# Patient Record
Sex: Female | Born: 1970 | Race: Black or African American | Hispanic: No | Marital: Married | Smoking: Never smoker
Health system: Southern US, Community
[De-identification: ages and names within clinical notes are randomized; demographics above are authoritative.]

## PROBLEM LIST (undated history)

## (undated) ENCOUNTER — Emergency Department (HOSPITAL_COMMUNITY): Payer: Managed Care, Other (non HMO)

## (undated) DIAGNOSIS — N921 Excessive and frequent menstruation with irregular cycle: Secondary | ICD-10-CM

## (undated) DIAGNOSIS — E119 Type 2 diabetes mellitus without complications: Secondary | ICD-10-CM

## (undated) DIAGNOSIS — I10 Essential (primary) hypertension: Secondary | ICD-10-CM

## (undated) DIAGNOSIS — I5189 Other ill-defined heart diseases: Secondary | ICD-10-CM

## (undated) DIAGNOSIS — I639 Cerebral infarction, unspecified: Secondary | ICD-10-CM

## (undated) DIAGNOSIS — D849 Immunodeficiency, unspecified: Secondary | ICD-10-CM

## (undated) HISTORY — DX: Excessive and frequent menstruation with irregular cycle: N92.1

## (undated) HISTORY — PX: NO PAST SURGERIES: SHX2092

---

## 2002-08-11 ENCOUNTER — Encounter: Admission: RE | Admit: 2002-08-11 | Discharge: 2002-11-09 | Payer: Self-pay | Admitting: Endocrinology

## 2002-11-13 ENCOUNTER — Emergency Department (HOSPITAL_COMMUNITY): Admission: EM | Admit: 2002-11-13 | Discharge: 2002-11-13 | Payer: Self-pay | Admitting: Emergency Medicine

## 2002-11-13 ENCOUNTER — Encounter: Payer: Self-pay | Admitting: Emergency Medicine

## 2004-08-06 ENCOUNTER — Encounter: Admission: RE | Admit: 2004-08-06 | Discharge: 2004-08-06 | Payer: Self-pay | Admitting: Internal Medicine

## 2004-08-31 ENCOUNTER — Other Ambulatory Visit: Admission: RE | Admit: 2004-08-31 | Discharge: 2004-08-31 | Payer: Self-pay | Admitting: Obstetrics and Gynecology

## 2007-04-21 ENCOUNTER — Ambulatory Visit (HOSPITAL_COMMUNITY): Admission: RE | Admit: 2007-04-21 | Discharge: 2007-04-21 | Payer: Self-pay | Admitting: Obstetrics and Gynecology

## 2008-08-08 ENCOUNTER — Ambulatory Visit (HOSPITAL_COMMUNITY): Admission: RE | Admit: 2008-08-08 | Discharge: 2008-08-08 | Payer: Self-pay | Admitting: Internal Medicine

## 2008-11-23 ENCOUNTER — Emergency Department (HOSPITAL_COMMUNITY): Admission: EM | Admit: 2008-11-23 | Discharge: 2008-11-23 | Payer: Self-pay | Admitting: Emergency Medicine

## 2011-12-12 ENCOUNTER — Emergency Department (HOSPITAL_BASED_OUTPATIENT_CLINIC_OR_DEPARTMENT_OTHER)
Admission: EM | Admit: 2011-12-12 | Discharge: 2011-12-12 | Disposition: A | Discharge status: Home / Self Care | Payer: Managed Care, Other (non HMO) | Attending: Emergency Medicine | Admitting: Emergency Medicine

## 2011-12-12 ENCOUNTER — Encounter (HOSPITAL_BASED_OUTPATIENT_CLINIC_OR_DEPARTMENT_OTHER): Payer: Self-pay | Admitting: *Deleted

## 2011-12-12 DIAGNOSIS — R739 Hyperglycemia, unspecified: Secondary | ICD-10-CM

## 2011-12-12 DIAGNOSIS — R7309 Other abnormal glucose: Secondary | ICD-10-CM | POA: Insufficient documentation

## 2011-12-12 DIAGNOSIS — E119 Type 2 diabetes mellitus without complications: Secondary | ICD-10-CM | POA: Insufficient documentation

## 2011-12-12 DIAGNOSIS — I10 Essential (primary) hypertension: Secondary | ICD-10-CM | POA: Insufficient documentation

## 2011-12-12 HISTORY — DX: Essential (primary) hypertension: I10

## 2011-12-12 HISTORY — DX: Type 2 diabetes mellitus without complications: E11.9

## 2011-12-12 LAB — URINALYSIS, ROUTINE W REFLEX MICROSCOPIC
Bilirubin Urine: NEGATIVE
Glucose, UA: >1000 mg/dL — AB
Hgb urine dipstick: NEGATIVE
Ketones, ur: NEGATIVE mg/dL
Nitrite: NEGATIVE
Protein, ur: NEGATIVE mg/dL
Specific Gravity, Urine: 1.015 (ref 1.005–1.030)
Urobilinogen, UA: 0.2 mg/dL (ref 0.0–1.0)
pH: 7 (ref 5.0–8.0)

## 2011-12-12 LAB — URINE MICROSCOPIC-ADD ON

## 2011-12-12 LAB — BASIC METABOLIC PANEL
BUN: 14 mg/dL (ref 6–23)
Chloride: 95 mEq/L — ABNORMAL LOW (ref 96–112)
Creatinine, Ser: 0.9 mg/dL (ref 0.50–1.10)
Glucose, Bld: 417 mg/dL — ABNORMAL HIGH (ref 70–99)
Potassium: 3.5 mEq/L (ref 3.5–5.1)

## 2011-12-12 MED ORDER — INSULIN ASPART PROT & ASPART (70-30 MIX) 100 UNIT/ML ~~LOC~~ SUSP
SUBCUTANEOUS | Status: AC
  Filled 2011-12-12: qty 3

## 2011-12-12 MED ORDER — METFORMIN HCL 1000 MG PO TABS: 1000.0000 mg | ORAL_TABLET | Freq: Two times a day (BID) | ORAL | Status: DC

## 2011-12-12 MED ORDER — INSULIN ASPART PROT & ASPART (70-30 MIX) 100 UNIT/ML ~~LOC~~ SUSP
7.0000 [IU] | Freq: Once | SUBCUTANEOUS | Status: AC
  Administered 2011-12-12: 7 [IU] via SUBCUTANEOUS

## 2011-12-12 MED ORDER — LISINOPRIL 20 MG PO TABS: 20.0000 mg | ORAL_TABLET | Freq: Every day | ORAL | Status: DC

## 2011-12-12 NOTE — ED Provider Notes (Signed)
History     CSN: [id number]  Arrival date & time 12/12/11  1024   First MD Initiated Contact with Patient 12/12/11 1124      Chief Complaint  Patient presents with  . Hypertension    (Consider location/radiation/quality/duration/timing/severity/associated sxs/prior treatment) Patient is a 41 y.o. female presenting with hypertension. The history is provided by the patient.  Hypertension  She has a history of hypertension and diabetes but stopped taking medications over a year ago. She has not been monitoring her blood pressure her blood sugar since then. She had her blood pressure taken at a routine screening today and it was 230/120. She denies headache or visual change or tinnitus.  Past Medical History  Diagnosis Date  . Diabetes mellitus without complication   . Hypertension     History reviewed. No pertinent past surgical history.  History reviewed. No pertinent family history.  History  Substance Use Topics  . Smoking status: Never Smoker   . Smokeless tobacco: Not on file  . Alcohol Use: No    OB History    Grav Para Term Preterm Abortions TAB SAB Ect Mult Living                  Review of Systems  All other systems reviewed and are negative.    Allergies  Review of patient's allergies indicates no known allergies.  Home Medications  No current outpatient prescriptions on file.  BP 200/138  Pulse 86  Temp 98.5 F (36.9 C) (Oral)  Resp 20  SpO2 100%  Physical Exam  Nursing note and vitals reviewed. 41 year old female, resting comfortably and in no acute distress. Vital signs are significant for severe hypertension with blood pressure 200/138. Oxygen saturation is 100%, which is normal. Head is normocephalic and atraumatic. PERRLA, EOMI. Oropharynx is clear. Fundi show no hemorrhage, exudate, or papilledema. Neck is nontender and supple without adenopathy or JVD. Back is nontender and there is no CVA tenderness. Lungs are clear without rales,  wheezes, or rhonchi. Chest is nontender. Heart has regular rate and rhythm. There is a 2/6 systolic ejection murmur present along the left sternal border. Abdomen is soft, flat, nontender without masses or hepatosplenomegaly and peristalsis is normoactive. Extremities have no cyanosis or edema, full range of motion is present. Skin is warm and dry without rash. Neurologic: Mental status is normal, cranial nerves are intact, there are no motor or sensory deficits.   ED Course  Procedures (including critical care time)  Results for orders placed during the hospital encounter of 12/12/11  URINALYSIS, ROUTINE W REFLEX MICROSCOPIC      Component Value Range   Color, Urine YELLOW  YELLOW   APPearance CLEAR  CLEAR   Specific Gravity, Urine 1.015  1.005 - 1.030   pH 7.0  5.0 - 8.0   Glucose, UA >1000 (*) NEGATIVE mg/dL   Hgb urine dipstick NEGATIVE  NEGATIVE   Bilirubin Urine NEGATIVE  NEGATIVE   Ketones, ur NEGATIVE  NEGATIVE mg/dL   Protein, ur NEGATIVE  NEGATIVE mg/dL   Urobilinogen, UA 0.2  0.0 - 1.0 mg/dL   Nitrite NEGATIVE  NEGATIVE   Leukocytes, UA SMALL (*) NEGATIVE  BASIC METABOLIC PANEL      Component Value Range   Sodium 132 (*) 135 - 145 mEq/L   Potassium 3.5  3.5 - 5.1 mEq/L   Chloride 95 (*) 96 - 112 mEq/L   CO2 23  19 - 32 mEq/L   Glucose, Bld 417 (*) 70 -  99 mg/dL   BUN 14  6 - 23 mg/dL   Creatinine, Ser 1.61  0.50 - 1.10 mg/dL   Calcium 9.4  8.4 - 09.6 mg/dL   GFR calc non Af Amer 77 (*) >90 mL/min   GFR calc Af Amer 89 (*) >90 mL/min  URINE MICROSCOPIC-ADD ON      Component Value Range   Squamous Epithelial / LPF RARE  RARE   WBC, UA 7-10  <3 WBC/hpf   RBC / HPF 3-6  <3 RBC/hpf   Bacteria, UA FEW (*) RARE   Urine-Other MUCOUS PRESENT      1. Hypertension   2. Hyperglycemia       MDM  Severe hypertension. Basic metabolic panel and urinalysis will be obtained to screen for signs of endorgan damage and I anticipate sending her home on lisinopril. If blood  sugar is significantly elevated, she'll also be started on oral hypoglycemics.  Blood sugars come back at over 400. She is given a dose of insulin and will be sent home with prescriptions for metformin and lisinopril.        Dione Booze, MD 12/12/11 1331

## 2011-12-12 NOTE — ED Notes (Signed)
Having health screening at work they sent her here after a blood pressure reading of 230/128 pt denies any pain or discomfort

## 2011-12-12 NOTE — ED Notes (Signed)
Pt states she has been on medication in the past for hypertension but decided she wanted " to try to something more natural" pt continues to deny headache blurred vision or any abnormal symptoms states she feels fine

## 2011-12-13 LAB — URINE CULTURE

## 2012-05-13 ENCOUNTER — Encounter: Payer: Self-pay | Admitting: Obstetrics and Gynecology

## 2012-05-13 DIAGNOSIS — I10 Essential (primary) hypertension: Secondary | ICD-10-CM | POA: Insufficient documentation

## 2012-05-13 DIAGNOSIS — E119 Type 2 diabetes mellitus without complications: Secondary | ICD-10-CM | POA: Insufficient documentation

## 2013-02-11 ENCOUNTER — Other Ambulatory Visit: Payer: Self-pay | Admitting: Obstetrics and Gynecology

## 2013-02-11 DIAGNOSIS — Z1231 Encounter for screening mammogram for malignant neoplasm of breast: Secondary | ICD-10-CM

## 2013-03-04 ENCOUNTER — Ambulatory Visit: Payer: Managed Care, Other (non HMO)

## 2014-06-22 ENCOUNTER — Inpatient Hospital Stay (HOSPITAL_COMMUNITY)
Admission: EM | Admit: 2014-06-22 | Discharge: 2014-06-22 | DRG: 066 | Disposition: A | Discharge status: Other Acute Inpatient Hospital | Payer: 59 | Attending: Neurology | Admitting: Neurology

## 2014-06-22 ENCOUNTER — Emergency Department (HOSPITAL_COMMUNITY): Admission: EM | Admit: 2014-06-22 | Payer: Managed Care, Other (non HMO) | Source: Home / Self Care

## 2014-06-22 ENCOUNTER — Emergency Department (HOSPITAL_COMMUNITY): Payer: 59

## 2014-06-22 ENCOUNTER — Encounter (HOSPITAL_COMMUNITY): Payer: Self-pay | Admitting: *Deleted

## 2014-06-22 ENCOUNTER — Encounter (HOSPITAL_COMMUNITY): Payer: Self-pay | Admitting: Neurology

## 2014-06-22 ENCOUNTER — Inpatient Hospital Stay (HOSPITAL_COMMUNITY)
Admission: EM | Admit: 2014-06-22 | Discharge: 2014-06-24 | Disposition: A | Discharge status: Home / Self Care | Payer: 59 | Source: Home / Self Care | Attending: Neurology | Admitting: Neurology

## 2014-06-22 DIAGNOSIS — I10 Essential (primary) hypertension: Secondary | ICD-10-CM | POA: Diagnosis present

## 2014-06-22 DIAGNOSIS — I629 Nontraumatic intracranial hemorrhage, unspecified: Secondary | ICD-10-CM | POA: Diagnosis not present

## 2014-06-22 DIAGNOSIS — E119 Type 2 diabetes mellitus without complications: Secondary | ICD-10-CM | POA: Diagnosis present

## 2014-06-22 DIAGNOSIS — R531 Weakness: Secondary | ICD-10-CM | POA: Diagnosis present

## 2014-06-22 DIAGNOSIS — I619 Nontraumatic intracerebral hemorrhage, unspecified: Secondary | ICD-10-CM | POA: Diagnosis not present

## 2014-06-22 DIAGNOSIS — D72829 Elevated white blood cell count, unspecified: Secondary | ICD-10-CM | POA: Diagnosis not present

## 2014-06-22 DIAGNOSIS — I610 Nontraumatic intracerebral hemorrhage in hemisphere, subcortical: Secondary | ICD-10-CM | POA: Diagnosis not present

## 2014-06-22 DIAGNOSIS — R29810 Facial weakness: Secondary | ICD-10-CM | POA: Diagnosis present

## 2014-06-22 DIAGNOSIS — E1159 Type 2 diabetes mellitus with other circulatory complications: Secondary | ICD-10-CM | POA: Diagnosis not present

## 2014-06-22 DIAGNOSIS — I6789 Other cerebrovascular disease: Secondary | ICD-10-CM | POA: Diagnosis not present

## 2014-06-22 HISTORY — DX: Type 2 diabetes mellitus without complications: E11.9

## 2014-06-22 HISTORY — DX: Immunodeficiency, unspecified: D84.9

## 2014-06-22 LAB — COMPREHENSIVE METABOLIC PANEL
ALBUMIN: 4.2 g/dL (ref 3.5–5.0)
ALK PHOS: 99 U/L (ref 38–126)
ALT: 23 U/L (ref 14–54)
AST: 32 U/L (ref 15–41)
Anion gap: 10 (ref 5–15)
BUN: 29 mg/dL — ABNORMAL HIGH (ref 6–20)
CO2: 23 mmol/L (ref 22–32)
Calcium: 9.2 mg/dL (ref 8.9–10.3)
Chloride: 104 mmol/L (ref 101–111)
Creatinine, Ser: 1.46 mg/dL — ABNORMAL HIGH (ref 0.44–1.00)
GFR calc Af Amer: 49 mL/min — ABNORMAL LOW (ref >60)
GFR calc non Af Amer: 42 mL/min — ABNORMAL LOW (ref >60)
Glucose, Bld: 178 mg/dL — ABNORMAL HIGH (ref 65–99)
Potassium: 3.9 mmol/L (ref 3.5–5.1)
Sodium: 137 mmol/L (ref 135–145)
TOTAL PROTEIN: 7.7 g/dL (ref 6.5–8.1)
Total Bilirubin: 0.7 mg/dL (ref 0.3–1.2)

## 2014-06-22 LAB — PROTIME-INR
INR: 1.13 (ref 0.00–1.49)
Prothrombin Time: 14.7 seconds (ref 11.6–15.2)

## 2014-06-22 LAB — I-STAT CHEM 8, ED
BUN: 36 mg/dL — AB (ref 6–20)
CALCIUM ION: 1.12 mmol/L (ref 1.12–1.23)
CHLORIDE: 104 mmol/L (ref 101–111)
Creatinine, Ser: 1.5 mg/dL — ABNORMAL HIGH (ref 0.44–1.00)
GLUCOSE: 183 mg/dL — AB (ref 65–99)
HEMATOCRIT: 40 % (ref 36.0–46.0)
HEMOGLOBIN: 13.6 g/dL (ref 12.0–15.0)
Potassium: 3.9 mmol/L (ref 3.5–5.1)
Sodium: 138 mmol/L (ref 135–145)
TCO2: 22 mmol/L (ref 0–100)

## 2014-06-22 LAB — DIFFERENTIAL
Basophils Absolute: 0 10*3/uL (ref 0.0–0.1)
Basophils Relative: 0 % (ref 0–1)
Eosinophils Absolute: 0.3 10*3/uL (ref 0.0–0.7)
Eosinophils Relative: 2 % (ref 0–5)
LYMPHS ABS: 1.7 10*3/uL (ref 0.7–4.0)
LYMPHS PCT: 15 % (ref 12–46)
Monocytes Absolute: 0.5 10*3/uL (ref 0.1–1.0)
Monocytes Relative: 5 % (ref 3–12)
NEUTROS PCT: 78 % — AB (ref 43–77)
Neutro Abs: 8.8 10*3/uL — ABNORMAL HIGH (ref 1.7–7.7)

## 2014-06-22 LAB — CBC
HEMATOCRIT: 36.8 % (ref 36.0–46.0)
Hemoglobin: 11.9 g/dL — ABNORMAL LOW (ref 12.0–15.0)
MCH: 26.7 pg (ref 26.0–34.0)
MCHC: 32.3 g/dL (ref 30.0–36.0)
MCV: 82.5 fL (ref 78.0–100.0)
Platelets: 183 10*3/uL (ref 150–400)
RBC: 4.46 MIL/uL (ref 3.87–5.11)
RDW: 14 % (ref 11.5–15.5)
WBC: 11.3 10*3/uL — ABNORMAL HIGH (ref 4.0–10.5)

## 2014-06-22 LAB — I-STAT TROPONIN, ED: Troponin i, poc: 0.01 ng/mL (ref 0.00–0.08)

## 2014-06-22 LAB — APTT: aPTT: 27 seconds (ref 24–37)

## 2014-06-22 LAB — CBG MONITORING, ED: Glucose-Capillary: 163 mg/dL — ABNORMAL HIGH (ref 65–99)

## 2014-06-22 MED ORDER — NICARDIPINE HCL IN NACL 20-0.86 MG/200ML-% IV SOLN
3.0000 mg/h | Freq: Once | INTRAVENOUS | Status: DC
  Filled 2014-06-22: qty 200

## 2014-06-22 MED ORDER — NICARDIPINE HCL IN NACL 20-0.86 MG/200ML-% IV SOLN: 5.0000 mg/h | Freq: Once | INTRAVENOUS | Status: DC

## 2014-06-22 NOTE — ED Provider Notes (Signed)
CSN: [id number]     Arrival date & time 06/22/14  2230 History  This chart was scribed for Stephanie CrumbleAdeleke Osborne Serio, MD by Annye AsaAnna Dorsett, ED Scribe. This patient was seen in room A01C/A01C and the patient's care was started at 11:01 PM.    No chief complaint on file.  The history is provided by the patient and the spouse. No language interpreter was used.     HPI Comments: Anthoney HaradaMarba Lloyd is a 44 y.o. female with past medical history of HTN, DM who presents to the Emergency Department complaining of generalized weakness beginning tonight around 20:30. Patient's husband reports patient became suddenly weak (L>R) with slurred speech and right-sided facial droop. Patient explains she became lightheaded with paresthesias in her left hand. She originally believed her symptoms might be due to her blood sugar, but patient was unable to swallow appropriately when they attempted to give food or drink to improve this issue. She denies any numbness.   Patient [facility] here tonight [facility]; she completed a CT scan there before being transferred to [facility] ED via [facility].   Past Medical History  Diagnosis Date  . Hypertension   . Immune deficiency disorder    No past surgical history on file. No family history on file. History  Substance Use Topics  . Smoking status: Never Smoker   . Smokeless tobacco: Not on file  . Alcohol Use: Yes     Comment: occ   OB History    No data available     Review of Systems  HENT: Positive for trouble swallowing.   Neurological: Positive for speech difficulty, weakness and light-headedness. Negative for numbness.  All other systems reviewed and are negative.  Allergies  Review of patient's allergies indicates no known allergies.  Home Medications   Prior to Admission medications   Medication Sig Start Date End Date Taking? Authorizing Provider  Pseudoephedrine-Ibuprofen 30-200 MG TABS Take 2 tablets by mouth every 4 (four) hours as needed (sinus).     Historical Provider, MD   There were no vitals taken for this visit. Physical Exam  Constitutional: She is oriented to person, place, and time. She appears well-developed and well-nourished. No distress.  HENT:  Head: Normocephalic and atraumatic.  Mouth/Throat: Oropharynx is clear and moist. No oropharyngeal exudate.  Moist mucous membranes  Eyes: EOM are normal. Pupils are equal, round, and reactive to light.  Neck: Normal range of motion. Neck supple. No JVD present.  Cardiovascular: Regular rhythm.  Tachycardia present.  Exam reveals no gallop and no friction rub.   Murmur heard.  Systolic murmur is present  Pulmonary/Chest: Effort normal and breath sounds normal. No respiratory distress. She has no wheezes. She has no rales.  Abdominal: Soft. Bowel sounds are normal. She exhibits no mass. There is no tenderness. There is no rebound and no guarding.  Musculoskeletal: Normal range of motion. She exhibits no edema.  Moves all extremities normally.   Lymphadenopathy:    She has no cervical adenopathy.  Neurological: She is alert and oriented to person, place, and time.  Right-sided facial droop; 3/5 strengh in LUE, 4/5 strength in LLE, 5/5 in both right-sided extremities  Skin: Skin is warm and dry. No rash noted.  Psychiatric: She has a normal mood and affect. Her behavior is normal.  Nursing note and vitals reviewed.   ED Course  Procedures   DIAGNOSTIC STUDIES: Oxygen Saturation is 100% on [place] 5L/min, normal by my interpretation.    COORDINATION OF CARE: 11:08  PM Discussed treatment plan with patient and spouse at bedside and both agreed to plan.  Labs Review Labs Reviewed - No data to display  Imaging Review Ct Head (brain) Wo Contrast  06/22/2014   CLINICAL DATA:  Left-sided facial droop. Extremity weakness. History of hypertension. Code stroke.  EXAM: CT HEAD WITHOUT CONTRAST  TECHNIQUE: Contiguous axial images were obtained from the base of the skull through the vertex  without intravenous contrast.  COMPARISON:  None.  FINDINGS: There is an approximately 1.2 x 1.5 cm hemorrhage located within the right thalamus (image 13, series 2). No definite intraventricular extension. No significant adjacent mass effect. No midline shift.  Age advanced periventricular and bilateral basal ganglial hypodensities compatible with microvascular ischemic disease. Age advanced atrophy with mild diffuse sulcal prominence. Normal size and configuration of the ventricles and basilar cisterns. Limited visualization the paranasal sinuses and mastoid air cells is normal. No air-fluid levels. Regional soft tissues appear normal. No radiopaque foreign body. A prominent dystrophic calcification is noted about the inner table of the right frontal calvarium. No displaced calvarial fracture.  IMPRESSION: 1. Acute approximately 1.5 cm right thalamic intraparenchymal hemorrhage without intraventricular extension or significant mass effect. While potentially hypertensive in etiology, given extensive age advanced microvascular ischemic disease, hemorrhagic conversion of a microvascular infarct could have a similar appearance. As such, further evaluation with brain MRI is recommended.  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 Critical Value/emergent results were called by telephone at the time of interpretation on 06/22/2014 at 10:12 pm to Dr. Fredderick Phenix, who verbally acknowledged these results.   Electronically Signed   By: Simonne Come M.D.   On: 06/22/2014 22:21     EKG Interpretation None      MDM   Final diagnoses:  None   Patient since emergency department via transfer from [facility]. She was diagnosed with hemorrhagic stroke there. There was miscommunication and the patient was supposed to have an ICU bed upon arrival. This bed was not available, patient was transferred back down to the emergency department. I evaluated the patient and she continues to have symptoms of CVA. Dr.  Thad Ranger is also at the bedside. Cardene drip is running at 2.5 with a goal systolic blood pressure of 160. She is currently requiring supportive oxygenation to keep her sats greater than 95%.  Dr. Thad Ranger is assumed care of the patient, she will be transferred up to the ICU.  I personally performed the services described in this documentation, which was scribed in my presence. The recorded information has been reviewed and is accurate.    Stephanie Crumble, MD 06/22/14 2352

## 2014-06-22 NOTE — ED Notes (Signed)
Pt states that at around 2030 she began to have left sided weakness and facial droop left arm weakness with drift

## 2014-06-22 NOTE — ED Notes (Signed)
Pt taken to CT scan, upon completion [facility] here to take pt.

## 2014-06-22 NOTE — ED Notes (Signed)
Patient placed on 4.5L patient o2 85% . Patient then placed on 5L patient o2 87% 2254 patient then placed. On  Non- reb patient 98%. Patient Cardene stopped at 2254 due 138/78 Verbal given by MD Oni Cardene 2.5 Patient Pressure 178/97. Goal 160. 2323 MD reynolds verbal order to leave patient's BP @ 180. Verbal order 2329 MD reynolds Cardene @ 5 BP 209/115 patient taken off Non- breather and placed on 4L. Verbal MD reynolds patient pressure okay at 150.

## 2014-06-22 NOTE — H&P (Signed)
Admission H&P    Chief Complaint: Left sided weakness  HPI: Stephanie Lloyd is an 44 y.o. female who reports that she was at work this evening and had the acute onset of left sided weakness.  Her husband noted slurred speech as well.  Initially it was felt that her symptoms were due to blood sugar but when they attempted to give her some food she had difficulty swallowing the food.  The patient was brought to [facility] at that time and a code stroke was called.  BP was elevated at 255/136.   Initial NIHSS of 4.    Date last known well: Date: 06/22/2014 Time last known well: Time: 20:30 tPA Given: No: ICH  ICH Score: 0  Past Medical History  Diagnosis Date  . Hypertension   . Immune deficiency disorder   . Diabetes     No past surgical history on file.  Family history: Father alive with diabetes and CAD.  Has had a MI and stroke in the past.  Mother and siblings alive and well.    Social History:  reports that she has never smoked. She does not have any smokeless tobacco history on file. She reports that she drinks alcohol. She reports that she does not use illicit drugs.  Allergies: No Known Allergies  Medications: None (patient non-compliant with medications for diabetes and hypertension-has not taken in years)  ROS: History obtained from the patient  General ROS: negative for - chills, fatigue, fever, night sweats, weight gain or weight loss Psychological ROS: negative for - behavioral disorder, hallucinations, memory difficulties, mood swings or suicidal ideation Ophthalmic ROS: decreased vision in the right eye for the past week ENT ROS: negative for - epistaxis, nasal discharge, oral lesions, sore throat, tinnitus or vertigo Allergy and Immunology ROS: negative for - hives or itchy/watery eyes Hematological and Lymphatic ROS: negative for - bleeding problems, bruising or swollen lymph nodes Endocrine ROS: negative for - galactorrhea, hair pattern changes, polydipsia/polyuria or  temperature intolerance Respiratory ROS: negative for - cough, hemoptysis, shortness of breath or wheezing Cardiovascular ROS: negative for - chest pain, dyspnea on exertion, edema or irregular heartbeat Gastrointestinal ROS: negative for - abdominal pain, diarrhea, hematemesis, nausea/vomiting or stool incontinence Genito-Urinary ROS: negative for - dysuria, hematuria, incontinence or urinary frequency/urgency Musculoskeletal ROS: negative for - joint swelling or muscular weakness Neurological ROS: as noted in HPI Dermatological ROS: negative for rash and skin lesion changes  Physical Examination: Blood pressure 158/87, pulse 115, resp. rate 22, SpO2 97 %.  General Examination: HEENT-  Normocephalic, no lesions, without obvious abnormality.  Normal external eye and conjunctiva.  Normal TM's bilaterally.  Normal auditory canals and external ears. Normal external nose, mucus membranes and septum.  Normal pharynx. Cardiovascular- S1, S2 normal, pulses palpable throughout   Lungs- chest clear, no wheezing, rales, normal symmetric air entry Abdomen- soft, non-tender; bowel sounds normal; no masses,  no organomegaly Extremities- no edema Lymph-no adenopathy palpable Musculoskeletal-no joint tenderness, deformity or swelling Skin-warm and dry, no hyperpigmentation, vitiligo, or suspicious lesions  Neurological Examination Mental Status: Alert, oriented, thought content appropriate.  Speech fluent without evidence of aphasia.  Speech slurred.  Able to follow 3 step commands without difficulty. Cranial Nerves: II: Discs flat bilaterally; Visual fields grossly normal, pupils equal, round, reactive to light and accommodation III,IV, VI: ptosis not present, extra-ocular motions intact bilaterally V,VII: left facial droop, facial light touch sensation normal bilaterally VIII: hearing normal bilaterally IX,X: gag reflex present XI: bilateral shoulder shrug XII: midline tongue  extension Motor: Right : Upper extremity   5/5    Left:     Upper extremity   4-/5  Lower extremity   5/5     Lower extremity   4+/5 Tone and bulk:normal tone throughout; no atrophy noted Sensory: Pinprick and light touch intact throughout, bilaterally Deep Tendon Reflexes: 1+ and symmetric throughout Plantars: Right: downgoing   Left: downgoing Cerebellar: normal finger-to-nose and normal heel-to-shin testing bilaterally Gait: not tested due to safety concerns   Laboratory Studies:   Basic Metabolic Panel:  Recent Labs Lab 06/22/14 2201 06/22/14 2206  NA 137 138  K 3.9 3.9  CL 104 104  CO2 23  --   GLUCOSE 178* 183*  BUN 29* 36*  CREATININE 1.46* 1.50*  CALCIUM 9.2  --     Liver Function Tests:  Recent Labs Lab 06/22/14 2201  AST 32  ALT 23  ALKPHOS 99  BILITOT 0.7  PROT 7.7  ALBUMIN 4.2   No results for input(s): LIPASE, AMYLASE in the last 168 hours. No results for input(s): AMMONIA in the last 168 hours.  CBC:  Recent Labs Lab 06/22/14 2201 06/22/14 2206  WBC 11.3*  --   NEUTROABS 8.8*  --   HGB 11.9* 13.6  HCT 36.8 40.0  MCV 82.5  --   PLT 183  --     Cardiac Enzymes: No results for input(s): CKTOTAL, CKMB, CKMBINDEX, TROPONINI in the last 168 hours.  BNP: Invalid input(s): POCBNP  CBG:  Recent Labs Lab 06/22/14 2155  GLUCAP 163*    Microbiology: No results found for this or any previous visit.  Coagulation Studies:  Recent Labs  06/22/14 2201  LABPROT 14.7  INR 1.13    Urinalysis: No results for input(s): COLORURINE, LABSPEC, PHURINE, GLUCOSEU, HGBUR, BILIRUBINUR, KETONESUR, PROTEINUR, UROBILINOGEN, NITRITE, LEUKOCYTESUR in the last 168 hours.  Invalid input(s): APPERANCEUR  Lipid Panel:  No results found for: CHOL, TRIG, HDL, CHOLHDL, VLDL, LDLCALC  HgbA1C: No results found for: HGBA1C  Urine Drug Screen:  No results found for: LABOPIA, COCAINSCRNUR, LABBENZ, AMPHETMU, THCU, LABBARB  Alcohol Level: No results for  input(s): ETH in the last 168 hours.  Other results: EKG: sinus rhythm at 89 bpm, biatrial enlargement.  Imaging: Ct Head (brain) Wo Contrast  06/22/2014   CLINICAL DATA:  Left-sided facial droop. Extremity weakness. History of hypertension. Code stroke.  EXAM: CT HEAD WITHOUT CONTRAST  TECHNIQUE: Contiguous axial images were obtained from the base of the skull through the vertex without intravenous contrast.  COMPARISON:  None.  FINDINGS: There is an approximately 1.2 x 1.5 cm hemorrhage located within the right thalamus (image 13, series 2). No definite intraventricular extension. No significant adjacent mass effect. No midline shift.  Age advanced periventricular and bilateral basal ganglial hypodensities compatible with microvascular ischemic disease. Age advanced atrophy with mild diffuse sulcal prominence. Normal size and configuration of the ventricles and basilar cisterns. Limited visualization the paranasal sinuses and mastoid air cells is normal. No air-fluid levels. Regional soft tissues appear normal. No radiopaque foreign body. A prominent dystrophic calcification is noted about the inner table of the right frontal calvarium. No displaced calvarial fracture.  IMPRESSION: 1. Acute approximately 1.5 cm right thalamic intraparenchymal hemorrhage without intraventricular extension or significant mass effect. While potentially hypertensive in etiology, given extensive age advanced microvascular ischemic disease, hemorrhagic conversion of a microvascular infarct could have a similar appearance. As such, further evaluation with brain MRI is recommended.  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 Critical Value/emergent  results were called by telephone at the time of interpretation on 06/22/2014 at 10:12 pm to Dr. Fredderick Phenix, who verbally acknowledged these results.   Electronically Signed   By: Simonne Come M.D.   On: 06/22/2014 22:21    Assessment: 44 y.o. female presenting with acute onset  left sided weakness and slurred speech.  BP elevated.  Patient with a history of diabetes and hypertension, non-compliant with medications.  Head CT personally reviewed and shows a left thalamic intracerebral hemorrhage, likely secondary to hypertension.  Further work up recommended.  Stroke Risk Factors - diabetes mellitus and hypertension  Plan: 1. HgbA1c, fasting lipid panel 2. MRI, MRA  of the brain without contrast 3. PT consult, OT consult, Speech consult 4. Echocardiogram 5. Carotid dopplers 6. Prophylactic therapy-None 7. NPO until RN stroke swallow screen 8. Telemetry monitoring 9. Frequent neuro checks 10. Cardene to be started for BP control 11. Sliding scale insulin 12. Compliance with medication stressed 12. Admit to NICU  This patient is critically ill and at significant risk of neurological worsening, death and care requires constant monitoring of vital signs, hemodynamics,respiratory and cardiac monitoring, neurological assessment, discussion with family, other specialists and medical decision making of high complexity. I spent 60 minutes of neurocritical care time  in the care of  this patient.  Thana Farr, MD [facility] [phone] 06/23/2014  12:09 AM

## 2014-06-23 ENCOUNTER — Inpatient Hospital Stay (HOSPITAL_COMMUNITY): Payer: 59

## 2014-06-23 ENCOUNTER — Encounter (HOSPITAL_COMMUNITY): Payer: Self-pay | Admitting: *Deleted

## 2014-06-23 DIAGNOSIS — I6789 Other cerebrovascular disease: Secondary | ICD-10-CM

## 2014-06-23 DIAGNOSIS — I610 Nontraumatic intracerebral hemorrhage in hemisphere, subcortical: Secondary | ICD-10-CM

## 2014-06-23 DIAGNOSIS — I10 Essential (primary) hypertension: Secondary | ICD-10-CM

## 2014-06-23 DIAGNOSIS — D72829 Elevated white blood cell count, unspecified: Secondary | ICD-10-CM

## 2014-06-23 DIAGNOSIS — E1159 Type 2 diabetes mellitus with other circulatory complications: Secondary | ICD-10-CM

## 2014-06-23 LAB — BASIC METABOLIC PANEL
Anion gap: 10 (ref 5–15)
BUN: 25 mg/dL — ABNORMAL HIGH (ref 6–20)
CALCIUM: 8.7 mg/dL — AB (ref 8.9–10.3)
CO2: 20 mmol/L — ABNORMAL LOW (ref 22–32)
Chloride: 105 mmol/L (ref 101–111)
Creatinine, Ser: 1.26 mg/dL — ABNORMAL HIGH (ref 0.44–1.00)
GFR calc Af Amer: 58 mL/min — ABNORMAL LOW (ref >60)
GFR, EST NON AFRICAN AMERICAN: 50 mL/min — AB (ref >60)
GLUCOSE: 320 mg/dL — AB (ref 65–99)
POTASSIUM: 3.3 mmol/L — AB (ref 3.5–5.1)
Sodium: 135 mmol/L (ref 135–145)

## 2014-06-23 LAB — CBC
HEMATOCRIT: 35.1 % — AB (ref 36.0–46.0)
Hemoglobin: 11.7 g/dL — ABNORMAL LOW (ref 12.0–15.0)
MCH: 26.7 pg (ref 26.0–34.0)
MCHC: 33.3 g/dL (ref 30.0–36.0)
MCV: 80 fL (ref 78.0–100.0)
Platelets: 211 10*3/uL (ref 150–400)
RBC: 4.39 MIL/uL (ref 3.87–5.11)
RDW: 13.9 % (ref 11.5–15.5)
WBC: 16.5 10*3/uL — ABNORMAL HIGH (ref 4.0–10.5)

## 2014-06-23 LAB — LIPID PANEL
CHOL/HDL RATIO: 3.1 ratio
CHOLESTEROL: 220 mg/dL — AB (ref 0–200)
HDL: 72 mg/dL (ref >40)
LDL Cholesterol: 140 mg/dL — ABNORMAL HIGH (ref 0–99)
Triglycerides: 41 mg/dL (ref <150)
VLDL: 8 mg/dL (ref 0–40)

## 2014-06-23 LAB — GLUCOSE, CAPILLARY
GLUCOSE-CAPILLARY: 263 mg/dL — AB (ref 65–99)
GLUCOSE-CAPILLARY: 196 mg/dL — AB (ref 65–99)
GLUCOSE-CAPILLARY: 239 mg/dL — AB (ref 65–99)
Glucose-Capillary: 291 mg/dL — ABNORMAL HIGH (ref 65–99)
Glucose-Capillary: 167 mg/dL — ABNORMAL HIGH (ref 65–99)
Glucose-Capillary: 95 mg/dL (ref 65–99)

## 2014-06-23 LAB — TSH: TSH: 0.548 u[IU]/mL (ref 0.350–4.500)

## 2014-06-23 LAB — MRSA PCR SCREENING: MRSA by PCR: NEGATIVE

## 2014-06-23 MED ORDER — INSULIN ASPART 100 UNIT/ML ~~LOC~~ SOLN: 0.0000 [IU] | Freq: Three times a day (TID) | SUBCUTANEOUS | Status: DC

## 2014-06-23 MED ORDER — METOPROLOL TARTRATE 25 MG PO TABS
25.0000 mg | ORAL_TABLET | Freq: Two times a day (BID) | ORAL | Status: DC
  Administered 2014-06-23 (×2): 25 mg via ORAL
  Filled 2014-06-23 (×3): qty 1

## 2014-06-23 MED ORDER — PANTOPRAZOLE SODIUM 40 MG PO TBEC
40.0000 mg | DELAYED_RELEASE_TABLET | Freq: Every day | ORAL | Status: DC
  Administered 2014-06-23 – 2014-06-24 (×2): 40 mg via ORAL
  Filled 2014-06-23 (×2): qty 1

## 2014-06-23 MED ORDER — NICARDIPINE HCL IN NACL 20-0.86 MG/200ML-% IV SOLN
3.0000 mg/h | INTRAVENOUS | Status: DC
  Administered 2014-06-23: 5 mg/h via INTRAVENOUS
  Administered 2014-06-23: 10 mg/h via INTRAVENOUS
  Administered 2014-06-23: 5 mg/h via INTRAVENOUS
  Filled 2014-06-23 (×2): qty 200

## 2014-06-23 MED ORDER — AMLODIPINE BESYLATE 10 MG PO TABS
10.0000 mg | ORAL_TABLET | Freq: Every day | ORAL | Status: DC
  Administered 2014-06-23 – 2014-06-24 (×2): 10 mg via ORAL
  Filled 2014-06-23 (×2): qty 1

## 2014-06-23 MED ORDER — LIDOCAINE HCL (PF) 1 % IJ SOLN
INTRAMUSCULAR | Status: AC
  Filled 2014-06-23: qty 30

## 2014-06-23 MED ORDER — INSULIN ASPART 100 UNIT/ML ~~LOC~~ SOLN
0.0000 [IU] | Freq: Three times a day (TID) | SUBCUTANEOUS | Status: DC
  Administered 2014-06-23: 3 [IU] via SUBCUTANEOUS
  Administered 2014-06-23: 8 [IU] via SUBCUTANEOUS
  Administered 2014-06-23: 3 [IU] via SUBCUTANEOUS

## 2014-06-23 MED ORDER — ACETAMINOPHEN 325 MG PO TABS: 650.0000 mg | ORAL_TABLET | ORAL | Status: DC | PRN

## 2014-06-23 MED ORDER — STROKE: EARLY STAGES OF RECOVERY BOOK
Freq: Once | Status: AC
  Administered 2014-06-23
  Filled 2014-06-23: qty 1

## 2014-06-23 MED ORDER — PANTOPRAZOLE SODIUM 40 MG IV SOLR
40.0000 mg | Freq: Every day | INTRAVENOUS | Status: DC
  Administered 2014-06-23: 40 mg via INTRAVENOUS
  Filled 2014-06-23 (×2): qty 40

## 2014-06-23 MED ORDER — HEPARIN SODIUM (PORCINE) 5000 UNIT/ML IJ SOLN
5000.0000 [IU] | Freq: Three times a day (TID) | INTRAMUSCULAR | Status: DC
  Administered 2014-06-23 (×2): 5000 [IU] via SUBCUTANEOUS
  Filled 2014-06-23 (×3): qty 1

## 2014-06-23 MED ORDER — ATORVASTATIN CALCIUM 40 MG PO TABS
40.0000 mg | ORAL_TABLET | Freq: Every day | ORAL | Status: DC
  Administered 2014-06-23: 40 mg via ORAL
  Filled 2014-06-23 (×2): qty 1

## 2014-06-23 MED ORDER — SENNOSIDES-DOCUSATE SODIUM 8.6-50 MG PO TABS
1.0000 | ORAL_TABLET | Freq: Two times a day (BID) | ORAL | Status: DC
  Administered 2014-06-23 – 2014-06-24 (×3): 1 via ORAL
  Filled 2014-06-23 (×4): qty 1

## 2014-06-23 MED ORDER — INSULIN ASPART 100 UNIT/ML ~~LOC~~ SOLN
0.0000 [IU] | Freq: Every day | SUBCUTANEOUS | Status: DC
  Administered 2014-06-23: 2 [IU] via SUBCUTANEOUS

## 2014-06-23 MED ORDER — ACETAMINOPHEN 650 MG RE SUPP: 650.0000 mg | RECTAL | Status: DC | PRN

## 2014-06-23 MED ORDER — INSULIN ASPART 100 UNIT/ML ~~LOC~~ SOLN
0.0000 [IU] | Freq: Three times a day (TID) | SUBCUTANEOUS | Status: DC
  Administered 2014-06-24 (×2): 3 [IU] via SUBCUTANEOUS

## 2014-06-23 NOTE — Progress Notes (Signed)
Inpatient Diabetes Program Recommendations  AACE/ADA: New Consensus Statement on Inpatient Glycemic Control (2013)  Target Ranges:  Prepandial:   less than 140 mg/dL      Peak postprandial:   less than 180 mg/dL (1-2 hours)      Critically ill patients:  140 - 180 mg/dL   Review of Glycemic Control:Results for Gypsy BalsamWARNSLEY, Darcy (MRN [id number]) as of 06/23/2014 11:46  Ref. Range 06/22/2014 21:55 06/23/2014 01:13 06/23/2014 08:11  Glucose-Capillary Latest Ref Range: 65-99 mg/dL 413163 (H) 244263 (H) 010291 (H)    Diabetes history: Diabetes listed however no diabetes medications listed.    A1C ordered and pending.  May consider adding basal insulin due to elevated blood sugars while in the hospital.  Consider adding Levemir 20 units daily to meet basal insulin needs.  Will follow-up on A1C results.  Thanks, Beryl MeagerJenny Evangaline Jou, RN, BC-ADM Inpatient Diabetes Coordinator Pager [phone] (8a-5p)

## 2014-06-23 NOTE — Progress Notes (Addendum)
[facility] Physician-Brief Progress Note Patient Name: Stephanie HaradaMarba Lloyd DOB: 03/19/1970 MRN: [id number]   Date of Service  06/23/2014  HPI/Events of Note  44 yo female with PMH DM and HTN. Admitted to ICU with L Thalamic ICH and HTN (BP = 255/136). ICH felt to be related to HTN. Currently BP controlled on a Nicardipine IV infusion. BP =  138/70 and HR = 119. Currently on [place] O2 with sat = 95% and RR = 26. Being managed by Neurology.   eICU Interventions  Continue current management.     Intervention Category Evaluation Type: New Patient Evaluation  Lenell AntuSommer,Steven Eugene 06/23/2014, 12:15 AM

## 2014-06-23 NOTE — ED Provider Notes (Signed)
CSN: [id number]     Arrival date & time 06/22/14  2143 History   First MD Initiated Contact with Patient 06/22/14 2210     Chief Complaint  Patient presents with  . Code Stroke     (Consider location/radiation/quality/duration/timing/severity/associated sxs/prior Treatment) HPI Comments: Patient presents with left-sided weakness and facial drooping. She states that at 8:30 this evening she noticed onset drooping of the left side of her face with weakness in her left arm and left leg. She attempted to walk out to the car with her husband and noticed that she was having trouble walking although she was able to ambulate. She knows that she's having some slurring of her speech. She denies any headache. She does have a history of hypertension and diabetes. She denies any past history of strokes. She denies any recent head trauma. Her symptoms of been constant and unchanged since they started.   Past Medical History  Diagnosis Date  . Hypertension   . Immune deficiency disorder   . Diabetes    History reviewed. No pertinent past surgical history. No family history on file. History  Substance Use Topics  . Smoking status: Never Smoker   . Smokeless tobacco: Not on file  . Alcohol Use: Yes     Comment: occ   OB History    No data available     Review of Systems  Constitutional: Negative for fever, chills, diaphoresis and fatigue.  HENT: Negative for congestion, rhinorrhea and sneezing.   Eyes: Negative.   Respiratory: Negative for cough, chest tightness and shortness of breath.   Cardiovascular: Negative for chest pain and leg swelling.  Gastrointestinal: Negative for nausea, vomiting, abdominal pain, diarrhea and blood in stool.  Genitourinary: Negative for frequency, hematuria, flank pain and difficulty urinating.  Musculoskeletal: Negative for back pain and arthralgias.  Skin: Negative for rash.  Neurological: Positive for speech difficulty, weakness and numbness. Negative for  dizziness and headaches.      Allergies  Review of patient's allergies indicates no known allergies.  Home Medications   Prior to Admission medications   Medication Sig Start Date End Date Taking? Authorizing Provider  Pseudoephedrine-Ibuprofen 30-200 MG TABS Take 2 tablets by mouth every 4 (four) hours as needed (sinus).   Yes Historical Provider, MD   BP 255/136 mmHg  Pulse 86  Resp 18  Ht  (1.753 m)  Wt 200 lb (90.719 kg)  BMI 29.52 kg/m2  SpO2 100% Physical Exam  Constitutional: She is oriented to person, place, and time. She appears well-developed and well-nourished.  HENT:  Head: Normocephalic and atraumatic.  Eyes: Pupils are equal, round, and reactive to light.  Neck: Normal range of motion. Neck supple.  Cardiovascular: Normal rate, regular rhythm and normal heart sounds.   Pulmonary/Chest: Effort normal and breath sounds normal. No respiratory distress. She has no wheezes. She has no rales. She exhibits no tenderness.  Abdominal: Soft. Bowel sounds are normal. There is no tenderness. There is no rebound and no guarding.  Musculoskeletal: Normal range of motion. She exhibits no edema.  Lymphadenopathy:    She has no cervical adenopathy.  Neurological: She is alert and oriented to person, place, and time.  Patient has a drooping of left-sided the face. She has numbness to light touch to the left side of face from the corner of the eye down as compared to the right. She has a positive left arm drift with 4 out of 5 strength in the left arm as compared to the  right arm which is 5 out of 5 strength. She has a slight weakness in the left leg as compared to the right leg but no drift. She has diminished sensation to light touch in the left arm but normal sensation to light touch in the left leg.  Skin: Skin is warm and dry. No rash noted.  Psychiatric: She has a normal mood and affect.    ED Course  Procedures (including critical care time) Labs Review Labs Reviewed   CBC - Abnormal; Notable for the following:    WBC 11.3 (*)    Hemoglobin 11.9 (*)    All other components within normal limits  DIFFERENTIAL - Abnormal; Notable for the following:    Neutrophils Relative % 78 (*)    Neutro Abs 8.8 (*)    All other components within normal limits  COMPREHENSIVE METABOLIC PANEL - Abnormal; Notable for the following:    Glucose, Bld 178 (*)    BUN 29 (*)    Creatinine, Ser 1.46 (*)    GFR calc non Af Amer 42 (*)    GFR calc Af Amer 49 (*)    All other components within normal limits  CBG MONITORING, ED - Abnormal; Notable for the following:    Glucose-Capillary 163 (*)    All other components within normal limits  I-STAT CHEM 8, ED - Abnormal; Notable for the following:    BUN 36 (*)    Creatinine, Ser 1.50 (*)    Glucose, Bld 183 (*)    All other components within normal limits  PROTIME-INR  APTT  I-STAT TROPOININ, ED  CBG MONITORING, ED    Imaging Review Ct Head (brain) Wo Contrast  06/22/2014   CLINICAL DATA:  Left-sided facial droop. Extremity weakness. History of hypertension. Code stroke.  EXAM: CT HEAD WITHOUT CONTRAST  TECHNIQUE: Contiguous axial images were obtained from the base of the skull through the vertex without intravenous contrast.  COMPARISON:  None.  FINDINGS: There is an approximately 1.2 x 1.5 cm hemorrhage located within the right thalamus (image 13, series 2). No definite intraventricular extension. No significant adjacent mass effect. No midline shift.  Age advanced periventricular and bilateral basal ganglial hypodensities compatible with microvascular ischemic disease. Age advanced atrophy with mild diffuse sulcal prominence. Normal size and configuration of the ventricles and basilar cisterns. Limited visualization the paranasal sinuses and mastoid air cells is normal. No air-fluid levels. Regional soft tissues appear normal. No radiopaque foreign body. A prominent dystrophic calcification is noted about the inner table of  the right frontal calvarium. No displaced calvarial fracture.  IMPRESSION: 1. Acute approximately 1.5 cm right thalamic intraparenchymal hemorrhage without intraventricular extension or significant mass effect. While potentially hypertensive in etiology, given extensive age advanced microvascular ischemic disease, hemorrhagic conversion of a microvascular infarct could have a similar appearance. As such, further evaluation with brain MRI is recommended.  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 Critical Value/emergent results were called by telephone at the time of interpretation on 06/22/2014 at 10:12 pm to Dr. Fredderick PhenixBelfi, who verbally acknowledged these results.   Electronically Signed   By: Simonne ComeJohn  Watts M.D.   On: 06/22/2014 22:21     EKG Interpretation   Date/Time:  Wednesday Jun 22 2014 21:51:57 EDT Ventricular Rate:  89 PR Interval:  153 QRS Duration: 76 QT Interval:  382 QTC Calculation: 465 R Axis:   11 Text Interpretation:  Sinus rhythm Biatrial enlargement LVH with secondary  repolarization abnormality No old tracing to compare Confirmed by  Emmily Pellegrin   MD, Adiyah Lame [phone]) on 06/23/2014 12:25:33 AM      MDM   Final diagnoses:  Hemorrhagic stroke    Patient has hemorrhagic focus on her head CT. She is awake and alert and oriented. A code stroke was initiated on patient arrival. I spoke with Dr. Thad Ranger 2 after review of the CT has accepted the patient for transfer to a neuro ICU bed at [facility]. Patient was started on a Cardene drip to lower her blood pressure emergently. She was transferred by [facility] to [facility] ICU.  CRITICAL CARE Performed by: Aniza Shor Total critical care time: 30 Critical care time was exclusive of separately billable procedures and treating other patients. Critical care was necessary to treat or prevent imminent or life-threatening deterioration. Critical care was time spent personally by me on the following activities: development of  treatment plan with patient and/or surrogate as well as nursing, discussions with consultants, evaluation of patient's response to treatment, examination of patient, obtaining history from patient or surrogate, ordering and performing treatments and interventions, ordering and review of laboratory studies, ordering and review of radiographic studies, pulse oximetry and re-evaluation of patient's condition.     Rolan Bucco, MD 06/23/14 [phone]

## 2014-06-23 NOTE — Progress Notes (Signed)
STROKE TEAM PROGRESS NOTE   SUBJECTIVE (INTERVAL HISTORY) No family is at the bedside.  Overall she feels her condition is rapidly improving. CT showed hematoma stable. BP under control with po meds. Off cardene.   OBJECTIVE Temp:  [97.5 F (36.4 C)-98.5 F (36.9 C)] 98.5 F (36.9 C) (05/26 1652) Pulse Rate:  [74-119] 75 (05/26 1652) Cardiac Rhythm:  [-]  Resp:  [15-26] 16 (05/26 1652) BP: (126-255)/(63-136) 148/90 mmHg (05/26 1652) SpO2:  [94 %-100 %] 98 % (05/26 1652) Weight:  [190 lb 7.6 oz (86.4 kg)-200 lb (90.719 kg)] 190 lb 7.6 oz (86.4 kg) (05/26 0002)   Recent Labs Lab 06/22/14 2155 06/23/14 0113 06/23/14 0811 06/23/14 1212 06/23/14 1600  GLUCAP 163* 263* 291* 196* 167*    Recent Labs Lab 06/22/14 2201 06/22/14 2206 06/23/14 0749  NA 137 138 135  K 3.9 3.9 3.3*  CL 104 104 105  CO2 23  --  20*  GLUCOSE 178* 183* 320*  BUN 29* 36* 25*  CREATININE 1.46* 1.50* 1.26*  CALCIUM 9.2  --  8.7*    Recent Labs Lab 06/22/14 2201  AST 32  ALT 23  ALKPHOS 99  BILITOT 0.7  PROT 7.7  ALBUMIN 4.2    Recent Labs Lab 06/22/14 2201 06/22/14 2206 06/23/14 0749  WBC 11.3*  --  16.5*  NEUTROABS 8.8*  --   --   HGB 11.9* 13.6 11.7*  HCT 36.8 40.0 35.1*  MCV 82.5  --  80.0  PLT 183  --  211   No results for input(s): CKTOTAL, CKMB, CKMBINDEX, TROPONINI in the last 168 hours.  Recent Labs  06/22/14 2201  LABPROT 14.7  INR 1.13   No results for input(s): COLORURINE, LABSPEC, PHURINE, GLUCOSEU, HGBUR, BILIRUBINUR, KETONESUR, PROTEINUR, UROBILINOGEN, NITRITE, LEUKOCYTESUR in the last 72 hours.  Invalid input(s): APPERANCEUR     Component Value Date/Time   CHOL 220* 06/23/2014 0749   TRIG 41 06/23/2014 0749   HDL 72 06/23/2014 0749   CHOLHDL 3.1 06/23/2014 0749   VLDL 8 06/23/2014 0749   LDLCALC 140* 06/23/2014 0749   No results found for: HGBA1C No results found for: LABOPIA, COCAINSCRNUR, LABBENZ, AMPHETMU, THCU, LABBARB  No results for input(s):  ETH in the last 168 hours.  I have personally reviewed the radiological images below and agree with the radiology interpretations.  Ct Head Wo Contrast  06/23/2014     IMPRESSION: 13 mm hemorrhage right thalamus with mild local mass-effect. No ventricular extension  Atrophy and chronic microvascular ischemia.     06/22/2014   IMPRESSION: 1. Acute approximately 1.5 cm right thalamic intraparenchymal hemorrhage without intraventricular extension or significant mass effect. While potentially hypertensive in etiology, given extensive age advanced microvascular ischemic disease, hemorrhagic conversion of a microvascular infarct could have a similar appearance. As such, further evaluation with brain MRI is recommended.  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   Carotid Doppler  pending  2D Echocardiogram  - Left ventricle: The cavity size was normal. Wall thickness was increased in a pattern of mild LVH. Systolic function was normal. The estimated ejection fraction was in the range of 60% to 65%. Wall motion was normal; there were no regional wall motion abnormalities. Features are consistent with a pseudonormal left ventricular filling pattern, with concomitant abnormal relaxation and increased filling pressure (grade 2 diastolic dysfunction). Doppler parameters are consistent with high ventricular filling pressure. - Atrial septum: No defect or patent foramen ovale was identified. Impressions: - No cardiac source of emboli  was indentified.  PHYSICAL EXAM  Temp:  [97.5 F (36.4 C)-98.5 F (36.9 C)] 98.5 F (36.9 C) (05/26 1652) Pulse Rate:  [74-119] 75 (05/26 1652) Resp:  [15-26] 16 (05/26 1652) BP: (126-255)/(63-136) 148/90 mmHg (05/26 1652) SpO2:  [94 %-100 %] 98 % (05/26 1652) Weight:  [190 lb 7.6 oz (86.4 kg)-200 lb (90.719 kg)] 190 lb 7.6 oz (86.4 kg) (05/26 0002)  General - Well nourished, well developed, in no apparent distress.  Ophthalmologic  - Sharp disc margins OU.  Cardiovascular - Regular rate and rhythm with no murmur.  Neck - supple, no carotid bruits  Mental Status -  Level of arousal and orientation to time, place, and person were intact. Language including expression, naming, repetition, comprehension was assessed and found intact. Fund of Knowledge was assessed and was intact.  Cranial Nerves II - XII - II - Visual field intact OU. III, IV, VI - Extraocular movements intact. V - Facial sensation intact bilaterally. VII - mild left nasolabial fold flattening. VIII - Hearing & vestibular intact bilaterally. X - Palate elevates symmetrically. XI - Chin turning & shoulder shrug intact bilaterally. XII - Tongue protrusion intact.  Motor Strength - The patient's strength was normal in all extremities but pronator drift was present on the left.  Bulk was normal and fasciculations were absent.   Motor Tone - Muscle tone was assessed at the neck and appendages and was normal.  Reflexes - The patient's reflexes were symmetrical in all extremities and she had no pathological reflexes.  Sensory - Light touch, temperature/pinprick were assessed and were symmetrical.    Coordination - The patient had normal movements in the hands and feet with no ataxia or dysmetria.  Tremor was absent.  Gait and Station - deferred due to safety concerns.   ASSESSMENT/PLAN [name] is a 44 y.o. female with history of hypertension, diabetes admitted for right thalamic ICH. Symptoms rapidly improving.    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  CT repeat showed stable small hematoma  Carotid Doppler  pending  2D Echo  unremarkable  LDL 140  HgbA1c pending  Subcutaneous heparin for VTE prophylaxis  Diet heart healthy/carb modified Room service appropriate?: Yes; Fluid consistency:: Thin   no antithrombotic prior to admission, now on no antithrombotic  Ongoing aggressive stroke risk factor  management  Therapy recommendations:  Pending  Disposition:  Pending  Diabetes  HgbA1c pending goal < 7.0  Uncontrolled  CBG monitoring  SSI  DM education  Hypertension  Home meds:   None BP goal < 160 Currently on amlodipine and metoprolol Off Cardene drip  Stable  Patient counseled to be compliant with her blood pressure medications  Hyperlipidemia  Home meds:  None   LDL 140, goal < 70  Add Lipitor 40  Continue statin at discharge  Other Stroke Risk Factors    Other Active Problems  Elevated creatinine  Hypokalemia  Leukocytosis  Other Pertinent History  Medication noncompliance  PCP appointment next week  Hospital day # 1  This patient is critically ill due to right thalamic ICH, and control blood pressure and at significant risk of neurological worsening, death form hematoma expansion, cerebral edema, brain herniation. This patient's care requires constant monitoring of vital signs, hemodynamics, respiratory and cardiac monitoring, review of multiple databases, neurological assessment, discussion with family, other specialists and medical decision making of high complexity. I spent 35 minutes of neurocritical care time in the care of this patient.   Marvel Plan, MD PhD  Stroke Neurology 06/23/2014 5:25 PM    To contact Stroke Continuity provider, please refer to [email or web address]. After hours, contact General Neurology

## 2014-06-23 NOTE — ED Notes (Signed)
2245 BP 155/85  2340 BP 158/87 2335 BP 179/97 2330 BP 180/101 2325 BP 209137

## 2014-06-23 NOTE — Care Management Note (Signed)
Case Management Note  Patient Details  Name: Stephanie Lloyd MRN: [id number] Date of Birth: 05/29/1970  Subjective/Objective:    Pt admitted on 06/22/14 with ICH.  PTA, pt resided at home with spouse and was independent.                  Action/Plan: Will follow for discharge planning as pt progresses.    Expected Discharge Date:                  Expected Discharge Plan:  Home/Self Care  In-House Referral:     Discharge planning Services  CM Consult  Post Acute Care Choice:    Choice offered to:     DME Arranged:    DME Agency:     HH Arranged:    HH Agency:     Status of Service:  In process, will continue to follow  Medicare Important Message Given:    Date Medicare IM Given:    Medicare IM give by:    Date Additional Medicare IM Given:    Additional Medicare Important Message give by:     If discussed at Long Length of Stay Meetings, dates discussed:    Additional Comments:  Quintella BatonJulie W. Rawad Bochicchio, RN, BSN  Trauma/Neuro ICU Case Manager [phone]

## 2014-06-23 NOTE — Progress Notes (Signed)
Echocardiogram 2D Echocardiogram has been performed.  Stephanie Lloyd, Stephanie Lloyd M 06/23/2014, 10:16 AM

## 2014-06-23 NOTE — ED Notes (Signed)
2340 Report given to Charge nurse.

## 2014-06-24 ENCOUNTER — Inpatient Hospital Stay (HOSPITAL_COMMUNITY): Payer: 59

## 2014-06-24 DIAGNOSIS — I619 Nontraumatic intracerebral hemorrhage, unspecified: Secondary | ICD-10-CM

## 2014-06-24 LAB — CBC
HCT: 36.6 % (ref 36.0–46.0)
HEMOGLOBIN: 11.9 g/dL — AB (ref 12.0–15.0)
MCH: 26.4 pg (ref 26.0–34.0)
MCHC: 32.5 g/dL (ref 30.0–36.0)
MCV: 81.3 fL (ref 78.0–100.0)
Platelets: 199 10*3/uL (ref 150–400)
RBC: 4.5 MIL/uL (ref 3.87–5.11)
RDW: 14.4 % (ref 11.5–15.5)
WBC: 10.7 10*3/uL — AB (ref 4.0–10.5)

## 2014-06-24 LAB — BASIC METABOLIC PANEL
ANION GAP: 6 (ref 5–15)
BUN: 22 mg/dL — ABNORMAL HIGH (ref 6–20)
CALCIUM: 8.9 mg/dL (ref 8.9–10.3)
CHLORIDE: 105 mmol/L (ref 101–111)
CO2: 23 mmol/L (ref 22–32)
Creatinine, Ser: 1.18 mg/dL — ABNORMAL HIGH (ref 0.44–1.00)
GFR calc Af Amer: >60 mL/min (ref >60)
GFR calc non Af Amer: 54 mL/min — ABNORMAL LOW (ref >60)
Glucose, Bld: 197 mg/dL — ABNORMAL HIGH (ref 65–99)
Potassium: 2.9 mmol/L — ABNORMAL LOW (ref 3.5–5.1)
Sodium: 134 mmol/L — ABNORMAL LOW (ref 135–145)

## 2014-06-24 LAB — GLUCOSE, CAPILLARY
GLUCOSE-CAPILLARY: 172 mg/dL — AB (ref 65–99)
GLUCOSE-CAPILLARY: 234 mg/dL — AB (ref 65–99)
Glucose-Capillary: 179 mg/dL — ABNORMAL HIGH (ref 65–99)

## 2014-06-24 LAB — HEMOGLOBIN A1C
Hgb A1c MFr Bld: 9 % — ABNORMAL HIGH (ref 4.8–5.6)
Mean Plasma Glucose: 212 mg/dL

## 2014-06-24 MED ORDER — METOPROLOL TARTRATE 25 MG PO TABS
37.5000 mg | ORAL_TABLET | Freq: Two times a day (BID) | ORAL | Status: DC
Start: 2014-06-24 — End: 2014-06-24
  Administered 2014-06-24: 37.5 mg via ORAL
  Filled 2014-06-24 (×2): qty 1

## 2014-06-24 MED ORDER — LISINOPRIL 20 MG PO TABS
20.0000 mg | ORAL_TABLET | Freq: Every day | ORAL | Status: DC
  Administered 2014-06-24: 20 mg via ORAL
  Filled 2014-06-24: qty 1

## 2014-06-24 MED ORDER — AMLODIPINE BESYLATE 10 MG PO TABS: 10.0000 mg | ORAL_TABLET | Freq: Every day | ORAL | Status: DC

## 2014-06-24 MED ORDER — METOPROLOL TARTRATE 50 MG PO TABS: 50.0000 mg | ORAL_TABLET | Freq: Two times a day (BID) | ORAL | Status: DC

## 2014-06-24 MED ORDER — ATORVASTATIN CALCIUM 40 MG PO TABS: 40.0000 mg | ORAL_TABLET | Freq: Every day | ORAL | Status: DC

## 2014-06-24 MED ORDER — POTASSIUM CHLORIDE CRYS ER 20 MEQ PO TBCR: 20.0000 meq | EXTENDED_RELEASE_TABLET | Freq: Two times a day (BID) | ORAL | Status: DC

## 2014-06-24 MED ORDER — LISINOPRIL 20 MG PO TABS: 20.0000 mg | ORAL_TABLET | Freq: Every day | ORAL | Status: DC

## 2014-06-24 MED ORDER — POTASSIUM CHLORIDE CRYS ER 20 MEQ PO TBCR
40.0000 meq | EXTENDED_RELEASE_TABLET | ORAL | Status: AC
  Administered 2014-06-24 (×3): 40 meq via ORAL
  Filled 2014-06-24 (×3): qty 2

## 2014-06-24 MED ORDER — METOPROLOL TARTRATE 37.5 MG PO TABS: 37.5000 mg | ORAL_TABLET | Freq: Two times a day (BID) | ORAL | Status: DC

## 2014-06-24 NOTE — Evaluation (Signed)
Occupational Therapy Evaluation and Discharge Patient Details Name: Stephanie Lloyd MRN: [id number] DOB: [birth year] Today's Date: 06/24/2014    History of Present Illness Pt admitted with left sided weakness and slurred speech. Found to have 13 mm hemorrhage right thalamus with mild local mass-effect.    Clinical Impression   This 44 yo female admitted with above presents to acute OT with all education completed, we will D/C from acute OT.    Follow Up Recommendations  No OT follow up    Equipment Recommendations  None recommended by OT       Precautions / Restrictions Precautions Precautions: Fall Restrictions Weight Bearing Restrictions: No      Mobility Bed Mobility               General bed mobility comments: Pt up in recliner upon arrival  Transfers Overall transfer level: Needs assistance   Transfers: Sit to/from Stand Sit to Stand: Min guard                   ADL Overall ADL's : Needs assistance/impaired Eating/Feeding: Independent;Sitting   Grooming: Min guard;Standing   Upper Body Bathing: Sitting;Set up   Lower Body Bathing: Min guard;Sit to/from stand   Upper Body Dressing : Set up;Sitting   Lower Body Dressing: Min guard;Sit to/from stand   Toilet Transfer: Min guard;Ambulation;Comfort height toilet   Toileting- Clothing Manipulation and Hygiene: Min guard;Sit to/from stand     Tub/Shower Transfer Details (indicate cue type and reason): Advised pt and husband that pt either needs to sit on a seat to shower or take a bath         Vision Vision Assessment?: Yes Eye Alignment: Within Functional Limits Ocular Range of Motion: Within Functional Limits Alignment/Gaze Preference: Within Defined Limits Tracking/Visual Pursuits: Able to track stimulus in all quads without difficulty Saccades: Within functional limits Convergence: Within functional limits Visual Fields: No apparent deficits          Pertinent Vitals/Pain Pain  Assessment: No/denies pain     Hand Dominance Right   Extremity/Trunk Assessment Upper Extremity Assessment Upper Extremity Assessment: LUE deficits/detail LUE Sensation: decreased proprioception (for FM--gave pt handout for LUE and Bil UE activities)           Communication Communication Communication: No difficulties   Cognition Arousal/Alertness: Awake/alert Behavior During Therapy: Flat affect Overall Cognitive Status: Impaired/Different from baseline Area of Impairment: Following commands       Following Commands: Follows one step commands with increased time                      Home Living Family/patient expects to be discharged to:: Private residence Living Arrangements: Spouse/significant other Available Help at Discharge: Family;Available 24 hours/day Type of Home: Apartment Home Access: Level entry     Home Layout: Two level;Bed/bath upstairs Alternate Level Stairs-Number of Steps: 12 Alternate Level Stairs-Rails: Left Bathroom Shower/Tub: Tub/shower unit;Curtain Shower/tub characteristics: Engineer, building services: Standard     Home Equipment: None      Lives With: Spouse    Prior Functioning/Environment Level of Independence: Independent             OT Diagnosis: Generalized weakness;Hemiplegia non-dominant side                          End of Session    Activity Tolerance: Patient tolerated treatment well Patient left: in chair;with call bell/phone within reach;with family/visitor present   Time: 4098-1191  OT Time Calculation (min): 23 min Charges:  OT General Charges $OT Visit: 1 Procedure OT Evaluation $Initial OT Evaluation Tier I: 1 Procedure OT Treatments $Self Care/Home Management : 8-22 mins  Evette GeorgesLeonard, Bertis Hustead Eva [phone] 06/24/2014, 3:19 PM

## 2014-06-24 NOTE — Progress Notes (Signed)
Physical Therapy Evaluation and Discharge Patient Details Name: Stephanie BalsamMarva Lloyd MRN: [id number] DOB: 06/01/1970 Today's Date: 06/24/2014   History of Present Illness  Pt admitted with left sided weakness and slurred speech. Found to have 13 mm hemorrhage right thalamus with mild local mass-effect.   Clinical Impression  Patient evaluated by Physical Therapy with no further acute PT needs identified. All education has been completed and the patient has no further questions. Tolerated gait training and safely completed stair training. Mild ataxia requiring rolling walker for safe mobility. Husband able to provide 24 hour supervision as needed. Pt would greatly benefit from follow up PT with outpatient neuro rehab to continue progressing her safety with mobility and functional independence. See below for any follow-up Physial Therapy or equipment needs. PT is signing off. Thank you for this referral.     Follow Up Recommendations Other (comment) (Outpatient Neuro rehabilitation)    Equipment Recommendations  Rolling walker with 5" wheels    Recommendations for Other Services       Precautions / Restrictions Precautions Precautions: Fall Restrictions Weight Bearing Restrictions: No      Mobility  Bed Mobility Overal bed mobility: Modified Independent             General bed mobility comments: Pt up in recliner upon arrival  Transfers Overall transfer level: Needs assistance Equipment used: None;1 person hand held assist Transfers: Sit to/from Stand Sit to Stand: Min guard         General transfer comment: Mild sway upon standing without assistance. Provided hand held assist during second trail of standing and pt more stable.  Ambulation/Gait Ambulation/Gait assistance: Min guard Ambulation Distance (Feet): 225 Feet Assistive device: Rolling walker (2 wheeled);1 person hand held assist;None;Straight cane Gait Pattern/deviations: Step-through pattern;Decreased stride  length;Ataxic;Drifts right/left;Scissoring Gait velocity: decreased Gait velocity interpretation: Below normal speed for age/gender General Gait Details: Attempted ambulating with SPC, RW, hand held assist, and no device. Pt with mildly ataxic scissoring of gait when ambulating with no device or hand held assist. Improves slight but still very guarded and slow with use of cane. Gait speed best and most stable balance while using RW. Educated on use of each device and pt agrees she is safest with RW use. No over loss of balance however coordination impacting normal stability.  Stairs Stairs: Yes Stairs assistance: Supervision Stair Management: One rail Right;Alternating pattern;Step to pattern;Forwards Number of Stairs: 12 General stair comments: practiced step-to and alternating step patterns. Patient performs both safely with supervision for safety. Quite slow with descent although good foot placement and no loss of balance.  Wheelchair Mobility    Modified Rankin (Stroke Patients Only) Modified Rankin (Stroke Patients Only) Pre-Morbid Rankin Score: No symptoms Modified Rankin: Moderately severe disability     Balance Overall balance assessment: Needs assistance Sitting-balance support: No upper extremity supported;Feet supported Sitting balance-Leahy Scale: Good     Standing balance support: No upper extremity supported Standing balance-Leahy Scale: Fair                               Pertinent Vitals/Pain Pain Assessment: No/denies pain    Home Living Family/patient expects to be discharged to:: Private residence Living Arrangements: Spouse/significant other Available Help at Discharge: Family;Available 24 hours/day Type of Home: Apartment Home Access: Level entry     Home Layout: Two level;Bed/bath upstairs Home Equipment: None      Prior Function Level of Independence: Independent  Hand Dominance   Dominant Hand: Right     Extremity/Trunk Assessment   Upper Extremity Assessment: LUE deficits/detail           Lower Extremity Assessment: Overall WFL for tasks assessed         Communication   Communication: No difficulties  Cognition Arousal/Alertness: Awake/alert Behavior During Therapy: Flat affect Overall Cognitive Status: Impaired/Different from baseline Area of Impairment: Following commands       Following Commands: Follows one step commands with increased time     Problem Solving: Slow processing General Comments: A&O x4 although delayed responses at times.    General Comments General comments (skin integrity, edema, etc.): Husband present during evaluation. States he can provide 24 hour supervision at home and take patient to outpatient follow-up visits. Encouraged husband to provide supervision with mobility until outpatient follow up visit. Pt to use RW at d/c for stability. Reviewed FAST acronym for signs/symptoms of extension of ICH.    Exercises        Assessment/Plan    PT Assessment Patent does not need any further PT services  PT Diagnosis Abnormality of gait   PT Problem List    PT Treatment Interventions     PT Goals (Current goals can be found in the Care Plan section) Acute Rehab PT Goals Patient Stated Goal: Go home PT Goal Formulation: All assessment and education complete, DC therapy    Frequency     Barriers to discharge        Co-evaluation               End of Session Equipment Utilized During Treatment: Gait belt Activity Tolerance: Patient tolerated treatment well Patient left: in chair;with call bell/phone within reach;with family/visitor present Nurse Communication: Mobility status         Time: 8119-1478 PT Time Calculation (min) (ACUTE ONLY): 23 min   Charges:   PT Evaluation $Initial PT Evaluation Tier I: 1 Procedure PT Treatments $Gait Training: 8-22 mins   PT G CodesBerton Mount 06/24/2014, 3:37 PM [name], [place] [phone]

## 2014-06-24 NOTE — Evaluation (Signed)
Speech Language Pathology Evaluation Patient Details Name: Stephanie Lloyd [place] MRN: [id number] DOB: 03/30/1970 Today's Date: 06/24/2014 Time: [id number] SLP Time Calculation (min) (ACUTE ONLY): 26 min  Problem List:  Patient Active Problem List   Diagnosis Date Noted  . Hemorrhagic stroke 06/22/2014  . ICH (intracerebral hemorrhage) 06/22/2014   Past Medical History:  Past Medical History  Diagnosis Date  . Immune deficiency disorder   . Diabetes   . Hypertension    Past Surgical History: No past surgical history on file. HPI:  [name] is a 44 y.o. female with history of hypertension, diabetes admitted for right thalamic ICH.    Assessment / Plan / Recommendation Clinical Impression  Pt scored 14/30 on the MoCA, indicative of cognitive impairment (less than 26 is abnormal). She has difficulty completing mild to moderately complex tasks due to impairments with selective attention, working memory, and complex problem solving. SLP provided Min cues for delayed recall with 5 word trial, although functionally she is able to recall daily events and providers' recommendations independently. She has mild left facial droop which does not impact the intelligibility of her speech. Her pitch is monotonous, although she reports that this is baseline. Pt would benefit from OP SLP f/u targeting higher level cognitive skills and executive functions.    SLP Assessment  Patient needs continued Speech Lanaguage Pathology Services    Follow Up Recommendations  Outpatient SLP;24 hour supervision/assistance (upon initial d/c - will likely be able to do intermittent)    Frequency and Duration min 2x/week  1 week   Pertinent Vitals/Pain Pain Assessment: No/denies pain   SLP Goals  Patient/Family Stated Goal: none stated Potential to Achieve Goals (ACUTE ONLY): Good  SLP Evaluation Prior Functioning  Cognitive/Linguistic Baseline: Within functional limits Type of Home: Apartment  Lives With:  Spouse Available Help at Discharge: Family;Available 24 hours/day Vocation: Part time employment   Cognition  Overall Cognitive Status: Impaired/Different from baseline Arousal/Alertness: Awake/alert Orientation Level: Oriented X4 Attention: Sustained;Selective Sustained Attention: Appears intact Selective Attention: Impaired Selective Attention Impairment: Verbal complex Memory: Impaired Memory Impairment: Retrieval deficit Awareness: Appears intact Problem Solving: Impaired Problem Solving Impairment: Verbal complex Executive Function: Reasoning Reasoning: Appears intact Safety/Judgment: Appears intact    Comprehension  Auditory Comprehension Overall Auditory Comprehension: Appears within functional limits for tasks assessed    Expression Expression Primary Mode of Expression: Verbal Verbal Expression Overall Verbal Expression: Appears within functional limits for tasks assessed Written Expression Dominant Hand: Right   Oral / Motor Oral Motor/Sensory Function Overall Oral Motor/Sensory Function: Impaired Facial ROM: Reduced left Facial Symmetry: Left droop Facial Strength: Reduced Motor Speech Overall Motor Speech: Appears within functional limits for tasks assessed (monotone, but pt reports this is baseline)     Maxcine HamLaura Paiewonsky, M.A. CCC-SLP [phone]  Maxcine Hamaiewonsky, Lus Kriegel 06/24/2014, 2:45 PM

## 2014-06-24 NOTE — Discharge Summary (Signed)
Stroke Discharge Summary  Patient ID: Stephanie Lloyd   MRN: [id number]      DOB: 05-13-1970  Date of Admission: 06/22/2014 Date of Discharge: 06/24/2014  Attending Physician:  Marvel Plan, MD, Stroke MD  Consulting Physician(s):     None  Patient's PCP:  No primary care provider on file.  DISCHARGE DIAGNOSIS: Intracerebral hemorrhage Active Problems:   ICH (intracerebral hemorrhage)  BMI: Body mass index is 28.12 kg/(m^2).  Past Medical History  Diagnosis Date  . Immune deficiency disorder   . Diabetes   . Hypertension    No past surgical history on file.    Medication List    STOP taking these medications        Pseudoephedrine-Ibuprofen 30-200 MG Tabs      TAKE these medications        amLODipine 10 MG tablet  Commonly known as:  NORVASC  Take 1 tablet (10 mg total) by mouth daily.     atorvastatin 40 MG tablet  Commonly known as:  LIPITOR  Take 1 tablet (40 mg total) by mouth daily at 6 PM.     lisinopril 20 MG tablet  Commonly known as:  PRINIVIL,ZESTRIL  Take 1 tablet (20 mg total) by mouth daily.     Metoprolol Tartrate 37.5 MG Tabs  Take 37.5 mg by mouth 2 (two) times daily.     potassium chloride SA 20 MEQ tablet  Commonly known as:  K-DUR,KLOR-CON  Take 1 tablet (20 mEq total) by mouth 2 (two) times daily.        LABORATORY STUDIES CBC    Component Value Date/Time   WBC 10.7* 06/24/2014 0407   RBC 4.50 06/24/2014 0407   HGB 11.9* 06/24/2014 0407   HCT 36.6 06/24/2014 0407   PLT 199 06/24/2014 0407   MCV 81.3 06/24/2014 0407   MCH 26.4 06/24/2014 0407   MCHC 32.5 06/24/2014 0407   RDW 14.4 06/24/2014 0407   LYMPHSABS 1.7 06/22/2014 2201   MONOABS 0.5 06/22/2014 2201   EOSABS 0.3 06/22/2014 2201   BASOSABS 0.0 06/22/2014 2201   CMP    Component Value Date/Time   NA 134* 06/24/2014 0407   K 2.9* 06/24/2014 0407   CL 105 06/24/2014 0407   CO2 23 06/24/2014 0407   GLUCOSE 197* 06/24/2014 0407   BUN 22* 06/24/2014 0407    CREATININE 1.18* 06/24/2014 0407   CALCIUM 8.9 06/24/2014 0407   PROT 7.7 06/22/2014 2201   ALBUMIN 4.2 06/22/2014 2201   AST 32 06/22/2014 2201   ALT 23 06/22/2014 2201   ALKPHOS 99 06/22/2014 2201   BILITOT 0.7 06/22/2014 2201   GFRNONAA 54* 06/24/2014 0407   GFRAA >60 06/24/2014 0407   COAGS Lab Results  Component Value Date   INR 1.13 06/22/2014   Lipid Panel    Component Value Date/Time   CHOL 220* 06/23/2014 0749   TRIG 41 06/23/2014 0749   HDL 72 06/23/2014 0749   CHOLHDL 3.1 06/23/2014 0749   VLDL 8 06/23/2014 0749   LDLCALC 140* 06/23/2014 0749   HgbA1C  Lab Results  Component Value Date   HGBA1C 9.0* 06/23/2014   Cardiac Panel (last 3 results) No results for input(s): CKTOTAL, CKMB, TROPONINI, RELINDX in the last 72 hours. Urinalysis No results found for: COLORURINE, APPEARANCEUR, LABSPEC, PHURINE, GLUCOSEU, HGBUR, BILIRUBINUR, KETONESUR, PROTEINUR, UROBILINOGEN, NITRITE, LEUKOCYTESUR Urine Drug Screen No results found for: LABOPIA, COCAINSCRNUR, LABBENZ, AMPHETMU, THCU, LABBARB  Alcohol Level No results found for: [facility]  SIGNIFICANT DIAGNOSTIC STUDIES   Ct Head Wo Contrast  06/23/2014 IMPRESSION: 13 mm hemorrhage right thalamus with mild local mass-effect. No ventricular extension Atrophy and chronic microvascular ischemia.   06/22/2014 IMPRESSION: 1. Acute approximately 1.5 cm right thalamic intraparenchymal hemorrhage without intraventricular extension or significant mass effect. While potentially hypertensive in etiology, given extensive age advanced microvascular ischemic disease, hemorrhagic conversion of a microvascular infarct could have a similar appearance. As such, further evaluation with brain MRI is recommended. 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   Carotid Dopplerfinal report pending  2D Echocardiogram - Left ventricle: The cavity size was normal. Wall thickness was increased in a pattern of mild LVH. Systolic  function was normal. The estimated ejection fraction was in the range of 60% to 65%. Wall motion was normal; there were no regional wall motion abnormalities. Features are consistent with a pseudonormal left ventricular filling pattern, with concomitant abnormal relaxation and increased filling pressure (grade 2 diastolic dysfunction). Doppler parameters are consistent with high ventricular filling pressure. - Atrial septum: No defect or patent foramen ovale was identified. Impressions: - No cardiac source of emboli was indentified.    HISTORY OF PRESENT ILLNESS  Stephanie Lloyd is a 44 y.o. female who reports that she was at work this evening and had the acute onset of left sided weakness. Her husband noted slurred speech as well. Initially it was felt that her symptoms were due to blood sugar but when they attempted to give her some food she had difficulty swallowing the food. The patient was brought to [facility] at that time and a code stroke was called. BP was elevated at 255/136. Initial NIHSS of 4.   Date last known well: Date: 06/22/2014 Time last known well: Time: 20:30 tPA Given: No: ICH  HOSPITAL COURSE  [name] is a 44 y.o. female with history of hypertension, diabetes admitted for right thalamic ICH. Symptoms rapidly improved.  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  CT repeat showed stable small hematoma  Carotid Doppler pending  2D Echo unremarkable  LDL 140  HgbA1c 9.0  Subcutaneous heparin for VTE prophylaxis  Diet heart healthy/carb modified Room service appropriate?: Yes; Fluid consistency:: Thin   no antithrombotic prior to admission, now on no antithrombotic  Ongoing aggressive stroke risk factor management  Therapy recommendations:Outpatient neuro rehabilitation and speech therapy. 24 hour per day supervision and assistance initially.  Disposition: Discharged to home.  Diabetes  HgbA1c 9.0 goal <  7.0  Uncontrolled  CBG monitoring  DM education  Follow up with PCP next week  Hypertension  Home meds: None  BP goal < 160  Off Cardene drip  Stable  Patient counseled to be compliant with her blood pressure medications  Hyperlipidemia  Home meds: None   LDL 140, goal < 70  Add Lipitor 40  Continue statin at discharge   Other Active Problems  Elevated creatinine - encourage fluids. Recheck within 5 days.  Hypokalemia - add supplementation. Recheck within 5 days.  Leukocytosis - recheck within 5 days. Afebrile.  Other Pertinent History  Medication noncompliance  PCP appointment next week  DISCHARGE EXAM Blood pressure 122/70, pulse 59, temperature 98 F (36.7 C), temperature source Oral, resp. rate 20, height 5\' 9"  (1.753 m), weight 86.4 kg (190 lb 7.6 oz), SpO2 100 %.   General - Well nourished, well developed, in no apparent distress.  Ophthalmologic - Sharp disc margins OU.  Cardiovascular - Regular rate and rhythm with no murmur.  Neck - supple, no  carotid bruits  Mental Status -  Level of arousal and orientation to time, place, and person were intact. Language including expression, naming, repetition, comprehension was assessed and found intact. Fund of Knowledge was assessed and was intact.  Cranial Nerves II - XII - II - Visual field intact OU. III, IV, VI - Extraocular movements intact. V - Facial sensation intact bilaterally. VII - mild left nasolabial fold flattening. VIII - Hearing & vestibular intact bilaterally. X - Palate elevates symmetrically. XI - Chin turning & shoulder shrug intact bilaterally. XII - Tongue protrusion intact.  Motor Strength - The patient's strength was normal in all extremities but pronator drift was present on the left. Bulk was normal and fasciculations were absent.  Motor Tone - Muscle tone was assessed at the neck and appendages and was normal.  Reflexes - The patient's reflexes were  symmetrical in all extremities and she had no pathological reflexes.  Sensory - Light touch, temperature/pinprick were assessed and were symmetrical.   Coordination - The patient had normal movements in the hands and feet with no ataxia or dysmetria. Tremor was absent.  Gait and Station - deferred due to safety concerns.  Discharge Diet   Diet heart healthy/carb modified Room service appropriate?: Yes; Fluid consistency:: Thin liquids  DISCHARGE PLAN  Disposition:  Discharged to home. 24-hour per day supervision and assistance recommended initially.  no antithrombotic for secondary stroke prevention secondary to hemorrhagic stroke.  Follow-up with a primary care physician within 1-2 weeks.  Have bmet and CBC within 5 days.  Follow-up with Dr. Marvel Plan in the Stroke Clinic in 2 months.  35 minutes were spent preparing discharge.  Delton See PA-C [facility] Pager [phone] 06/24/2014, 4:58 PM  I, the attending vascular neurologist, have personally obtained a history, examined the patient, evaluated laboratory data, individually viewed imaging studies and agree with radiology interpretations. I also discussed with husband at bedside about her care plan. Together with the NP/PA, we formulated the assessment and plan of care which reflects our mutual decision.  I have made any additions or clarifications directly to the above note and agree with the findings and plan as currently documented.    Marvel Plan, MD PhD Stroke Neurology 06/25/2014 12:03 AM

## 2014-06-24 NOTE — Discharge Instructions (Signed)
1) 24 hour per day supervision and assistance recommended. 2) Find a primary care physician within 1-2 weeks for further treatment of hypertension, low potassium, diabetes, and renal insufficiency. 3) Have a potassium level checked within 5 days. 4) Have a BUN and creatinine level checked within 5 days. 5) Have glucose level checked within 5 days 6) Have CBC blood test within 5 days. 7) Outpatient therapies will be scheduled. 8) Drink plenty of fluids. 9) Avoid sugar and all sweets. Hemorrhagic Stroke A hemorrhagic stroke occurs when a blood vessel in the brain leaks or bursts. Areas of the brain that should receive blood, oxygen, and nutrients from the damaged blood vessel are deprived of blood flow. This causes areas of the brain to become damaged. Damage also occurs to areas of the brain where the leaked blood accumulates and presses on normal tissue. This is a medical emergency. This can cause permanent damage and loss of brain function. CAUSES  A hemorrhagic stroke is caused by a decrease of oxygen supply to an area of your brain. It is the result of a blood vessel that leaks or ruptures. The leaking or rupturing blood vessel occurs due to one of the following conditions:  A ballooning of a weak section in a blood vessel (aneurysm).  Hardened, thin blood vessels. Blood vessel walls lined with plaque becoming thin and hardened. These hardened, thin artery walls can crack open and allow blood to flow out of the blood vessel.  An abnormal formation of a blood vessel (arteriovenous malformation). This condition results in an abnormal tangle of thin-walled blood vessels. Once the blood vessel ruptures, bleeding occurs. The blood from the ruptured blood vessel accumulates and compresses the surrounding brain tissue. Hemorrhagic strokes are classified as to the location of the bleed. If the bleeding occurs within the brain tissue, the condition is called an intracerebral hemorrhage. If the bleeding  occurs in the area between the brain and the thin tissues that cover the brain, the condition is called a subarachnoid hemorrhage.  RISK FACTORS  High blood pressure (hypertension).  Abnormal blood vessels present since birth.  Bleeding disorders, such as hemophilia, sickle cell disease, or liver disease.  The blood becoming too thin while taking blood thinners (anticoagulants).  Smoking.  Excessive alcohol use.  Use of illicit drugs (especially cocaine or methamphetamine). SYMPTOMS   Sudden, severe headache with no known cause. The headache is often described as the worst headache ever experienced.  Nausea or vomiting, especially when combined with other symptoms such as a headache.  Sudden weakness or numbness of the face, arm, or leg, especially on one side of the body.  Sudden trouble walking or difficulty moving the legs.  Sudden confusion.  Sudden personality changes.  Trouble speaking (aphasia) or understanding.  Difficulty swallowing.  Sudden trouble seeing in one or both eyes.  Double vision.  Dizziness.  Loss of balance or coordination.  Intolerance to light.  Stiff neck. DIAGNOSIS  Your health care provider will often suspect a hemorrhagic stroke based on your symptoms, history, and exam. A CT scan of the brain is usually performed. This is done to confirm the presence of bleeding in the brain, to look for causes, and to determine severity. Other tests may be done, including:  An MRI.  Angiography.  Blood tests. TREATMENT  The goals of treatment are to try to stop the bleeding, control pressure in the brain, and relieve symptoms.  Medicines may be given to:  Lower blood pressure (antihypertensives).  Relieve pain (  analgesics).  Relieve nausea or vomiting.  Stop or prevent seizures.  Prevent the blood vessels in the brain from going into spasm in response to the presence of bleeding.  Other medicines, blood products, or vitamin K may also  be given to control the bleeding.  If there is a collection of blood putting pressure on your brain, or if the blood vessel continues to bleed, surgery may be required.  Surgery may also be needed if tests reveal that there are other problems within the blood vessels of the brain that put you at an elevated risk for another bleeding event in the future. Further treatment depends on the duration, severity, and cause of your symptoms. Physical, speech, and occupational therapists will assess you and work to improve any functions impaired by the stroke. Measures will be taken to prevent short-term and long-term complications, including infection from breathing foreign material into the lungs (aspiration pneumonia), blood clots in the legs, bedsores, and falls. HOME CARE INSTRUCTIONS   Take medicines only as instructed by your health care provider.  If swallow studies have determined that your swallowing reflex is present, you should eat healthy foods. Including 5 or more servings of fruits and vegetables a day may reduce the risk of stroke. Foods may need to be a certain consistency (soft or pureed), or small bites may need to be taken in order to avoid aspirating or choking. Certain dietary changes may be advised to address high blood pressure, high cholesterol, diabetes, or obesity.  Food choices that are low in sodium, saturated fat, trans fat, and cholesterol are recommended to manage high blood pressure.  Food choies that are high in fiber, and low in saturated fat, trans fat, and cholesterol may control cholesterol levels.  Controlling carbohydrates and sugar intake is recommended to manage diabetes.  Reducing calorie intake and making food choices that are low in sodium, saturated fat, trans fat, and cholesterol are recommended to manage obesity.  Maintain a healthy weight.  Stay physically active. It is recommended that you get at least 30 minutes of activity on most or all days.  Limit  alcohol use. Moderate alcohol use is considered to be:  No more than 2 drinks each day for men.  No more than 1 drink each day for nonpregnant women.  Stop drug abuse.  Manage any other health care conditions you may have, if applicable.  A safe home environment is important to reduce the risk of falls. Your health care provider may arrange for specialists to evaluate your home. Having grab bars in the bedroom and bathroom is often important. Your health care provider may arrange for special equipment to be used at home, such as raised toilets and a seat for the shower.  Physical, occupational, and speech therapy. Ongoing therapy may be needed to maximize your recovery after a stroke. If you have been advised to use a walker or a cane, use it at all times. Be sure to keep your therapy appointments.  Follow all instructions for follow-up with your health care provider. This is very important. This includes any referrals, physical therapy, rehabilitation, and laboratory tests. Proper follow-up can prevent another stroke from occurring. SEEK MEDICAL CARE IF:  You have personality changes.  You have difficulty swallowing.  You are seeing double.  You have dizziness.  You have a fever.  You have skin breakdown. SEEK IMMEDIATE MEDICAL CARE IF:   You have a sudden, severe headache with no known cause.  You have sudden nausea or  vomiting with a severe headache.  You have sudden weakness or numbness of the face, arm, or leg, especially on one side of the body.  You have sudden trouble walking or difficulty moving arms or legs.  You have sudden confusion.  You have trouble speaking (aphasia) or understanding.  You have sudden trouble seeing in one or both eyes.  You have a sudden loss of balance or coordination.  You have a stiff neck.  You have difficulty breathing.  You have a partial or total loss of consciousness. Any of these symptoms may represent a serious problem that  is an emergency. Do not wait to see if the symptoms will go away. Get medical help at once. Call your local emergency services (911 in [place] Do not drive yourself to the hospital. Document Released: 07/04/2009 Document Revised: 05/31/2013 Document Reviewed: 08/25/2011 [facility] Patient Information 2015 [place], [place]. This information is not intended to replace advice given to you by your health care provider. Make sure you discuss any questions you have with your health care provider.

## 2014-06-24 NOTE — Progress Notes (Signed)
*  PRELIMINARY RESULTS* Vascular Ultrasound Carotid Duplex (Doppler) has been completed.  Preliminary findings: Bilateral:  1-39% ICA stenosis.  Vertebral artery flow is antegrade.      Farrel DemarkJill Eunice, RDMS, RVT  06/24/2014, 12:00 PM

## 2014-06-24 NOTE — Progress Notes (Signed)
Pt for discharge home today. Discharge orders received. IVs and telemetry dcd with dressing clean dry and intact. Discharge instructions given and patient advised to pick up 5 rxs. at [facility]. Verbalized understanding. Husband at bedside to assist with discharge. Advised that [facility] would send rolling walker and Care management has made referral to Outpatient rehab for PT. Nurse tech escorted patient to lobby via wheelchair at 1740. Transported to home by spouse.

## 2014-06-28 ENCOUNTER — Telehealth: Payer: Self-pay | Admitting: Neurology

## 2014-06-28 DIAGNOSIS — I619 Nontraumatic intracerebral hemorrhage, unspecified: Secondary | ICD-10-CM

## 2014-06-28 NOTE — Telephone Encounter (Signed)
Patients husband called and stated that Dr. Roda ShuttersXu saw the patient in the hospital and was supposed to place an order for a walker. They have not heard from anyone regarding the walker and would like to follow up on this request along with the request to receive physical therapy. Please call and advise.

## 2014-06-29 NOTE — Telephone Encounter (Signed)
Hi, Mary, I have put in these two orders. Could you please give a check to see if they are correct? Thanks.  Marvel PlanJindong Tyjon Bowen, MD PhD Stroke Neurology 06/29/2014 6:44 PM

## 2014-06-30 ENCOUNTER — Encounter: Payer: Self-pay | Admitting: Family Medicine

## 2014-06-30 ENCOUNTER — Telehealth: Payer: Self-pay | Admitting: *Deleted

## 2014-06-30 ENCOUNTER — Ambulatory Visit (INDEPENDENT_AMBULATORY_CARE_PROVIDER_SITE_OTHER): Payer: 59 | Admitting: Family Medicine

## 2014-06-30 ENCOUNTER — Telehealth: Payer: Self-pay | Admitting: Neurology

## 2014-06-30 VITALS — BP 142/78 | HR 70 | Temp 98.3°F | Ht 70.25 in | Wt 192.6 lb

## 2014-06-30 DIAGNOSIS — I10 Essential (primary) hypertension: Secondary | ICD-10-CM

## 2014-06-30 DIAGNOSIS — E785 Hyperlipidemia, unspecified: Secondary | ICD-10-CM

## 2014-06-30 DIAGNOSIS — I619 Nontraumatic intracerebral hemorrhage, unspecified: Secondary | ICD-10-CM | POA: Diagnosis not present

## 2014-06-30 DIAGNOSIS — E1165 Type 2 diabetes mellitus with hyperglycemia: Secondary | ICD-10-CM | POA: Diagnosis not present

## 2014-06-30 DIAGNOSIS — IMO0002 Reserved for concepts with insufficient information to code with codable children: Secondary | ICD-10-CM

## 2014-06-30 MED ORDER — SITAGLIPTIN PHOSPHATE 100 MG PO TABS: 100.0000 mg | ORAL_TABLET | Freq: Every day | ORAL | Status: DC

## 2014-06-30 MED ORDER — GLUCOSE BLOOD VI STRP: ORAL_STRIP | Status: DC

## 2014-06-30 MED ORDER — ONETOUCH DELICA LANCETS FINE MISC: Status: DC

## 2014-06-30 NOTE — Patient Instructions (Signed)

## 2014-06-30 NOTE — Telephone Encounter (Signed)
Spoke to the patient and spouse. Advised we have the order for a rolling walker for the patient and wanted to know if they [facility] or if they would like the order sent by mail/ or picked up to take to medical supply store. Patient and spouse [facility]

## 2014-06-30 NOTE — Progress Notes (Addendum)
Patient ID: Stephanie Lloyd, female    DOB: Sep 18, 1970  Age: 44 y.o. MRN: [id number]    Subjective:  Subjective HPI KAYIA BILLINGER presents to establish and hospital f/u. Pt d/c last week.  Pt had a hemorrhagic stroke. She stopped taking her bp med a year ago.  Denied ever having cp, headache, dizziness or nausea and vomiting.   She still has weakness but no new symptoms.    Review of Systems  Constitutional: Negative for diaphoresis, activity change, appetite change, fatigue and unexpected weight change.  HENT: Negative for congestion, ear pain, facial swelling, hearing loss, mouth sores, nosebleeds, rhinorrhea, sinus pressure and trouble swallowing.   Eyes: Negative for pain, redness and visual disturbance.  Respiratory: Negative for cough, chest tightness, shortness of breath and wheezing.   Cardiovascular: Negative for chest pain, palpitations and leg swelling.  Endocrine: Negative for cold intolerance, heat intolerance, polydipsia, polyphagia and polyuria.  Genitourinary: Negative for dysuria, frequency and difficulty urinating.  Musculoskeletal: Negative for myalgias, back pain, joint swelling, arthralgias, gait problem, neck pain and neck stiffness.  Skin: Negative for color change, pallor, rash and wound.  Allergic/Immunologic: Negative for environmental allergies and food allergies.  Neurological: Positive for weakness. Negative for dizziness, tremors, syncope, speech difficulty, light-headedness, numbness and headaches.  Hematological: Negative for adenopathy. Does not bruise/bleed easily.  Psychiatric/Behavioral: Negative for behavioral problems and dysphoric mood. The patient is not nervous/anxious.     History Past Medical History  Diagnosis Date  . Diabetes mellitus without complication   . Hypertension     She has no past surgical history on file.   Her family history is not on file.She reports that she has never smoked. She has never used smokeless tobacco. She  reports that she does not drink alcohol or use illicit drugs.  No current outpatient prescriptions on file prior to visit.   No current facility-administered medications on file prior to visit.     Objective:  Objective Physical Exam  Constitutional: She is oriented to person, place, and time. She appears well-developed and well-nourished. No distress.  HENT:  Head: Normocephalic and atraumatic.  Right Ear: External ear normal.  Left Ear: External ear normal.  Nose: Nose normal.  Mouth/Throat: Oropharynx is clear and moist.  Eyes: Conjunctivae and EOM are normal. Pupils are equal, round, and reactive to light.  Neck: Normal range of motion. Neck supple. No JVD present. Carotid bruit is not present. No thyromegaly present.  Cardiovascular: Normal rate and regular rhythm.   Murmur heard. Pulmonary/Chest: Effort normal and breath sounds normal. No respiratory distress. She has no wheezes. She has no rales. She exhibits no tenderness.  Musculoskeletal: She exhibits no edema.  Neurological: She is alert and oriented to person, place, and time. She has normal reflexes.  + left sided hemiparesis  Skin: Skin is warm and dry. No rash noted. No erythema. No pallor.  Psychiatric: She has a normal mood and affect. Her behavior is normal. Judgment and thought content normal.   BP 142/78 mmHg  Pulse 70  Temp(Src) 98.3 F (36.8 C) (Oral)  Ht 5' 10.25" (1.784 m)  Wt 192 lb 9.6 oz (87.363 kg)  BMI 27.45 kg/m2  SpO2 98% Wt Readings from Last 3 Encounters:  06/30/14 192 lb 9.6 oz (87.363 kg)     Lab Results  Component Value Date   GLUCOSE 417* 12/12/2011   NA 132* 12/12/2011   K 3.5 12/12/2011   CL 95* 12/12/2011   CREATININE 0.90 12/12/2011   BUN  14 12/12/2011   CO2 23 12/12/2011    No results found.   Assessment & Plan:  Plan I have discontinued Ms. Hack's metFORMIN. I am also having her start on [facility] L[facility] FINE and glucose blood.  Meds ordered this encounter   Medications  . DISCONTD: atorvastatin (LIPITOR) 40 MG tablet    Sig: Take 40 mg by mouth daily.  Marland Kitchen. DISCONTD: amLODipine (NORVASC) 10 MG tablet    Sig: Take 10 mg by mouth daily.  Marland Kitchen. DISCONTD: potassium chloride SA (K-DUR,KLOR-CON) 20 MEQ tablet    Sig: Take 20 mEq by mouth 2 (two) times daily.  Marland Kitchen. DISCONTD: sitaGLIPtin (JANUVIA) 100 MG tablet    Sig: Take 1 tablet (100 mg total) by mouth daily.    Dispense:  30 tablet    Refill:  3  . ONETOUCH DELICA LANCETS FINE MISC    Sig: Check blood sugar twice a day. Dx:E11.9    Dispense:  100 each    Refill:  prn  . glucose blood (ONETOUCH VERIO) test strip    Sig: Check blood sugar twice daily. Dx: E11.9    Dispense:  100 each    Refill:  prn    Onetouch Verio test strips    Problem List Items Addressed This Visit    Thalamic hemorrhage with stroke   Relevant Orders   Ambulatory referral to Home Health   Ambulatory referral to Neurology   Hypertension   Hyperlipidemia LDL goal <70    Other Visit Diagnoses    Diabetes mellitus type II, uncontrolled    -  Primary    Relevant Orders    Ambulatory referral to diabetic education       Follow-up: Return in about 3 months (around 09/30/2014) for lipid, hep, bmp, tsh, cbcd , ua, fasting, htn, hyperlipidemia, DM.  Loreen FreudYvonne Lowne, DO

## 2014-06-30 NOTE — Progress Notes (Signed)
Pre visit review using our clinic review tool, if applicable. No additional management support is needed unless otherwise documented below in the visit note. 

## 2014-06-30 NOTE — Telephone Encounter (Signed)
Stephanie Lloyd called and requested to speak to the nurse regarding some questions she has about a referral for a rolling walker for the patient. Please call and advise.

## 2014-07-01 ENCOUNTER — Telehealth: Payer: Self-pay | Admitting: Family Medicine

## 2014-07-01 NOTE — Telephone Encounter (Signed)
Spoke with patient and husband and made them aware that I can send the walker to another Medical supplier. He stated the walker was approved with the insurance company but the are sending by [employer] and she will not have it until next wee.I [facility] and [facility] pick it up from the office. He verbalized understanding and has agreed. I gave him the number.     KP

## 2014-07-01 NOTE — Telephone Encounter (Signed)
Caller name: Asencion Islammarva Relation to pt: Call back number: [phone] Pharmacy:  Reason for call:   Patient requesting Physical Therapy

## 2014-07-01 NOTE — Telephone Encounter (Signed)
Please advise      KP 

## 2014-07-01 NOTE — Telephone Encounter (Signed)
Caller name:Magaret Relationship to patient:self Can be reached:[phone] Pharmacy:  Reason for call:Request to speak to nurse about walker, states it will be next week before they can get it

## 2014-07-01 NOTE — Telephone Encounter (Signed)
Spoke with Stephanie Lloyd who states she now has all the information she needs.

## 2014-07-01 NOTE — Telephone Encounter (Signed)
I requested PT with home health --see order

## 2014-07-04 ENCOUNTER — Telehealth: Payer: Self-pay | Admitting: *Deleted

## 2014-07-04 MED ORDER — SAXAGLIPTIN HCL 5 MG PO TABS: 5.0000 mg | ORAL_TABLET | Freq: Every day | ORAL | Status: DC

## 2014-07-04 NOTE — Telephone Encounter (Signed)
Escribed

## 2014-07-04 NOTE — Telephone Encounter (Signed)
onglyza 5 mg #30  1 po qd 2 refills

## 2014-07-04 NOTE — Telephone Encounter (Signed)
Spoke with Tiffany and [facility] has not received the referral information. She will pick up the referral from the office.      KP

## 2014-07-04 NOTE — Telephone Encounter (Signed)
PA for Januvia denied. Alternatives include Nesina, Tradjenta and [name]. Please advise. JG//CMA

## 2014-07-04 NOTE — Telephone Encounter (Signed)
Prior authorization for Januvia initiated. Awaiting determination. JG//CMA  

## 2014-07-04 NOTE — Addendum Note (Signed)
Addended by: Amado CoeGLOVER, Mckoy Bhakta A on: 07/04/2014 12:09 PM   Modules accepted: Orders, Medications

## 2014-07-05 ENCOUNTER — Telehealth: Payer: Self-pay | Admitting: Family Medicine

## 2014-07-05 NOTE — Telephone Encounter (Signed)
Stephanie Lloyd advised the patient will not be able to receive their services but [name] will accept her insurance but will not be able to see her until Friday. The patient verbalized understanding and agreed to the services from [facility].     KP

## 2014-07-05 NOTE — Telephone Encounter (Signed)
Caller name: Tiffany from [facility] Relation to pt: Call back number: [phone] Pharmacy:  Reason for call:   Would like to talk to kim regarding referral for this patient.

## 2014-07-08 ENCOUNTER — Ambulatory Visit: Payer: Managed Care, Other (non HMO) | Admitting: Family Medicine

## 2014-07-12 ENCOUNTER — Telehealth: Payer: Self-pay | Admitting: Family Medicine

## 2014-07-12 DIAGNOSIS — I619 Nontraumatic intracerebral hemorrhage, unspecified: Secondary | ICD-10-CM | POA: Diagnosis not present

## 2014-07-12 DIAGNOSIS — E785 Hyperlipidemia, unspecified: Secondary | ICD-10-CM | POA: Diagnosis not present

## 2014-07-12 DIAGNOSIS — R278 Other lack of coordination: Secondary | ICD-10-CM | POA: Diagnosis not present

## 2014-07-12 DIAGNOSIS — E1165 Type 2 diabetes mellitus with hyperglycemia: Secondary | ICD-10-CM | POA: Diagnosis not present

## 2014-07-12 NOTE — Telephone Encounter (Signed)
Caller name: Rodena Goldmann, PT with [facility] Can be reached: [phone]  Reason for call: Needs order for PT, 1x for week 1, 2x for 3 weeks, beginning 07/08/14. Needs call for verbal order.

## 2014-07-12 NOTE — Telephone Encounter (Signed)
Caller name: Rhunette Croft with [facility] Can be reached: [phone]   Reason for call: [facility] Plan of Care OT eval completed 07/08/14 Frequency 1 x week for 1 week, 2 x week for 2 weeks, 1 x week for 1 week, 0 x week for 1 week, 1 x week for 1 week For transfers, assisted device training, ADL, IADL, fine motor, balance, and home exercise program No need to return call unless disagreement with plan of care.

## 2014-07-12 NOTE — Telephone Encounter (Signed)
Ok with plan

## 2014-07-12 NOTE — Telephone Encounter (Signed)
To MD to review.     KP 

## 2014-07-12 NOTE — Telephone Encounter (Signed)
Verbal given.     KP 

## 2014-07-26 ENCOUNTER — Telehealth: Payer: Self-pay | Admitting: Family Medicine

## 2014-07-26 MED ORDER — POTASSIUM CHLORIDE CRYS ER 20 MEQ PO TBCR: 20.0000 meq | EXTENDED_RELEASE_TABLET | Freq: Two times a day (BID) | ORAL | Status: DC

## 2014-07-26 MED ORDER — LISINOPRIL 20 MG PO TABS: 20.0000 mg | ORAL_TABLET | Freq: Every day | ORAL | Status: DC

## 2014-07-26 MED ORDER — AMLODIPINE BESYLATE 10 MG PO TABS: 10.0000 mg | ORAL_TABLET | Freq: Every day | ORAL | Status: DC

## 2014-07-26 MED ORDER — ATORVASTATIN CALCIUM 40 MG PO TABS: 40.0000 mg | ORAL_TABLET | Freq: Every day | ORAL | Status: DC

## 2014-07-26 NOTE — Telephone Encounter (Signed)
Verbal order given.    KP

## 2014-07-26 NOTE — Telephone Encounter (Signed)
Patient is out of med's as well. Med's sent.     KP

## 2014-07-26 NOTE — Telephone Encounter (Signed)
Caller name: Stephanie Lloyd with [facility] Can be reached: [phone]   Reason for call: Stephanie Lloyd would like a call back to obtain orders for a medical social worker to come to pts home to provide information on community resources. He states the pt is having difficulty getting medications regularly. The patient has been out of Lisinopril and Amlodipine since Sunday he believes. She states that she has to wait until the end of the week for the husband to get his check before she can go to the pharmacy for medications. He also asked if we have access to samples to provide the patient for the week so she does not go without. He states that pt previously had a stroke and he is greatly concerned that she is out of her medications.

## 2014-07-28 ENCOUNTER — Telehealth: Payer: Self-pay | Admitting: Family Medicine

## 2014-07-28 NOTE — Telephone Encounter (Signed)
Relation to pt: self Call back number: [phone] [id number] [place], [place] - [address] [phone] (Phone) [phone] (Fax)         Reason for call:  Pt requesting to change sitaGLIPtin (JANUVIA) to tradjenta or onglyza.please advise

## 2014-07-28 NOTE — Telephone Encounter (Signed)
error 

## 2014-07-28 NOTE — Telephone Encounter (Signed)
onglyza 5 mg 1 po qd #30  5 refills D/c januvia

## 2014-07-28 NOTE — Telephone Encounter (Signed)
[name] was faxed on 07/04/14, but the patient was not notified. I made her aware to call the pharmacy to pick up script.Stephanie Lloyd.   KP

## 2014-07-28 NOTE — Telephone Encounter (Signed)
Please advise      KP 

## 2014-07-29 ENCOUNTER — Encounter: Payer: Self-pay | Admitting: Family Medicine

## 2014-07-29 DIAGNOSIS — E785 Hyperlipidemia, unspecified: Secondary | ICD-10-CM | POA: Insufficient documentation

## 2014-07-29 DIAGNOSIS — I619 Nontraumatic intracerebral hemorrhage, unspecified: Secondary | ICD-10-CM | POA: Insufficient documentation

## 2014-07-29 NOTE — Addendum Note (Signed)
Addended by: Lelon PerlaLOWNE, Kamaree Wheatley R on: 07/29/2014 01:25 PM   Modules accepted: Level of Service

## 2014-08-04 ENCOUNTER — Telehealth: Payer: Self-pay | Admitting: *Deleted

## 2014-08-04 NOTE — Telephone Encounter (Signed)
Pt dropped off paperwork (Physician's Statement for disability claim). Filled out as much as possible and forwarded to Dr. Laury AxonLowne. JG//CMA

## 2014-08-08 ENCOUNTER — Telehealth: Payer: Self-pay | Admitting: Family Medicine

## 2014-08-08 DIAGNOSIS — I619 Nontraumatic intracerebral hemorrhage, unspecified: Secondary | ICD-10-CM

## 2014-08-08 NOTE — Telephone Encounter (Signed)
Faxed successfully. Sent for scanning. JG//CMA 

## 2014-08-08 NOTE — Telephone Encounter (Signed)
Spoke with patient and she is still having weakness on the left side due to her stroke. She got a call from Rehab but she was getting home PT/OT and wants to continue rehab due to increased falls and left sided weakness. Ok for Neuro rehab per D.Lowne. Order in.       [place]

## 2014-08-08 NOTE — Telephone Encounter (Signed)
Caller name: Asencion Islammarva Relation to pt: Call back number: [phone] Pharmacy:  Reason for call:   Patient is questioning if she still needs to go to outpatient rehab?

## 2014-08-09 ENCOUNTER — Encounter: Payer: Self-pay | Admitting: Family Medicine

## 2014-08-11 ENCOUNTER — Telehealth: Payer: Self-pay | Admitting: Family Medicine

## 2014-08-11 ENCOUNTER — Ambulatory Visit: Payer: 59 | Admitting: Dietician

## 2014-08-11 NOTE — Telephone Encounter (Signed)
[facility]

## 2014-08-11 NOTE — Telephone Encounter (Signed)
Caller name: wanda from [facility] Relation to pt: Call back number: [phone] Pharmacy:  Reason for call:   Calling in regarding an outstanding order from 6/10. Needs to be faxed over to her. Fax: [phone]

## 2014-08-16 ENCOUNTER — Telehealth: Payer: Self-pay | Admitting: *Deleted

## 2014-08-16 NOTE — Telephone Encounter (Signed)
Completed/signed disability Physician's Statement mailed to [employer], [address] [place], [place] [id number] (per pt request). Copy sent for scanning. JG//CMA

## 2014-08-19 ENCOUNTER — Encounter: Payer: Self-pay | Admitting: Dietician

## 2014-08-19 ENCOUNTER — Encounter: Payer: 59 | Attending: Family Medicine | Admitting: Dietician

## 2014-08-19 VITALS — Ht 70.0 in | Wt 190.0 lb

## 2014-08-19 DIAGNOSIS — Z713 Dietary counseling and surveillance: Secondary | ICD-10-CM | POA: Diagnosis not present

## 2014-08-19 DIAGNOSIS — E118 Type 2 diabetes mellitus with unspecified complications: Secondary | ICD-10-CM | POA: Diagnosis present

## 2014-08-19 NOTE — Patient Instructions (Signed)
Plan:  Aim for 3-4 Carb Choices per meal (45-60 grams) +/- 1 either way  Aim for 0-2 Carbs per snack if hungry  Include protein in moderation with your meals and snacks Consider reading food labels for Total Carbohydrate and Fat Grams of foods Consider  increasing your activity level by walking for 15 minutes daily as tolerated and increase to 30 minutes most days Consider checking BG at alternate times per day as directed by MD  Consider taking medication as directed by MD

## 2014-08-19 NOTE — Progress Notes (Signed)
Diabetes Self-Management Education  Visit Type: First/Initial  Appt. Start Time: 1300 Appt. End Time: 1430  08/19/2014  [name], identified by name and date of birth, is a 44 y.o. female with a diagnosis of Diabetes: Type 2 (since pregnancy with son 20 years ago).  Other people present during visit:  Patient   Patient is here alone.  She had a thalamic hemorrhage 2 months ago.  Hx includes HTN, type 2 diabetes for 20 years since pregnancy with son, and hyperlipidemia.  She did not tolerate Metformin and would like to learn how to improve her blood sugar control and lose weight.  She lives with her husband.  He does the shopping and cooking at this time.  She is currently unemployed since the stroke and used to work in housekeeping.  Wt 20 years ago of 130 lbs.  We discussed a healthy weight for height of 160 lbs. Cholesterol 220, Triglyceride 41, HDL 72, LDL 140.   They struggle to have enough money for food at times.  ASSESSMENT  Height 5\' 10"  (1.778 m), weight 190 lb (86.183 kg). Body mass index is 27.26 kg/(m^2).  Initial Visit Information:  Are you currently following a meal plan?: No (trying to avoid fried and high fat foods)   Are you taking your medications as prescribed?: Yes Are you checking your feet?: Yes How many days per week are you checking your feet?: 2 How often do you need to have someone help you when you read instructions, pamphlets, or other written materials from your doctor or pharmacy?: 3 - Sometimes (due to vision) What is the last grade level you completed in school?: 12 grade HS  Psychosocial:   Patient Belief/Attitude about Diabetes: Motivated to manage diabetes Self-care barriers: Other (comment) (Thalamic hemorrhage secondary to HTN 2 months ago) Self-management support: Doctor's office, Friends Other persons present: Patient Patient Concerns: Nutrition/Meal planning, Healthy Lifestyle, Glycemic Control, Weight Control Special Needs: Large  print Preferred Learning Style: No preference indicated Learning Readiness: Ready  Complications:   Last HgB A1C per patient/outside source: 9 mg/dL (05/07/79) How often do you check your blood sugar?: 1-2 times/day Fasting Blood glucose range (mg/dL): >191 Postprandial Blood glucose range (mg/dL): 478-295 Number of hypoglycemic episodes per month: 0 Number of hyperglycemic episodes per week: 10 Can you tell when your blood sugar is high?: No Have you had a dilated eye exam in the past 12 months?: No Have you had a dental exam in the past 12 months?: No  Diet Intake:  Breakfast: scrambled eggs, with bacon OR oatmeal with butter, 1 1/2 tablespoons sugar and milk and occasional fruit (12pm-1pm) Snack (morning): none Lunch: occasional sandwich:  cheese and mayo on [employer] bread (1pm) Snack (afternoon): rare Dinner: baked or grilled chicken, vegetable, rice or potato, fruit and occasional sugar free ice cream (6-7) Snack (evening): occasional peanut butter sandwich Beverage(s): water, diet gingerale, swet tea occasionally, coffee with 1 Tablespoons sugar and creamer  Exercise:  Exercise: ADL's (does exercies provided by PT)  Individualized Plan for Diabetes Self-Management Training:   Learning Objective:  Patient will have a greater understanding of diabetes self-management.  Patient education plan per assessed needs and concerns is to attend individual sessions for     Education Topics Reviewed with Patient Today:  Definition of diabetes, type 1 and 2, and the diagnosis of diabetes Role of diet in the treatment of diabetes and the relationship between the three main macronutrients and blood glucose level, Food label reading, portion sizes and  measuring food., Carbohydrate counting Role of exercise on diabetes management, blood pressure control and cardiac health., Helped patient identify appropriate exercises in relation to his/her diabetes, diabetes complications and other health  issue.       Relationship between chronic complications and blood glucose control, Dental care, Retinopathy and reason for yearly dilated eye exams Worked with patient to identify barriers to care and solutions, Role of stress on diabetes, Identified and addressed patients feelings and concerns about diabetes   Lifestyle issues that need to be addressed for better diabetes care  PATIENTS GOALS/Plan (Developed by the patient):  Nutrition: General guidelines for healthy choices and portions discussed, Follow meal plan discussed Physical Activity: Exercise 5-7 days per week, 30 minutes per day Medications: take my medication as prescribed Monitoring : test my blood glucose as discussed (note x per day with comment) (twice a day)  Plan:   Patient Instructions  Plan:  Aim for 3-4 Carb Choices per meal (45-60 grams) +/- 1 either way  Aim for 0-2 Carbs per snack if hungry  Include protein in moderation with your meals and snacks Consider reading food labels for Total Carbohydrate and Fat Grams of foods Consider  increasing your activity level by walking for 15 minutes daily as tolerated and increase to 30 minutes most days Consider checking BG at alternate times per day as directed by MD  Consider taking medication as directed by MD    Expected Outcomes:  Demonstrated interest in learning. Expect positive outcomes  Education material provided: Living Well with Diabetes, Food label handouts, A1C conversion sheet, Meal plan card, My Plate, Snack sheet and Support group flyer, Food pantry and free meal sites.  If problems or questions, patient to contact team via:  Phone  Future DSME appointment: PRN

## 2014-08-23 ENCOUNTER — Ambulatory Visit: Payer: 59 | Admitting: Neurology

## 2014-08-24 ENCOUNTER — Ambulatory Visit: Payer: Self-pay | Admitting: Neurology

## 2014-09-01 ENCOUNTER — Telehealth: Payer: Self-pay | Admitting: *Deleted

## 2014-09-01 NOTE — Telephone Encounter (Signed)
Pt dropped off claim form for continuing disability benefits. Form filled out as much as possible and forwarded to Dr. Laury Axon. JG//CMA

## 2014-09-06 NOTE — Telephone Encounter (Signed)
Completed form mailed. Copy sent for scanning. JG//CMA

## 2014-09-21 ENCOUNTER — Ambulatory Visit (INDEPENDENT_AMBULATORY_CARE_PROVIDER_SITE_OTHER): Payer: 59 | Admitting: Neurology

## 2014-09-21 ENCOUNTER — Encounter: Payer: Self-pay | Admitting: Neurology

## 2014-09-21 VITALS — BP 116/73 | HR 75 | Ht 70.0 in | Wt 194.2 lb

## 2014-09-21 DIAGNOSIS — E785 Hyperlipidemia, unspecified: Secondary | ICD-10-CM | POA: Diagnosis not present

## 2014-09-21 DIAGNOSIS — I610 Nontraumatic intracerebral hemorrhage in hemisphere, subcortical: Secondary | ICD-10-CM

## 2014-09-21 DIAGNOSIS — E1159 Type 2 diabetes mellitus with other circulatory complications: Secondary | ICD-10-CM | POA: Diagnosis not present

## 2014-09-21 DIAGNOSIS — I10 Essential (primary) hypertension: Secondary | ICD-10-CM | POA: Insufficient documentation

## 2014-09-21 NOTE — Progress Notes (Signed)
STROKE NEUROLOGY FOLLOW UP NOTE  NAME: Stephanie Lloyd DOB: [birth year]  REASON FOR VISIT: stroke follow up HISTORY FROM: pt and chart  Today we had the pleasure of seeing Stephanie Lloyd in follow-up at our [facility]rology [facility]. Pt was accompanied by husband.   History Summary Stephanie Lloyd is a 44 y.o. female with PMH of HTN, DM was admitted on 06/22/14 for acute onset of left sided weakness, slurry speech and dysphagia. 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 She was admitted to neuro ICU with Cardene drip and repeat CT head secondary showed stable hematoma. Other stroke workup showed negative 2-D echo and carotid Doppler, however LDL 140 and A1c 9.0. She was started on Lipitor. She also had elevated creatinine. Her BP was further controlled by po meds. After stabilization, patient was discharged home with PCP follow-up in one week.   Interval History During the interval time, the patient has been doing well. Initially she needs walker for walking, but right now she is able to walk without any device. Left-sided weakness much resolved with only mild left hand dexterity difficulties. Her blood pressure today 116/73, and she reported compliant with medication. She has his pediatrics follow-up next months. She admitted snoring at night, however denies apnea. She declined a sleep study.  REVIEW OF SYSTEMS: Full 14 system review of systems performed and notable only for those listed below and in HPI above, all others are negative:  Constitutional:  Fatigue Cardiovascular: Murmur Ear/Nose/Throat:   Skin:  Eyes:  Blurry vision Respiratory:  SOB Gastroitestinal:   Genitourinary:  Hematology/Lymphatic:   Endocrine:  Musculoskeletal:   Allergy/Immunology:  Allergies Neurological:  Confusion, numbness, weakness, slurry speech Psychiatric:  Sleep: Insomnia, sleepiness, snoring  The following represents the patient's updated allergies and side effects  list: Allergies  Allergen Reactions  . Metformin And Related Diarrhea    The neurologically relevant items on the patient's problem list were reviewed on today's visit.  Neurologic Examination  A problem focused neurological exam (12 or more points of the single system neurologic examination, vital signs counts as 1 point, cranial nerves count for 8 points) was performed.  Blood pressure 116/73, pulse 75, height 5\' 10"  (1.778 m), weight 194 lb 3.2 oz (88.089 kg).  General - Well nourished, well developed, in no apparent distress.  Ophthalmologic - Sharp disc margins OU.  Cardiovascular - Regular rate and rhythm with no murmur.  Mental Status -  Level of arousal and orientation to time, place, and person were intact. Language including expression, naming, repetition, comprehension was assessed and found intact. Fund of Knowledge was assessed and was intact.  Cranial Nerves II - XII - II - Visual field intact OU. III, IV, VI - Extraocular movements intact. V - Facial sensation intact bilaterally. VII - Facial movement intact bilaterally. VIII - Hearing & vestibular intact bilaterally. X - Palate elevates symmetrically. XI - Chin turning & shoulder shrug intact bilaterally. XII - Tongue protrusion intact.  Motor Strength - The patient's strength was normal in all extremities except very mild left hand dexterity difficulty and pronator drift was absent.  Bulk was normal and fasciculations were absent.   Motor Tone - Muscle tone was assessed at the neck and appendages and was normal.  Reflexes - The patient's reflexes were 1+ in all extremities and she had no pathological reflexes.  Sensory - Light touch, temperature/pinprick were assessed and were normal.    Coordination - The patient had normal movements  in the hands and feet with no ataxia or dysmetria.  Tremor was absent.  Gait and Station - The patient's transfers, posture, gait, station, and turns were observed as  normal.  Data reviewed: I personally reviewed the images and agree with the radiology interpretations.  Ct Head Wo Contrast  06/23/2014 IMPRESSION: 13 mm hemorrhage right thalamus with mild local mass-effect. No ventricular extension Atrophy and chronic microvascular ischemia.   06/22/2014 IMPRESSION: 1. Acute approximately 1.5 cm right thalamic intraparenchymal hemorrhage without intraventricular extension or significant mass effect. While potentially hypertensive in etiology, given extensive age advanced microvascular ischemic disease, hemorrhagic conversion of a microvascular infarct could have a similar appearance. As such, further evaluation with brain MRI is recommended. 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   Carotid Doppler - Bilateral: 1-39% ICA stenosis. Vertebral artery flow is antegrade.  2D Echocardiogram - Left ventricle: The cavity size was normal. Wall thickness was increased in a pattern of mild LVH. Systolic function was normal. The estimated ejection fraction was in the range of 60% to 65%. Wall motion was normal; there were no regional wall motion abnormalities. Features are consistent with a pseudonormal left ventricular filling pattern, with concomitant abnormal relaxation and increased filling pressure (grade 2 diastolic dysfunction). Doppler parameters are consistent with high ventricular filling pressure. - Atrial septum: No defect or patent foramen ovale was identified. Impressions: - No cardiac source of emboli was indentified.  Component     Latest Ref Rng 06/23/2014  Cholesterol     0 - 200 mg/dL 960 (H)  Triglycerides     <150 mg/dL 41  HDL Cholesterol     >40 mg/dL 72  Total CHOL/HDL Ratio      3.1  VLDL     0 - 40 mg/dL 8  LDL (calc)     0 - 99 mg/dL 454 (H)  Hemoglobin U9W     4.8 - 5.6 % 9.0 (H)  Mean Plasma Glucose      212  TSH     0.350 - 4.500 uIU/mL 0.548    Assessment: As you may recall,  she is a 44 y.o. African American female with PMH of HTN and DM was admitted on 06/22/14 for right thalamic small ICH. She was admitted to neuro ICU with Cardene drip and repeat CT head secondary showed stable hematoma. LDL 140 and A1c 9.0. She was started on Lipitor. She also had elevated creatinine. Her BP was further controlled by po meds. Since discharge, she was doing well, left-sided weakness much improved. Will repeat imaging study and consider aspirin if blood all absorbed.  Plan:  - MRI and MRA to evaluate hemorrhage resolution - will consider baby ASA if hemorrhage resolves - continue lipitor for stroke prevention - check BP and glucose at home - Follow up with your primary care physician for stroke risk factor modification. Recommend maintain blood pressure goal <130/80, diabetes with hemoglobin A1c goal below 6.5% and lipids with LDL cholesterol goal below 70 mg/dL.  - RTC in 3 months   Orders Placed This Encounter  Procedures  . MR Brain Wo Contrast    Standing Status: Future     Number of Occurrences:      Standing Expiration Date: 11/23/2015    Order Specific Question:  Reason for Exam (SYMPTOM  OR DIAGNOSIS REQUIRED)    Answer:  ICH follow up    Order Specific Question:  Preferred imaging location?    Answer:  Internal    Order Specific Question:  Does the patient have a pacemaker or implanted devices?    Answer:  No    Order Specific Question:  What is the patient's sedation requirement?    Answer:  No Sedation  . MR MRA HEAD WO CONTRAST    Standing Status: Future     Number of Occurrences:      Standing Expiration Date: 11/23/2015    Order Specific Question:  Reason for Exam (SYMPTOM  OR DIAGNOSIS REQUIRED)    Answer:  ICH follow up    Order Specific Question:  Preferred imaging location?    Answer:  Internal    Order Specific Question:  Does the patient have a pacemaker or implanted devices?    Answer:  No    Order Specific Question:  What is the patient's sedation  requirement?    Answer:  No Sedation    No orders of the defined types were placed in this encounter.    Patient Instructions  - will do MRI to evaluate blood absorption - will consider baby ASA if blood all absorbed - continue lipitor for stroke prevention - check BP and glucose at home - Follow up with your primary care physician for stroke risk factor modification. Recommend maintain blood pressure goal <130/80, diabetes with hemoglobin A1c goal below 6.5% and lipids with LDL cholesterol goal below 70 mg/dL.  - recommend to check A1C, lipid panel, CBC, BMP next visit to PCP. - self exercise of left hand - follow up in 3 months.    Marvel Plan, MD PhD [facility] [address], [address] [place], [place] [address] [phone]

## 2014-09-21 NOTE — Patient Instructions (Signed)
-   will do MRI to evaluate blood absorption - will consider baby ASA if blood all absorbed - continue lipitor for stroke prevention - check BP and glucose at home - Follow up with your primary care physician for stroke risk factor modification. Recommend maintain blood pressure goal <130/80, diabetes with hemoglobin A1c goal below 6.5% and lipids with LDL cholesterol goal below 70 mg/dL.  - recommend to check A1C, lipid panel, CBC, BMP next visit to PCP. - self exercise of left hand - follow up in 3 months.

## 2014-09-27 ENCOUNTER — Telehealth: Payer: Self-pay

## 2014-09-27 ENCOUNTER — Other Ambulatory Visit: Payer: Self-pay

## 2014-09-27 ENCOUNTER — Emergency Department (HOSPITAL_BASED_OUTPATIENT_CLINIC_OR_DEPARTMENT_OTHER)
Admission: EM | Admit: 2014-09-27 | Discharge: 2014-09-27 | Disposition: A | Discharge status: Home / Self Care | Payer: 59 | Attending: Emergency Medicine | Admitting: Emergency Medicine

## 2014-09-27 ENCOUNTER — Encounter (HOSPITAL_BASED_OUTPATIENT_CLINIC_OR_DEPARTMENT_OTHER): Payer: Self-pay | Admitting: *Deleted

## 2014-09-27 ENCOUNTER — Encounter: Payer: Self-pay | Admitting: Family Medicine

## 2014-09-27 ENCOUNTER — Ambulatory Visit (INDEPENDENT_AMBULATORY_CARE_PROVIDER_SITE_OTHER): Payer: 59 | Admitting: Family Medicine

## 2014-09-27 ENCOUNTER — Emergency Department (HOSPITAL_BASED_OUTPATIENT_CLINIC_OR_DEPARTMENT_OTHER): Payer: 59

## 2014-09-27 ENCOUNTER — Telehealth: Payer: Self-pay | Admitting: Family Medicine

## 2014-09-27 VITALS — BP 118/80 | HR 64 | Temp 97.7°F | Wt 190.0 lb

## 2014-09-27 DIAGNOSIS — Z862 Personal history of diseases of the blood and blood-forming organs and certain disorders involving the immune mechanism: Secondary | ICD-10-CM | POA: Insufficient documentation

## 2014-09-27 DIAGNOSIS — E119 Type 2 diabetes mellitus without complications: Secondary | ICD-10-CM | POA: Diagnosis not present

## 2014-09-27 DIAGNOSIS — I10 Essential (primary) hypertension: Secondary | ICD-10-CM | POA: Insufficient documentation

## 2014-09-27 DIAGNOSIS — Z79899 Other long term (current) drug therapy: Secondary | ICD-10-CM | POA: Insufficient documentation

## 2014-09-27 DIAGNOSIS — I619 Nontraumatic intracerebral hemorrhage, unspecified: Secondary | ICD-10-CM

## 2014-09-27 DIAGNOSIS — R4781 Slurred speech: Secondary | ICD-10-CM | POA: Insufficient documentation

## 2014-09-27 DIAGNOSIS — E1159 Type 2 diabetes mellitus with other circulatory complications: Secondary | ICD-10-CM | POA: Diagnosis not present

## 2014-09-27 DIAGNOSIS — E1151 Type 2 diabetes mellitus with diabetic peripheral angiopathy without gangrene: Secondary | ICD-10-CM

## 2014-09-27 DIAGNOSIS — E785 Hyperlipidemia, unspecified: Secondary | ICD-10-CM | POA: Diagnosis not present

## 2014-09-27 DIAGNOSIS — IMO0002 Reserved for concepts with insufficient information to code with codable children: Secondary | ICD-10-CM

## 2014-09-27 DIAGNOSIS — E1165 Type 2 diabetes mellitus with hyperglycemia: Principal | ICD-10-CM

## 2014-09-27 DIAGNOSIS — R4701 Aphasia: Secondary | ICD-10-CM | POA: Diagnosis present

## 2014-09-27 LAB — CBC
HEMATOCRIT: 38.5 % (ref 36.0–46.0)
Hemoglobin: 12.6 g/dL (ref 12.0–15.0)
MCH: 26.3 pg (ref 26.0–34.0)
MCHC: 32.7 g/dL (ref 30.0–36.0)
MCV: 80.2 fL (ref 78.0–100.0)
Platelets: 235 10*3/uL (ref 150–400)
RBC: 4.8 MIL/uL (ref 3.87–5.11)
RDW: 13.4 % (ref 11.5–15.5)
WBC: 7.7 10*3/uL (ref 4.0–10.5)

## 2014-09-27 LAB — DIFFERENTIAL
BASOS PCT: 0 % (ref 0–1)
Basophils Absolute: 0 10*3/uL (ref 0.0–0.1)
EOS ABS: 0.3 10*3/uL (ref 0.0–0.7)
Eosinophils Relative: 3 % (ref 0–5)
LYMPHS ABS: 1.6 10*3/uL (ref 0.7–4.0)
Lymphocytes Relative: 20 % (ref 12–46)
MONO ABS: 0.5 10*3/uL (ref 0.1–1.0)
MONOS PCT: 7 % (ref 3–12)
Neutro Abs: 5.3 10*3/uL (ref 1.7–7.7)
Neutrophils Relative %: 70 % (ref 43–77)

## 2014-09-27 LAB — TROPONIN I

## 2014-09-27 LAB — PROTIME-INR
INR: 1.12 (ref 0.00–1.49)
Prothrombin Time: 14.6 seconds (ref 11.6–15.2)

## 2014-09-27 LAB — COMPREHENSIVE METABOLIC PANEL
ALT: 19 U/L (ref 14–54)
AST: 19 U/L (ref 15–41)
Albumin: 4.2 g/dL (ref 3.5–5.0)
Alkaline Phosphatase: 116 U/L (ref 38–126)
Anion gap: 9 (ref 5–15)
BILIRUBIN TOTAL: 0.6 mg/dL (ref 0.3–1.2)
BUN: 23 mg/dL — ABNORMAL HIGH (ref 6–20)
CHLORIDE: 104 mmol/L (ref 101–111)
CO2: 26 mmol/L (ref 22–32)
CREATININE: 1.43 mg/dL — AB (ref 0.44–1.00)
Calcium: 9.6 mg/dL (ref 8.9–10.3)
GFR, EST AFRICAN AMERICAN: 50 mL/min — AB (ref >60)
GFR, EST NON AFRICAN AMERICAN: 43 mL/min — AB (ref >60)
Glucose, Bld: 175 mg/dL — ABNORMAL HIGH (ref 65–99)
POTASSIUM: 4.2 mmol/L (ref 3.5–5.1)
Sodium: 139 mmol/L (ref 135–145)
TOTAL PROTEIN: 7.9 g/dL (ref 6.5–8.1)

## 2014-09-27 LAB — APTT: aPTT: 29 seconds (ref 24–37)

## 2014-09-27 MED ORDER — LISINOPRIL 20 MG PO TABS: 20.0000 mg | ORAL_TABLET | Freq: Every day | ORAL | Status: DC

## 2014-09-27 MED ORDER — SAXAGLIPTIN HCL 5 MG PO TABS: 5.0000 mg | ORAL_TABLET | Freq: Every day | ORAL | Status: DC

## 2014-09-27 MED ORDER — METOPROLOL TARTRATE 37.5 MG PO TABS: 37.5000 mg | ORAL_TABLET | Freq: Two times a day (BID) | ORAL | Status: DC

## 2014-09-27 NOTE — Telephone Encounter (Signed)
I tried to call back but Katrina was seeing patient's at this time.     KP

## 2014-09-27 NOTE — ED Notes (Signed)
Was seen by her MD upstairs this am for slurred speech for the past 5 days. She was brought here for further evaluation to r/o new CVA.

## 2014-09-27 NOTE — Telephone Encounter (Signed)
Nurse receive message from phone staff, pt was at her primary exhibiting slurred speech and weakness. Nurse call patients husband and he stated pt was now in the ED at [place] getting work up for stroke. Rn told pts husband to call back for a follow up appt with Dr.Xu of patients progress.

## 2014-09-27 NOTE — Telephone Encounter (Signed)
Caller name: Katrina  Relation to pt: Nurse from Dr. Roda Shutters   Call back number: [phone]  :  Reason for call:  Returning your call regarding patient office visit today. Nurse will be in office until 6:30pm. Nurse stated will try to call you back

## 2014-09-27 NOTE — Telephone Encounter (Signed)
Rn called [facility] in [facility] at [phone] to discuss pt who was seen by her primary doctor.Kim(CMA) who saw pt was not available. Rn left contact number for KIm to call back to discuss patient.

## 2014-09-27 NOTE — Progress Notes (Signed)
Pre visit review using our clinic review tool, if applicable. No additional management support is needed unless otherwise documented below in the visit note. 

## 2014-09-27 NOTE — ED Notes (Signed)
Pt. Walks well around nurses station with no difficulty.

## 2014-09-27 NOTE — Progress Notes (Signed)
Patient ID: Stephanie Lloyd, female    DOB: [birth year]  Age: 44 y.o. MRN: [id number]    Subjective:  Subjective HPI Stephanie Lloyd presents for f/u cva, dm, cholesterol and htn.  She saw neuro last wed and Thursday she noticed her speech was slurred again.  Her husband is also present and feels she has had another stroke.  No worsening of weakness on left.  She still occasionally drops things but that had improved up until Thursday.    Review of Systems  Constitutional: Negative for diaphoresis, appetite change, fatigue and unexpected weight change.  Eyes: Negative for pain, redness and visual disturbance.  Respiratory: Negative for cough, chest tightness, shortness of breath and wheezing.   Cardiovascular: Negative for chest pain, palpitations and leg swelling.  Endocrine: Negative for cold intolerance, heat intolerance, polydipsia, polyphagia and polyuria.  Genitourinary: Negative for dysuria, frequency and difficulty urinating.  Neurological: Positive for speech difficulty and weakness. Negative for dizziness, light-headedness, numbness and headaches.    History Past Medical History  Diagnosis Date  . Immune deficiency disorder   . Diabetes   . Diabetes mellitus without complication   . Hypertension     She has no past surgical history on file.   Her family history is not on file.She reports that she has never smoked. She has never used smokeless tobacco. She reports that she does not drink alcohol or use illicit drugs.  Current Outpatient Prescriptions on File Prior to Visit  Medication Sig Dispense Refill  . amLODipine (NORVASC) 10 MG tablet Take 1 tablet (10 mg total) by mouth daily. 90 tablet 1  . atorvastatin (LIPITOR) 40 MG tablet Take 1 tablet (40 mg total) by mouth daily. 90 tablet 1  . glucose blood (ONETOUCH VERIO) test strip Check blood sugar twice daily. Dx: E11.9 100 each prn  . ONETOUCH DELICA LANCETS FINE MISC Check blood sugar twice a day. Dx:E11.9 100 each prn   . potassium chloride SA (K-DUR,KLOR-CON) 20 MEQ tablet Take 1 tablet (20 mEq total) by mouth 2 (two) times daily. 60 tablet 5   No current facility-administered medications on file prior to visit.     Objective:  Objective Physical Exam  Constitutional: She is oriented to person, place, and time. She appears well-developed and well-nourished.  HENT:  Head: Normocephalic and atraumatic.  Eyes: Conjunctivae and EOM are normal.  Neck: Normal range of motion. Neck supple. No JVD present. Carotid bruit is not present. No thyromegaly present.  Cardiovascular: Normal rate, regular rhythm and normal heart sounds.   No murmur heard. Pulmonary/Chest: Effort normal and breath sounds normal. No respiratory distress. She has no wheezes. She has no rales. She exhibits no tenderness.  Musculoskeletal: She exhibits no edema.  Neurological: She is alert and oriented to person, place, and time.  Psychiatric: She has a normal mood and affect. Her behavior is normal. Judgment and thought content normal.   BP 118/80 mmHg  Pulse 64  Temp(Src) 97.7 F (36.5 C) (Oral)  Wt 190 lb (86.183 kg)  SpO2 98% Wt Readings from Last 3 Encounters:  09/27/14 190 lb (86.183 kg)  09/27/14 190 lb (86.183 kg)  09/21/14 194 lb 3.2 oz (88.089 kg)     Lab Results  Component Value Date   WBC 7.7 09/27/2014   HGB 12.6 09/27/2014   HCT 38.5 09/27/2014   PLT 235 09/27/2014   GLUCOSE 175* 09/27/2014   CHOL 220* 06/23/2014   TRIG 41 06/23/2014   HDL 72 06/23/2014   LDLCALC  140* 06/23/2014   ALT 19 09/27/2014   AST 19 09/27/2014   NA 139 09/27/2014   K 4.2 09/27/2014   CL 104 09/27/2014   CREATININE 1.43* 09/27/2014   BUN 23* 09/27/2014   CO2 26 09/27/2014   TSH 0.548 06/23/2014   INR 1.12 09/27/2014   HGBA1C 9.0* 06/23/2014    Ct Head Wo Contrast  06/23/2014   CLINICAL DATA:  Intracranial hemorrhage.  Hypertension  EXAM: CT HEAD WITHOUT CONTRAST  TECHNIQUE: Contiguous axial images were obtained from the  base of the skull through the vertex without intravenous contrast.  COMPARISON:  CT head [facility] 06/22/2014  FINDINGS: Right thalamic hemorrhage measures 13 mm with surrounding edema and mild local mass-effect. No intraventricular or subarachnoid hemorrhage.  Ventricle size is normal.  No shift of the midline structures.  Hypodensity throughout the cerebral white matter bilaterally. This pattern is most typical of chronic microvascular ischemia.  Mild atrophy for age.  Negative for mass lesion.  Negative calvarium  IMPRESSION: 13 mm hemorrhage right thalamus with mild local mass-effect. No ventricular extension  Atrophy and chronic microvascular ischemia.   Electronically Signed   By: Marlan Palau M.D.   On: 06/23/2014 11:29   Ct Head (brain) Wo Contrast  06/22/2014   CLINICAL DATA:  Left-sided facial droop. Extremity weakness. History of hypertension. Code stroke.  EXAM: CT HEAD WITHOUT CONTRAST  TECHNIQUE: Contiguous axial images were obtained from the base of the skull through the vertex without intravenous contrast.  COMPARISON:  None.  FINDINGS: There is an approximately 1.2 x 1.5 cm hemorrhage located within the right thalamus (image 13, series 2). No definite intraventricular extension. No significant adjacent mass effect. No midline shift.  Age advanced periventricular and bilateral basal ganglial hypodensities compatible with microvascular ischemic disease. Age advanced atrophy with mild diffuse sulcal prominence. Normal size and configuration of the ventricles and basilar cisterns. Limited visualization the paranasal sinuses and mastoid air cells is normal. No air-fluid levels. Regional soft tissues appear normal. No radiopaque foreign body. A prominent dystrophic calcification is noted about the inner table of the right frontal calvarium. No displaced calvarial fracture.  IMPRESSION: 1. Acute approximately 1.5 cm right thalamic intraparenchymal hemorrhage without intraventricular  extension or significant mass effect. While potentially hypertensive in etiology, given extensive age advanced microvascular ischemic disease, hemorrhagic conversion of a microvascular infarct could have a similar appearance. As such, further evaluation with brain MRI is recommended.  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 Critical Value/emergent results were called by telephone at the time of interpretation on 06/22/2014 at 10:12 pm to Dr. Fredderick Phenix, who verbally acknowledged these results.   Electronically Signed   By: Simonne Come M.D.   On: 06/22/2014 22:21     Assessment & Plan:  Plan I am having Ms. Agosto maintain her ONETOUCH DELICA LANCETS FINE, glucose blood, potassium chloride SA, atorvastatin, amLODipine, saxagliptin HCl, Metoprolol Tartrate, and lisinopril.  Meds ordered this encounter  Medications  . saxagliptin HCl ([name]) 5 MG TABS tablet    Sig: Take 1 tablet (5 mg total) by mouth daily.    Dispense:  30 tablet    Refill:  2  . Metoprolol Tartrate 37.5 MG TABS    Sig: Take 37.5 mg by mouth 2 (two) times daily.    Dispense:  60 tablet    Refill:  2  . lisinopril (PRINIVIL,ZESTRIL) 20 MG tablet    Sig: Take 1 tablet (20 mg total) by mouth daily.    Dispense:  30 tablet    Refill:  2    Problem List Items Addressed This Visit    Thalamic hemorrhage with stroke    With worsening of slurred speech pt was sent down to ER for evaluation D/w ER physician      Hypertension   Relevant Medications   Metoprolol Tartrate 37.5 MG TABS   lisinopril (PRINIVIL,ZESTRIL) 20 MG tablet   Hyperlipidemia LDL goal <70   Relevant Medications   Metoprolol Tartrate 37.5 MG TABS   lisinopril (PRINIVIL,ZESTRIL) 20 MG tablet    Other Visit Diagnoses    DM (diabetes mellitus) type II uncontrolled, periph vascular disorder    -  Primary    Relevant Medications    saxagliptin HCl ([name]) 5 MG TABS tablet    Metoprolol Tartrate 37.5 MG TABS    lisinopril  (PRINIVIL,ZESTRIL) 20 MG tablet       Follow-up: Return in about 6 months (around 03/28/2015), or if symptoms worsen or fail to improve, for hypertension, hyperlipidemia, diabetes II.  Loreen Freud, DO

## 2014-09-27 NOTE — Patient Instructions (Signed)

## 2014-09-27 NOTE — Discharge Instructions (Signed)
Please read and follow all provided instructions.  Your diagnoses today include:  1. Slurred speech     Tests performed today include:  CT of your head - normal today  Blood counts and electrolytes  Vital signs. See below for your results today.   Medications:   None  Take any prescribed medications only as directed.  Additional information:  Follow any educational materials contained in this packet.  Follow-up instructions: Please follow-up with your primary care provider and neurologist in the next 7 days for further evaluation of your symptoms.   You will be contacted regarding having an outpatient MRI in the next several days.   Return instructions:   Please return to the Emergency Department if you experience worsening symptoms.  RETURN IMMEDIATELY IF you:  Develop a sudden, severe headache  Develop confusion or become poorly responsive or faint  Develop a fever above 100.40F or problem breathing  Have a new change in speech, vision, swallowing, or understanding  Develop new weakness, numbness, tingling, incoordination in your arms or legs  Have a seizure  Please return if you have any other emergent concerns.  Additional Information:  Your vital signs today were: BP 160/89 mmHg   Pulse 57   Temp(Src) 98 F (36.7 C) (Oral)   Resp 15   Ht  (1.778 m)   Wt 190 lb (86.183 kg)   BMI 27.26 kg/m2   SpO2 99% If your blood pressure (BP) was elevated above 135/85 this visit, please have this repeated by your doctor within one month. --------------

## 2014-09-27 NOTE — Telephone Encounter (Signed)
Katrina called again, ph# [phone], please call before 5pm.

## 2014-09-27 NOTE — Telephone Encounter (Signed)
Patient was seen in the ED.     KP

## 2014-09-27 NOTE — Telephone Encounter (Signed)
Stephanie Lloyd/Dr Lowne [phone] called back, would like to speak with you when you are available. She will be in the office until 7pm.

## 2014-09-27 NOTE — ED Provider Notes (Signed)
CSN: [id number]     Arrival date & time 09/27/14  1125 History   First MD Initiated Contact with Patient 09/27/14 1204     Chief Complaint  Patient presents with  . Aphasia     (Consider location/radiation/quality/duration/timing/severity/associated sxs/prior Treatment) HPI Comments: Patient with hemorrhagic stroke in 05/2014, history of diabetes and hypertension -- presents with complaint of 5 days of worsening slurred speech as well as increased stumbling. Patient states that symptoms started after her neurologist appointment last week. She was seen by her PCP today who sent her to the emergency department to rule out acute stroke. Patient otherwise denies any acute complaints. She denies having any weakness in her arms or legs. She denies having any word finding difficulties. No headache. She was using a walker to ambulate immediately after the stroke but has since not needed a walker. No headache, nausea or vomiting. She states that her vision is blurry but this is unchanged. No treatments prior to arrival. States that her blood pressures have been better controlled since she is now on medication for this.  The history is provided by the patient and medical records.    Past Medical History  Diagnosis Date  . Immune deficiency disorder   . Diabetes   . Diabetes mellitus without complication   . Hypertension    History reviewed. No pertinent past surgical history. No family history on file. Social History  Substance Use Topics  . Smoking status: Never Smoker   . Smokeless tobacco: Never Used  . Alcohol Use: No     Comment: occ   OB History    Gravida Para Term Preterm AB TAB SAB Ectopic Multiple Living   0 0 0 0 0 0 0 0       Review of Systems  Constitutional: Negative for fever.  HENT: Negative for congestion, dental problem, rhinorrhea and sinus pressure.   Eyes: Negative for photophobia, discharge, redness and visual disturbance.  Respiratory: Negative for shortness of  breath.   Cardiovascular: Negative for chest pain.  Gastrointestinal: Negative for nausea and vomiting.  Musculoskeletal: Positive for gait problem (stumbing but has not fallen). Negative for neck pain and neck stiffness.  Skin: Negative for rash.  Neurological: Positive for speech difficulty. Negative for syncope, facial asymmetry, weakness, light-headedness, numbness and headaches.  Psychiatric/Behavioral: Negative for confusion.      Allergies  Metformin and related  Home Medications   Prior to Admission medications   Medication Sig Start Date End Date Taking? Authorizing Provider  amLODipine (NORVASC) 10 MG tablet Take 1 tablet (10 mg total) by mouth daily. 07/26/14   Lelon Perla, DO  atorvastatin (LIPITOR) 40 MG tablet Take 1 tablet (40 mg total) by mouth daily. 07/26/14   Lelon Perla, DO  glucose blood (ONETOUCH VERIO) test strip Check blood sugar twice daily. Dx: E11.9 06/30/14   Lelon Perla, DO  lisinopril (PRINIVIL,ZESTRIL) 20 MG tablet Take 1 tablet (20 mg total) by mouth daily. 09/27/14   Lelon Perla, DO  Metoprolol Tartrate 37.5 MG TABS Take 37.5 mg by mouth 2 (two) times daily. 09/27/14   Lelon Perla, DO  ONETOUCH DELICA LANCETS FINE MISC Check blood sugar twice a day. Dx:E11.9 06/30/14   Lelon Perla, DO  potassium chloride SA (K-DUR,KLOR-CON) 20 MEQ tablet Take 1 tablet (20 mEq total) by mouth 2 (two) times daily. 07/26/14   Lelon Perla, DO  saxagliptin HCl ([name]) 5 MG TABS tablet Take 1 tablet (5 mg total) by  mouth daily. 09/27/14   [name] Lowne, DO   BP 146/92 mmHg  Pulse 58  Temp(Src) 97.8 F (36.6 C) (Oral)  Resp 18  Ht 5\' 10"  (1.778 m)  Wt 190 lb (86.183 kg)  BMI 27.26 kg/m2  SpO2 97%   Physical Exam  Constitutional: She is oriented to person, place, and time. She appears well-developed and well-nourished.  HENT:  Head: Normocephalic and atraumatic.  Right Ear: Tympanic membrane, external ear and ear canal normal.  Left Ear: Tympanic  membrane, external ear and ear canal normal.  Nose: Nose normal.  Mouth/Throat: Uvula is midline, oropharynx is clear and moist and mucous membranes are normal.  Eyes: Conjunctivae, EOM and lids are normal. Pupils are equal, round, and reactive to light. Right eye exhibits no nystagmus. Left eye exhibits no nystagmus.  Neck: Normal range of motion. Neck supple.  Cardiovascular: Normal rate and regular rhythm.   Pulmonary/Chest: Effort normal and breath sounds normal.  Abdominal: Soft. There is no tenderness.  Musculoskeletal:       Cervical back: She exhibits normal range of motion, no tenderness and no bony tenderness.  Neurological: She is alert and oriented to person, place, and time. She has normal strength and normal reflexes. A cranial nerve deficit is present. No sensory deficit. She displays a negative Romberg sign. Coordination normal. GCS eye subscore is 4. GCS verbal subscore is 5. GCS motor subscore is 6.  Speech is slurred and hesitant at times. No facial droop.   Skin: Skin is warm and dry.  Psychiatric: She has a normal mood and affect.  Nursing note and vitals reviewed.   ED Course  Procedures (including critical care time) Labs Review Labs Reviewed  COMPREHENSIVE METABOLIC PANEL - Abnormal; Notable for the following:    Glucose, Bld 175 (*)    BUN 23 (*)    Creatinine, Ser 1.43 (*)    GFR calc non Af Amer 43 (*)    GFR calc Af Amer 50 (*)    All other components within normal limits  PROTIME-INR  APTT  CBC  DIFFERENTIAL  TROPONIN I  URINALYSIS, ROUTINE W REFLEX MICROSCOPIC (NOT AT [facility])    Imaging Review Ct Head Wo Contrast  09/27/2014   CLINICAL DATA:  Slurred speech for 5 days. History of hemorrhagic CVA. Initial encounter.  EXAM: CT HEAD WITHOUT CONTRAST  TECHNIQUE: Contiguous axial images were obtained from the base of the skull through the vertex without intravenous contrast.  COMPARISON:  06/23/2014 head CT.  FINDINGS: The right thalamic hematoma has  resolved without significant sequela. There are chronic small vessel ischemic changes within the periventricular white matter and basal ganglia, asymmetric to the right. There is no evidence of acute intracranial hemorrhage, mass lesion, brain edema or extra-axial fluid collection. The ventricles and subarachnoid spaces are prominent but stable. There is no CT evidence of acute cortical infarction.  The visualized paranasal sinuses, mastoid air cells and middle ears are clear. The calvarium is intact.  IMPRESSION: No acute findings or explanation for the patient's symptoms. Interval resolution of previously demonstrated right thalamic hemorrhage.   Electronically Signed   By: Carey Bullocks M.D.   On: 09/27/2014 12:30   I have personally reviewed and evaluated these images and lab results as part of my medical decision-making.   EKG Interpretation None       12:16 PM Patient seen and examined. Work-up initiated. Medications ordered.   Vital signs reviewed and are as follows: BP 146/92 mmHg  Pulse 58  Temp(Src) 97.8 F (36.6 C) (Oral)  Resp 18  Ht 5\' 10"  (1.778 m)  Wt 190 lb (86.183 kg)  BMI 27.26 kg/m2  SpO2 97%  1:22 PM Patient discussed with Dr. Judd Lien who will see.   Spoke with Dr. Leroy Kennedy who does not feel patient needs transfer for MRI at this time, given that we would expect to see some findings on CT now 5 days after symptoms worsened.   1:36 PM Patient has ambulated well. Had some trouble with swallow screen but patient states that her swallowing difficulties are not new.   2:40 PM Patient seen by Dr. Judd Lien. Will discharge to home. I have placed an order for an outpatient MRI. Patient has an outpatient MRI scheduled for 3 weeks, hopefully she can get this done sooner.  Patient counseled to return if they have new weakness in their arms or legs, slurred speech, trouble walking or talking, confusion, trouble with their balance, or if they have any other concerns. Patient verbalizes  understanding and agrees with plan.  Exam stable while in ED.   MDM   Final diagnoses:  Slurred speech   Patient with previous stroke with questionable worsening of her previous symptoms. CT head is normal. Labs otherwise reassuring. Treatment plan as above. Discussed with neurology at [facility]. Do not feel that admission is indicated at this time without positive findings on CT and with her mild symptoms. Outpatient MRI arranged. Patient has good PCP follow-up. Discussed return precautions as above and patient agrees to return immediately with abrupt worsening of symptoms.   Renne Crigler, PA-C 09/27/14 1442  Geoffery Lyons, MD 09/27/14 [phone]

## 2014-09-28 ENCOUNTER — Telehealth: Payer: Self-pay | Admitting: *Deleted

## 2014-09-28 NOTE — Telephone Encounter (Signed)
PA for metoprolol initiated. Awaiting determination. JG//CMA

## 2014-09-29 NOTE — Telephone Encounter (Signed)
Talk to  Stephanie Lloyd/Dr Lowne [phone], at pts PCP office. Selena Batten is the CMA that saw pt on 09-27-14. Selena Batten stated pt was there for a follow up for DM, and HTH. Selena Batten stated husband stated she was getting weaker, and having slurred speech. They stated the emergency room is in the same building where they work. Pt was work up for stroke and everything was fine.

## 2014-09-30 ENCOUNTER — Ambulatory Visit: Payer: 59 | Admitting: Family Medicine

## 2014-10-02 NOTE — Assessment & Plan Note (Signed)
With worsening of slurred speech pt was sent down to ER for evaluation D/w ER physician

## 2014-10-07 ENCOUNTER — Telehealth: Payer: Self-pay | Admitting: Physical Therapy

## 2014-10-07 ENCOUNTER — Encounter: Payer: Self-pay | Admitting: Physical Therapy

## 2014-10-07 ENCOUNTER — Ambulatory Visit: Payer: 59 | Attending: Neurology | Admitting: Physical Therapy

## 2014-10-07 DIAGNOSIS — R42 Dizziness and giddiness: Secondary | ICD-10-CM | POA: Insufficient documentation

## 2014-10-07 DIAGNOSIS — R2681 Unsteadiness on feet: Secondary | ICD-10-CM | POA: Diagnosis present

## 2014-10-07 DIAGNOSIS — I610 Nontraumatic intracerebral hemorrhage in hemisphere, subcortical: Secondary | ICD-10-CM

## 2014-10-07 DIAGNOSIS — R531 Weakness: Secondary | ICD-10-CM | POA: Diagnosis present

## 2014-10-07 DIAGNOSIS — R269 Unspecified abnormalities of gait and mobility: Secondary | ICD-10-CM | POA: Diagnosis not present

## 2014-10-07 NOTE — Telephone Encounter (Signed)
Dr. Roda Shutters,  PT evaluation was completed today. Due to cognitive impairments, word-finding difficulties, swallowing difficulties (per report of pt/husband), and trouble with handwriting, pt may benefit from outpatient SLP and OT evaluations.  If you agree, please submit an order for outpatient SLP and OT.  Thanks so much,  Jorje Guild, PT, DPT [facility] [address] [place], [place], [address] Phone: [phone]   Fax:  [phone] 10/07/2014, 12:43 PM

## 2014-10-08 ENCOUNTER — Encounter: Payer: Self-pay | Admitting: Physical Therapy

## 2014-10-08 NOTE — Therapy (Signed)
[facility] [address] [place], [place], [address] Phone: [phone]   Fax:  [phone]  Physical Therapy Evaluation  Patient Details  Name: Stephanie Lloyd MRN: [id number] Date of Birth: October 24, 1970 Referring Provider:  Marvel Plan, MD  Encounter Date: 10/07/2014      PT End of Session - 10/07/14 1245    Visit Number 1   Number of Visits 17  eval + 16 visits   Date for PT Re-Evaluation 12/06/14   Authorization Type UHC; $20 co-pay and 23 visit limit for PT   PT Start Time 1149   PT Stop Time 1233   PT Time Calculation (min) 44 min   Activity Tolerance Patient tolerated treatment well   Behavior During Therapy [facility] for tasks assessed/performed      Past Medical History  Diagnosis Date  . Immune deficiency disorder   . Diabetes   . Diabetes mellitus without complication   . Hypertension     History reviewed. No pertinent past surgical history.  There were no vitals filed for this visit.  Visit Diagnosis:  Abnormality of gait - Plan: PT plan of care cert/re-cert  Unsteadiness - Plan: PT plan of care cert/re-cert  Dizziness and giddiness - Plan: PT plan of care cert/re-cert  Weakness generalized - Plan: PT plan of care cert/re-cert      Subjective Assessment - 10/07/14 1152    Subjective "I need help with my speech, my hand writing, and walking. I cough and get choked a lot." Pt initially hospitalized on 06/22/14 with L-sided weakness, slurred speech, increased word-finding difficulties. Discharged home 2 days later, had seen Dr. Roda Shutters for neurology follow up in August, and had worsening of symptoms the following day. Seen in [facility] ED; had CT scan; not admitted due to no significant findings on CT scan. Pt reports difficulty walking up and down 13 stairs to get into home. Pt states, "I tip over a lot" and "need to grab onto things a lot."  Pt also states she "gets winded easily".   Patient is accompained  by: Family member  husband, Harvie Heck   Pertinent History Pt goes by [employer]". PMH: R thalamic hemorrhage (06/22/14) mild local mass-effect; HTN, DM, HLD   Patient Stated Goals "Want to be able to communicate, swallow better, hand writing better, better balance."   Currently in Pain? No/denies                               PT Education - 10/08/14 1348    Education provided Yes   Education Details Pt eval findings, goals, and POC. Recommendation for OT and SLP evaluations.   Person(s) Educated Patient;Spouse   Methods Explanation   Comprehension Verbalized understanding          PT Short Term Goals - 10/07/14 1305    PT SHORT TERM GOAL #1   Title Pt will perform initial HEP with mod I using paper handout to indicate safe compliance, maximize functional gains. Target date: 11/04/14.   PT SHORT TERM GOAL #2   Title Pt will increase FGA score from 16/30 to 19/30 to indicte improved dynamic gait stability. Target date: 11/04/14.   PT SHORT TERM GOAL #3   Title Pt will independently ambulate >500' over level, indoor surfaces with effective obstacle negotiation to indicate increased safety with household ambulation. Target date: 11/04/14.   PT SHORT TERM GOAL #4   Title Pt will independently negotiate  13 stairs with single L rail to indicate pt ability to safely access second floor of home. Target date: 11/04/14.   PT SHORT TERM GOAL #5   Title Perform 6 Minute Walk Test to assess functional endurance, address pt concern with decline in activity tolerance. Target date: 11/04/14.   Additional Short Term Goals   Additional Short Term Goals Yes   PT SHORT TERM GOAL #6   Title Pt will increase self-selected gait speed from 2.78 ft/sec to > 3.0 ft/sec to indicate imcreased efficiency of ambulation. Target date: 11/04/14.           PT Long Term Goals - 10/07/14 1308    PT LONG TERM GOAL #1   Title Pt will improve FGA score from 16/30 to 23/30 to indicate decreased fall  risk. Target date: 12/02/14   PT LONG TERM GOAL #2   Title Pt will ambulate 500' over level and unlevel, paved surfaces with mod I using LRAD to indicate increased safety with community mobility. Target date: 12/02/14   PT LONG TERM GOAL #3   Title Pt will negotiate standard ramp and curb step with mod I using LRAD to indicate increased safety traversing community obstacles. Target date: 12/02/14.   PT LONG TERM GOAL #4   Title Pt will increase self-selected gait speed from 2.78 ft/sec to 3.24 ft/sec to indicate substantial change in efficiency of ambulation. Target date: 12/02/14   PT LONG TERM GOAL #5   Title Pt will improve 6 Minute Walk distance by 120' from baseline to indicate improved functional endurance. Target date: 12/02/14   Additional Long Term Goals   Additional Long Term Goals Yes   PT LONG TERM GOAL #6   Title Pt will verbalize understanding of CVA warning signs, pertinent risk factors to decrease risk of CVA. Target date: 12/02/14   PT LONG TERM GOAL #7   Title Pt will verbalize understanding of fall prevention strategies to decrease risk of falling in home environment. Target date: 12/02/14.   PT LONG TERM GOAL #8   Title Pt will improve Neuro QoL LE score from 44.4% to > 54.4% to indicate significant improvement in quality of life. Target date: 12/02/14.               Plan - 10/07/14 1246    Clinical Impression Statement Pt is a 44 y/o F referred to outpatient PT to address functional impairments associated with nontraumatic subcortical cerebral hemorrhage on 06/22/14. Pt was hospitalized for this from 5/25 - 06/24/14 before being discharged home. Pt [facility] on [facility]/30 due to worsening slurred speech, increased gait impairments; however, pt not admitted secondary to negative CT scan. PT evaluation reveals the following impairments: FGA score indicative of fall risk and decreased gait stability in the presence of external demands; gait impairments; proximal weakness;  visual impairments/disequilibrium with head movements. Pt will benefit from skilled outpatient PT 2x/week for up to 8 weeks to address said impairments.   Pt will benefit from skilled therapeutic intervention in order to improve on the following deficits Abnormal gait;Decreased activity tolerance;Decreased balance;Decreased cognition;Decreased endurance;Decreased coordination;Decreased strength;Decreased safety awareness;Dizziness;Decreased mobility   Rehab Potential Good   Clinical Impairments Affecting Rehab Potential cognitive impairments, financial limitations (high insurance co-pay)   PT Frequency 2x / week  Husband requesting to schedule 1x/week initially due to financial limitations   PT Duration 8 weeks   PT Treatment/Interventions ADLs/Self Care Home Management;Functional mobility training;Stair training;Patient/family education;DME Instruction;Therapeutic activities;Therapeutic exercise;Balance training;Neuromuscular re-education;Manual techniques;Vestibular;Gait training  PT Next Visit Plan Perform 6 Minute Walk Test. Initiate HEP. Look more closely at vision/vestibular.   Recommended Other Services Sent telephone encounter to Dr. Roda Shutters requesting OT, SLP orders.   Consulted and Agree with Plan of Care Patient;Family member/caregiver   Family Member Consulted husband, Harvie Heck         Problem List Patient Active Problem List   Diagnosis Date Noted  . Accelerated hypertension 09/21/2014  . Type 2 diabetes mellitus with other circulatory complications 09/21/2014  . Hyperlipidemia 09/21/2014  . Thalamic hemorrhage with stroke 07/29/2014  . Hyperlipidemia LDL goal <70 07/29/2014  . Hemorrhagic stroke 06/22/2014  . ICH (intracerebral hemorrhage) 06/22/2014  . Hypertension 05/13/2012  . Diabetes mellitus 05/13/2012    Jorje Guild, PT, DPT [facility] [address] [place], [place], [address] Phone: [phone]   Fax:   [phone] 10/08/2014, 2:19 PM

## 2014-10-08 NOTE — Addendum Note (Signed)
Addended by: Marvel Plan on: 10/08/2014 08:52 AM   Modules accepted: Orders

## 2014-10-08 NOTE — Telephone Encounter (Signed)
Done. See below. Thanks for your help.  Marvel Plan, MD PhD Stroke Neurology 10/08/2014 8:51 AM  Orders Placed This Encounter  Procedures  . Ambulatory referral to Occupational Therapy    Referral Priority:  Routine    Referral Type:  Occupational Therapy    Referral Reason:  Specialty Services Required    Requested Specialty:  Occupational Therapy    Number of Visits Requested:  1  . Ambulatory referral to Speech Therapy    Referral Priority:  Routine    Referral Type:  Speech Therapy    Referral Reason:  Specialty Services Required    Requested Specialty:  Speech Pathology    Number of Visits Requested:  1

## 2014-10-12 ENCOUNTER — Ambulatory Visit: Payer: 59 | Admitting: Physical Therapy

## 2014-10-12 DIAGNOSIS — R269 Unspecified abnormalities of gait and mobility: Secondary | ICD-10-CM

## 2014-10-12 DIAGNOSIS — R2681 Unsteadiness on feet: Secondary | ICD-10-CM

## 2014-10-12 DIAGNOSIS — R531 Weakness: Secondary | ICD-10-CM

## 2014-10-12 DIAGNOSIS — R42 Dizziness and giddiness: Secondary | ICD-10-CM

## 2014-10-12 NOTE — Therapy (Signed)
[facility] [address] [place], [place], [address] Phone: [phone]   Fax:  [phone]  Physical Therapy Treatment  Patient Details  Name: Stephanie Lloyd MRN: [id number] Date of Birth: 1970-09-12 Referring Provider:  Lelon Perla, DO  Encounter Date: 10/12/2014      PT End of Session - 10/12/14 1955    Visit Number 2   Number of Visits 17   Date for PT Re-Evaluation 12/06/14   Authorization Type UHC   PT Start Time 1018   PT Stop Time 1102   PT Time Calculation (min) 44 min   Activity Tolerance Patient tolerated treatment well   Behavior During Therapy [facility] for tasks assessed/performed      Past Medical History  Diagnosis Date  . Immune deficiency disorder   . Diabetes   . Diabetes mellitus without complication   . Hypertension     No past surgical history on file.  There were no vitals filed for this visit.  Visit Diagnosis:  Abnormality of gait  Unsteadiness  Dizziness and giddiness  Weakness generalized      Subjective Assessment - 10/12/14 1023    Subjective "My husband says we're going to have to get a new place that doesn't have any stairs because I have such a hard time with them in our condo." Pt denies falls; reports no pain.   Pertinent History Pt goes by "[name]". PMH: R thalamic hemorrhage (06/22/14) mild local mass-effect; HTN, DM, HLD   Patient Stated Goals "Want to be able to communicate, swallow better, hand writing better, better balance."   Currently in Pain? No/denies            [facility] PT Assessment - 10/12/14 0001    Ambulation/Gait   Ambulation Distance (Feet) 1063 Feet    6 minute walk test results    Aerobic Endurance Distance Walked 1063   Endurance additional comments no AD; increasingly more L lateral trunk lean with invcreased fatigue/distance ambulated            Vestibular Assessment - 10/12/14 0001    Vestibular Assessment   General  Observation decreased gait stability with head turns on DGI   Symptom Behavior   Type of Dizziness Blurred vision  Pt with much dificulty explaining symptoms   Duration of Dizziness "depends from day to day"   Occulomotor Exam   Occulomotor Alignment Normal   Gaze-induced Left beating nystagmus with L gaze   Smooth Pursuits Saccades  with horizontal tracking to R, superior/inferior tracking   Saccades Poor trajectory  undershooting to R, upward   Vestibulo-Occular Reflex   VOR 1 Head Only (x 1 viewing) Horizontal: pt unable due to pt expressing "it's just hard to do both at the same time." Vertical: pt consistently closes eyes or loses target when moving head downward    VOR Cancellation Corrective saccades                 [facility] Adult PT Treatment/Exercise - 10/12/14 0001    Transfers   Transfers Sit to Stand;Stand to Sit   Sit to Stand 6: Modified independent (Device/Increase time)   Stand to Sit 6: Modified independent (Device/Increase time)   Ambulation/Gait   Ambulation/Gait Yes   Ambulation/Gait Assistance 5: Supervision   Assistive device None   Gait Pattern Step-through pattern;Decreased stride length;Decreased dorsiflexion - left;Decreased hip/knee flexion - right;Decreased hip/knee flexion - left;Decreased trunk rotation  posterior trunk lean throughout gait cycle   Ambulation Surface  Level;Indoor   Stairs Yes   Stairs Assistance 5: Supervision   Stairs Assistance Details (indicate cue type and reason) Provided (S)/cueing for technique for lateral negotiation of stairs with BUE suport at L rail. Upon returning to stairs later in session, pt required cueing to recall technique. Therefore, provided paper handout to promote carryover. See Pt Instructions.   Stair Management Technique One rail Left;Step to pattern;Sideways   Number of Stairs 16  4 stairs x3 trials then x1 trial later in session   Height of Stairs 6         Vestibular Treatment/Exercise - 10/12/14  0001    Vestibular Treatment/Exercise   Vestibular Treatment Provided Gaze   Gaze Exercises X1 Viewing Horizontal;X1 Viewing Vertical   X1 Viewing Horizontal   Foot Position Seated   Comments 30 seconds x2 trials; 60 seconds x1 trial   X1 Viewing Vertical   Foot Position Seated   Comments 2 x 30-second trials with cueing to slow head movement to maintain eyes on target                 PT Short Term Goals - 10/12/14 1036    PT SHORT TERM GOAL #1   Title Pt will perform initial HEP with mod I using paper handout to indicate safe compliance, maximize functional gains. Target date: 11/04/14.   PT SHORT TERM GOAL #2   Title Pt will increase FGA score from 16/30 to 19/30 to indicte improved dynamic gait stability. Target date: 11/04/14.   PT SHORT TERM GOAL #3   Title Pt will independently ambulate >500' over level, indoor surfaces with effective obstacle negotiation to indicate increased safety with household ambulation. Target date: 11/04/14.   PT SHORT TERM GOAL #4   Title Pt will independently negotiate 13 stairs with single L rail to indicate pt ability to safely access second floor of home. Target date: 11/04/14.   PT SHORT TERM GOAL #5   Title Perform 6 Minute Walk Test to assess functional endurance, address pt concern with decline in activity tolerance. Target date: 11/04/14.   Baseline 9/14: Baseline distance 1,063'   Status Achieved   PT SHORT TERM GOAL #6   Title Pt will increase self-selected gait speed from 2.78 ft/sec to > 3.0 ft/sec to indicate imcreased efficiency of ambulation. Target date: 11/04/14.           PT Long Term Goals - 10/07/14 1308    PT LONG TERM GOAL #1   Title Pt will improve FGA score from 16/30 to 23/30 to indicate decreased fall risk. Target date: 12/02/14   PT LONG TERM GOAL #2   Title Pt will ambulate 500' over level and unlevel, paved surfaces with mod I using LRAD to indicate increased safety with community mobility. Target date: 12/02/14    PT LONG TERM GOAL #3   Title Pt will negotiate standard ramp and curb step with mod I using LRAD to indicate increased safety traversing community obstacles. Target date: 12/02/14.   PT LONG TERM GOAL #4   Title Pt will increase self-selected gait speed from 2.78 ft/sec to 3.24 ft/sec to indicate substantial change in efficiency of ambulation. Target date: 12/02/14   PT LONG TERM GOAL #5   Title Pt will improve 6 Minute Walk distance by 120' from baseline to indicate improved functional endurance. Target date: 12/02/14   Additional Long Term Goals   Additional Long Term Goals Yes   PT LONG TERM GOAL #6   Title Pt will  verbalize understanding of CVA warning signs, pertinent risk factors to decrease risk of CVA. Target date: 12/02/14   PT LONG TERM GOAL #7   Title Pt will verbalize understanding of fall prevention strategies to decrease risk of falling in home environment. Target date: 12/02/14.   PT LONG TERM GOAL #8   Title Pt will improve Neuro QoL LE score from 44.4% to > 54.4% to indicate significant improvement in quality of life. Target date: 12/02/14.               Plan - 10/12/14 2006    Clinical Impression Statement Skilled session focused on assessing/addressing the following: functional endurance (via ), vestibular impairments and effect on functional mobility; and safety with stair negotiation. Brief vestibular assessment revealed L gaze-evoked nystagmus, impaired occulomotor coordination, impaired VOR cancellation, and impaired slow VOR. Initiated HEP with gaze stabilization and directions for safe stair negotiation, as pt was unable to recall  technique taught within this session. Continue per POC.   Pt will benefit from skilled therapeutic intervention in order to improve on the following deficits Abnormal gait;Decreased activity tolerance;Decreased balance;Decreased cognition;Decreased endurance;Decreased coordination;Decreased strength;Decreased safety  awareness;Dizziness;Decreased mobility   Rehab Potential Good   Clinical Impairments Affecting Rehab Potential cognitive impairments, financial limitations (high insurance co-pay)   PT Frequency 2x / week  husband requesting to schedule 1x/week due to financial limitations   PT Duration 8 weeks   PT Treatment/Interventions ADLs/Self Care Home Management;Functional mobility training;Stair training;Patient/family education;DME Instruction;Therapeutic activities;Therapeutic exercise;Balance training;Neuromuscular re-education;Manual techniques;Vestibular;Gait training   PT Next Visit Plan Perform 6 Minute Walk Test. Initiate HEP. Look more closely at vision/vestibular.   Consulted and Agree with Plan of Care Patient        Problem List Patient Active Problem List   Diagnosis Date Noted  . Accelerated hypertension 09/21/2014  . Type 2 diabetes mellitus with other circulatory complications 09/21/2014  . Hyperlipidemia 09/21/2014  . Thalamic hemorrhage with stroke 07/29/2014  . Hyperlipidemia LDL goal <70 07/29/2014  . Hemorrhagic stroke 06/22/2014  . ICH (intracerebral hemorrhage) 06/22/2014  . Hypertension 05/13/2012  . Diabetes mellitus 05/13/2012    Jorje Guild, PT, DPT [facility] [address] [place], [place], [address] Phone: [phone]   Fax:  [phone] 10/12/2014, 8:13 PM

## 2014-10-12 NOTE — Patient Instructions (Addendum)
Gaze Stabilization: Sitting   Remove your glasses for this exercise only.  While seated, hold "A" arm's length away (or tape the "A" on the wall about 3 feet in front of you). Make sure the "A" is slightly below eye-level.  Turn your head from side to side (as though you're shaking your head "no") while keeping the "A" in focus. If you lose focus or the "A" becomes blurry, slow your head movement down until it's back in focus. Continue for 60 seconds. Repeat twice per day.  Then perform the same exercise, except look at the "A" while moving head up and down (as though you're nodding your head "yes") Do this for 30 seconds, twice per day.      When going up the stairs, place both hands on the left rail and face the rail (you're body will be positioned sideways). Step one foot up at a time, leading with you're right foot first.  When doing down the stairs, position your hands/body the same way as going up and step down one foot at a time, leading with your left first.

## 2014-10-13 ENCOUNTER — Encounter: Payer: Self-pay | Admitting: Occupational Therapy

## 2014-10-13 ENCOUNTER — Ambulatory Visit: Payer: 59

## 2014-10-14 ENCOUNTER — Ambulatory Visit: Payer: 59 | Admitting: Neurology

## 2014-10-18 NOTE — Telephone Encounter (Signed)
Metoprolol is a plan exclusion for for this member, so there is no coverage criteria to review and apply. No alternatives available since this is a plan exclusion. PA and appeal both denied. Please advise. JG//CMA

## 2014-10-18 NOTE — Telephone Encounter (Signed)
I'll need a copy of her formulary

## 2014-10-19 ENCOUNTER — Ambulatory Visit (INDEPENDENT_AMBULATORY_CARE_PROVIDER_SITE_OTHER): Payer: 59

## 2014-10-19 ENCOUNTER — Ambulatory Visit: Payer: 59 | Admitting: Physical Therapy

## 2014-10-19 DIAGNOSIS — R2681 Unsteadiness on feet: Secondary | ICD-10-CM

## 2014-10-19 DIAGNOSIS — R269 Unspecified abnormalities of gait and mobility: Secondary | ICD-10-CM | POA: Diagnosis not present

## 2014-10-19 DIAGNOSIS — I610 Nontraumatic intracerebral hemorrhage in hemisphere, subcortical: Secondary | ICD-10-CM

## 2014-10-19 DIAGNOSIS — R42 Dizziness and giddiness: Secondary | ICD-10-CM

## 2014-10-19 NOTE — Therapy (Signed)
[facility] [address] [place], [place], [address] Phone: [phone]   Fax:  [phone]  Physical Therapy Treatment  Patient Details  Name: Stephanie Lloyd MRN: [id number] Date of Birth: [birth year] Referring Provider:  Lelon Perla, DO  Encounter Date: 10/19/2014      PT End of Session - 10/19/14 1127    Visit Number 3   Number of Visits 17   Date for PT Re-Evaluation 12/06/14   Authorization Type [employer]   PT Start Time 1017   PT Stop Time 1102   PT Time Calculation (min) 45 min   Activity Tolerance Patient tolerated treatment well   Behavior During Therapy [facility] for tasks assessed/performed      Past Medical History  Diagnosis Date  . Immune deficiency disorder   . Diabetes   . Diabetes mellitus without complication   . Hypertension     No past surgical history on file.  There were no vitals filed for this visit.  Visit Diagnosis:  Abnormality of gait  Unsteadiness  Dizziness and giddiness                       [facility] Adult PT Treatment/Exercise - 10/19/14 0001    Transfers   Transfers Sit to Stand;Stand to Sit   Sit to Stand 7: Independent   Stand to Sit 7: Independent   Ambulation/Gait   Ambulation/Gait Yes   Ambulation/Gait Assistance 6: Modified independent (Device/Increase time);5: Supervision   Ambulation/Gait Assistance Details Gait x500' over unlevel, outdoor surfaces with mod I for paved surfaces, (S) for grassy surfaces. Performed horizontal, vertical head turns on grassy surfaces with decreased gait stability. Remainder of gait trial with mod I.   Ambulation Distance (Feet) 1200 Feet    Assistive device None   Gait Pattern Step-through pattern;Decreased stride length;Decreased dorsiflexion - left;Decreased hip/knee flexion - right;Decreased hip/knee flexion - left;Decreased trunk rotation  posterior trunk lean throughout gait cycle   Ambulation Surface  Level;Unlevel;Indoor;Outdoor;Paved;Grass   Stairs Yes   Stairs Assistance 6: Modified independent (Device/Increase time)   Stairs Assistance Details (indicate cue type and reason) Pt with effective between-session carryover of stair negotiation technique.   Stair Management Technique One rail Left;Step to pattern;Sideways   Number of Stairs 8   Height of Stairs 6   Gait Comments During turning, provided cueing for use of compensatory strategies for impaired VOR (turning eyes, then head, then body; and gaze fixation). Pt required intermittent verbal reinforcement for within-session carryover of technique during gait.   Neuro Re-ed    Neuro Re-ed Details  Due to improvement with gaze stabilization exercises in seated, progressed to standing; paper handout provided to promote pt carryover. See Vestibular treatment and Pt Instructions for details.   Exercises   Exercises Other Exercises   Other Exercises  Explained, demonsrated home exercises for strengthing of L hip extension/ABD. Pt required verbal/tactile cueing for return demonstration. See Pt Instructions for details on exercises, sets, reps performed.         Vestibular Treatment/Exercise - 10/19/14 0001    Vestibular Treatment/Exercise   Vestibular Treatment Provided Gaze   Gaze Exercises X1 Viewing Horizontal;X1 Viewing Vertical   X1 Viewing Horizontal   Foot Position Seated then standing with feet shoulder-width, BUE support on chair   Comments 60 seconds seated; 30 seconds standing   X1 Viewing Vertical   Foot Position Seated then standing (see above)   Comments 60 seconds seated, 30 seconds standing  slow speed of head movement               PT Education - 10/19/14 1120    Education provided Yes   Education Details Progressed HEP. Educated pt on compensatory strategies for impaired VOR. See Pt Instructions.   Person(s) Educated Patient   Methods Explanation;Demonstration;Verbal cues;Handout   Comprehension Verbalized  understanding;Returned demonstration          PT Short Term Goals - 10/19/14 1127    PT SHORT TERM GOAL #1   Title Pt will perform initial HEP with mod I using paper handout to indicate safe compliance, maximize functional gains. Target date: 11/04/14.   PT SHORT TERM GOAL #2   Title Pt will increase FGA score from 16/30 to 19/30 to indicte improved dynamic gait stability. Target date: 11/04/14.   PT SHORT TERM GOAL #3   Title Pt will independently ambulate >500' over level, indoor surfaces with effective obstacle negotiation to indicate increased safety with household ambulation. Target date: 11/04/14.   PT SHORT TERM GOAL #4   Title Pt will independently negotiate 13 stairs with single L rail to indicate pt ability to safely access second floor of home. Target date: 11/04/14.   PT SHORT TERM GOAL #5   Title Perform 6 Minute Walk Test to assess functional endurance, address pt concern with decline in activity tolerance. Target date: 11/04/14.   Baseline 9/14: Baseline distance 1,063'   Status Achieved   PT SHORT TERM GOAL #6   Title Pt will increase self-selected gait speed from 2.78 ft/sec to > 3.0 ft/sec to indicate imcreased efficiency of ambulation. Target date: 11/04/14.           PT Long Term Goals - 10/07/14 1308    PT LONG TERM GOAL #1   Title Pt will improve FGA score from 16/30 to 23/30 to indicate decreased fall risk. Target date: 12/02/14   PT LONG TERM GOAL #2   Title Pt will ambulate 500' over level and unlevel, paved surfaces with mod I using LRAD to indicate increased safety with community mobility. Target date: 12/02/14   PT LONG TERM GOAL #3   Title Pt will negotiate standard ramp and curb step with mod I using LRAD to indicate increased safety traversing community obstacles. Target date: 12/02/14.   PT LONG TERM GOAL #4   Title Pt will increase self-selected gait speed from 2.78 ft/sec to 3.24 ft/sec to indicate substantial change in efficiency of ambulation. Target  date: 12/02/14   PT LONG TERM GOAL #5   Title Pt will improve 6 Minute Walk distance by 120' from baseline to indicate improved functional endurance. Target date: 12/02/14   Additional Long Term Goals   Additional Long Term Goals Yes   PT LONG TERM GOAL #6   Title Pt will verbalize understanding of CVA warning signs, pertinent risk factors to decrease risk of CVA. Target date: 12/02/14   PT LONG TERM GOAL #7   Title Pt will verbalize understanding of fall prevention strategies to decrease risk of falling in home environment. Target date: 12/02/14.   PT LONG TERM GOAL #8   Title Pt will improve Neuro QoL LE score from 44.4% to > 54.4% to indicate significant improvement in quality of life. Target date: 12/02/14.               Plan - 10/19/14 1128    Clinical Impression Statement Skilled session focused on increasing dynamic gait stabillity. Pt educated on compensatory strategies for central  vestibular impairments. Paper handout provided to promote carryover but may need to review.  Noted within-session improvement in slow VOR in seated after pt practiced at home. Pt also demonstrated between=-session carryover of stair negotiation technique without cueing today. Continue per POC.   Pt will benefit from skilled therapeutic intervention in order to improve on the following deficits Abnormal gait;Decreased activity tolerance;Decreased balance;Decreased cognition;Decreased endurance;Decreased coordination;Decreased strength;Decreased safety awareness;Dizziness;Decreased mobility   Rehab Potential Good   Clinical Impairments Affecting Rehab Potential cognitive impairments, financial limitations (high insurance co-pay)   PT Frequency 2x / week  Husband requesting to schedule 1x/week due to high co-pay   PT Duration 8 weeks   PT Treatment/Interventions ADLs/Self Care Home Management;Functional mobility training;Stair training;Patient/family education;DME Instruction;Therapeutic activities;Therapeutic  exercise;Balance training;Neuromuscular re-education;Manual techniques;Vestibular;Gait training   PT Next Visit Plan Read Dr. Warren Danes note from 9/21. Check to see if pt using compensatory strategies for impaired VOR. Consider adding dynamic gait to HEP/   Consulted and Agree with Plan of Care Patient        Problem List Patient Active Problem List   Diagnosis Date Noted  . Accelerated hypertension 09/21/2014  . Type 2 diabetes mellitus with other circulatory complications 09/21/2014  . Hyperlipidemia 09/21/2014  . Thalamic hemorrhage with stroke 07/29/2014  . Hyperlipidemia LDL goal <70 07/29/2014  . Hemorrhagic stroke 06/22/2014  . ICH (intracerebral hemorrhage) 06/22/2014  . Hypertension 05/13/2012  . Diabetes mellitus 05/13/2012    Jorje Guild, PT, DPT [facility] [address] [place], [place], [address] Phone: [phone]   Fax:  [phone] 10/19/2014, 11:33 AM

## 2014-10-19 NOTE — Patient Instructions (Addendum)
Gaze Stabilization: Standing Feet Apart     Keep your glasses on but be sure they're all the way up on your nose for this exercise.  Stand about 5-6 feet away from a wall. Place your visual target ("A") on the wall in front of you. Make sure the "A" is slightly below eye-level.  Turn your head from side to side (as though you're shaking your head "no") while keeping the "A" in focus. If you lose focus or the "A" becomes blurry, slow your head movement down until it's back in focus. Continue for 60 seconds. Repeat twice per day.  Then perform the same exercise, except look at the "A" while moving head up and down (as though you're nodding your head "yes") Do this for 30 seconds, twice per day.   Bracing With Single Leg Bridging (Hook-Lying)   Lie on your back with your LEFT knee bent and right knee straight (as pictured). Push through LEFT foot to lift up hips from bed.  Perform 8 reps, 3 times per day.      Balance, Proprioception: Hip Abduction With Tubing   Sit down to tie RED theraband around LEFT foot/ankle; anchor the other side of the band to stable surface. Stand with left hand on stable surface. In standing, raise LEFT leg out to the side. (You should feel your "back pocket" muscle working.) Do 20 reps, 3 times per day. As your balance improves, progress from holding with one hand to holding with 3 fingers on stable surface.   Gaze Fixation - Compensatory Strategy 2   1. When turning while walking, first turn your eyes in the direction you're turning and find a target to focus on. Then turn your head, then your body.    2. When walking, be sure your heel is the first part of your foot to hit the ground when stepping.   3. When going up the stairs, place both hands on the left rail and face the rail (you're body will be positioned sideways). Step one foot up at a time, leading with you're right foot first.  When doing down the stairs, position your hands/body the  same way as going up and step down one foot at a time, leading with your left first.

## 2014-10-24 MED ORDER — CARVEDILOL 6.25 MG PO TABS: 6.2500 mg | ORAL_TABLET | Freq: Two times a day (BID) | ORAL | Status: DC

## 2014-10-24 NOTE — Telephone Encounter (Signed)
Called and informed pt of medication change, she verbalized understanding. Coreg e-scribed to [facility] in [place]. Pt stated she could come in on Nov 1st for BP check. Appt was made with Dr. Laury Axon. No further questions/concerns at this time. JG//CMA

## 2014-10-24 NOTE — Addendum Note (Signed)
Addended by: Amado Coe A on: 10/24/2014 01:01 PM   Modules accepted: Orders

## 2014-10-24 NOTE — Telephone Encounter (Signed)
I called pt's insurance [facility] [employer]) and they stated that Atenelol, tenormin, carvedilol, simvastatin, lisinopril are listed on pt's formulary, but still may require a PA. Please advise. JG//CMA

## 2014-10-24 NOTE — Telephone Encounter (Signed)
Ok to d/c metoprolol  Start coreg 6.12 bid #60 2 refills-- ov 2-3 weeks for bp check to make sure it is working

## 2014-10-26 ENCOUNTER — Ambulatory Visit: Payer: Self-pay | Admitting: Physical Therapy

## 2014-10-27 ENCOUNTER — Telehealth: Payer: Self-pay | Admitting: Neurology

## 2014-10-27 NOTE — Telephone Encounter (Signed)
Hi, Katrina:         I tried to call pt and deliver the MRI result to her. Called her homephone and mobile phone listed in file but the voice message on these two phones were not from her. Did not leave message for HIPPA concerns. Please call pt and ask her best number and time to call and I will call her. Thanks.        Marvel Plan, MD PhD    Stroke Neurology    10/22/2014    1:49 PM         Lft vnm from patient about her MRI results. Pt has different numbers in the system. Pt gave number or [phone] for call back of results. Rn left vm stating Dr Roda Shutters tried to call her on [phone] to give results but she had different names on voice mail. Rn stated on vm that Dr Roda Shutters will call on the number provided for MRi results.

## 2014-10-27 NOTE — Telephone Encounter (Signed)
Pt calling requesting MRI results. Please call and advise at [phone].

## 2014-10-28 NOTE — Telephone Encounter (Signed)
Called back on the number but go to voice mail directly. Did not leave message. Pt has appointment with me on 12/27/14. Will relay the result then as the result will not alter any management.  Marvel Plan, MD PhD Stroke Neurology 10/28/2014 5:16 PM

## 2014-10-31 ENCOUNTER — Telehealth: Payer: Self-pay

## 2014-10-31 NOTE — Telephone Encounter (Signed)
-----   Message from Marvel Plan, MD sent at 10/22/2014  1:49 PM EDT ----- Hi, Katrina:  I tried to call pt and deliver the MRI result to her. Called her homephone and mobile phone listed in file but the voice message on these two phones were not from her. Did not leave message for HIPPA concerns. Please call pt and ask her best number and time to call and I will call her. Thanks.  Marvel Plan, MD PhD Stroke Neurology 10/22/2014 1:49 PM

## 2014-10-31 NOTE — Telephone Encounter (Signed)
Tried calling patient. Unable to reach. Will send letter.

## 2014-11-02 ENCOUNTER — Ambulatory Visit: Payer: Self-pay | Admitting: Physical Therapy

## 2014-11-02 NOTE — Telephone Encounter (Signed)
This encounter was created in error - please disregard.

## 2014-11-02 NOTE — Telephone Encounter (Signed)
Discussed with patient over the phone, related MRI information to her. 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 All consistent with chronic and controlled hypertension.  Patient stated that she has no blood pressure machine at home and she is not monitoring her blood pressure at home yet. She says she is working on that to get up low-pressure device. She has appointment with PCP on 11/29/2014. She also has appointment with me in 12/27/2014.  I told her that her hypertension need further workup to rule out secondary hypertension. We may have to do renal artery ultrasound and blood tests to rule out secondary malignant hypertension. She expressed understanding and appreciation.  Marvel Plan, MD PhD Stroke Neurology 11/02/2014 6:46 PM

## 2014-11-07 ENCOUNTER — Telehealth: Payer: Self-pay | Admitting: Family Medicine

## 2014-11-07 NOTE — Telephone Encounter (Signed)
Please advise      KP 

## 2014-11-07 NOTE — Telephone Encounter (Signed)
Relation to NW:GNFA Call back number: [phone]    Reason for call:  Patient would like PCP to write a  letter for insurance indicating patient is under Dr, Laury Axon care and she is currently receiving disability and physical therapy. Patient states spouse will pick up letter.

## 2014-11-08 ENCOUNTER — Encounter: Payer: Self-pay | Admitting: Family Medicine

## 2014-11-08 ENCOUNTER — Telehealth: Payer: Self-pay | Admitting: Neurology

## 2014-11-08 NOTE — Telephone Encounter (Signed)
Please fax [phone] [employer] [place]

## 2014-11-08 NOTE — Telephone Encounter (Signed)
Msg left advising the letter is ready for pick up.     KP

## 2014-11-08 NOTE — Telephone Encounter (Signed)
Letter done

## 2014-11-08 NOTE — Telephone Encounter (Signed)
Pt called and would like to know if she can have a letter stating that she is under Dr. Warren Danes care, this is pertaining to disability. 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 Please call and advise [phone]

## 2014-11-09 ENCOUNTER — Ambulatory Visit: Payer: Self-pay | Admitting: Physical Therapy

## 2014-11-09 NOTE — Telephone Encounter (Signed)
Lft message for patient to call back about clarification for the letter for disability.

## 2014-11-10 NOTE — Telephone Encounter (Signed)
Rn call patient about a letter for her disability. Pt stated its to get her car back. Rn ask patient if they could send a form to [employer]. Patient stated they just need a letter stating her medical condition. Rn stated that Dr. Roda ShuttersXu will not be in the office until Monday. Pt stated she understood that it will be fax on Monday. Fax number for company is  [phone]. The letter needs to state her medical history and her limitations.

## 2014-11-10 NOTE — Telephone Encounter (Signed)
Patient is returning a call. °

## 2014-11-10 NOTE — Telephone Encounter (Signed)
Letter faxed.      KP 

## 2014-11-16 ENCOUNTER — Ambulatory Visit: Payer: Self-pay | Admitting: Physical Therapy

## 2014-11-16 NOTE — Telephone Encounter (Signed)
Letter was done for patient for her vehicle insurance. The letter is not a disability letter, it only states her medical condition she had since May 2016. The letter states her last office visit and her current medical aliment at last office visit. Letter send to patients home address.

## 2014-11-23 ENCOUNTER — Ambulatory Visit: Payer: Self-pay | Admitting: Physical Therapy

## 2014-11-24 ENCOUNTER — Telehealth: Payer: Self-pay | Admitting: Family Medicine

## 2014-11-24 NOTE — Telephone Encounter (Signed)
Pt's husband dropped off claim form for continuing disability insurance benefits, pt would like the paperwork faxed directly to the company. Paperwork was placed in your tray at the front office

## 2014-11-28 DIAGNOSIS — Z7689 Persons encountering health services in other specified circumstances: Secondary | ICD-10-CM

## 2014-11-28 NOTE — Telephone Encounter (Signed)
Forms filled out as much as possible and forwarded to Dr. Lowne. JG//CMA  

## 2014-11-29 ENCOUNTER — Ambulatory Visit: Payer: 59 | Admitting: Family Medicine

## 2014-11-30 ENCOUNTER — Ambulatory Visit: Payer: Self-pay | Admitting: Physical Therapy

## 2014-11-30 NOTE — Telephone Encounter (Signed)
Completed forms faxed to [phone]. Sent for scanning, pt did not request copy. JG//CMA

## 2014-12-27 ENCOUNTER — Ambulatory Visit: Payer: 59 | Admitting: Neurology

## 2015-02-10 ENCOUNTER — Encounter: Payer: Self-pay | Admitting: Family Medicine

## 2015-02-16 ENCOUNTER — Ambulatory Visit (INDEPENDENT_AMBULATORY_CARE_PROVIDER_SITE_OTHER): Payer: BLUE CROSS/BLUE SHIELD | Admitting: Family Medicine

## 2015-02-16 ENCOUNTER — Encounter: Payer: Self-pay | Admitting: Family Medicine

## 2015-02-16 ENCOUNTER — Telehealth: Payer: Self-pay | Admitting: Family Medicine

## 2015-02-16 ENCOUNTER — Other Ambulatory Visit: Payer: Self-pay

## 2015-02-16 VITALS — BP 184/108 | HR 72 | Temp 98.0°F | Ht 70.0 in | Wt 190.0 lb

## 2015-02-16 DIAGNOSIS — E785 Hyperlipidemia, unspecified: Secondary | ICD-10-CM

## 2015-02-16 DIAGNOSIS — I10 Essential (primary) hypertension: Secondary | ICD-10-CM

## 2015-02-16 DIAGNOSIS — E11319 Type 2 diabetes mellitus with unspecified diabetic retinopathy without macular edema: Secondary | ICD-10-CM | POA: Diagnosis not present

## 2015-02-16 DIAGNOSIS — E1165 Type 2 diabetes mellitus with hyperglycemia: Secondary | ICD-10-CM

## 2015-02-16 DIAGNOSIS — R8290 Unspecified abnormal findings in urine: Secondary | ICD-10-CM | POA: Diagnosis not present

## 2015-02-16 LAB — POCT URINALYSIS DIPSTICK
Bilirubin, UA: NEGATIVE
Ketones, UA: NEGATIVE
NITRITE UA: NEGATIVE
PH UA: 6
SPEC GRAV UA: 1.02
UROBILINOGEN UA: 0.2

## 2015-02-16 MED ORDER — SAXAGLIPTIN HCL 5 MG PO TABS: 5.0000 mg | ORAL_TABLET | Freq: Every day | ORAL | Status: DC

## 2015-02-16 MED ORDER — POTASSIUM CHLORIDE CRYS ER 20 MEQ PO TBCR: 20.0000 meq | EXTENDED_RELEASE_TABLET | Freq: Two times a day (BID) | ORAL | Status: DC

## 2015-02-16 MED ORDER — LOVASTATIN 20 MG PO TABS: 40.0000 mg | ORAL_TABLET | Freq: Every day | ORAL | Status: DC

## 2015-02-16 MED ORDER — BENAZEPRIL HCL 20 MG PO TABS: 20.0000 mg | ORAL_TABLET | Freq: Every day | ORAL | Status: DC

## 2015-02-16 MED ORDER — CARVEDILOL 6.25 MG PO TABS: 6.2500 mg | ORAL_TABLET | Freq: Two times a day (BID) | ORAL | Status: DC

## 2015-02-16 MED ORDER — METOPROLOL TARTRATE 50 MG PO TABS: 37.5000 mg | ORAL_TABLET | Freq: Two times a day (BID) | ORAL | Status: DC

## 2015-02-16 MED ORDER — AMLODIPINE BESYLATE 10 MG PO TABS: 10.0000 mg | ORAL_TABLET | Freq: Every day | ORAL | Status: DC

## 2015-02-16 MED ORDER — LOVASTATIN 40 MG PO TABS: 40.0000 mg | ORAL_TABLET | Freq: Every day | ORAL | Status: DC

## 2015-02-16 NOTE — Assessment & Plan Note (Signed)
Uncontrolled --- restart norvasc, coreg, lisinopril rto 2-3 weeks

## 2015-02-16 NOTE — Assessment & Plan Note (Signed)
  Restart lovastatin Recheck labs 3 months

## 2015-02-16 NOTE — Progress Notes (Signed)
Patient ID: Stephanie Lloyd, female    DOB: 1970-12-23  Age: 45 y.o. MRN: [id number]    Subjective:  Subjective HPI Stephanie Lloyd presents for f/u bp , cholesterol and dm.  Pt stopped all meds secondary to cost.  HPI HYPERTENSION  Blood pressure range-not checking  Chest pain- no      Dyspnea- no Lightheadedness- no   Edema- no Other side effects - no   Medication compliance: poor Low salt diet- no  DIABETES  Blood Sugar ranges-not checking   Polyuria- no New Visual problems- no Hypoglycemic symptoms- no Other side effects-no Medication compliance - poor Last eye exam- due Foot exam- today  HYPERLIPIDEMIA  Medication compliance- no RUQ pain- no  Muscle aches- no Other side effects-no  Review of Systems  Constitutional: Negative for diaphoresis, appetite change, fatigue and unexpected weight change.  Eyes: Negative for pain, redness and visual disturbance.  Respiratory: Negative for cough, chest tightness, shortness of breath and wheezing.   Cardiovascular: Negative for chest pain, palpitations and leg swelling.  Endocrine: Negative for cold intolerance, heat intolerance, polydipsia, polyphagia and polyuria.  Genitourinary: Negative for dysuria, frequency and difficulty urinating.  Neurological: Negative for dizziness, light-headedness, numbness and headaches.    History Past Medical History  Diagnosis Date  . Immune deficiency disorder ([place])   . Diabetes ([place])   . Diabetes mellitus without complication ([place])   . Hypertension     She has no past surgical history on file.   Her family history is not on file.She reports that she has never smoked. She has never used smokeless tobacco. She reports that she does not drink alcohol or use illicit drugs.  Current Outpatient Prescriptions on File Prior to Visit  Medication Sig Dispense Refill  . glucose blood (ONETOUCH VERIO) test strip Check blood sugar twice daily. Dx: E11.9 (Patient not taking: Reported on  02/16/2015) 100 each prn  . ONETOUCH DELICA LANCETS FINE MISC Check blood sugar twice a day. Dx:E11.9 (Patient not taking: Reported on 02/16/2015) 100 each prn   No current facility-administered medications on file prior to visit.     Objective:  Objective Physical Exam  Constitutional: She is oriented to person, place, and time. She appears well-developed and well-nourished.  HENT:  Head: Normocephalic and atraumatic.  Eyes: Conjunctivae and EOM are normal.  Neck: Normal range of motion. Neck supple. No JVD present. Carotid bruit is not present. No thyromegaly present.  Cardiovascular: Normal rate, regular rhythm and normal heart sounds.   No murmur heard. Pulmonary/Chest: Effort normal and breath sounds normal. No respiratory distress. She has no wheezes. She has no rales. She exhibits no tenderness.  Musculoskeletal: She exhibits no edema.  Neurological: She is alert and oriented to person, place, and time.  Psychiatric: She has a normal mood and affect.  Nursing note and vitals reviewed.  BP 184/108 mmHg  Pulse 72  Temp(Src) 98 F (36.7 C) (Oral)  Ht '5\' 10"'  (1.778 m)  Wt 190 lb (86.183 kg)  BMI 27.26 kg/m2  SpO2 98% Wt Readings from Last 3 Encounters:  02/16/15 190 lb (86.183 kg)  10/17/14 194 lb (87.998 kg)  10/17/14 194 lb (87.998 kg)     Lab Results  Component Value Date   WBC 7.7 09/27/2014   HGB 12.6 09/27/2014   HCT 38.5 09/27/2014   PLT 235 09/27/2014   GLUCOSE 175* 09/27/2014   CHOL 220* 06/23/2014   TRIG 41 06/23/2014   HDL 72 06/23/2014   LDLCALC 140* 06/23/2014  ALT 19 09/27/2014   AST 19 09/27/2014   NA 139 09/27/2014   K 4.2 09/27/2014   CL 104 09/27/2014   CREATININE 1.43* 09/27/2014   BUN 23* 09/27/2014   CO2 26 09/27/2014   TSH 0.548 06/23/2014   INR 1.12 09/27/2014   HGBA1C 9.0* 06/23/2014    Ct Head Wo Contrast  09/27/2014  CLINICAL DATA:  Slurred speech for 5 days. History of hemorrhagic CVA. Initial encounter. EXAM: CT HEAD WITHOUT  CONTRAST TECHNIQUE: Contiguous axial images were obtained from the base of the skull through the vertex without intravenous contrast. COMPARISON:  06/23/2014 head CT. FINDINGS: The right thalamic hematoma has resolved without significant sequela. There are chronic small vessel ischemic changes within the periventricular white matter and basal ganglia, asymmetric to the right. There is no evidence of acute intracranial hemorrhage, mass lesion, brain edema or extra-axial fluid collection. The ventricles and subarachnoid spaces are prominent but stable. There is no CT evidence of acute cortical infarction. The visualized paranasal sinuses, mastoid air cells and middle ears are clear. The calvarium is intact. IMPRESSION: No acute findings or explanation for the patient's symptoms. Interval resolution of previously demonstrated right thalamic hemorrhage. Electronically Signed   By: Richardean Sale M.D.   On: 09/27/2014 12:30     Assessment & Plan:  Plan I have discontinued Ms. Degenhart's atorvastatin, saxagliptin HCl, and lisinopril. I have also changed her Metoprolol Tartrate to metoprolol. Additionally, I am having her start on benazepril and saxagliptin HCl. Lastly, I am having her maintain her [facility] DELICA LANCETS FINE, glucose blood, amLODipine, potassium chloride SA, and carvedilol.  Meds ordered this encounter  Medications  . amLODipine (NORVASC) 10 MG tablet    Sig: Take 1 tablet (10 mg total) by mouth daily.    Dispense:  90 tablet    Refill:  1  . benazepril (LOTENSIN) 20 MG tablet    Sig: Take 1 tablet (20 mg total) by mouth daily.    Dispense:  90 tablet    Refill:  3  . DISCONTD: lovastatin (MEVACOR) 40 MG tablet    Sig: Take 1 tablet (40 mg total) by mouth at bedtime.    Dispense:  30 tablet    Refill:  2  . potassium chloride SA (K-DUR,KLOR-CON) 20 MEQ tablet    Sig: Take 1 tablet (20 mEq total) by mouth 2 (two) times daily.    Dispense:  60 tablet    Refill:  5  . metoprolol  (LOPRESSOR) 50 MG tablet    Sig: Take 1 tablet (50 mg total) by mouth 2 (two) times daily.    Dispense:  60 tablet    Refill:  2  . carvedilol (COREG) 6.25 MG tablet    Sig: Take 1 tablet (6.25 mg total) by mouth 2 (two) times daily with a meal.    Dispense:  60 tablet    Refill:  2  . saxagliptin HCl ([name]) 5 MG TABS tablet    Sig: Take 1 tablet (5 mg total) by mouth daily.    Dispense:  30 tablet    Refill:  2    Problem List Items Addressed This Visit      Unprioritized   Hypertension - Primary    Uncontrolled --- restart norvasc, coreg, lisinopril rto 2-3 weeks      Relevant Medications   amLODipine (NORVASC) 10 MG tablet   benazepril (LOTENSIN) 20 MG tablet   potassium chloride SA (K-DUR,KLOR-CON) 20 MEQ tablet   metoprolol (LOPRESSOR) 50 MG  tablet   carvedilol (COREG) 6.25 MG tablet   Other Relevant Orders   Lipid panel   POCT urinalysis dipstick (Completed)   Comp Met (CMET)   Hyperlipidemia     Restart lovastatin Recheck labs 3 months      Relevant Medications   amLODipine (NORVASC) 10 MG tablet   benazepril (LOTENSIN) 20 MG tablet   metoprolol (LOPRESSOR) 50 MG tablet   carvedilol (COREG) 6.25 MG tablet   Other Relevant Orders   Lipid panel   POCT urinalysis dipstick (Completed)   Comp Met (CMET)   Diabetes mellitus (HCC)    Restart onglyza 5 mg Check glucose levels bid  Recheck labs 3 months      Relevant Medications   benazepril (LOTENSIN) 20 MG tablet   saxagliptin HCl ([name]) 5 MG TABS tablet    Other Visit Diagnoses    Uncontrolled type 2 diabetes mellitus with retinopathy, without long-term current use of insulin, macular edema presence unspecified, unspecified retinopathy severity (HCC)        Relevant Medications    benazepril (LOTENSIN) 20 MG tablet    saxagliptin HCl ([name]) 5 MG TABS tablet    Other Relevant Orders    Lipid panel    POCT urinalysis dipstick (Completed)    Comp Met (CMET)    Abnormal urine findings         Relevant Orders    Urine culture       Follow-up: Return in about 2 weeks (around 03/02/2015), or if symptoms worsen or fail to improve, for hypertension, diabetes II, hyperlipidemia.  Garnet Koyanagi, DO

## 2015-02-16 NOTE — Assessment & Plan Note (Signed)
Restart onglyza 5 mg Check glucose levels bid  Recheck labs 3 months

## 2015-02-16 NOTE — Progress Notes (Signed)
Pre visit review using our clinic review tool, if applicable. No additional management support is needed unless otherwise documented below in the visit note. 

## 2015-02-16 NOTE — Telephone Encounter (Signed)
Pt's son dropped off Disability Insurance Benefits Form at front desk.

## 2015-02-16 NOTE — Patient Instructions (Signed)

## 2015-02-17 LAB — COMPREHENSIVE METABOLIC PANEL
ALBUMIN: 4.1 g/dL (ref 3.5–5.2)
ALK PHOS: 105 U/L (ref 39–117)
ALT: 13 U/L (ref 0–35)
AST: 14 U/L (ref 0–37)
BILIRUBIN TOTAL: 0.3 mg/dL (ref 0.2–1.2)
BUN: 18 mg/dL (ref 6–23)
CALCIUM: 9.4 mg/dL (ref 8.4–10.5)
CO2: 25 mEq/L (ref 19–32)
Chloride: 100 mEq/L (ref 96–112)
Creatinine, Ser: 1.22 mg/dL — ABNORMAL HIGH (ref 0.40–1.20)
GFR: 60.81 mL/min (ref >60.00)
Glucose, Bld: 356 mg/dL — ABNORMAL HIGH (ref 70–99)
POTASSIUM: 3.6 meq/L (ref 3.5–5.1)
Sodium: 135 mEq/L (ref 135–145)
TOTAL PROTEIN: 7.7 g/dL (ref 6.0–8.3)

## 2015-02-17 LAB — LIPID PANEL
CHOLESTEROL: 235 mg/dL — AB (ref 0–200)
HDL: 58 mg/dL (ref >39.00)
LDL Cholesterol: 147 mg/dL — ABNORMAL HIGH (ref 0–99)
NONHDL: 177.47
TRIGLYCERIDES: 150 mg/dL — AB (ref 0.0–149.0)
Total CHOL/HDL Ratio: 4
VLDL: 30 mg/dL (ref 0.0–40.0)

## 2015-02-17 NOTE — Telephone Encounter (Signed)
Form filled out as much as possible and forwarded to Dr. Laury Axon. JG//CMA

## 2015-02-18 LAB — URINE CULTURE: Colony Count: 75000

## 2015-02-21 ENCOUNTER — Telehealth: Payer: Self-pay | Admitting: *Deleted

## 2015-02-21 ENCOUNTER — Encounter: Payer: Self-pay | Admitting: Physical Therapy

## 2015-02-21 NOTE — Therapy (Signed)
[facility] [address] [place], [place], [address] Phone: [phone]   Fax:  [phone]  Patient Details  Name: Stephanie Lloyd MRN: [id number] Date of Birth: August 30, 1970 Referring Provider:  No ref. provider found  Encounter Date: 02/21/2015 PHYSICAL THERAPY DISCHARGE SUMMARY  Visits from Start of Care: 3  Current functional level related to goals / functional outcomes: Unknown, as patient did not return to PT after third session. See goals below.  .     PT Long Term Goals - 10/07/14 1308    PT LONG TERM GOAL #1   Title Pt will improve FGA score from 16/30 to 23/30 to indicate decreased fall risk. Target date: 12/02/14   PT LONG TERM GOAL #2   Title Pt will ambulate 500' over level and unlevel, paved surfaces with mod I using LRAD to indicate increased safety with community mobility. Target date: 12/02/14   PT LONG TERM GOAL #3   Title Pt will negotiate standard ramp and curb step with mod I using LRAD to indicate increased safety traversing community obstacles. Target date: 12/02/14.   PT LONG TERM GOAL #4   Title Pt will increase self-selected gait speed from 2.78 ft/sec to 3.24 ft/sec to indicate substantial change in efficiency of ambulation. Target date: 12/02/14   PT LONG TERM GOAL #5   Title Pt will improve 6 Minute Walk distance by 120' from baseline to indicate improved functional endurance. Target date: 12/02/14   Additional Long Term Goals   Additional Long Term Goals Yes   PT LONG TERM GOAL #6   Title Pt will verbalize understanding of CVA warning signs, pertinent risk factors to decrease risk of CVA. Target date: 12/02/14   PT LONG TERM GOAL #7   Title Pt will verbalize understanding of fall prevention strategies to decrease risk of falling in home environment. Target date: 12/02/14.   PT LONG TERM GOAL #8   Title Pt will improve Neuro QoL LE score from 44.4% to > 54.4% to indicate significant improvement in  quality of life. Target date: 12/02/14.      Remaining deficits: Unknown, as pt did not return to PT after 3rd session. Patient elected to discontinue PT due to high insurance co-pay.   Education / Equipment: HEP; compensatory strategies for dizziness with mobility.  Plan: Patient agrees to discharge.  Patient goals were not met. Patient is being discharged due to not returning since the last visit.  ?????         Billie Ruddy, PT, DPT [facility] [address] [place], [place], [address] Phone: [phone]   Fax:  [phone] 02/21/2015, 9:48 AM

## 2015-02-21 NOTE — Telephone Encounter (Signed)
Received fax paperwork from [employer] [place] for Medical Records to determine Disability Services; forwarded to jordan for scan/Email/SLS 01/24  

## 2015-02-22 NOTE — Addendum Note (Signed)
Addended by: Erin Hearing on: 02/22/2015 01:43 PM   Modules accepted: Orders

## 2015-03-02 ENCOUNTER — Ambulatory Visit: Payer: Self-pay | Admitting: Family Medicine

## 2015-03-09 ENCOUNTER — Encounter: Payer: Self-pay | Admitting: Family Medicine

## 2015-03-09 NOTE — Telephone Encounter (Signed)
Which insulin is on [facility] list

## 2015-03-10 MED ORDER — PEN NEEDLES 31G X 5 MM MISC: 1.0000 | Status: DC

## 2015-03-10 MED ORDER — INSULIN NPH ISOPHANE & REGULAR (70-30) 100 UNIT/ML ~~LOC~~ SUSP
SUBCUTANEOUS | Status: DC
Start: 2015-03-10 — End: 2015-07-29

## 2015-03-10 NOTE — Telephone Encounter (Signed)
novolin 70/30  20 u bid 30-45 min before meals And endo referral

## 2015-03-10 NOTE — Telephone Encounter (Signed)
Novolin N, R and 70/30 for 25 dollars. Please advise    KP

## 2015-03-30 ENCOUNTER — Other Ambulatory Visit: Payer: Self-pay

## 2015-03-30 MED ORDER — "INSULIN SYRINGE-NEEDLE U-100 25G X 5/8"" 1 ML MISC": Status: DC

## 2015-04-12 ENCOUNTER — Telehealth: Payer: Self-pay | Admitting: *Deleted

## 2015-04-12 NOTE — Telephone Encounter (Signed)
Received Temporary Total Disability Deferment  Request form; forwarded to provider/SLS

## 2015-04-13 ENCOUNTER — Telehealth: Payer: Self-pay | Admitting: Family Medicine

## 2015-04-13 NOTE — Telephone Encounter (Signed)
Caller name:Self  Can be [phone]   Reason for call: Patient wants to know if you have any lancets that you can give her

## 2015-04-14 MED ORDER — ONETOUCH DELICA LANCETS FINE MISC: Status: DC

## 2015-04-14 NOTE — Telephone Encounter (Signed)
Rx faxed.    KP 

## 2015-04-18 LAB — HM DIABETES EYE EXAM

## 2015-04-26 ENCOUNTER — Telehealth: Payer: Self-pay | Admitting: Family Medicine

## 2015-04-26 NOTE — Telephone Encounter (Signed)
Pt called stating that she wants to send her husband to pick up one touch verio test strips here in our office. She said she knows we have some so they can get them from us.   Pt phone # [phone] Please call so her husband knows when to pick up.

## 2015-04-27 NOTE — Telephone Encounter (Signed)
Patient has been made aware the test strips are ready for pick up.   KP

## 2015-05-23 ENCOUNTER — Telehealth: Payer: Self-pay | Admitting: Family Medicine

## 2015-05-23 ENCOUNTER — Other Ambulatory Visit (INDEPENDENT_AMBULATORY_CARE_PROVIDER_SITE_OTHER): Payer: BLUE CROSS/BLUE SHIELD

## 2015-05-23 ENCOUNTER — Telehealth: Payer: Self-pay | Admitting: *Deleted

## 2015-05-23 DIAGNOSIS — E1165 Type 2 diabetes mellitus with hyperglycemia: Secondary | ICD-10-CM

## 2015-05-23 DIAGNOSIS — E11319 Type 2 diabetes mellitus with unspecified diabetic retinopathy without macular edema: Secondary | ICD-10-CM

## 2015-05-23 DIAGNOSIS — I10 Essential (primary) hypertension: Secondary | ICD-10-CM | POA: Diagnosis not present

## 2015-05-23 DIAGNOSIS — E785 Hyperlipidemia, unspecified: Secondary | ICD-10-CM

## 2015-05-23 LAB — LIPID PANEL
CHOL/HDL RATIO: 4
Cholesterol: 255 mg/dL — ABNORMAL HIGH (ref 0–200)
HDL: 68.8 mg/dL (ref >39.00)
LDL Cholesterol: 164 mg/dL — ABNORMAL HIGH (ref 0–99)
NONHDL: 185.75
TRIGLYCERIDES: 110 mg/dL (ref 0.0–149.0)
VLDL: 22 mg/dL (ref 0.0–40.0)

## 2015-05-23 LAB — COMPREHENSIVE METABOLIC PANEL
ALT: 15 U/L (ref 0–35)
AST: 13 U/L (ref 0–37)
Albumin: 4.5 g/dL (ref 3.5–5.2)
Alkaline Phosphatase: 102 U/L (ref 39–117)
BILIRUBIN TOTAL: 0.5 mg/dL (ref 0.2–1.2)
BUN: 16 mg/dL (ref 6–23)
CALCIUM: 9.6 mg/dL (ref 8.4–10.5)
CO2: 28 meq/L (ref 19–32)
CREATININE: 1.13 mg/dL (ref 0.40–1.20)
Chloride: 100 mEq/L (ref 96–112)
GFR: 66.36 mL/min (ref >60.00)
Glucose, Bld: 189 mg/dL — ABNORMAL HIGH (ref 70–99)
Potassium: 3.4 mEq/L — ABNORMAL LOW (ref 3.5–5.1)
SODIUM: 136 meq/L (ref 135–145)
Total Protein: 8.4 g/dL — ABNORMAL HIGH (ref 6.0–8.3)

## 2015-05-23 LAB — HEMOGLOBIN A1C: Hgb A1c MFr Bld: 8.4 % — ABNORMAL HIGH (ref 4.6–6.5)

## 2015-05-23 NOTE — Telephone Encounter (Signed)
Pt dropped off Disability paperwork to be filled out, put in Jessica's tray at front desk

## 2015-05-23 NOTE — Telephone Encounter (Signed)
Filled out as much as possible and forwarded to Dr. Lowne-Chase. JG//CMA 

## 2015-05-23 NOTE — Telephone Encounter (Signed)
See other phone note from earlier today. JG//CMA

## 2015-05-28 ENCOUNTER — Encounter: Payer: Self-pay | Admitting: Family Medicine

## 2015-05-29 NOTE — Telephone Encounter (Signed)
Completed form faxed successfully to [phone]. Pt aware via MyChart. JG//CMA

## 2015-05-31 ENCOUNTER — Other Ambulatory Visit: Payer: Self-pay

## 2015-05-31 MED ORDER — GLIMEPIRIDE 2 MG PO TABS: 2.0000 mg | ORAL_TABLET | Freq: Every day | ORAL | Status: DC

## 2015-06-02 ENCOUNTER — Telehealth: Payer: Self-pay | Admitting: Family Medicine

## 2015-06-02 NOTE — Telephone Encounter (Signed)
Pt called in to request to speak with CMA directly. She says that she would like to know the new medications to take. She says that CMA informed her spouse but he doesn't quite remember .    Please advise further.   CB: [phone]

## 2015-06-02 NOTE — Telephone Encounter (Signed)
Detailed message left advising Amaryl sent to the pharmacy and to take the rest of the medications ar directed, I advised to call back with any questions or concerns.    KP

## 2015-06-23 ENCOUNTER — Telehealth: Payer: Self-pay | Admitting: Family Medicine

## 2015-06-23 NOTE — Telephone Encounter (Signed)
Pt's Husband dropped off paperwork to be filled out. (Permanent Disabiltiy Paperwork), pt wants it to be faxed.

## 2015-06-27 NOTE — Telephone Encounter (Signed)
Filled out as much as possible and forwarded to Dr. Lowne-Chase. JG//CMA 

## 2015-06-30 ENCOUNTER — Ambulatory Visit: Payer: BLUE CROSS/BLUE SHIELD | Admitting: Family Medicine

## 2015-07-19 ENCOUNTER — Telehealth: Payer: Self-pay

## 2015-07-19 NOTE — Telephone Encounter (Signed)
Received Permanent Disability Documents to be completed.  Per provider, pt needs an office visit.  Called and left a message on home for call back.

## 2015-07-21 ENCOUNTER — Encounter: Payer: Self-pay | Admitting: *Deleted

## 2015-07-21 NOTE — Telephone Encounter (Signed)
Called pt and left message for call back. Also sent pt a message on MyChart. JG//CMA

## 2015-07-26 ENCOUNTER — Encounter: Payer: Self-pay | Admitting: Family Medicine

## 2015-07-28 ENCOUNTER — Encounter (HOSPITAL_BASED_OUTPATIENT_CLINIC_OR_DEPARTMENT_OTHER): Payer: Self-pay | Admitting: *Deleted

## 2015-07-28 ENCOUNTER — Encounter: Payer: Self-pay | Admitting: Family Medicine

## 2015-07-28 ENCOUNTER — Emergency Department (HOSPITAL_BASED_OUTPATIENT_CLINIC_OR_DEPARTMENT_OTHER): Payer: BLUE CROSS/BLUE SHIELD

## 2015-07-28 ENCOUNTER — Ambulatory Visit (INDEPENDENT_AMBULATORY_CARE_PROVIDER_SITE_OTHER): Payer: BLUE CROSS/BLUE SHIELD | Admitting: Family Medicine

## 2015-07-28 ENCOUNTER — Inpatient Hospital Stay (HOSPITAL_BASED_OUTPATIENT_CLINIC_OR_DEPARTMENT_OTHER)
Admission: EM | Admit: 2015-07-28 | Discharge: 2015-08-01 | DRG: 305 | Disposition: A | Discharge status: Home / Self Care | Payer: BLUE CROSS/BLUE SHIELD | Attending: Internal Medicine | Admitting: Internal Medicine

## 2015-07-28 VITALS — BP 218/120 | HR 76 | Temp 97.8°F | Wt 193.6 lb

## 2015-07-28 DIAGNOSIS — I10 Essential (primary) hypertension: Secondary | ICD-10-CM

## 2015-07-28 DIAGNOSIS — I161 Hypertensive emergency: Principal | ICD-10-CM | POA: Diagnosis present

## 2015-07-28 DIAGNOSIS — E1122 Type 2 diabetes mellitus with diabetic chronic kidney disease: Secondary | ICD-10-CM | POA: Diagnosis present

## 2015-07-28 DIAGNOSIS — Z6827 Body mass index (BMI) 27.0-27.9, adult: Secondary | ICD-10-CM

## 2015-07-28 DIAGNOSIS — I619 Nontraumatic intracerebral hemorrhage, unspecified: Secondary | ICD-10-CM | POA: Diagnosis present

## 2015-07-28 DIAGNOSIS — E1159 Type 2 diabetes mellitus with other circulatory complications: Secondary | ICD-10-CM | POA: Diagnosis present

## 2015-07-28 DIAGNOSIS — M79602 Pain in left arm: Secondary | ICD-10-CM | POA: Diagnosis present

## 2015-07-28 DIAGNOSIS — Z888 Allergy status to other drugs, medicaments and biological substances status: Secondary | ICD-10-CM

## 2015-07-28 DIAGNOSIS — I159 Secondary hypertension, unspecified: Secondary | ICD-10-CM

## 2015-07-28 DIAGNOSIS — Z794 Long term (current) use of insulin: Secondary | ICD-10-CM

## 2015-07-28 DIAGNOSIS — Z9114 Patient's other noncompliance with medication regimen: Secondary | ICD-10-CM

## 2015-07-28 DIAGNOSIS — N183 Chronic kidney disease, stage 3 (moderate): Secondary | ICD-10-CM | POA: Diagnosis present

## 2015-07-28 DIAGNOSIS — Z91148 Patient's other noncompliance with medication regimen for other reason: Secondary | ICD-10-CM

## 2015-07-28 DIAGNOSIS — Z8673 Personal history of transient ischemic attack (TIA), and cerebral infarction without residual deficits: Secondary | ICD-10-CM

## 2015-07-28 DIAGNOSIS — E1165 Type 2 diabetes mellitus with hyperglycemia: Secondary | ICD-10-CM | POA: Diagnosis present

## 2015-07-28 DIAGNOSIS — E785 Hyperlipidemia, unspecified: Secondary | ICD-10-CM | POA: Diagnosis present

## 2015-07-28 DIAGNOSIS — E669 Obesity, unspecified: Secondary | ICD-10-CM | POA: Diagnosis present

## 2015-07-28 DIAGNOSIS — I160 Hypertensive urgency: Secondary | ICD-10-CM

## 2015-07-28 DIAGNOSIS — Z79899 Other long term (current) drug therapy: Secondary | ICD-10-CM

## 2015-07-28 DIAGNOSIS — Z7984 Long term (current) use of oral hypoglycemic drugs: Secondary | ICD-10-CM

## 2015-07-28 DIAGNOSIS — I129 Hypertensive chronic kidney disease with stage 1 through stage 4 chronic kidney disease, or unspecified chronic kidney disease: Secondary | ICD-10-CM | POA: Diagnosis present

## 2015-07-28 DIAGNOSIS — D849 Immunodeficiency, unspecified: Secondary | ICD-10-CM | POA: Diagnosis present

## 2015-07-28 DIAGNOSIS — E876 Hypokalemia: Secondary | ICD-10-CM | POA: Diagnosis present

## 2015-07-28 HISTORY — DX: Cerebral infarction, unspecified: I63.9

## 2015-07-28 LAB — CBC
HEMATOCRIT: 40 % (ref 36.0–46.0)
Hemoglobin: 13.3 g/dL (ref 12.0–15.0)
MCH: 26.7 pg (ref 26.0–34.0)
MCHC: 33.3 g/dL (ref 30.0–36.0)
MCV: 80.2 fL (ref 78.0–100.0)
Platelets: 173 10*3/uL (ref 150–400)
RBC: 4.99 MIL/uL (ref 3.87–5.11)
RDW: 13.5 % (ref 11.5–15.5)
WBC: 8.7 10*3/uL (ref 4.0–10.5)

## 2015-07-28 LAB — URINE MICROSCOPIC-ADD ON

## 2015-07-28 LAB — COMPREHENSIVE METABOLIC PANEL
ALK PHOS: 89 U/L (ref 38–126)
ALT: 15 U/L (ref 14–54)
AST: 20 U/L (ref 15–41)
Albumin: 3.6 g/dL (ref 3.5–5.0)
Anion gap: 7 (ref 5–15)
BUN: 17 mg/dL (ref 6–20)
CALCIUM: 8.1 mg/dL — AB (ref 8.9–10.3)
CO2: 22 mmol/L (ref 22–32)
CREATININE: 1.21 mg/dL — AB (ref 0.44–1.00)
Chloride: 107 mmol/L (ref 101–111)
GFR, EST NON AFRICAN AMERICAN: 52 mL/min — AB (ref >60)
Glucose, Bld: 213 mg/dL — ABNORMAL HIGH (ref 65–99)
Potassium: 3.4 mmol/L — ABNORMAL LOW (ref 3.5–5.1)
Sodium: 136 mmol/L (ref 135–145)
Total Bilirubin: 0.4 mg/dL (ref 0.3–1.2)
Total Protein: 6.4 g/dL — ABNORMAL LOW (ref 6.5–8.1)

## 2015-07-28 LAB — URINALYSIS, ROUTINE W REFLEX MICROSCOPIC
BILIRUBIN URINE: NEGATIVE
Glucose, UA: 250 mg/dL — AB
HGB URINE DIPSTICK: NEGATIVE
Ketones, ur: NEGATIVE mg/dL
Nitrite: NEGATIVE
PH: 5.5 (ref 5.0–8.0)
Protein, ur: NEGATIVE mg/dL
SPECIFIC GRAVITY, URINE: 1.01 (ref 1.005–1.030)

## 2015-07-28 LAB — TROPONIN I: Troponin I: <0.03 ng/mL (ref <0.03)

## 2015-07-28 LAB — CBG MONITORING, ED: Glucose-Capillary: 302 mg/dL — ABNORMAL HIGH (ref 65–99)

## 2015-07-28 LAB — MAGNESIUM: MAGNESIUM: 1.8 mg/dL (ref 1.7–2.4)

## 2015-07-28 MED ORDER — METOPROLOL TARTRATE 25 MG PO TABS: 37.5000 mg | ORAL_TABLET | Freq: Two times a day (BID) | ORAL | Status: DC

## 2015-07-28 MED ORDER — SODIUM CHLORIDE 0.9% FLUSH
3.0000 mL | Freq: Two times a day (BID) | INTRAVENOUS | Status: DC
  Administered 2015-07-28 – 2015-07-31 (×5): 3 mL via INTRAVENOUS

## 2015-07-28 MED ORDER — INSULIN ASPART PROT & ASPART (70-30 MIX) 100 UNIT/ML ~~LOC~~ SUSP
20.0000 [IU] | Freq: Two times a day (BID) | SUBCUTANEOUS | Status: DC
  Administered 2015-07-29 – 2015-08-01 (×6): 20 [IU] via SUBCUTANEOUS
  Filled 2015-07-28: qty 10

## 2015-07-28 MED ORDER — ACETAMINOPHEN 325 MG PO TABS: 650.0000 mg | ORAL_TABLET | Freq: Four times a day (QID) | ORAL | Status: DC | PRN

## 2015-07-28 MED ORDER — CLONIDINE HCL 0.1 MG PO TABS
0.1000 mg | ORAL_TABLET | Freq: Once | ORAL | Status: AC
  Administered 2015-07-28: 0.1 mg via ORAL
  Filled 2015-07-28: qty 1

## 2015-07-28 MED ORDER — BENAZEPRIL HCL 20 MG PO TABS: 20.0000 mg | ORAL_TABLET | Freq: Every day | ORAL | Status: DC

## 2015-07-28 MED ORDER — POTASSIUM CHLORIDE CRYS ER 20 MEQ PO TBCR
20.0000 meq | EXTENDED_RELEASE_TABLET | Freq: Two times a day (BID) | ORAL | Status: DC
  Administered 2015-07-29 – 2015-08-01 (×7): 20 meq via ORAL
  Filled 2015-07-28 (×9): qty 1

## 2015-07-28 MED ORDER — PRAVASTATIN SODIUM 40 MG PO TABS
40.0000 mg | ORAL_TABLET | Freq: Every day | ORAL | Status: DC
  Administered 2015-07-29 – 2015-07-31 (×3): 40 mg via ORAL
  Filled 2015-07-28 (×3): qty 1

## 2015-07-28 MED ORDER — AMLODIPINE BESYLATE 10 MG PO TABS: 10.0000 mg | ORAL_TABLET | Freq: Every day | ORAL | Status: DC

## 2015-07-28 MED ORDER — CARVEDILOL 6.25 MG PO TABS
6.2500 mg | ORAL_TABLET | Freq: Two times a day (BID) | ORAL | Status: DC
  Filled 2015-07-28: qty 1

## 2015-07-28 MED ORDER — MORPHINE SULFATE (PF) 2 MG/ML IV SOLN
2.0000 mg | Freq: Once | INTRAVENOUS | Status: AC
  Administered 2015-07-28: 2 mg via INTRAVENOUS
  Filled 2015-07-28: qty 1

## 2015-07-28 MED ORDER — LORAZEPAM 2 MG/ML IJ SOLN: 2.0000 mg | Freq: Once | INTRAMUSCULAR | Status: DC

## 2015-07-28 MED ORDER — NICARDIPINE HCL IN NACL 20-0.86 MG/200ML-% IV SOLN
3.0000 mg/h | Freq: Once | INTRAVENOUS | Status: AC
  Administered 2015-07-28: 5 mg/h via INTRAVENOUS
  Filled 2015-07-28: qty 200

## 2015-07-28 MED ORDER — GLIMEPIRIDE 2 MG PO TABS
2.0000 mg | ORAL_TABLET | Freq: Every day | ORAL | Status: DC
  Administered 2015-07-29: 2 mg via ORAL
  Filled 2015-07-28 (×2): qty 1

## 2015-07-28 MED ORDER — LABETALOL HCL 5 MG/ML IV SOLN
20.0000 mg | Freq: Once | INTRAVENOUS | Status: AC
  Administered 2015-07-28 – 2015-07-29 (×2): 20 mg via INTRAVENOUS
  Filled 2015-07-28: qty 4

## 2015-07-28 MED ORDER — MAGNESIUM SULFATE 2 GM/50ML IV SOLN
2.0000 g | Freq: Once | INTRAVENOUS | Status: AC
  Administered 2015-07-29: 2 g via INTRAVENOUS
  Filled 2015-07-28: qty 50

## 2015-07-28 MED ORDER — METOPROLOL TARTRATE 5 MG/5ML IV SOLN
5.0000 mg | Freq: Once | INTRAVENOUS | Status: AC
  Administered 2015-07-28: 5 mg via INTRAVENOUS
  Filled 2015-07-28: qty 5

## 2015-07-28 MED ORDER — HYDRALAZINE HCL 20 MG/ML IJ SOLN
20.0000 mg | Freq: Once | INTRAMUSCULAR | Status: AC
  Administered 2015-07-28: 20 mg via INTRAVENOUS
  Filled 2015-07-28: qty 1

## 2015-07-28 MED ORDER — ENOXAPARIN SODIUM 40 MG/0.4ML ~~LOC~~ SOLN
40.0000 mg | Freq: Every day | SUBCUTANEOUS | Status: DC
  Administered 2015-07-29 – 2015-07-31 (×3): 40 mg via SUBCUTANEOUS
  Filled 2015-07-28 (×5): qty 0.4

## 2015-07-28 MED ORDER — ONDANSETRON HCL 4 MG/2ML IJ SOLN
4.0000 mg | Freq: Four times a day (QID) | INTRAMUSCULAR | Status: DC | PRN
  Administered 2015-07-28: 4 mg via INTRAVENOUS
  Filled 2015-07-28: qty 2

## 2015-07-28 MED ORDER — LINAGLIPTIN 5 MG PO TABS
5.0000 mg | ORAL_TABLET | Freq: Every day | ORAL | Status: DC
  Administered 2015-07-29: 5 mg via ORAL
  Filled 2015-07-28: qty 1

## 2015-07-28 NOTE — Progress Notes (Signed)
Patient ID: Stephanie Lloyd, female    DOB: [birth year]  Age: 45 y.o. MRN: [id number]    Subjective:  Subjective HPI Stephanie Lloyd presents with c/o L arm pain yesterday that lasted all day.  She had been cleaning house.  She admits to stopping her meds several months ago.  We had chosen meds on the $4 list at [facility] but she never came back for f/u and said she still can not afford it.  Will try to get [facility] involved to help get her meds etc.  Pt denies headache, chest pain , palpitations today.  No NV.   She has been taking her insulin but has not checked her blood sugars.   Review of Systems  Constitutional: Negative for diaphoresis, appetite change, fatigue and unexpected weight change.  Eyes: Negative for pain, redness and visual disturbance.  Respiratory: Negative for cough, chest tightness, shortness of breath and wheezing.   Cardiovascular: Negative for chest pain, palpitations and leg swelling.  Gastrointestinal: Negative for nausea and vomiting.  Endocrine: Negative for cold intolerance, heat intolerance, polydipsia, polyphagia and polyuria.  Genitourinary: Negative for dysuria, frequency and difficulty urinating.  Neurological: Negative for dizziness, speech difficulty, weakness, light-headedness, numbness and headaches.    History Past Medical History  Diagnosis Date  . Immune deficiency disorder (HCC)   . Diabetes ([facility])   . Diabetes mellitus without complication ([facility])   . Hypertension   . Stroke [facility])     She has no past surgical history on file.   Her family history is not on file.She reports that she has never smoked. She has never used smokeless tobacco. She reports that she does not drink alcohol or use illicit drugs.  No current facility-administered medications on file prior to visit.   Current Outpatient Prescriptions on File Prior to Visit  Medication Sig Dispense Refill  . carvedilol (COREG) 6.25 MG tablet Take 1 tablet (6.25 mg total) by mouth 2 (two) times  daily with a meal. 60 tablet 2  . glimepiride (AMARYL) 2 MG tablet Take 1 tablet (2 mg total) by mouth daily before breakfast. 30 tablet 3  . insulin NPH-regular Human (NOVOLIN 70/30 RELION) (70-30) 100 UNIT/ML injection 20 units SubQ bid 30-45 min before meals 10 mL 11  . Insulin Pen Needle (PEN NEEDLES) 31G X 5 MM MISC Inject 1 Device as directed as directed. Inject 20 units SubQ twice daily 100 each 11  . Insulin Syringe-Needle U-100 (B-D INSULIN SYRINGE 1CC/25G) 25G X 5/8" 1 ML MISC 20 units SubQ bid 30-45 min before meals. Dx E11.9 100 each 11  . lovastatin (MEVACOR) 20 MG tablet Take 2 tablets (40 mg total) by mouth at bedtime. 60 tablet 11  . ONETOUCH DELICA LANCETS FINE MISC Check blood sugar twice a day. Dx:E11.9 100 each prn  . potassium chloride SA (K-DUR,KLOR-CON) 20 MEQ tablet Take 1 tablet (20 mEq total) by mouth 2 (two) times daily. 60 tablet 5  . saxagliptin HCl ([name]) 5 MG TABS tablet Take 1 tablet (5 mg total) by mouth daily. 30 tablet 2  . amLODipine (NORVASC) 10 MG tablet Take 1 tablet (10 mg total) by mouth daily. (Patient not taking: Reported on 07/28/2015) 90 tablet 1  . benazepril (LOTENSIN) 20 MG tablet Take 1 tablet (20 mg total) by mouth daily. (Patient not taking: Reported on 07/28/2015) 90 tablet 3  . glucose blood (ONETOUCH VERIO) test strip Check blood sugar twice daily. Dx: E11.9 (Patient not taking: Reported on 02/16/2015) 100 each prn  .  metoprolol (LOPRESSOR) 50 MG tablet Take 1 tablet (50 mg total) by mouth 2 (two) times daily. (Patient not taking: Reported on 07/28/2015) 60 tablet 2     Objective:  Objective Physical Exam  Constitutional: She is oriented to person, place, and time. She appears well-developed and well-nourished.  HENT:  Head: Normocephalic and atraumatic.  Eyes: Conjunctivae and EOM are normal.  Neck: Normal range of motion. Neck supple. No JVD present. Carotid bruit is not present. No thyromegaly present.  Cardiovascular: Normal rate,  regular rhythm and normal heart sounds.   No murmur heard. Pulmonary/Chest: Effort normal and breath sounds normal. No respiratory distress. She has no wheezes. She has no rales. She exhibits no tenderness.  Musculoskeletal: She exhibits no edema.  Neurological: She is alert and oriented to person, place, and time. She has normal strength.  Psychiatric: She has a normal mood and affect.   BP 218/120 mmHg  Pulse 76  Temp(Src) 97.8 F (36.6 C) (Oral)  Wt 193 lb 9.6 oz (87.816 kg)  SpO2 98% Wt Readings from Last 3 Encounters:  07/29/15 188 lb 15 oz (85.7 kg)  07/28/15 193 lb 9.6 oz (87.816 kg)  02/16/15 190 lb (86.183 kg)     Lab Results  Component Value Date   WBC 9.7 07/28/2015   HGB 14.5 07/28/2015   HCT 43.7 07/28/2015   PLT 169 07/28/2015   GLUCOSE 293* 07/29/2015   CHOL 255* 05/23/2015   TRIG 110.0 05/23/2015   HDL 68.80 05/23/2015   LDLCALC 164* 05/23/2015   ALT 15 07/28/2015   AST 20 07/28/2015   NA 138 07/29/2015   K 3.4* 07/29/2015   CL 104 07/29/2015   CREATININE 1.06* 07/29/2015   BUN 12 07/29/2015   CO2 22 07/29/2015   TSH 0.548 06/23/2014   INR 1.12 09/27/2014   HGBA1C 8.4* 05/23/2015    Ct Head Wo Contrast  09/27/2014  CLINICAL DATA:  Slurred speech for 5 days. History of hemorrhagic CVA. Initial encounter. EXAM: CT HEAD WITHOUT CONTRAST TECHNIQUE: Contiguous axial images were obtained from the base of the skull through the vertex without intravenous contrast. COMPARISON:  06/23/2014 head CT. FINDINGS: The right thalamic hematoma has resolved without significant sequela. There are chronic small vessel ischemic changes within the periventricular white matter and basal ganglia, asymmetric to the right. There is no evidence of acute intracranial hemorrhage, mass lesion, brain edema or extra-axial fluid collection. The ventricles and subarachnoid spaces are prominent but stable. There is no CT evidence of acute cortical infarction. The visualized paranasal  sinuses, mastoid air cells and middle ears are clear. The calvarium is intact. IMPRESSION: No acute findings or explanation for the patient's symptoms. Interval resolution of previously demonstrated right thalamic hemorrhage. Electronically Signed   By: Carey BullocksWilliam  Veazey M.D.   On: 09/27/2014 12:30     Assessment & Plan:  Plan I am having Ms. Ponce maintain her glucose blood, amLODipine, benazepril, potassium chloride SA, metoprolol, carvedilol, saxagliptin HCl, lovastatin, insulin NPH-regular Human, Pen Needles, Insulin Syringe-Needle U-100, ONETOUCH DELICA LANCETS FINE, and glimepiride.  No orders of the defined types were placed in this encounter.   ekg--  Inverted t waves laterally-- LVH  Problem List Items Addressed This Visit      Unprioritized   Hypertension - Primary   Relevant Orders   EKG 12-Lead (Completed)   Hypertensive urgency    Pt transferred to ER after d/w ER dr for further evaluation meds to be restarted  [facility]  [facility] with [facility] etc        Type 2 diabetes mellitus with other circulatory complications (HCC)    Pt states she is taking her insulin but not checking her blood glucose         Follow-up: No Follow-up on file.  Donato SchultzYvonne R [name], DO

## 2015-07-28 NOTE — ED Notes (Signed)
MD at bedside. 

## 2015-07-28 NOTE — ED Provider Notes (Signed)
CSN: [id number]     Arrival date & time 07/28/15  1715 History   First MD Initiated Contact with Patient 07/28/15 1725     Chief Complaint  Patient presents with  . Arm Pain  PT IS A 45 YO BF WITH A HX OF AN ICH LAST YEAR DUE TO UNCONTROLLED HTN.  THE PT RAN OUT OF HER MEDS SEVERAL MONTHS AGO AND HAS NOT GOTTEN A NEW RX.  SHE CALLED HER DR TODAY B/C SHE WAS HAVING LEFT ARM.   THEY SAW PT TODAY, AND DUE TO HER SX AND BP, THEY SENT HER HERE.  PT DENIES ANY ARM OR CP TODAY.  PT DENIES ANY NEW NEUROLOGIC SX.  PT HAS NO IDEA WHAT HER BS HAS BEEN RUNNING.   (Consider location/radiation/quality/duration/timing/severity/associated sxs/prior Treatment) Patient is a 45 y.o. female presenting with arm pain. The history is provided by the patient.  Arm Pain This is a new problem.    Past Medical History  Diagnosis Date  . Immune deficiency disorder (HCC)   . Diabetes (HCC)   . Diabetes mellitus without complication (HCC)   . Hypertension   . Stroke [facility](HCC)    History reviewed. No pertinent past surgical history. No family history on file. Social History  Substance Use Topics  . Smoking status: Never Smoker   . Smokeless tobacco: Never Used  . Alcohol Use: No     Comment: occ   OB History    Gravida Para Term Preterm AB TAB SAB Ectopic Multiple Living   0 0 0 0 0 0 0 0       Review of Systems  Musculoskeletal:       LEFT ARM PAIN  All other systems reviewed and are negative.     Allergies  Metformin and related  Home Medications   Prior to Admission medications   Medication Sig Start Date End Date Taking? Authorizing Provider  amLODipine (NORVASC) 10 MG tablet Take 1 tablet (10 mg total) by mouth daily. Patient not taking: Reported on 07/28/2015 02/16/15   Grayling CongressYvonne R [name], DO  benazepril (LOTENSIN) 20 MG tablet Take 1 tablet (20 mg total) by mouth daily. Patient not taking: Reported on 07/28/2015 02/16/15   Lelon PerlaYvonne R [name], DO  carvedilol (COREG) 6.25 MG tablet Take 1  tablet (6.25 mg total) by mouth 2 (two) times daily with a meal. 02/16/15   Lelon PerlaYvonne R [name], DO  glimepiride (AMARYL) 2 MG tablet Take 1 tablet (2 mg total) by mouth daily before breakfast. 05/31/15   Lelon PerlaYvonne R [name], DO  glucose blood (ONETOUCH VERIO) test strip Check blood sugar twice daily. Dx: E11.9 Patient not taking: Reported on 02/16/2015 06/30/14   Lelon PerlaYvonne R [name], DO  insulin NPH-regular Human (NOVOLIN 70/30 RELION) (70-30) 100 UNIT/ML injection 20 units SubQ bid 30-45 min before meals 03/10/15   [name], DO  Insulin Pen Needle (PEN NEEDLES) 31G X 5 MM MISC Inject 1 Device as directed as directed. Inject 20 units SubQ twice daily 03/10/15   Lelon PerlaYvonne R [name], DO  Insulin Syringe-Needle U-100 (B-D INSULIN SYRINGE 1CC/25G) 25G X 5/8" 1 ML MISC 20 units SubQ bid 30-45 min before meals. Dx E11.9 03/30/15   Lelon PerlaYvonne R [name], DO  lovastatin (MEVACOR) 20 MG tablet Take 2 tablets (40 mg total) by mouth at bedtime. 02/16/15   Lelon PerlaYvonne R [name], DO  metoprolol (LOPRESSOR) 50 MG tablet Take 1 tablet (50 mg total) by mouth 2 (two) times  daily. Patient not taking: Reported on 07/28/2015 02/16/15   Grayling Congress [name], DO  ONETOUCH DELICA LANCETS FINE MISC Check blood sugar twice a day. Dx:E11.9 04/14/15   Lelon Perla Chase, DO  potassium chloride SA (K-DUR,KLOR-CON) 20 MEQ tablet Take 1 tablet (20 mEq total) by mouth 2 (two) times daily. 02/16/15   Lelon Perla Chase, DO  saxagliptin HCl ([name]) 5 MG TABS tablet Take 1 tablet (5 mg total) by mouth daily. 02/16/15   [name], DO   BP 226/105 mmHg  Pulse 89  Temp(Src) 97.8 F (36.6 C) (Oral)  Resp 15  Ht  (1.778 m)  Wt 193 lb (87.544 kg)  BMI 27.69 kg/m2  SpO2 100% Physical Exam  Constitutional: She is oriented to person, place, and time. She appears well-developed and well-nourished.  HENT:  Head: Normocephalic and atraumatic.  Right Ear: External ear normal.  Left Ear: External ear normal.   Nose: Nose normal.  Mouth/Throat: Oropharynx is clear and moist.  Eyes: Conjunctivae and EOM are normal. Pupils are equal, round, and reactive to light.  Neck: Normal range of motion. Neck supple.  Cardiovascular: Normal rate, regular rhythm, normal heart sounds and intact distal pulses.   Pulmonary/Chest: Effort normal and breath sounds normal.  Abdominal: Soft. Bowel sounds are normal.  Musculoskeletal: Normal range of motion.  Neurological: She is alert and oriented to person, place, and time.  Skin: Skin is warm and dry.  Psychiatric: She has a normal mood and affect. Her behavior is normal. Judgment and thought content normal.  Nursing note and vitals reviewed.   ED Course  Procedures (including critical care time) Labs Review Labs Reviewed  COMPREHENSIVE METABOLIC PANEL - Abnormal; Notable for the following:    Potassium 3.4 (*)    Glucose, Bld 213 (*)    Creatinine, Ser 1.21 (*)    Calcium 8.1 (*)    Total Protein 6.4 (*)    GFR calc non Af Amer 52 (*)    All other components within normal limits  CBG MONITORING, ED - Abnormal; Notable for the following:    Glucose-Capillary 302 (*)    All other components within normal limits  CBC  TROPONIN I  MAGNESIUM  URINALYSIS, ROUTINE W REFLEX MICROSCOPIC (NOT AT [facility])    Imaging Review Dg Chest 2 View  07/28/2015  CLINICAL DATA:  Left arm pain. EXAM: CHEST  2 VIEW COMPARISON:  None. FINDINGS: Blunting of the left costophrenic angle on the lateral view suggests small effusion. No other abnormalities. IMPRESSION: Suggested tiny left pleural effusion on the lateral view. No other abnormalities. Electronically Signed   By: Gerome Sam III M.D   On: 07/28/2015 18:22   Ct Head Wo Contrast  07/28/2015  CLINICAL DATA:  Slurred speech.  Diabetes and hypertension EXAM: CT HEAD WITHOUT CONTRAST TECHNIQUE: Contiguous axial images were obtained from the base of the skull through the vertex without intravenous contrast. COMPARISON:  CT  head 09/27/2014.  MRI 10/19/2014 FINDINGS: Mild atrophy for age. Negative for hydrocephalus. Low density throughout the cerebral white matter bilaterally unchanged and consistent with chronic ischemia. Small chronic lacunar infarct in the left caudate. Chronic infarct right thalamus. No acute infarct 5 mm density right posterior frontal cortex most likely an area of chronic hemorrhage. Multiple areas of chronic hemorrhage in the brain based on MRI. Negative for acute hemorrhage.  Negative for acute infarct or mass. Negative calvarium. IMPRESSION: Atrophy and chronic microvascular ischemia. Multiple areas of chronic hemorrhage in  the brain based on prior MRI. No acute abnormality. Electronically Signed   By: Marlan Palauharles  Clark M.D.   On: 07/28/2015 18:21   I have personally reviewed and evaluated these images and lab results as part of my medical decision-making.   EKG Interpretation   Date/Time:  Friday July 28 2015 17:33:53 EDT Ventricular Rate:  71 PR Interval:    QRS Duration: 83 QT Interval:  395 QTC Calculation: 430 R Axis:   0 Text Interpretation:  Sinus rhythm Left atrial enlargement LVH with  secondary repolarization abnormality unchanged from prior Confirmed by  [facility] MD, Nhat Hearne ([id number]) on 07/28/2015 5:39:15 PM      MDM  Pt's BP is not budging with 20 mg labetalol, 5 mg lopressor, 20 mg hydralazine.  Because pt had an ICH with severely elevated bp last year, I will speak to the hospitalist for admission.  Pt d/w Dr. Robb Matarrtiz ([facility]) who will admit pt. Final diagnoses:  Malignant hypertension  Noncompliance with medication regimen  Poorly controlled type 2 diabetes mellitus (HCC)       Jacalyn LefevreJulie Jakiera Ehler, MD 07/28/15 2021

## 2015-07-28 NOTE — ED Notes (Signed)
IV Cardene stopped per verbal order of EDP. The patient is aware of admission

## 2015-07-28 NOTE — ED Notes (Addendum)
States she was having pain in her left arm yesterday. She went to see her MD today but was not having arm pain today. She was sent from Dr Ernst SpellLowne's office due to HTN. Pt has slurred speech but states it is unchanged from her stroke a year ago. She stopped taking all her medications months ago.

## 2015-07-28 NOTE — ED Notes (Signed)
Pt reports she ran out of her bp meds several months ago and has been unable to afford them. Pt denies chest pain, SOB.

## 2015-07-28 NOTE — Progress Notes (Deleted)
Patient ID: Stephanie ShirtsMarva N Early, female   DOB: 06/16/1970, 45 y.o.   MRN: [id number] Accepted to telemetry bed for hypertensive urgency management.  Please call the floor manager at extension [phone], once the patient arrives to the unit, for admitting physician assignment.   45 year old with a past medical history hypertension, obesity type 2 diabetes, CVA who is currently being [facility] due to left arm pain yesterday and on uncontrolled hypertension today.    Component Value Units    Urine microscopic-add on [[id number]][[id number]] (Abnormal) Collected: 07/28/15 1905   Updated: 07/28/15 1954     Squamous Epithelial / LPF 6-30 (A)    WBC, UA 0-5 WBC/hpf    RBC / HPF 0-5 RBC/hpf    Bacteria, UA RARE (A)    Urine-Other MUCOUS PRESENT   Urinalysis, Routine w reflex microscopic (not at [facility]) [[id number]][[id number]] (Abnormal) Collected: 07/28/15 1905   Updated: 07/28/15 1954    Specimen Type: Urine    Specimen Source: Urine, Clean Catch     Color, Urine YELLOW    APPearance CLOUDY (A)    Specific Gravity, Urine 1.010    pH 5.5    Glucose, UA 250 (A) mg/dL    Hgb urine dipstick NEGATIVE    Bilirubin Urine NEGATIVE    Ketones, ur NEGATIVE mg/dL    Protein, ur NEGATIVE mg/dL    Nitrite NEGATIVE    Leukocytes, UA TRACE (A)   Comprehensive metabolic panel [[id number]][[id number]] (Abnormal) Collected: 07/28/15 1850   Updated: 07/28/15 1921    Specimen Type: Blood    Specimen Source: Vein     Sodium 136 mmol/L    Potassium 3.4 (L) mmol/L    Chloride 107 mmol/L    CO2 22 mmol/L    Glucose, Bld 213 (H) mg/dL    BUN 17 mg/dL    Creatinine, Ser 8.411.21 (H) mg/dL    Calcium 8.1 (L) mg/dL    Total Protein 6.4 (L) g/dL    Albumin 3.6 g/dL    AST 20 U/L    ALT 15 U/L    Alkaline Phosphatase 89 U/L    Total Bilirubin 0.4 mg/dL    GFR calc non Af Amer 52 (L) mL/min    GFR calc Af Amer >60 mL/min    Anion gap 7   Magnesium [[id number]][[id number]] Collected: 07/28/15 1850    Updated: 07/28/15 1921    Specimen Type: Blood    Specimen Source: Vein     Magnesium 1.8 mg/dL   Troponin I [[id number]][[id number]] Collected: 07/28/15 1744   Updated: 07/28/15 1830    Specimen Type: Blood    Specimen Source: Vein     Troponin I <0.03 ng/mL   CBC [[id number]][[id number]] Collected: 07/28/15 1744   Updated: 07/28/15 1807    Specimen Type: Blood    Specimen Source: Vein     WBC 8.7 K/uL    RBC 4.99 MIL/uL    Hemoglobin 13.3 g/dL    HCT 87.540.0 %    MCV 64.380.2 fL    MCH 26.7 pg    MCHC 33.3 g/dL    RDW 32.913.5 %    Platelets 173 K/uL   CBG monitoring, ED [[id number]][[id number]] (Abnormal) Collected: 07/28/15 1738   Updated: 07/28/15 1748     Glucose-Capillary 302 (H) mg/dL     Vent. rate 71 BPM PR interval * ms QRS duration 83 ms QT/QTc 395/430 ms P-R-T axes 50 0 127 Sinus rhythm Left atrial enlargement LVH with secondary repolarization abnormality   CHEST 2 VIEW  COMPARISON: None.  FINDINGS: Blunting of the left costophrenic angle on the lateral view suggests small effusion. No other abnormalities.  IMPRESSION: Suggested tiny left pleural effusion on the lateral view. No other abnormalities.   Electronically Signed  By: Gerome Samavid Williams III M.D  On: 07/28/2015 18:22  CT head without contrast  IMPRESSION: Atrophy and chronic microvascular ischemia. Multiple areas of chronic hemorrhage in the brain based on prior MRI.  No acute abnormality.   Electronically Signed  By: Marlan Palauharles Clark M.D.  On: 07/28/2015 18:21  Sanda Kleinavid Quin Mcpherson, M.D.

## 2015-07-28 NOTE — Progress Notes (Signed)
Pre visit review using our clinic review tool, if applicable. No additional management support is needed unless otherwise documented below in the visit note. 

## 2015-07-29 DIAGNOSIS — E785 Hyperlipidemia, unspecified: Secondary | ICD-10-CM | POA: Diagnosis present

## 2015-07-29 DIAGNOSIS — N182 Chronic kidney disease, stage 2 (mild): Secondary | ICD-10-CM | POA: Diagnosis not present

## 2015-07-29 DIAGNOSIS — Z8673 Personal history of transient ischemic attack (TIA), and cerebral infarction without residual deficits: Secondary | ICD-10-CM | POA: Diagnosis not present

## 2015-07-29 DIAGNOSIS — I619 Nontraumatic intracerebral hemorrhage, unspecified: Secondary | ICD-10-CM | POA: Diagnosis not present

## 2015-07-29 DIAGNOSIS — Z6827 Body mass index (BMI) 27.0-27.9, adult: Secondary | ICD-10-CM | POA: Diagnosis not present

## 2015-07-29 DIAGNOSIS — Z7984 Long term (current) use of oral hypoglycemic drugs: Secondary | ICD-10-CM | POA: Diagnosis not present

## 2015-07-29 DIAGNOSIS — Z794 Long term (current) use of insulin: Secondary | ICD-10-CM | POA: Diagnosis not present

## 2015-07-29 DIAGNOSIS — E1159 Type 2 diabetes mellitus with other circulatory complications: Secondary | ICD-10-CM | POA: Diagnosis not present

## 2015-07-29 DIAGNOSIS — I161 Hypertensive emergency: Secondary | ICD-10-CM | POA: Diagnosis present

## 2015-07-29 DIAGNOSIS — N183 Chronic kidney disease, stage 3 (moderate): Secondary | ICD-10-CM | POA: Diagnosis present

## 2015-07-29 DIAGNOSIS — E1165 Type 2 diabetes mellitus with hyperglycemia: Secondary | ICD-10-CM | POA: Diagnosis present

## 2015-07-29 DIAGNOSIS — I10 Essential (primary) hypertension: Secondary | ICD-10-CM | POA: Diagnosis not present

## 2015-07-29 DIAGNOSIS — I129 Hypertensive chronic kidney disease with stage 1 through stage 4 chronic kidney disease, or unspecified chronic kidney disease: Secondary | ICD-10-CM | POA: Diagnosis present

## 2015-07-29 DIAGNOSIS — Z79899 Other long term (current) drug therapy: Secondary | ICD-10-CM | POA: Diagnosis not present

## 2015-07-29 DIAGNOSIS — Z9114 Patient's other noncompliance with medication regimen: Secondary | ICD-10-CM | POA: Diagnosis not present

## 2015-07-29 DIAGNOSIS — E876 Hypokalemia: Secondary | ICD-10-CM | POA: Diagnosis present

## 2015-07-29 DIAGNOSIS — E1122 Type 2 diabetes mellitus with diabetic chronic kidney disease: Secondary | ICD-10-CM | POA: Diagnosis present

## 2015-07-29 DIAGNOSIS — E669 Obesity, unspecified: Secondary | ICD-10-CM | POA: Diagnosis present

## 2015-07-29 DIAGNOSIS — Z888 Allergy status to other drugs, medicaments and biological substances status: Secondary | ICD-10-CM | POA: Diagnosis not present

## 2015-07-29 DIAGNOSIS — D849 Immunodeficiency, unspecified: Secondary | ICD-10-CM | POA: Diagnosis present

## 2015-07-29 DIAGNOSIS — M79602 Pain in left arm: Secondary | ICD-10-CM | POA: Diagnosis present

## 2015-07-29 LAB — BASIC METABOLIC PANEL
Anion gap: 12 (ref 5–15)
BUN: 12 mg/dL (ref 6–20)
CHLORIDE: 104 mmol/L (ref 101–111)
CO2: 22 mmol/L (ref 22–32)
Calcium: 9.2 mg/dL (ref 8.9–10.3)
Creatinine, Ser: 1.06 mg/dL — ABNORMAL HIGH (ref 0.44–1.00)
GFR calc Af Amer: >60 mL/min (ref >60)
GFR calc non Af Amer: >60 mL/min (ref >60)
GLUCOSE: 293 mg/dL — AB (ref 65–99)
POTASSIUM: 3.4 mmol/L — AB (ref 3.5–5.1)
Sodium: 138 mmol/L (ref 135–145)

## 2015-07-29 LAB — GLUCOSE, CAPILLARY
GLUCOSE-CAPILLARY: 108 mg/dL — AB (ref 65–99)
GLUCOSE-CAPILLARY: 44 mg/dL — AB (ref 65–99)
GLUCOSE-CAPILLARY: 53 mg/dL — AB (ref 65–99)
Glucose-Capillary: 288 mg/dL — ABNORMAL HIGH (ref 65–99)
Glucose-Capillary: 149 mg/dL — ABNORMAL HIGH (ref 65–99)
Glucose-Capillary: 45 mg/dL — ABNORMAL LOW (ref 65–99)
Glucose-Capillary: 45 mg/dL — ABNORMAL LOW (ref 65–99)

## 2015-07-29 LAB — CREATININE, SERUM
CREATININE: 0.97 mg/dL (ref 0.44–1.00)
GFR calc Af Amer: >60 mL/min (ref >60)

## 2015-07-29 LAB — TROPONIN I
Troponin I: <0.03 ng/mL (ref <0.03)
Troponin I: 0.03 ng/mL (ref <0.03)

## 2015-07-29 LAB — CBC
HCT: 43.7 % (ref 36.0–46.0)
Hemoglobin: 14.5 g/dL (ref 12.0–15.0)
MCH: 27 pg (ref 26.0–34.0)
MCHC: 33.2 g/dL (ref 30.0–36.0)
MCV: 81.4 fL (ref 78.0–100.0)
Platelets: 169 10*3/uL (ref 150–400)
RBC: 5.37 MIL/uL — ABNORMAL HIGH (ref 3.87–5.11)
RDW: 13.5 % (ref 11.5–15.5)
WBC: 9.7 10*3/uL (ref 4.0–10.5)

## 2015-07-29 LAB — MRSA PCR SCREENING: MRSA by PCR: NEGATIVE

## 2015-07-29 LAB — MAGNESIUM: MAGNESIUM: 2.7 mg/dL — AB (ref 1.7–2.4)

## 2015-07-29 LAB — PHOSPHORUS: Phosphorus: 3 mg/dL (ref 2.5–4.6)

## 2015-07-29 MED ORDER — LABETALOL HCL 5 MG/ML IV SOLN
INTRAVENOUS | Status: AC
  Administered 2015-07-29: 20 mg via INTRAVENOUS
  Filled 2015-07-29: qty 4

## 2015-07-29 MED ORDER — CLONIDINE HCL 0.1 MG PO TABS
0.1000 mg | ORAL_TABLET | Freq: Once | ORAL | Status: AC
  Administered 2015-07-29: 0.1 mg via ORAL
  Filled 2015-07-29: qty 1

## 2015-07-29 MED ORDER — CARVEDILOL 6.25 MG PO TABS
6.2500 mg | ORAL_TABLET | Freq: Two times a day (BID) | ORAL | Status: DC
  Administered 2015-07-29 – 2015-08-01 (×7): 6.25 mg via ORAL
  Filled 2015-07-29 (×8): qty 1

## 2015-07-29 MED ORDER — INSULIN ASPART 100 UNIT/ML ~~LOC~~ SOLN
0.0000 [IU] | Freq: Three times a day (TID) | SUBCUTANEOUS | Status: DC
  Administered 2015-07-29: 2 [IU] via SUBCUTANEOUS
  Administered 2015-07-30: 5 [IU] via SUBCUTANEOUS
  Administered 2015-07-30 – 2015-07-31 (×4): 2 [IU] via SUBCUTANEOUS

## 2015-07-29 MED ORDER — AMLODIPINE BESYLATE 10 MG PO TABS
10.0000 mg | ORAL_TABLET | Freq: Once | ORAL | Status: AC
  Administered 2015-07-30: 10 mg via ORAL
  Filled 2015-07-29: qty 1

## 2015-07-29 MED ORDER — AMLODIPINE BESYLATE 10 MG PO TABS
10.0000 mg | ORAL_TABLET | Freq: Every day | ORAL | Status: DC
  Administered 2015-07-30 – 2015-08-01 (×3): 10 mg via ORAL
  Filled 2015-07-29 (×4): qty 1

## 2015-07-29 MED ORDER — DEXTROSE-NACL 5-0.9 % IV SOLN
Freq: Once | INTRAVENOUS | Status: AC
  Administered 2015-07-29: via INTRAVENOUS

## 2015-07-29 MED ORDER — HEPARIN SODIUM (PORCINE) 5000 UNIT/ML IJ SOLN: 5000.0000 [IU] | Freq: Three times a day (TID) | INTRAMUSCULAR | Status: DC

## 2015-07-29 MED ORDER — BENAZEPRIL HCL 20 MG PO TABS
20.0000 mg | ORAL_TABLET | Freq: Every day | ORAL | Status: DC
  Administered 2015-07-30 – 2015-08-01 (×3): 20 mg via ORAL
  Filled 2015-07-29 (×4): qty 1

## 2015-07-29 MED ORDER — DEXTROSE 50 % IV SOLN
INTRAVENOUS | Status: AC
  Administered 2015-07-29: 25 mL
  Administered 2015-07-30: 50 mL
  Filled 2015-07-29: qty 50

## 2015-07-29 MED ORDER — NICARDIPINE HCL IN NACL 20-0.86 MG/200ML-% IV SOLN
3.0000 mg/h | INTRAVENOUS | Status: DC
  Administered 2015-07-29: 3 mg/h via INTRAVENOUS
  Administered 2015-07-29: 5 mg/h via INTRAVENOUS
  Filled 2015-07-29 (×2): qty 200

## 2015-07-29 MED ORDER — SODIUM CHLORIDE 0.9 % IV SOLN: 250.0000 mL | INTRAVENOUS | Status: DC | PRN

## 2015-07-29 MED ORDER — GLUCOSE 40 % PO GEL
ORAL | Status: AC
  Administered 2015-07-29: 37.5 g
  Filled 2015-07-29: qty 1

## 2015-07-29 MED ORDER — LABETALOL HCL 5 MG/ML IV SOLN
20.0000 mg | Freq: Once | INTRAVENOUS | Status: DC
  Filled 2015-07-29: qty 4

## 2015-07-29 NOTE — Progress Notes (Signed)
Patient ID: Manfred ShirtsMarva N Kall, female   DOB: 01/25/1971, 45 y.o.   MRN: [id number] Similar note deleted by miss click while trying to enter addendum.   Accepted to telemetry bed for hypertensive urgency management.  Please call the floor manager at extension [phone], once the patient arrives to the unit, for admitting physician assignment.   45 year old with a past medical history hypertension, obesity type 2 diabetes, CVA who is currently being [facility] due to left arm pain yesterday and on uncontrolled hypertension today.    Component Value Units    Urine microscopic-add on [[id number]][[id number]] (Abnormal) Collected: 07/28/15 1905   Updated: 07/28/15 1954     Squamous Epithelial / LPF 6-30 (A)    WBC, UA 0-5 WBC/hpf    RBC / HPF 0-5 RBC/hpf    Bacteria, UA RARE (A)    Urine-Other MUCOUS PRESENT   Urinalysis, Routine w reflex microscopic (not at [facility]) [[id number]][[id number]] (Abnormal) Collected: 07/28/15 1905   Updated: 07/28/15 1954    Specimen Type: Urine    Specimen Source: Urine, Clean Catch     Color, Urine YELLOW    APPearance CLOUDY (A)    Specific Gravity, Urine 1.010    pH 5.5    Glucose, UA 250 (A) mg/dL    Hgb urine dipstick NEGATIVE    Bilirubin Urine NEGATIVE    Ketones, ur NEGATIVE mg/dL    Protein, ur NEGATIVE mg/dL    Nitrite NEGATIVE    Leukocytes, UA TRACE (A)   Comprehensive metabolic panel [[id number]][[id number]] (Abnormal) Collected: 07/28/15 1850   Updated: 07/28/15 1921    Specimen Type: Blood    Specimen Source: Vein     Sodium 136 mmol/L    Potassium 3.4 (L) mmol/L    Chloride 107 mmol/L    CO2 22 mmol/L    Glucose, Bld 213 (H) mg/dL    BUN 17 mg/dL    Creatinine, Ser 8.411.21 (H) mg/dL    Calcium 8.1 (L) mg/dL    Total Protein 6.4 (L) g/dL    Albumin 3.6 g/dL    AST 20 U/L    ALT 15 U/L    Alkaline Phosphatase 89 U/L    Total Bilirubin 0.4 mg/dL    GFR calc non Af Amer 52 (L) mL/min    GFR calc Af Amer >60 mL/min     Anion gap 7   Magnesium [[id number]][[id number]] Collected: 07/28/15 1850   Updated: 07/28/15 1921    Specimen Type: Blood    Specimen Source: Vein     Magnesium 1.8 mg/dL   Troponin I [[id number]][[id number]] Collected: 07/28/15 1744   Updated: 07/28/15 1830    Specimen Type: Blood    Specimen Source: Vein     Troponin I <0.03 ng/mL   CBC [[id number]][[id number]] Collected: 07/28/15 1744   Updated: 07/28/15 1807    Specimen Type: Blood    Specimen Source: Vein     WBC 8.7 K/uL    RBC 4.99 MIL/uL    Hemoglobin 13.3 g/dL    HCT 87.540.0 %    MCV 64.380.2 fL    MCH 26.7 pg    MCHC 33.3 g/dL    RDW 32.913.5 %    Platelets 173 K/uL   CBG monitoring, ED [[id number]][[id number]] (Abnormal) Collected: 07/28/15 1738   Updated: 07/28/15 1748     Glucose-Capillary 302 (H) mg/dL     Vent. rate 71 BPM PR interval * ms QRS duration 83 ms QT/QTc 395/430 ms P-R-T axes 50 0 127 Sinus rhythm Left atrial enlargement  LVH with secondary repolarization abnormality   CHEST 2 VIEW  COMPARISON: None.  FINDINGS: Blunting of the left costophrenic angle on the lateral view suggests small effusion. No other abnormalities.  IMPRESSION: Suggested tiny left pleural effusion on the lateral view. No other abnormalities.   Electronically Signed  By: Gerome Samavid Williams III M.D  On: 07/28/2015 18:22  CT head without contrast  IMPRESSION: Atrophy and chronic microvascular ischemia. Multiple areas of chronic hemorrhage in the brain based on prior MRI.  No acute abnormality.   Electronically Signed  By: Marlan Palauharles Clark M.D.  On: 07/28/2015 18:21  Sanda Kleinavid Ortiz, M.D.   The patient was subsequently transferred to the ICU due to showing signs of hypertensive  Encephalopathy. She did not showed any new neuro deficits on exam. She was having a headache with multiple episodes of nausea and emesis with poor response to labetalol IVP.

## 2015-07-29 NOTE — Assessment & Plan Note (Deleted)
Uncontrolled Pt noncompliant with meds No arm/ chest pain today but d/w ER-- pt transferred to ER for further evaluation

## 2015-07-29 NOTE — Progress Notes (Signed)
RN [facility], [facility] alerted receiving RN that pt is asymptomatic but has been experiencing elevated blood pressures, however the pt was given several b[facility]p medications [facility] that have been unsuccessful to control the pt's b/p, however pt was started on IV Cardene drip shortly, [facility] RN reported to receiving RN that MD ordered for drip to be stopped because the pt did not need to go to stepdown/ICU unit and that the pt would be started on b/p medication on arrival to unit, RN now waiting for pt's arrival.    Mila PalmerJohnson,Romelle Reiley A, RN 07/28/15

## 2015-07-29 NOTE — Assessment & Plan Note (Signed)
Pt transferred to ER after d/w ER dr for further evaluation meds to be restarted  [facility] etc

## 2015-07-29 NOTE — H&P (Signed)
PULMONARY / CRITICAL CARE MEDICINE   Name: Stephanie Lloyd MRN: [id number] DOB: October 21, 1970    ADMISSION DATE:  07/28/2015  CHIEF COMPLAINT:  Headache  HISTORY OF PRESENT ILLNESS:   Stephanie Lloyd is a 45 y/o woman with a history of severe uncontrolled HTN and DM, as well as prior ICH related HTN who presented to her PCP's office today for L arm pain, and given her HTN (SBP >>200) she was directed to the ED. She was treated with a cardine gtt with improvement, and was accepted to the hospitalist service with the plan to restart her home meds. The gtt was stopped and she was transferred. However, her BP again rose to SBP around 230, and she had headache, nausea, and vomited, so PCCM was called for admission to restart cardeine gtt.  PAST MEDICAL HISTORY :  She  has a past medical history of Immune deficiency disorder (HCC); Diabetes (HCC); Diabetes mellitus without complication (HCC); Hypertension; and Stroke [facility]  PAST SURGICAL HISTORY: She  has no past surgical history on file.  Allergies  Allergen Reactions  . Metformin And Related Diarrhea    No current facility-administered medications on file prior to encounter.   Current Outpatient Prescriptions on File Prior to Encounter  Medication Sig  . amLODipine (NORVASC) 10 MG tablet Take 1 tablet (10 mg total) by mouth daily. (Patient not taking: Reported on 07/28/2015)  . benazepril (LOTENSIN) 20 MG tablet Take 1 tablet (20 mg total) by mouth daily. (Patient not taking: Reported on 07/28/2015)  . carvedilol (COREG) 6.25 MG tablet Take 1 tablet (6.25 mg total) by mouth 2 (two) times daily with a meal.  . glimepiride (AMARYL) 2 MG tablet Take 1 tablet (2 mg total) by mouth daily before breakfast.  . glucose blood (ONETOUCH VERIO) test strip Check blood sugar twice daily. Dx: E11.9 (Patient not taking: Reported on 02/16/2015)  . insulin NPH-regular Human (NOVOLIN 70/30 RELION) (70-30) 100 UNIT/ML injection 20 units SubQ bid 30-45 min before  meals  . Insulin Pen Needle (PEN NEEDLES) 31G X 5 MM MISC Inject 1 Device as directed as directed. Inject 20 units SubQ twice daily  . Insulin Syringe-Needle U-100 (B-D INSULIN SYRINGE 1CC/25G) 25G X 5/8" 1 ML MISC 20 units SubQ bid 30-45 min before meals. Dx E11.9  . lovastatin (MEVACOR) 20 MG tablet Take 2 tablets (40 mg total) by mouth at bedtime.  . metoprolol (LOPRESSOR) 50 MG tablet Take 1 tablet (50 mg total) by mouth 2 (two) times daily. (Patient not taking: Reported on 07/28/2015)  . ONETOUCH DELICA LANCETS FINE MISC Check blood sugar twice a day. Dx:E11.9  . potassium chloride SA (K-DUR,KLOR-CON) 20 MEQ tablet Take 1 tablet (20 mEq total) by mouth 2 (two) times daily.  . saxagliptin HCl ([name]) 5 MG TABS tablet Take 1 tablet (5 mg total) by mouth daily.    FAMILY HISTORY:  Her has no family status information on file.   SOCIAL HISTORY: She  reports that she has never smoked. She has never used smokeless tobacco. She reports that she does not drink alcohol or use illicit drugs.  REVIEW OF SYSTEMS:   HA, nausea, vomiting. Improved with BP control (labetaolol IV)  VITAL SIGNS: BP 187/106 mmHg  Pulse 80  Temp(Src) 97.5 F (36.4 C) (Oral)  Resp 18  Ht  (1.778 m)  Wt 178 lb 3.2 oz (80.831 kg)  BMI 25.57 kg/m2  SpO2 98%  HEMODYNAMICS:    VENTILATOR SETTINGS:    INTAKE / OUTPUT:  PHYSICAL EXAMINATION: General:  Middle aged woman in NAD Neuro:  Sleepy, but oriented x4 HEENT:  MMM Cardiovascular:  S2>S1, RRR Lungs:  CTAB Abdomen:  Nontender Musculoskeletal:  No derfomities Skin:  No rashes  LABS:  BMET  Recent Labs Lab 07/28/15 1850  NA 136  K 3.4*  CL 107  CO2 22  BUN 17  CREATININE 1.21*  GLUCOSE 213*    Electrolytes  Recent Labs Lab 07/28/15 1850  CALCIUM 8.1*  MG 1.8    CBC  Recent Labs Lab 07/28/15 1744 07/28/15 2345  WBC 8.7 9.7  HGB 13.3 14.5  HCT 40.0 43.7  PLT 173 169    Coag's No results for input(s): APTT, INR  in the last 168 hours.  Sepsis Markers No results for input(s): LATICACIDVEN, PROCALCITON, O2SATVEN in the last 168 hours.  ABG No results for input(s): PHART, PCO2ART, PO2ART in the last 168 hours.  Liver Enzymes  Recent Labs Lab 07/28/15 1850  AST 20  ALT 15  ALKPHOS 89  BILITOT 0.4  ALBUMIN 3.6    Cardiac Enzymes  Recent Labs Lab 07/28/15 1744  TROPONINI <0.03    Glucose  Recent Labs Lab 07/28/15 1738  GLUCAP 302*    Imaging Dg Chest 2 View  07/28/2015  CLINICAL DATA:  Left arm pain. EXAM: CHEST  2 VIEW COMPARISON:  None. FINDINGS: Blunting of the left costophrenic angle on the lateral view suggests small effusion. No other abnormalities. IMPRESSION: Suggested tiny left pleural effusion on the lateral view. No other abnormalities. Electronically Signed   By: Gerome Samavid  Williams III M.D   On: 07/28/2015 18:22   Ct Head Wo Contrast  07/28/2015  CLINICAL DATA:  Slurred speech.  Diabetes and hypertension EXAM: CT HEAD WITHOUT CONTRAST TECHNIQUE: Contiguous axial images were obtained from the base of the skull through the vertex without intravenous contrast. COMPARISON:  CT head 09/27/2014.  MRI 10/19/2014 FINDINGS: Mild atrophy for age. Negative for hydrocephalus. Low density throughout the cerebral white matter bilaterally unchanged and consistent with chronic ischemia. Small chronic lacunar infarct in the left caudate. Chronic infarct right thalamus. No acute infarct 5 mm density right posterior frontal cortex most likely an area of chronic hemorrhage. Multiple areas of chronic hemorrhage in the brain based on MRI. Negative for acute hemorrhage.  Negative for acute infarct or mass. Negative calvarium. IMPRESSION: Atrophy and chronic microvascular ischemia. Multiple areas of chronic hemorrhage in the brain based on prior MRI. No acute abnormality. Electronically Signed   By: Marlan Palauharles  Clark M.D.   On: 07/28/2015 18:21     STUDIES:  Head CT shows no acute  changes  CULTURES: None  ANTIBIOTICS: None  SIGNIFICANT EVENTS: HA + N/V early on 7/1  LINES/TUBES: PIV  DISCUSSION: 45 y/o woman with HTN emergency requiring cardiene gtt.  ASSESSMENT / PLAN:  PULMONARY A: No active issues P:    CARDIOVASCULAR A:  Hypertensive emergency P:  Restart home meds, with "now" doses. Has been out for several months Nicardipine gtt. Goal 180>SBP>160  RENAL A:   Mild renal injury P:   Likely early hypertensive nephropathy  GASTROINTESTINAL A:   Nausea and vomiting P:   Likely due to HTN emergency. Resolved with BP control.  HEMATOLOGIC A:   No active issues P:   INFECTIOUS A:   No active issues P:    ENDOCRINE A:   Hx of DM2   P:   SSI for now  NEUROLOGIC A:   Hx of ICH / stroke associated with HTN  P:   Main imputus for BP control at this point. Nicardipine for BP control.   FAMILY  Pt able to contact family as needed   CRITICAL CARE: The patient is critically ill with multiple organ systems failure and requires high complexity decision making for assessment and support, frequent evaluation and titration of therapies, application of advanced monitoring technologies and extensive interpretation of multiple databases. Critical Care Time devoted to patient care services described in this note is 45 minutes.  Jamie KatoAaron Annabella Elford, MD Pulmonary and Critical Care Medicine [facility] Pager: [phone]  07/29/2015, 12:58 AM

## 2015-07-29 NOTE — Progress Notes (Signed)
Pt called to nursing desk to request help, RN entered room quickly to find pt actively vomiting, vomit appears to be of undigested food, pt given emesis basin, pt denies vomiting previously, MD notified of vomiting, MD orders initiated, rapid response team notified, RN will continue to monitor, pt call bell within reach and bed in lowest position, rapid response team now at pt bedside.    Mila PalmerJohnson,Shrey Boike A, RN 07/28/15

## 2015-07-29 NOTE — Significant Event (Signed)
Rapid Response Event Note  Overview: called to room for vomiting, BP 200's/100's Time Called: 2320 Arrival Time: 2322 Event Type: Cardiac  Initial Focused Assessment: pt with BP 212/116, c/o all over headache, blurred vision and nausea. Pt denied CP. Pt w/slurred speech which is consistent w/CVA May 2016, no neurological changes.   Interventions:  Dr. Robb Matarrtiz at bedside, pt given Morphine 2 mg and Zofran 4 mg IVP, Clonidine 0.1 mg PO. Vomited immediately after PO's. Labetalol 20 mg IVP given w/minimal change in BP or associated symptoms. CCM consulted, pt admitted to ICU and Cardene gtt started at 5mg /hr prior to transfer. [facility], BP 184/100, HR 88. nausea, headache, blurred vision cleared .    Plan of Care (if not transferred):  Event Summary: Name of Physician Notified: Dr. Robb Matarrtiz at 2320  Name of Consulting Physician Notified: Dr. Sandford Crazeremble  at 2330  Outcome: Transferred (Comment) (transdferred to [id number])     Phillips GroutSHULAR, Sandi CarneLESLIE Paige

## 2015-07-29 NOTE — Progress Notes (Addendum)
Pt arrived to unit with elevated blood pressure, 227/128 in left arm, 213/112 in right arm, pt reports 5/10 headache that has been ongoing [facility], MD notified of pt arrival to unit and need for orders, waiting for MD orders, RN will continue to monitor, pt call bed within reach and bed in lowest position.     Mila PalmerJohnson,Nolene Rocks A, RN 07/28/15

## 2015-07-29 NOTE — Progress Notes (Signed)
PULMONARY / CRITICAL CARE MEDICINE   Name: Stephanie ShirtsMarva N Lloyd MRN: [id number] DOB: 06/21/1970    ADMISSION DATE:  07/28/2015  CHIEF COMPLAINT:  Headache  HISTORY OF PRESENT ILLNESS:   Stephanie Lloyd is a 45 y/o woman with a history of severe uncontrolled HTN and DM, as well as prior ICH related HTN who presented to her PCP's office today for L arm pain, and given her HTN (SBP >>200) she was directed to the ED. She was treated with a cardine gtt with improvement, and was accepted to the hospitalist service with the plan to restart her home meds. The gtt was stopped and she was transferred. However, her BP again rose to SBP around 230, and she had headache, nausea, and vomited, so PCCM was called for admission to restart cardeine gtt.  Subjective:  Weaned off cardene , b/p is improved Alert , tolerating diet . Headache resolved.  No n/v Has not been able to afford b/p rx , that is why she has not taken them   VITAL SIGNS: BP 145/84 mmHg  Pulse 58  Temp(Src) 98.4 F (36.9 C) (Oral)  Resp 16  Ht 5\' 10"  (1.778 m)  Wt 85.7 kg (188 lb 15 oz)  BMI 27.11 kg/m2  SpO2 99%  HEMODYNAMICS:    VENTILATOR SETTINGS:    INTAKE / OUTPUT: I/O last 3 completed shifts: In: 203.9 [I.V.:203.9] Out: 250 [Urine:250]  PHYSICAL EXAMINATION: General:  Middle aged woman in NAD Neuro:  A/o x 3 , MAEWx 4  HEENT:  MMM Cardiovascular:  S2>S1, RRR Lungs:  CTAB Abdomen:  Nontender Musculoskeletal:  No derfomities Skin:  No rashes  LABS:  BMET  Recent Labs Lab 07/28/15 1850 07/28/15 2345 07/29/15 0525  NA 136  --  138  K 3.4*  --  3.4*  CL 107  --  104  CO2 22  --  22  BUN 17  --  12  CREATININE 1.21* 0.97 1.06*  GLUCOSE 213*  --  293*    Electrolytes  Recent Labs Lab 07/28/15 1850 07/29/15 0525  CALCIUM 8.1* 9.2  MG 1.8 2.7*  PHOS  --  3.0    CBC  Recent Labs Lab 07/28/15 1744 07/28/15 2345  WBC 8.7 9.7  HGB 13.3 14.5  HCT 40.0 43.7  PLT 173 169    Coag's No results  for input(s): APTT, INR in the last 168 hours.  Sepsis Markers No results for input(s): LATICACIDVEN, PROCALCITON, O2SATVEN in the last 168 hours.  ABG No results for input(s): PHART, PCO2ART, PO2ART in the last 168 hours.  Liver Enzymes  Recent Labs Lab 07/28/15 1850  AST 20  ALT 15  ALKPHOS 89  BILITOT 0.4  ALBUMIN 3.6    Cardiac Enzymes  Recent Labs Lab 07/28/15 1744 07/28/15 2345 07/29/15 0525  TROPONINI <0.03 <0.03 0.03*    Glucose  Recent Labs Lab 07/28/15 1738 07/29/15 0627  GLUCAP 302* 288*    Imaging Dg Chest 2 View  07/28/2015  CLINICAL DATA:  Left arm pain. EXAM: CHEST  2 VIEW COMPARISON:  None. FINDINGS: Blunting of the left costophrenic angle on the lateral view suggests small effusion. No other abnormalities. IMPRESSION: Suggested tiny left pleural effusion on the lateral view. No other abnormalities. Electronically Signed   By: Gerome Samavid  Williams III M.D   On: 07/28/2015 18:22   Ct Head Wo Contrast  07/28/2015  CLINICAL DATA:  Slurred speech.  Diabetes and hypertension EXAM: CT HEAD WITHOUT CONTRAST TECHNIQUE: Contiguous axial images were obtained from  the base of the skull through the vertex without intravenous contrast. COMPARISON:  CT head 09/27/2014.  MRI 10/19/2014 FINDINGS: Mild atrophy for age. Negative for hydrocephalus. Low density throughout the cerebral white matter bilaterally unchanged and consistent with chronic ischemia. Small chronic lacunar infarct in the left caudate. Chronic infarct right thalamus. No acute infarct 5 mm density right posterior frontal cortex most likely an area of chronic hemorrhage. Multiple areas of chronic hemorrhage in the brain based on MRI. Negative for acute hemorrhage.  Negative for acute infarct or mass. Negative calvarium. IMPRESSION: Atrophy and chronic microvascular ischemia. Multiple areas of chronic hemorrhage in the brain based on prior MRI. No acute abnormality. Electronically Signed   By: Marlan Palauharles  Clark M.D.    On: 07/28/2015 18:21     STUDIES:  Head CT 6/30 shows no acute changes, atrophy/chronic microvascular ischemia , multiple areas of chronic hemorrhage in brain   CULTURES: None  ANTIBIOTICS: None  SIGNIFICANT EVENTS: HA + N/V early on 7/1  LINES/TUBES: PIV  DISCUSSION: 45 y/o woman with HTN emergency requiring cardiene gtt.  ASSESSMENT / PLAN:  PULMONARY A: No active issues P:   Monitor   CARDIOVASCULAR A:  Hypertensive emergency-resolved , weaned off cardene 7/1 am  HTN  P:  Continue Norvasc, lotensin, coreg.  Look at OP rx to make sure affordable options for pt.    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  Tolerating diet   HEMATOLOGIC A:   No active issues P:  Monitor cbc   INFECTIOUS A:   No active issues P:    ENDOCRINE A:   Hx of DM2  -hyperglycemia  P:   Add SSI tid  Cont amaryl and tradjenta .  Cont 70/30 Insulin Twice daily  /ac    NEUROLOGIC A:   Hx of ICH / stroke associated with HTN P:   Main imputus for BP control at this point.     FAMILY  Pt able to contact family as needed  Keitha Kolk NP-C  [facility]  [phone]    07/29/2015, 1:58 PM

## 2015-07-29 NOTE — Progress Notes (Signed)
CRITICAL VALUE ALERT  Critical value received:  Troponin 0.03  Date of notification:  07/29/15  Time of notification:  0725  Critical value read back:Yes.    Nurse who received alert:  Lamount Crankerrystal Milan Clare, RN  MD notified (1st page):  Rubye Oaksammy Parrett, CCM NP present on unit at time of page and notified of results

## 2015-07-29 NOTE — Progress Notes (Signed)
eLink Physician-Brief Progress Note Patient Name: Manfred ShirtsMarva N Renken DOB: 11/17/1970 MRN: [id number]   Date of Service  07/29/2015  HPI/Events of Note  RN calls 2/2 2 issues : 1. BP elevated at 180 systolic. Was better today WITH cardene drip. Drip was dcd and no PO meds given as BP was OK. Pt feels OK. (-) subjective complaints. 2. CBG in 40's. 2nd time. With nausea and vomiting. Got tradjenta this am. Was also on amaryl.   eICU Interventions  1. 10 mg Norvasc x 1 dose now. Pt has limited funds for meds. Suggest consult case manager in am. [facility]  2. Will d/c tradjenta and amaryl. Will start d5 NS 50 mls x 1 L. Observe further drops in CBG.       Intervention Category Intermediate Interventions: Other:  Louann SjogrenJose Angelo A De Dios 07/29/2015, 10:46 PM

## 2015-07-29 NOTE — Assessment & Plan Note (Signed)
Pt states she is taking her insulin but not checking her blood glucose

## 2015-07-29 NOTE — Progress Notes (Signed)
Pt now has orders to transfer to higher level of care, pt notified of need to transfer and educated to notify RN immediately of any changes in how she feels, RN will continue to monitor, pt call bell in reach and bed in lowest position.    Mila PalmerJohnson,Domanick Cuccia A, RN 07/29/2015

## 2015-07-29 NOTE — Progress Notes (Signed)
Hypoglycemic Event  CBG: 53  Treatment: 4 oz juice  Symptoms: none  Follow-up CBG: Time:2220 CBG Result:45  Repeat CBG after another 4 oz juice: 45 Repeat CBG after glucose gel : 44 Repeat CBG after D50 injection: 108  Possible Reasons for Event: unknown, pt states she ate her dinner  Comments/MD notified: MD also notified that patient's SBP elevated in the 180s with no prn orders.  Norvasc 10mg  ordered, as well as D5NS @50  cc/hr x 1 dose.  Will give meds to patient and continue to monitor.    Danniel Tones, Joan MayansKristina Nicole

## 2015-07-30 DIAGNOSIS — E876 Hypokalemia: Secondary | ICD-10-CM

## 2015-07-30 DIAGNOSIS — I161 Hypertensive emergency: Principal | ICD-10-CM

## 2015-07-30 DIAGNOSIS — E1159 Type 2 diabetes mellitus with other circulatory complications: Secondary | ICD-10-CM

## 2015-07-30 DIAGNOSIS — Z9119 Patient's noncompliance with other medical treatment and regimen: Secondary | ICD-10-CM

## 2015-07-30 DIAGNOSIS — N182 Chronic kidney disease, stage 2 (mild): Secondary | ICD-10-CM

## 2015-07-30 DIAGNOSIS — E785 Hyperlipidemia, unspecified: Secondary | ICD-10-CM

## 2015-07-30 LAB — BASIC METABOLIC PANEL
Anion gap: 10 (ref 5–15)
BUN: 19 mg/dL (ref 6–20)
CALCIUM: 9.1 mg/dL (ref 8.9–10.3)
CO2: 25 mmol/L (ref 22–32)
CREATININE: 1.29 mg/dL — AB (ref 0.44–1.00)
Chloride: 103 mmol/L (ref 101–111)
GFR calc Af Amer: 56 mL/min — ABNORMAL LOW (ref >60)
GFR, EST NON AFRICAN AMERICAN: 49 mL/min — AB (ref >60)
GLUCOSE: 111 mg/dL — AB (ref 65–99)
Potassium: 3.1 mmol/L — ABNORMAL LOW (ref 3.5–5.1)
Sodium: 138 mmol/L (ref 135–145)

## 2015-07-30 LAB — GLUCOSE, CAPILLARY
GLUCOSE-CAPILLARY: 46 mg/dL — AB (ref 65–99)
GLUCOSE-CAPILLARY: 163 mg/dL — AB (ref 65–99)
GLUCOSE-CAPILLARY: 55 mg/dL — AB (ref 65–99)
GLUCOSE-CAPILLARY: 54 mg/dL — AB (ref 65–99)
GLUCOSE-CAPILLARY: 163 mg/dL — AB (ref 65–99)
Glucose-Capillary: 132 mg/dL — ABNORMAL HIGH (ref 65–99)
Glucose-Capillary: 204 mg/dL — ABNORMAL HIGH (ref 65–99)
Glucose-Capillary: 140 mg/dL — ABNORMAL HIGH (ref 65–99)

## 2015-07-30 LAB — CBC
HCT: 41.7 % (ref 36.0–46.0)
Hemoglobin: 13.4 g/dL (ref 12.0–15.0)
MCH: 26.6 pg (ref 26.0–34.0)
MCHC: 32.1 g/dL (ref 30.0–36.0)
MCV: 82.7 fL (ref 78.0–100.0)
PLATELETS: 194 10*3/uL (ref 150–400)
RBC: 5.04 MIL/uL (ref 3.87–5.11)
RDW: 13.9 % (ref 11.5–15.5)
WBC: 11.2 10*3/uL — AB (ref 4.0–10.5)

## 2015-07-30 LAB — PHOSPHORUS: Phosphorus: 3.2 mg/dL (ref 2.5–4.6)

## 2015-07-30 LAB — MAGNESIUM: Magnesium: 2.1 mg/dL (ref 1.7–2.4)

## 2015-07-30 LAB — TROPONIN I: Troponin I: 0.06 ng/mL (ref <0.03)

## 2015-07-30 MED ORDER — HYDRALAZINE HCL 20 MG/ML IJ SOLN
10.0000 mg | Freq: Once | INTRAMUSCULAR | Status: AC
  Administered 2015-07-30: 10 mg via INTRAVENOUS
  Filled 2015-07-30: qty 1

## 2015-07-30 MED ORDER — HYDRALAZINE HCL 20 MG/ML IJ SOLN
INTRAMUSCULAR | Status: AC
  Filled 2015-07-30: qty 1

## 2015-07-30 MED ORDER — DEXTROSE 5 % IV SOLN
INTRAVENOUS | Status: DC
  Administered 2015-07-30 – 2015-07-31 (×3): via INTRAVENOUS

## 2015-07-30 MED ORDER — ZOLPIDEM TARTRATE 5 MG PO TABS
5.0000 mg | ORAL_TABLET | Freq: Once | ORAL | Status: AC
  Administered 2015-07-30: 5 mg via ORAL
  Filled 2015-07-30: qty 1

## 2015-07-30 MED ORDER — DEXTROSE 50 % IV SOLN
INTRAVENOUS | Status: AC
  Administered 2015-07-30: 25 mL
  Filled 2015-07-30: qty 50

## 2015-07-30 MED ORDER — HYDRALAZINE HCL 20 MG/ML IJ SOLN
10.0000 mg | INTRAMUSCULAR | Status: DC | PRN
  Administered 2015-07-30 – 2015-08-01 (×2): 10 mg via INTRAVENOUS
  Filled 2015-07-30: qty 1

## 2015-07-30 MED ORDER — DEXTROSE 50 % IV SOLN
INTRAVENOUS | Status: AC
  Filled 2015-07-30: qty 50

## 2015-07-30 NOTE — Progress Notes (Signed)
eLink Physician-Brief Progress Note Patient Name: Stephanie ShirtsMarva N Lloyd DOB: 12/13/1970 MRN: [id number]   Date of Service  07/30/2015  HPI/Events of Note  Hypoglycemia Hypertension  eICU Interventions  Plan: Give full amp of D50 Change IVFs to D5W @ 75 cc/hr Hydralazine 10 mg PRN for systolic greater than 170     Intervention Category Intermediate Interventions: Hypertension - evaluation and management;Other:  Eknoor Novack 07/30/2015, 4:20 AM

## 2015-07-30 NOTE — Progress Notes (Signed)
MD on call notified that pt had a SBP >200 and complains of a headache.  8 am dose of coreg given. There are no current prns for headache.  Awaiting MD orders. Will report off to oncoming nurse concerning BP. Will continue to monitor patient.

## 2015-07-30 NOTE — Progress Notes (Signed)
PROGRESS NOTE  Stephanie ShirtsMarva N Lloyd [id number] DOB: 01/13/1971 DOA: 07/28/2015 PCP: Donato SchultzYvonne R [name], DO  HPI/Recap of past 24 hours:  Drowsy, c/o headache, bp better  Assessment/Plan: Principal Problem:   Hypertensive emergency Active Problems:   Hemorrhagic stroke (HCC)   Hyperlipidemia LDL goal <70   Type 2 diabetes mellitus with other circulatory complications (HCC)   Hyperlipidemia   Hypokalemia  HTN emergency:  admitted to icu on 7/1 on nicardipine drip, now transitioned to oral meds and on hospitalist service from 7/2 Now on norvasx/benzepril/coreg Will get UDS and renal artery duplex.  Hypokalemia: replace  Diabetes: does not take any meds at home, a1c pending, on ssi here, diabetic education  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  Medication noncompliance, education provided, care management consulted for meds assistance  Code Status: full  Family Communication: patient   Disposition Plan: likely home on 7/3   Consultants:  Care management  Procedures:  none  Antibiotics:  none   Objective: BP 136/78 mmHg  Pulse 92  Temp(Src) 97.6 F (36.4 C) (Oral)  Resp 18  Ht 5\' 10"  (1.778 m)  Wt 87.227 kg (192 lb 4.8 oz)  BMI 27.59 kg/m2  SpO2 100%  Intake/Output Summary (Last 24 hours) at 07/30/15 1247 Last data filed at 07/30/15 0930  Gross per 24 hour  Intake    290 ml  Output      0 ml  Net    290 ml   Filed Weights   07/28/15 2256 07/29/15 0213 07/30/15 1026  Weight: 80.831 kg (178 lb 3.2 oz) 85.7 kg (188 lb 15 oz) 87.227 kg (192 lb 4.8 oz)    Exam:   General:  Drowsy, NAD  Cardiovascular: RRR  Respiratory:  CTABL  Abdomen: Soft/ND/NT, positive BS  Musculoskeletal: No Edema  Neuro: aaox3, mild dysarthria , reported from prior ich, left sided weakness has largely resolved from last ICH in 05/2015  Data Reviewed: Basic Metabolic Panel:  Recent Labs Lab 07/28/15 1850 07/28/15 2345 07/29/15 0525 07/30/15 0424  NA 136  --  138 138  K 3.4*  --  3.4* 3.1*  CL 107  --  104 103  CO2 22  --  22 25  GLUCOSE 213*  --  293* 111*  BUN 17  --  12 19  CREATININE 1.21* 0.97 1.06* 1.29*  CALCIUM 8.1*  --  9.2 9.1  MG 1.8  --  2.7* 2.1  PHOS  --   --  3.0 3.2   Liver Function Tests:  Recent Labs Lab 07/28/15 1850  AST 20  ALT 15  ALKPHOS 89  BILITOT 0.4  PROT 6.4*  ALBUMIN 3.6   No results for input(s): LIPASE, AMYLASE in the last 168 hours. No results for input(s): AMMONIA in the last 168 hours. CBC:  Recent Labs Lab 07/28/15 1744 07/28/15 2345 07/30/15 0424  WBC 8.7 9.7 11.2*  HGB 13.3 14.5 13.4  HCT 40.0 43.7 41.7  MCV 80.2 81.4 82.7  PLT 173 169 194   Cardiac Enzymes:    Recent Labs Lab 07/28/15 1744 07/28/15 2345 07/29/15 0525 07/30/15 0424  TROPONINI <0.03 <0.03 0.03* 0.06*   BNP (  last 3 results) No results for input(s): BNP in the last 8760 hours.  ProBNP (last 3 results) No results for input(s): PROBNP in the last 8760 hours.  CBG:  Recent Labs Lab 07/30/15 0439 07/30/15 0808 07/30/15 0838 07/30/15 0933 07/30/15 1208  GLUCAP 163* 55* 54* 132* 204*    Recent Results (from the past 240 hour(s))  MRSA PCR Screening     Status: None   Collection Time: 07/29/15  1:28 AM  Result Value Ref Range Status   MRSA by PCR NEGATIVE NEGATIVE Final    Comment:        The GeneXpert MRSA Assay (FDA approved for NASAL specimens only), is one component of a comprehensive MRSA colonization surveillance program. It is not intended to diagnose MRSA infection nor to guide or monitor treatment for MRSA infections.      Studies: No results  found.  Scheduled Meds: . amLODipine  10 mg Oral Daily  . benazepril  20 mg Oral Daily  . carvedilol  6.25 mg Oral BID WC  . dextrose      . enoxaparin (LOVENOX) injection  40 mg Subcutaneous QHS  . insulin aspart  0-15 Units Subcutaneous TID WC  . insulin aspart protamine- aspart  20 Units Subcutaneous BID WC  . potassium chloride SA  20 mEq Oral BID  . pravastatin  40 mg Oral q1800  . sodium chloride flush  3 mL Intravenous Q12H    Continuous Infusions: . dextrose 75 mL/hr at 07/30/15 0426     Time spent: 35mins  Deisy Ozbun MD, PhD  [facility] Pager [phone]. If 7PM-7AM, please contact night-coverage at [email or web address], password [email or web address] 07/30/2015, 12:47 PM  LOS: 1 day

## 2015-07-30 NOTE — Progress Notes (Signed)
Hypoglycemic Event  CBG: 55  Treatment: 15 GM carbohydrate snack  Symptoms: drowsy  Follow-up CBG: Time: 838 CBG Result:54  Possible Reasons for Event: Unknown  Comments/MD notified:continue to monitor    Harlon FlorWhitaker, Derryl HarborLonnie Ray

## 2015-07-30 NOTE — Progress Notes (Signed)
Hypoglycemic Event  CBG: 54  Treatment: D50 IV 25 mL  Symptoms: drowsy  Follow-up CBG: Time:933 CBG Result:132  Possible Reasons for Event: Unknown  Comments/MD notified: held am insulin    Madelin RearWhitaker, Keanna Tugwell Ray

## 2015-07-30 NOTE — Progress Notes (Signed)
Hypoglycemic Event  CBG: 43  Treatment: 1 amp Dextrose 50%  Symptoms: none  Follow-up CBG: Time:04:39 CBG Result:163  Possible Reasons for Event: previous oral glycemic medications  Comments/MD notified: MD also notified that BP is elevated again at 183/105.  1 amp D50, 10mg  hydralazine, and D5W ordered.  Will give patient medications, and continue to monitor patient progress.    Derek Laughter, Joan MayansKristina Nicole

## 2015-07-31 ENCOUNTER — Inpatient Hospital Stay (HOSPITAL_COMMUNITY): Payer: BLUE CROSS/BLUE SHIELD

## 2015-07-31 DIAGNOSIS — I10 Essential (primary) hypertension: Secondary | ICD-10-CM

## 2015-07-31 DIAGNOSIS — N183 Chronic kidney disease, stage 3 (moderate): Secondary | ICD-10-CM

## 2015-07-31 LAB — GLUCOSE, CAPILLARY
GLUCOSE-CAPILLARY: 110 mg/dL — AB (ref 65–99)
GLUCOSE-CAPILLARY: 127 mg/dL — AB (ref 65–99)
GLUCOSE-CAPILLARY: 93 mg/dL (ref 65–99)
Glucose-Capillary: 153 mg/dL — ABNORMAL HIGH (ref 65–99)
Glucose-Capillary: 126 mg/dL — ABNORMAL HIGH (ref 65–99)
Glucose-Capillary: 67 mg/dL (ref 65–99)

## 2015-07-31 LAB — BASIC METABOLIC PANEL
Anion gap: 22 — ABNORMAL HIGH (ref 5–15)
BUN: 19 mg/dL (ref 6–20)
CALCIUM: 10.2 mg/dL (ref 8.9–10.3)
CO2: 19 mmol/L — AB (ref 22–32)
CREATININE: 1.48 mg/dL — AB (ref 0.44–1.00)
Chloride: 97 mmol/L — ABNORMAL LOW (ref 101–111)
GFR calc non Af Amer: 41 mL/min — ABNORMAL LOW (ref >60)
GFR, EST AFRICAN AMERICAN: 48 mL/min — AB (ref >60)
Glucose, Bld: 95 mg/dL (ref 65–99)
Potassium: 3.8 mmol/L (ref 3.5–5.1)
SODIUM: 138 mmol/L (ref 135–145)

## 2015-07-31 LAB — MAGNESIUM: MAGNESIUM: 1.9 mg/dL (ref 1.7–2.4)

## 2015-07-31 LAB — CBC
HCT: 45.9 % (ref 36.0–46.0)
Hemoglobin: 15 g/dL (ref 12.0–15.0)
MCH: 27 pg (ref 26.0–34.0)
MCHC: 32.7 g/dL (ref 30.0–36.0)
MCV: 82.6 fL (ref 78.0–100.0)
Platelets: 198 10*3/uL (ref 150–400)
RBC: 5.56 MIL/uL — ABNORMAL HIGH (ref 3.87–5.11)
RDW: 13.9 % (ref 11.5–15.5)
WBC: 13.6 10*3/uL — ABNORMAL HIGH (ref 4.0–10.5)

## 2015-07-31 LAB — RAPID URINE DRUG SCREEN, HOSP PERFORMED
AMPHETAMINES: NOT DETECTED
BARBITURATES: NOT DETECTED
BENZODIAZEPINES: NOT DETECTED
COCAINE: NOT DETECTED
Opiates: NOT DETECTED
TETRAHYDROCANNABINOL: NOT DETECTED

## 2015-07-31 LAB — PHOSPHORUS: PHOSPHORUS: 3.8 mg/dL (ref 2.5–4.6)

## 2015-07-31 MED ORDER — SODIUM CHLORIDE 0.9 % IV SOLN
INTRAVENOUS | Status: AC
  Administered 2015-07-31: 10:00:00 via INTRAVENOUS

## 2015-07-31 MED ORDER — LIVING WELL WITH DIABETES BOOK
Freq: Once | Status: AC
  Administered 2015-07-31: 17:00:00
  Filled 2015-07-31: qty 1

## 2015-07-31 MED ORDER — INSULIN STARTER KIT- SYRINGES (ENGLISH)
1.0000 | Freq: Once | Status: AC
  Administered 2015-08-01: 1
  Filled 2015-07-31: qty 1

## 2015-07-31 NOTE — Progress Notes (Addendum)
Inpatient Diabetes Program Recommendations  AACE/ADA: New Consensus Statement on Inpatient Glycemic Control (2015)  Target Ranges:  Prepandial:   less than 140 mg/dL      Peak postprandial:   less than 180 mg/dL (1-2 hours)      Critically ill patients:  140 - 180 mg/dL   Lab Results  Component Value Date   GLUCAP 153* 07/31/2015   HGBA1C 8.4* 05/23/2015    Review of Glycemic Control Consult received regarding lack of diabetes care compliance and education. Patient has had education both in-patient and out-patient and has had a dietician consult while here this admission. I have ordered in-patient education via network videos and d/c educational materials.  Prior notes state that patient has wanted to lose weight and control her diabetes with weight control. It appears that patient has not had insurance at various times and may have been unable to afford the medications. This diabetes coordinator will see patient while here to further assess needs. Glucose is well controlled on present 70/30 regimen. Would recommend patient be discharged on either the Novolin 70/30 if patient has no insurance, or novolog 70/30 mix.   Thank you Lenor CoffinAnn Makinley Muscato, RN, MSN, CDE  Diabetes Inpatient Program Office: [phone] Pager: [phone] 8:00 am to 5:00 pm

## 2015-07-31 NOTE — Progress Notes (Signed)
Preliminary results by tech - Renal Duplex Completed. No evidence of renal stenosis noted in bilateral renal arteries.  Stephanie Lloyd, BS, RDMS, RVT

## 2015-07-31 NOTE — Progress Notes (Signed)
Inpatient Diabetes Program Recommendations  AACE/ADA: New Consensus Statement on Inpatient Glycemic Control (2015)  Target Ranges:  Prepandial:   less than 140 mg/dL      Peak postprandial:   less than 180 mg/dL (1-2 hours)      Critically ill patients:  140 - 180 mg/dL   Lab Results  Component Value Date   GLUCAP 126* 07/31/2015   HGBA1C 8.4* 05/23/2015    Review of Glycemic Control  Inpatient Diabetes Program Recommendations:    Spoke with patient in person to assess educational and compliance issues and needs. Patient states that she has been on insulin in the past however, she is unsure as to what that particular insulin was. She states she has always had better control when she was on insulin. When asked why she was taken off the insulin, she stated that she could not remember why.  She states she used a vial and syringe with administering her insulin in the past and she remembers how to use it.  Would recommend ReliOn (from [facility])  Novolin 70/30 insulin for discharge at the same dosage as here at 20 units in the am and 20 units ac supper. Although she has [employer]BS of [employer], she states that the newer insulins were still too expensive.  Aware that care management is helping with medication assistance. Patient states that the cost of cbg meter strips is well covered by her insurance. Patient is eager to watch the videos on the educational network especially on diet and nutrition. RN has attempted to help her watch, but the program was busy at the time. She will continue to try to watch. Patient has not had previous formal education as an out-patient as she has not been able to afford even with insurance. Patient states that she can try to do her best but she will deal with whatever results with her health. This diabetes coordinator emphasized that her quality of llife will be improved with glucose control. Will order an insulin starter kit in the case that insulin is ordered at  discharge. RN's can review and have patient demonstrate that she knows how to draw up and administer. Patient can also watch the ed'l video on insulin administration as well.  Thank you Stephanie Kea, RN, MSN, CDE  Diabetes Inpatient Program Office: [phone] Pager: [phone] 8:00 am to 5:00 pm

## 2015-07-31 NOTE — Plan of Care (Signed)
Problem: Food- and Nutrition-Related Knowledge Deficit (NB-1.1) Goal: Nutrition education Formal process to instruct or train a patient/client in a skill or to impart knowledge to help patients/clients voluntarily manage or modify food choices and eating behavior to maintain or improve health. Outcome: Adequate for Discharge  RD consulted for nutrition education regarding diabetes.     Lab Results  Component Value Date    HGBA1C 8.4* 05/23/2015   Spoke with pt at bedside. She reports blood sugar are suboptimally controlled (ranging between 200-300's) due to inability to afford medications. She shares she has been unable to access BP and DM medications over the past 3 months.   Pt consumes 3 meals per day (Breakfast: eggs or oatmeal, lunch: sandwich with chips, Dinner: chicken and vegetables). She reports she has been more mindful of servings sizes of carbohydrates to assist with glycemic control and weight loss. She met with a DM education on 08/19/14 and was able to recall details of appointment with this RD and teach back basics of DM diet and management. She reports she has also been trying to use a walking DVD to assist with weight management (uses for 30 minutes 2 times weekly).   RD provided "Carbohydrate Counting for People with Diabetes" handout from the Academy of Nutrition and Dietetics. Discussed different food groups and their effects on blood sugar, emphasizing carbohydrate-containing foods. Provided list of carbohydrates and recommended serving sizes of common foods.  Discussed importance of controlled and consistent carbohydrate intake throughout the day. Provided examples of ways to balance meals/snacks and encouraged intake of high-fiber, whole grain complex carbohydrates. Teach back method used.  Expect fair to good compliance.  Body mass index is 27.59 kg/(m^2). Pt meets criteria for overweight based on current BMI.  Current diet order is Heart Healthy/ carb Modified, patient  is consuming approximately 75% of meals at this time. Labs and medications reviewed. No further nutrition interventions warranted at this time. RD contact information provided. If additional nutrition issues arise, please re-consult RD.  Geovanny Sartin A. Jimmye Norman, RD, LDN, CDE Pager: [phone] After hours Pager: [phone]

## 2015-07-31 NOTE — Progress Notes (Signed)
PROGRESS NOTE  Stephanie ShirtsMarva N Lloyd WJX:[id number] DOB: 12/06/1970 DOA: 07/28/2015 PCP: Donato SchultzYvonne R [name], DO  HPI/Recap of past 24 hours:  Much more alert and awake, sitting in chair, reported right hand numbness lasted for about 15mins, now completely resolved, no weakness, no headache, c bp better, thought cr elevated  Assessment/Plan: Principal Problem:   Hypertensive emergency Active Problems:   Hemorrhagic stroke (HCC)   Hyperlipidemia LDL goal <70   Type 2 diabetes mellitus with other circulatory complications (HCC)   Hyperlipidemia   Hypokalemia  HTN emergency:  admitted to icu on 7/1 on nicardipine drip, now transitioned to oral meds and on hospitalist service from 7/2 Now on norvasc/benzepril/coreg negative UDS and renal artery duplex preformed, result pending  Hypokalemia: replace, mag 1.9  Diabetes: does not take any meds at home, a1c pending, on ssi here, diabetic education  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 ua no infection.  HLD; she was prescribed statin, but she has not been taking it for months. ldl 164 in 04/2015.  Medication noncompliance, education provided, care management consulted for meds assistance  Code Status: full  Family Communication: patient   Disposition Plan: likely home on 7/4   Consultants: Patient is admitted to icu on 7/1, hospitalist service pick up on 7/2  Care management for meds assistance  Diabetic education RN   dietician RN  Procedures:  none  Antibiotics:  none   Objective: BP 122/76 mmHg  Pulse 72  Temp(Src) 98.6 F (37 C) (Oral)  Resp 18  Ht 5\' 10"  (1.778 m)  Wt 87.227 kg (192 lb 4.8 oz)  BMI 27.59 kg/m2  SpO2  100%  Intake/Output Summary (Last 24 hours) at 07/31/15 1557 Last data filed at 07/30/15 1811  Gross per 24 hour  Intake 1031.25 ml  Output      0 ml  Net 1031.25 ml   Filed Weights   07/28/15 2256 07/29/15 0213 07/30/15 1026  Weight: 80.831 kg (178 lb 3.2 oz) 85.7 kg (188 lb 15 oz) 87.227 kg (192 lb 4.8 oz)    Exam:   General:  More alert, NAD  Cardiovascular: RRR  Respiratory: CTABL  Abdomen: Soft/ND/NT, positive BS  Musculoskeletal: No Edema  Neuro: aaox3, mild dysarthria , reported from prior ich, left sided weakness has largely resolved from last ICH in 05/2015  Data Reviewed: Basic Metabolic Panel:  Recent Labs Lab 07/28/15 1850 07/28/15 2345 07/29/15 0525 07/30/15 0424 07/31/15 0514  NA 136  --  138 138 138  K 3.4*  --  3.4* 3.1* 3.8  CL 107  --  104 103 97*  CO2 22  --  22 25 19*  GLUCOSE 213*  --  293* 111* 95  BUN 17  --  12 19 19   CREATININE 1.21* 0.97 1.06* 1.29* 1.48*  CALCIUM 8.1*  --  9.2 9.1 10.2  MG 1.8  --  2.7* 2.1 1.9  PHOS  --   --  3.0 3.2 3.8   Liver Function Tests:  Recent Labs Lab 07/28/15 1850  AST 20  ALT 15  ALKPHOS 89  BILITOT 0.4  PROT 6.4*  ALBUMIN 3.6   No results for input(s): LIPASE, AMYLASE in the last 168 hours. No  results for input(s): AMMONIA in the last 168 hours. CBC:  Recent Labs Lab 07/28/15 1744 07/28/15 2345 07/30/15 0424 07/31/15 0514  WBC 8.7 9.7 11.2* 13.6*  HGB 13.3 14.5 13.4 15.0  HCT 40.0 43.7 41.7 45.9  MCV 80.2 81.4 82.7 82.6  PLT 173 169 194 198   Cardiac Enzymes:    Recent Labs Lab 07/28/15 1744 07/28/15 2345 07/29/15 0525 07/30/15 0424  TROPONINI <0.03 <0.03 0.03* 0.06*   BNP (last 3 results) No results for input(s): BNP in the last 8760 hours.  ProBNP (last 3 results) No results for input(s): PROBNP in the last 8760 hours.  CBG:  Recent Labs Lab 07/30/15 2145 07/31/15 0620 07/31/15 0806 07/31/15 1212 07/31/15 1539  GLUCAP 163* 110* 127* 153* 126*    Recent  Results (from the past 240 hour(s))  MRSA PCR Screening     Status: None   Collection Time: 07/29/15  1:28 AM  Result Value Ref Range Status   MRSA by PCR NEGATIVE NEGATIVE Final    Comment:        The GeneXpert MRSA Assay (FDA approved for NASAL specimens only), is one component of a comprehensive MRSA colonization surveillance program. It is not intended to diagnose MRSA infection nor to guide or monitor treatment for MRSA infections.      Studies: No results found.  Scheduled Meds: . amLODipine  10 mg Oral Daily  . benazepril  20 mg Oral Daily  . carvedilol  6.25 mg Oral BID WC  . enoxaparin (LOVENOX) injection  40 mg Subcutaneous QHS  . insulin aspart  0-15 Units Subcutaneous TID WC  . insulin aspart protamine- aspart  20 Units Subcutaneous BID WC  . potassium chloride SA  20 mEq Oral BID  . pravastatin  40 mg Oral q1800  . sodium chloride flush  3 mL Intravenous Q12H    Continuous Infusions: . sodium chloride 75 mL/hr at 07/31/15 1019  . dextrose 75 mL/hr at 07/31/15 0618     Time spent: 35mins  Keshia Weare MD, PhD  [facility] Pager [phone]. If 7PM-7AM, please contact night-coverage at [email or web address], password [email or web address] 07/31/2015, 3:57 PM  LOS: 2 days

## 2015-08-01 DIAGNOSIS — I619 Nontraumatic intracerebral hemorrhage, unspecified: Secondary | ICD-10-CM

## 2015-08-01 LAB — BASIC METABOLIC PANEL
Anion gap: 9 (ref 5–15)
BUN: 22 mg/dL — ABNORMAL HIGH (ref 6–20)
CHLORIDE: 107 mmol/L (ref 101–111)
CO2: 21 mmol/L — AB (ref 22–32)
CREATININE: 1.2 mg/dL — AB (ref 0.44–1.00)
Calcium: 9.4 mg/dL (ref 8.9–10.3)
GFR calc non Af Amer: 53 mL/min — ABNORMAL LOW (ref >60)
Glucose, Bld: 71 mg/dL (ref 65–99)
POTASSIUM: 4.3 mmol/L (ref 3.5–5.1)
SODIUM: 137 mmol/L (ref 135–145)

## 2015-08-01 LAB — PHOSPHORUS: Phosphorus: 4.5 mg/dL (ref 2.5–4.6)

## 2015-08-01 LAB — CBC
HEMATOCRIT: 41.9 % (ref 36.0–46.0)
HEMOGLOBIN: 14 g/dL (ref 12.0–15.0)
MCH: 27.5 pg (ref 26.0–34.0)
MCHC: 33.4 g/dL (ref 30.0–36.0)
MCV: 82.2 fL (ref 78.0–100.0)
Platelets: 178 10*3/uL (ref 150–400)
RBC: 5.1 MIL/uL (ref 3.87–5.11)
RDW: 13.9 % (ref 11.5–15.5)
WBC: 12 10*3/uL — ABNORMAL HIGH (ref 4.0–10.5)

## 2015-08-01 LAB — HEMOGLOBIN A1C
Hgb A1c MFr Bld: 8.8 % — ABNORMAL HIGH (ref 4.8–5.6)
MEAN PLASMA GLUCOSE: 206 mg/dL

## 2015-08-01 LAB — MAGNESIUM: Magnesium: 1.8 mg/dL (ref 1.7–2.4)

## 2015-08-01 LAB — GLUCOSE, CAPILLARY
Glucose-Capillary: 87 mg/dL (ref 65–99)
Glucose-Capillary: 90 mg/dL (ref 65–99)

## 2015-08-01 MED ORDER — CARVEDILOL 12.5 MG PO TABS: 12.5000 mg | ORAL_TABLET | Freq: Two times a day (BID) | ORAL | Status: DC

## 2015-08-01 MED ORDER — LOVASTATIN 20 MG PO TABS: 40.0000 mg | ORAL_TABLET | Freq: Every day | ORAL | Status: DC

## 2015-08-01 MED ORDER — BENAZEPRIL HCL 40 MG PO TABS: 40.0000 mg | ORAL_TABLET | Freq: Every day | ORAL | Status: DC

## 2015-08-01 MED ORDER — INSULIN NPH ISOPHANE & REGULAR (70-30) 100 UNIT/ML ~~LOC~~ SUSP: 15.0000 [IU] | Freq: Two times a day (BID) | SUBCUTANEOUS | Status: DC

## 2015-08-01 NOTE — Discharge Summary (Signed)
Discharge Summary  Stephanie ShirtsMarva N Lloyd [id number] DOB: 04/20/1970  PCP: Donato SchultzYvonne R [name], DO  Admit date: 07/28/2015 Discharge date: 08/01/2015  Time spent: >7330mins, more then 50% time spent on coordination of care.  Recommendations for Outpatient Follow-up:  1. F/u with PMD within a week  for hospital discharge follow up, repeat cbc/bmp at follow up. Patient to continue to work with her primary care physician for blood pressure and blood sugar control  Discharge Diagnoses:  Active Hospital Problems   Diagnosis Date Noted  . Hypertensive emergency 07/29/2015  . Hypokalemia 07/28/2015  . Hyperlipidemia 09/21/2014  . Type 2 diabetes mellitus with other circulatory complications (HCC) 09/21/2014  . Hyperlipidemia LDL goal <70 07/29/2014  . Hemorrhagic stroke (HCC) 06/22/2014    Resolved Hospital Problems   Diagnosis Date Noted Date Resolved  No resolved problems to display.    Discharge Condition: stable  Diet recommendation: heart healthy/carb modified  Filed Weights   07/29/15 0213 07/30/15 1026 08/01/15 0526  Weight: 85.7 kg (188 lb 15 oz) 87.227 kg (192 lb 4.8 oz) 88.089 kg (194 lb 3.2 oz)    History of present illness:  CHIEF COMPLAINT: Headache  HISTORY OF PRESENT ILLNESS:  [name] is a 45 y/o woman with a history of severe uncontrolled HTN and DM, as well as prior ICH related HTN who presented to her PCP's office today for L arm pain, and given her HTN (SBP >>200) she was directed to the ED. She was treated with a cardine gtt with improvement, and was accepted to the hospitalist service with the plan to restart her home meds. The gtt was stopped and she was transferred. However, her BP again rose to SBP around 230, and she had headache, nausea, and vomited, so PCCM was called for admission to restart cardeine gtt.  PAST MEDICAL HISTORY :  She  has a past medical history of Immune deficiency disorder (HCC); Diabetes (HCC); Diabetes mellitus without  complication (HCC); Hypertension; and Stroke [facility](HCC).  Hospital Course:  Principal Problem:   Hypertensive emergency Active Problems:   Hemorrhagic stroke (HCC)   Hyperlipidemia LDL goal <70   Type 2 diabetes mellitus with other circulatory complications (HCC)   Hyperlipidemia   Hypokalemia   HTN emergency: due to not taking meds  admitted to icu on 7/1 on nicardipine drip, now transitioned to oral meds and on hospitalist service from 7/2 Now on norvasc/benzepril/coreg negative UDS and renal artery duplex preformed, prelim reading no stenosis. She is discharged on benazepril 40mg  po daily  /coreg 12.5 mg bid, norvasc discontinued due to not on 4dollar list, could consider  Adding hydralazine which is also on 4dollar list in the future if her blood pressure if not well controlled on current regimen.   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  HLD; she was prescribed statin, but she has not been taking it for months. ldl 164 in 04/2015.  Medication noncompliance,  Patient states she did not take any of her meds prescribed to her due to not able  to afford it.  education provided, care management consulted for meds assistance [facility] list provided to patient. All meds from 4dollar list.  Insulin is [facility] brand Relion (Novolin 70/30), 25dollars a vial.  Code Status: full  Family Communication: patient   Disposition Plan: home on 7/4   Consultants: Patient is admitted to icu on 7/1, hospitalist  service pick up on 7/2  Care management for meds assistance  Diabetic education RN  dietician RN  Procedures:  none  Antibiotics:  none   Discharge Exam: BP 162/103 mmHg  Pulse 69  Temp(Src) 97.6 F (36.4 C) (Oral)  Resp 18  Ht  (1.778 m)  Wt 88.089 kg (194 lb 3.2 oz)  BMI 27.86 kg/m2  SpO2 100%    General: drowsiness has resolved, now fully alert, NAD  Cardiovascular: RRR  Respiratory: CTABL  Abdomen: Soft/ND/NT, positive BS  Musculoskeletal: No Edema  Neuro: aaox3, mild dysarthria , reported from prior ICH in 05/2015, left sided weakness has largely resolved from last ICH in 05/2015   Discharge Instructions You were cared for by a hospitalist during your hospital stay. If you have any questions about your discharge medications or the care you received while you were in the hospital after you are discharged, you can call the unit and asked to speak with the hospitalist on call if the hospitalist that took care of you is not available. Once you are discharged, your primary care physician will handle any further medical issues. Please note that NO REFILLS for any discharge medications will be authorized once you are discharged, as it is imperative that you return to your primary care physician (or establish a relationship with a primary care physician if you do not have one) for your aftercare needs so that they can reassess your need for medications and monitor your lab values.      Discharge Instructions    Diet - low sodium heart healthy    Complete by:  As directed   Carb modified     Increase activity slowly    Complete by:  As directed             Medication List    TAKE these medications        benazepril 40 MG tablet  Commonly known as:  LOTENSIN  Take 1 tablet (40 mg total) by mouth daily.     carvedilol 12.5 MG tablet  Commonly known as:  COREG  Take 1 tablet (12.5 mg total) by mouth 2 (two) times daily with a meal.     insulin  NPH-regular Human (70-30) 100 UNIT/ML injection  Commonly known as:  NOVOLIN 70/30 RELION  Inject 15 Units into the skin 2 (two) times daily with a meal. 20 units SubQ bid 30-45 min before meals     lovastatin 20 MG tablet  Commonly known as:  MEVACOR  Take 2 tablets (40 mg total) by mouth at bedtime.       Allergies  Allergen Reactions  . Metformin And Related Diarrhea   Follow-up Information    Follow up with Donato Schultz, DO In 1 week.   Specialty:  Family Medicine   Why:  hospital discharge follow up.repeat bmp,  bring in your blood pressure record to your doctor for further titration of your blood pressure meds, continue to work with your pmd for  blood sugar control   Contact information:   [address] [address] [place] [address] [phone]  The results of significant diagnostics from this hospitalization (including imaging, microbiology, ancillary and laboratory) are listed below for reference.    Significant Diagnostic Studies: Dg Chest 2 View  07/28/2015  CLINICAL DATA:  Left arm pain. EXAM: CHEST  2 VIEW COMPARISON:  None. FINDINGS: Blunting of the left costophrenic angle on the lateral view suggests small effusion. No other abnormalities. IMPRESSION: Suggested tiny left pleural effusion on the lateral view. No other abnormalities. Electronically Signed   By: Gerome Samavid  Williams III M.D   On: 07/28/2015 18:22   Ct Head Wo Contrast  07/28/2015  CLINICAL DATA:  Slurred speech.  Diabetes and hypertension EXAM: CT HEAD WITHOUT CONTRAST TECHNIQUE: Contiguous axial images were obtained from the base of the skull through the vertex without intravenous contrast. COMPARISON:  CT head 09/27/2014.  MRI 10/19/2014 FINDINGS: Mild atrophy for age. Negative for hydrocephalus. Low density throughout the cerebral white matter bilaterally unchanged and consistent with chronic ischemia. Small chronic lacunar infarct in the left caudate. Chronic infarct right thalamus.  No acute infarct 5 mm density right posterior frontal cortex most likely an area of chronic hemorrhage. Multiple areas of chronic hemorrhage in the brain based on MRI. Negative for acute hemorrhage.  Negative for acute infarct or mass. Negative calvarium. IMPRESSION: Atrophy and chronic microvascular ischemia. Multiple areas of chronic hemorrhage in the brain based on prior MRI. No acute abnormality. Electronically Signed   By: Marlan Palauharles  Clark M.D.   On: 07/28/2015 18:21    Microbiology: Recent Results (from the past 240 hour(s))  MRSA PCR Screening     Status: None   Collection Time: 07/29/15  1:28 AM  Result Value Ref Range Status   MRSA by PCR NEGATIVE NEGATIVE Final    Comment:        The GeneXpert MRSA Assay (FDA approved for NASAL specimens only), is one component of a comprehensive MRSA colonization surveillance program. It is not intended to diagnose MRSA infection nor to guide or monitor treatment for MRSA infections.      Labs: Basic Metabolic Panel:  Recent Labs Lab 07/28/15 1850 07/28/15 2345 07/29/15 0525 07/30/15 0424 07/31/15 0514 08/01/15 0514  NA 136  --  138 138 138 137  K 3.4*  --  3.4* 3.1* 3.8 4.3  CL 107  --  104 103 97* 107  CO2 22  --  22 25 19* 21*  GLUCOSE 213*  --  293* 111* 95 71  BUN 17  --  12 19 19  22*  CREATININE 1.21* 0.97 1.06* 1.29* 1.48* 1.20*  CALCIUM 8.1*  --  9.2 9.1 10.2 9.4  MG 1.8  --  2.7* 2.1 1.9 1.8  PHOS  --   --  3.0 3.2 3.8 4.5   Liver Function Tests:  Recent Labs Lab 07/28/15 1850  AST 20  ALT 15  ALKPHOS 89  BILITOT 0.4  PROT 6.4*  ALBUMIN 3.6   No results for input(s): LIPASE, AMYLASE in the last 168 hours. No results for input(s): AMMONIA in the last 168 hours. CBC:  Recent Labs Lab 07/28/15 1744 07/28/15 2345 07/30/15 0424 07/31/15 0514 08/01/15 0810  WBC 8.7 9.7 11.2* 13.6* 12.0*  HGB 13.3 14.5 13.4 15.0 14.0  HCT 40.0 43.7 41.7 45.9 41.9  MCV 80.2 81.4 82.7 82.6 82.2  PLT 173 169 194 198 178     Cardiac Enzymes:  Recent Labs Lab 07/28/15 1744 07/28/15 2345 07/29/15 0525 07/30/15 0424  TROPONINI <0.03 <0.03 0.03* 0.06*   BNP: BNP (last 3 results)  No results for input(s): BNP in the last 8760 hours.  ProBNP (last 3 results) No results for input(s): PROBNP in the last 8760 hours.  CBG:  Recent Labs Lab 07/31/15 1212 07/31/15 1539 07/31/15 2137 07/31/15 2225 08/01/15 0821  GLUCAP 153* 126* 67 93 87       Signed:  Caycee Wanat MD, PhD  [facility] 08/01/2015, 11:51 AM

## 2015-08-02 ENCOUNTER — Telehealth: Payer: Self-pay | Admitting: Behavioral Health

## 2015-08-02 ENCOUNTER — Encounter: Payer: Self-pay | Admitting: Family

## 2015-08-02 ENCOUNTER — Telehealth: Payer: Self-pay | Admitting: Family

## 2015-08-02 ENCOUNTER — Telehealth: Payer: Self-pay | Admitting: Family Medicine

## 2015-08-02 DIAGNOSIS — I619 Nontraumatic intracerebral hemorrhage, unspecified: Secondary | ICD-10-CM

## 2015-08-02 NOTE — Telephone Encounter (Signed)
Caller name: Harvie HeckRandy  Relation to AV:WUJWJXpt:spouse  Call back [phone] Pharmacy:  Reason for call:  Spouse states patient just got out of the hospital and was informed to call and speak with Selena BattenKim regarding assistance with medication. Spouse unaware of the medication names. Please advise

## 2015-08-02 NOTE — Telephone Encounter (Signed)
Attempted to reach patient for hospital follow-up call. Left message for patient to return call when available.  

## 2015-08-02 NOTE — Telephone Encounter (Signed)
Please arrange a 1 week hospital follow up visit with Dr. Laury AxonLowne.

## 2015-08-03 NOTE — Telephone Encounter (Signed)
Discussed with husband and I checked the [facility] dollar list at [facility] and everything is on the 4 dollar list except the Insulin, I made him aware to check the price when he gets to the pharmacy and if too expensive let us know and we can see what we can work out for them. He verbalized understanding and has agreed.    KP

## 2015-08-03 NOTE — Telephone Encounter (Signed)
I think her request is reasonable but she may need an appt to document need. I know she was just seen so would wait for PMD to see if she feels that visit can justify the referral or if she needs to see her.

## 2015-08-03 NOTE — Telephone Encounter (Signed)
The patient is requesting PT, OT, speech Therapy due to lack of improvement from previous stroke. Please advise    KP

## 2015-08-04 ENCOUNTER — Telehealth: Payer: Self-pay

## 2015-08-04 NOTE — Telephone Encounter (Signed)
[facility]. Please advise    KP

## 2015-08-04 NOTE — Telephone Encounter (Signed)
Call from the pharmacy advising that the Novolin 70/30 was sent by the ED doctor with different directions, one was Dr.Lowne directions and he has his directions as well on the same script. It was prescribed 15 units BID and then the patient went to the ED and it was changed to 20 units BID but on the same Rx. I made the pharmacy aware to keep the directions given by the ED since he was the one to see the patient last, and Dr.Lowne will follow up with the patient. a1c went from 8/4 to 8.8 and last checked by the ED physician.    KP

## 2015-08-04 NOTE — Telephone Encounter (Signed)
FYI to MD.      KP 

## 2015-08-06 NOTE — Telephone Encounter (Signed)
Ok to send referral in but she still needs hosp f/u---  [facility] in [facility]-- to help with meds etc as well.

## 2015-08-07 NOTE — Telephone Encounter (Signed)
Duplicate phone message.    KP

## 2015-08-08 NOTE — Addendum Note (Signed)
Addended by: Arnette NorrisPAYNE, Jamaira Sherk P on: 08/08/2015 07:16 PM   Modules accepted: Orders

## 2015-08-08 NOTE — Telephone Encounter (Signed)
Please scheduled Hospital follow up.    KP

## 2015-08-10 NOTE — Telephone Encounter (Signed)
Patient scheduled  Hospital follow up for 08/14/2015

## 2015-08-14 ENCOUNTER — Inpatient Hospital Stay: Payer: BLUE CROSS/BLUE SHIELD | Admitting: Family Medicine

## 2015-08-14 DIAGNOSIS — Z0289 Encounter for other administrative examinations: Secondary | ICD-10-CM

## 2015-08-16 ENCOUNTER — Other Ambulatory Visit: Payer: Self-pay | Admitting: Family Medicine

## 2015-08-16 DIAGNOSIS — I619 Nontraumatic intracerebral hemorrhage, unspecified: Secondary | ICD-10-CM

## 2015-08-16 NOTE — Telephone Encounter (Signed)
Please contact pt re: unread mychart message. 

## 2015-08-16 NOTE — Telephone Encounter (Signed)
Called patient. Left message for patient to return call regarding My Chart message..Marland Kitchen

## 2015-08-18 ENCOUNTER — Encounter: Payer: Self-pay | Admitting: Family Medicine

## 2015-08-18 ENCOUNTER — Ambulatory Visit (INDEPENDENT_AMBULATORY_CARE_PROVIDER_SITE_OTHER): Payer: BLUE CROSS/BLUE SHIELD | Admitting: Family Medicine

## 2015-08-18 VITALS — BP 158/100 | HR 75 | Temp 97.9°F | Ht 70.0 in | Wt 194.1 lb

## 2015-08-18 DIAGNOSIS — E1159 Type 2 diabetes mellitus with other circulatory complications: Secondary | ICD-10-CM

## 2015-08-18 DIAGNOSIS — IMO0002 Reserved for concepts with insufficient information to code with codable children: Secondary | ICD-10-CM

## 2015-08-18 DIAGNOSIS — E785 Hyperlipidemia, unspecified: Secondary | ICD-10-CM | POA: Diagnosis not present

## 2015-08-18 DIAGNOSIS — E1151 Type 2 diabetes mellitus with diabetic peripheral angiopathy without gangrene: Secondary | ICD-10-CM

## 2015-08-18 DIAGNOSIS — R55 Syncope and collapse: Secondary | ICD-10-CM

## 2015-08-18 DIAGNOSIS — I161 Hypertensive emergency: Secondary | ICD-10-CM

## 2015-08-18 DIAGNOSIS — I10 Essential (primary) hypertension: Secondary | ICD-10-CM | POA: Diagnosis not present

## 2015-08-18 DIAGNOSIS — E1165 Type 2 diabetes mellitus with hyperglycemia: Secondary | ICD-10-CM

## 2015-08-18 LAB — CBC WITH DIFFERENTIAL/PLATELET
BASOS ABS: 0 10*3/uL (ref 0.0–0.1)
Basophils Relative: 0.2 % (ref 0.0–3.0)
EOS ABS: 0.2 10*3/uL (ref 0.0–0.7)
EOS PCT: 2.6 % (ref 0.0–5.0)
HCT: 35.5 % — ABNORMAL LOW (ref 36.0–46.0)
HEMOGLOBIN: 11.6 g/dL — AB (ref 12.0–15.0)
LYMPHS ABS: 1.5 10*3/uL (ref 0.7–4.0)
Lymphocytes Relative: 18.6 % (ref 12.0–46.0)
MCHC: 32.6 g/dL (ref 30.0–36.0)
MCV: 82.2 fl (ref 78.0–100.0)
MONO ABS: 0.5 10*3/uL (ref 0.1–1.0)
Monocytes Relative: 5.9 % (ref 3.0–12.0)
NEUTROS PCT: 72.7 % (ref 43.0–77.0)
Neutro Abs: 5.8 10*3/uL (ref 1.4–7.7)
Platelets: 200 10*3/uL (ref 150.0–400.0)
RBC: 4.32 Mil/uL (ref 3.87–5.11)
RDW: 14 % (ref 11.5–15.5)
WBC: 8 10*3/uL (ref 4.0–10.5)

## 2015-08-18 LAB — BASIC METABOLIC PANEL
BUN: 21 mg/dL (ref 6–23)
CALCIUM: 9.4 mg/dL (ref 8.4–10.5)
CO2: 27 mEq/L (ref 19–32)
CREATININE: 1.51 mg/dL — AB (ref 0.40–1.20)
Chloride: 106 mEq/L (ref 96–112)
GFR: 47.44 mL/min — AB (ref >60.00)
GLUCOSE: 75 mg/dL (ref 70–99)
POTASSIUM: 3.7 meq/L (ref 3.5–5.1)
Sodium: 141 mEq/L (ref 135–145)

## 2015-08-18 MED ORDER — CARVEDILOL 12.5 MG PO TABS: 12.5000 mg | ORAL_TABLET | Freq: Two times a day (BID) | ORAL | Status: DC

## 2015-08-18 MED ORDER — BENAZEPRIL HCL 40 MG PO TABS: 40.0000 mg | ORAL_TABLET | Freq: Every day | ORAL | Status: DC

## 2015-08-18 NOTE — Telephone Encounter (Signed)
Patient's Husband has bene made aware and verbalized understanding.  No questions or concerns at this time.    KP

## 2015-08-18 NOTE — Progress Notes (Signed)
Patient ID: Stephanie Lloyd, female    DOB: 10/03/1970  Age: 45 y.o. 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  She was admitted 6/30 - 7/4.  Despite being on meds on $4 list pt stopped meds --- she was d/c on coreg and lisinopril and thinks she passed out so she stopped all bp meds.  No cp, no sob, no headaches or blurry vision.    Review of Systems  Constitutional: Negative for diaphoresis, appetite change, fatigue and unexpected weight change.  Eyes: Negative for pain, redness and visual disturbance.  Respiratory: Negative for cough, chest tightness, shortness of breath and wheezing.   Cardiovascular: Negative for chest pain, palpitations and leg swelling.  Endocrine: Negative for cold intolerance, heat intolerance, polydipsia, polyphagia and polyuria.  Genitourinary: Negative for dysuria, frequency and difficulty urinating.  Neurological: Negative for dizziness, light-headedness, numbness and headaches.    History Past Medical History  Diagnosis Date  . Immune deficiency disorder (HCC)   . Diabetes (HCC)   . Diabetes mellitus without complication (HCC)   . Hypertension   . Stroke [facility])     She has no past surgical history on file.   Her family history is not on file.She reports that she has never smoked. She has never used smokeless tobacco. She reports that she does not drink alcohol or use illicit drugs.  No current outpatient prescriptions on file prior to visit.   No current facility-administered medications on file prior to visit.     Objective:  Objective Physical Exam  Constitutional: She is oriented to person, place, and time. She appears well-developed and well-nourished.  HENT:  Head: Normocephalic and atraumatic.  Eyes: Conjunctivae and EOM are normal.  Neck: Normal range of motion. Neck supple. No JVD present. Carotid bruit is not present. No  thyromegaly present.  Cardiovascular: Normal rate, regular rhythm and normal heart sounds.   No murmur heard. Pulmonary/Chest: Effort normal and breath sounds normal. No respiratory distress. She has no wheezes. She has no rales. She exhibits no tenderness.  Musculoskeletal: She exhibits no edema.  Neurological: She is alert and oriented to person, place, and time.  Psychiatric: She has a normal mood and affect. Her behavior is normal. Judgment and thought content normal.  Nursing note and vitals reviewed.  BP 158/100 mmHg  Pulse 75  Temp(Src) 97.9 F (36.6 C) (Oral)  Ht  (1.778 m)  Wt 194 lb 2 oz (88.055 kg)  BMI 27.85 kg/m2  SpO2 97% Wt Readings from Last 3 Encounters:  08/18/15 194 lb 2 oz (88.055 kg)  08/01/15 194 lb 3.2 oz (88.089 kg)  07/28/15 193 lb 9.6 oz (87.816 kg)     Lab Results  Component Value Date   WBC 8.0 08/18/2015   HGB 11.6* 08/18/2015   HCT 35.5* 08/18/2015   PLT 200.0 08/18/2015   GLUCOSE 75 08/18/2015   CHOL 255* 05/23/2015   TRIG 110.0 05/23/2015   HDL 68.80 05/23/2015   LDLCALC 164* 05/23/2015   ALT 15 07/28/2015   AST 20 07/28/2015   NA 141 08/18/2015   K 3.7 08/18/2015   CL 106 08/18/2015   CREATININE 1.51* 08/18/2015   BUN 21 08/18/2015   CO2 27 08/18/2015   TSH 0.548 06/23/2014   INR 1.12 09/27/2014   HGBA1C 8.8* 07/31/2015    Dg Chest 2 View  07/28/2015  CLINICAL DATA:  Left arm pain. EXAM: CHEST  2 VIEW COMPARISON:  None. FINDINGS: Blunting of the left costophrenic angle on the lateral view suggests small effusion. No other abnormalities. IMPRESSION: Suggested tiny left pleural effusion on the lateral view. No other abnormalities. Electronically Signed   By: Gerome Sam III M.D   On: 07/28/2015 18:22   Ct Head Wo Contrast  07/28/2015  CLINICAL DATA:  Slurred speech.  Diabetes and hypertension EXAM: CT HEAD WITHOUT CONTRAST TECHNIQUE: Contiguous axial images were obtained from the base of the skull through the vertex without  intravenous contrast. COMPARISON:  CT head 09/27/2014.  MRI 10/19/2014 FINDINGS: Mild atrophy for age. Negative for hydrocephalus. Low density throughout the cerebral white matter bilaterally unchanged and consistent with chronic ischemia. Small chronic lacunar infarct in the left caudate. Chronic infarct right thalamus. No acute infarct 5 mm density right posterior frontal cortex most likely an area of chronic hemorrhage. Multiple areas of chronic hemorrhage in the brain based on MRI. Negative for acute hemorrhage.  Negative for acute infarct or mass. Negative calvarium. IMPRESSION: Atrophy and chronic microvascular ischemia. Multiple areas of chronic hemorrhage in the brain based on prior MRI. No acute abnormality. Electronically Signed   By: Marlan Palau M.D.   On: 07/28/2015 18:21     Assessment & Plan:  Plan I am having [name] maintain her carvedilol, benazepril, lovastatin, and insulin NPH-regular Human.  Meds ordered this encounter  Medications  . carvedilol (COREG) 12.5 MG tablet    Sig: Take 1 tablet (12.5 mg total) by mouth 2 (two) times daily with a meal.    Dispense:  60 tablet    Refill:  1  . benazepril (LOTENSIN) 40 MG tablet    Sig: Take 1 tablet (40 mg total) by mouth daily.    Dispense:  30 tablet    Refill:  0  . lovastatin (MEVACOR) 20 MG tablet    Sig: Take 2 tablets (40 mg total) by mouth at bedtime.    Dispense:  60 tablet    Refill:  1    Please fill the blister packs on the $4 dollar list  . insulin NPH-regular Human (NOVOLIN 70/30 RELION) (70-30) 100 UNIT/ML injection    Sig: Inject 15 Units into the skin 2 (two) times daily with a meal. 20 units SubQ bid 30-45 min before meals    Dispense:  10 mL    Refill:  1    Problem List Items Addressed This Visit      Unprioritized   Hyperlipidemia LDL goal <70   Relevant Medications   carvedilol (COREG) 12.5 MG tablet   benazepril (LOTENSIN) 40 MG tablet   lovastatin (MEVACOR) 20 MG tablet   Hypertensive  emergency    Pt again stopped all meds --- d/w her and her husband the importance of not stopping her meds Restart coreg after a few days if no problems start benazapril  F/u 2-3 weeks or sooner prn      Relevant Medications   carvedilol (COREG) 12.5 MG tablet   benazepril (LOTENSIN) 40 MG tablet   lovastatin (MEVACOR) 20 MG tablet   Syncope    If occurs again , advised to go back to hospital       Relevant Medications   carvedilol (COREG) 12.5 MG tablet   benazepril (LOTENSIN) 40 MG tablet   lovastatin (MEVACOR) 20 MG tablet   Other Relevant Orders   US Carotid Duplex Bilateral   Type 2 diabetes mellitus with other circulatory complications (HCC)  con't insulin Recheck labs 3 months      Relevant Medications   benazepril (LOTENSIN) 40 MG tablet   lovastatin (MEVACOR) 20 MG tablet   insulin NPH-regular Human (NOVOLIN 70/30 RELION) (70-30) 100 UNIT/ML injection    Other Visit Diagnoses    Essential hypertension    -  Primary    Relevant Medications    carvedilol (COREG) 12.5 MG tablet    benazepril (LOTENSIN) 40 MG tablet    lovastatin (MEVACOR) 20 MG tablet    Other Relevant Orders    Basic metabolic panel (Completed)    CBC with Differential/Platelet (Completed)    DM (diabetes mellitus) type II uncontrolled, periph vascular disorder (HCC)        Relevant Medications    carvedilol (COREG) 12.5 MG tablet    benazepril (LOTENSIN) 40 MG tablet    lovastatin (MEVACOR) 20 MG tablet    insulin NPH-regular Human (NOVOLIN 70/30 RELION) (70-30) 100 UNIT/ML injection    Other Relevant Orders    Basic metabolic panel (Completed)    CBC with Differential/Platelet (Completed)       Follow-up: Return in about 2 weeks (around 09/01/2015), or if symptoms worsen or fail to improve, for hypertension.  Donato SchultzYvonne R [name], DO

## 2015-08-18 NOTE — Telephone Encounter (Signed)
Forms given back to Pt at today's office visit w/ Dr. Laury AxonLowne, forms no longer needed.

## 2015-08-18 NOTE — Patient Instructions (Signed)
Hypertension Hypertension, commonly called high blood pressure, is when the force of blood pumping through your arteries is too strong. Your arteries are the blood vessels that carry blood from your heart throughout your body. A blood pressure reading consists of a higher number over a lower number, such as 110/72. The higher number (systolic) is the pressure inside your arteries when your heart pumps. The lower number (diastolic) is the pressure inside your arteries when your heart relaxes. Ideally you want your blood pressure below 120/80. Hypertension forces your heart to work harder to pump blood. Your arteries may become narrow or stiff. Having untreated or uncontrolled hypertension can cause heart attack, stroke, kidney disease, and other problems. RISK FACTORS Some risk factors for high blood pressure are controllable. Others are not.  Risk factors you cannot control include:   Race. You may be at higher risk if you are African American.  Age. Risk increases with age.  Gender. Men are at higher risk than women before age 45 years. After age 65, women are at higher risk than men. Risk factors you can control include:  Not getting enough exercise or physical activity.  Being overweight.  Getting too much fat, sugar, calories, or salt in your diet.  Drinking too much alcohol. SIGNS AND SYMPTOMS Hypertension does not usually cause signs or symptoms. Extremely high blood pressure (hypertensive crisis) may cause headache, anxiety, shortness of breath, and nosebleed. DIAGNOSIS To check if you have hypertension, your health care provider will measure your blood pressure while you are seated, with your arm held at the level of your heart. It should be measured at least twice using the same arm. Certain conditions can cause a difference in blood pressure between your right and left arms. A blood pressure reading that is higher than normal on one occasion does not mean that you need treatment. If  it is not clear whether you have high blood pressure, you may be asked to return on a different day to have your blood pressure checked again. Or, you may be asked to monitor your blood pressure at home for 1 or more weeks. TREATMENT Treating high blood pressure includes making lifestyle changes and possibly taking medicine. Living a healthy lifestyle can help lower high blood pressure. You may need to change some of your habits. Lifestyle changes may include:  Following the DASH diet. This diet is high in fruits, vegetables, and whole grains. It is low in salt, red meat, and added sugars.  Keep your sodium intake below 2,300 mg per day.  Getting at least 30-45 minutes of aerobic exercise at least 4 times per week.  Losing weight if necessary.  Not smoking.  Limiting alcoholic beverages.  Learning ways to reduce stress. Your health care provider may prescribe medicine if lifestyle changes are not enough to get your blood pressure under control, and if one of the following is true:  You are 18-59 years of age and your systolic blood pressure is above 140.  You are 60 years of age or older, and your systolic blood pressure is above 150.  Your diastolic blood pressure is above 90.  You have diabetes, and your systolic blood pressure is over 140 or your diastolic blood pressure is over 90.  You have kidney disease and your blood pressure is above 140/90.  You have heart disease and your blood pressure is above 140/90. Your personal target blood pressure may vary depending on your medical conditions, your age, and other factors. HOME CARE INSTRUCTIONS    Have your blood pressure rechecked as directed by your health care provider.   Take medicines only as directed by your health care provider. Follow the directions carefully. Blood pressure medicines must be taken as prescribed. The medicine does not work as well when you skip doses. Skipping doses also puts you at risk for  problems.  Do not smoke.   Monitor your blood pressure at home as directed by your health care provider. SEEK MEDICAL CARE IF:   You think you are having a reaction to medicines taken.  You have recurrent headaches or feel dizzy.  You have swelling in your ankles.  You have trouble with your vision. SEEK IMMEDIATE MEDICAL CARE IF:  You develop a severe headache or confusion.  You have unusual weakness, numbness, or feel faint.  You have severe chest or abdominal pain.  You vomit repeatedly.  You have trouble breathing. MAKE SURE YOU:   Understand these instructions.  Will watch your condition.  Will get help right away if you are not doing well or get worse.   This information is not intended to replace advice given to you by your health care provider. Make sure you discuss any questions you have with your health care provider.   Document Released: 01/14/2005 Document Revised: 05/31/2014 Document Reviewed: 11/06/2012 Elsevier Interactive Patient Education 2016 Elsevier Inc.  

## 2015-08-18 NOTE — Progress Notes (Signed)
Pre visit review using our clinic review tool, if applicable. No additional management support is needed unless otherwise documented below in the visit note. 

## 2015-08-19 DIAGNOSIS — R55 Syncope and collapse: Secondary | ICD-10-CM | POA: Insufficient documentation

## 2015-08-19 MED ORDER — INSULIN NPH ISOPHANE & REGULAR (70-30) 100 UNIT/ML ~~LOC~~ SUSP: 15.0000 [IU] | Freq: Two times a day (BID) | SUBCUTANEOUS | Status: DC

## 2015-08-19 MED ORDER — LOVASTATIN 20 MG PO TABS: 40.0000 mg | ORAL_TABLET | Freq: Every day | ORAL | Status: DC

## 2015-08-19 NOTE — Assessment & Plan Note (Signed)
con't insulin Recheck labs 3 months

## 2015-08-19 NOTE — Assessment & Plan Note (Signed)
Pt again stopped all meds --- d/w her and her husband the importance of not stopping her meds Restart coreg after a few days if no problems start benazapril  F/u 2-3 weeks or sooner prn

## 2015-08-19 NOTE — Assessment & Plan Note (Signed)
If occurs again , advised to go back to hospital

## 2015-08-21 ENCOUNTER — Telehealth: Payer: Self-pay

## 2015-08-21 NOTE — Telephone Encounter (Signed)
thn was not able to help but she gave me this number to try to see if they can do anything to help this pt    ----- Message -----   From: Iverson Alamin   Sent: 08/17/2015  5:04 PM    To: Donato Schultz, DO  Subject: RE: Referral                       The number that I have for the referral Nursing Support/ Healthcare Management is [phone]. I hope this helps.      ----- Message -----   From: Donato Schultz, DO   Sent: 08/17/2015  4:04 PM    To: Iverson Alamin, Ashira De'Chelle Dobison  Subject: RE: Referral                     Do you know of anyone that can help this patient.? We are desperate to get her help. Med compliance is terrible due to finances and she she is has been admitted several times. Any advice would be appreciated.  Thanks  Dr Laury Axon    ----- Message -----   From: Iverson Alamin   Sent: 08/17/2015  2:58 PM    To: Donato Schultz, DO  Subject: Referral                         Thank you for this referral.  This patient is Not eligible for [facility] Care Management Services.     Reason: Not a beneficiary currently attributed to one of the [facility] ACO Registry populations. Membership roster used to verify non- eligible status.    As of right now we do not take [employer]     Iverson Alamin CBCS/CMAA  Care Management Assistant  [facility] Care Management

## 2015-08-25 ENCOUNTER — Telehealth: Payer: Self-pay | Admitting: Family Medicine

## 2015-08-25 ENCOUNTER — Other Ambulatory Visit: Payer: Self-pay

## 2015-08-25 DIAGNOSIS — E1122 Type 2 diabetes mellitus with diabetic chronic kidney disease: Secondary | ICD-10-CM

## 2015-08-25 DIAGNOSIS — R7989 Other specified abnormal findings of blood chemistry: Secondary | ICD-10-CM

## 2015-08-25 MED ORDER — ACCU-CHEK FASTCLIX LANCETS MISC: 12 refills | Status: DC

## 2015-08-25 MED ORDER — GLUCOSE BLOOD VI STRP: ORAL_STRIP | 12 refills | Status: DC

## 2015-08-25 NOTE — Telephone Encounter (Signed)
Information has been given to her husband.      KP

## 2015-08-25 NOTE — Telephone Encounter (Signed)
Discussed with husband, accu-chek guide ordered and a new meter has been left at check in for pick up.     KP

## 2015-08-25 NOTE — Telephone Encounter (Signed)
Message left to call the office.    KP 

## 2015-08-25 NOTE — Telephone Encounter (Signed)
Relation to NI:OEVO Call back number:[phone]  Reason for call:  Spouse inquiring if PCP has any additional glucose meter and the strips and lacets that come with it. Please advise

## 2015-08-31 ENCOUNTER — Ambulatory Visit (HOSPITAL_BASED_OUTPATIENT_CLINIC_OR_DEPARTMENT_OTHER)
Admission: RE | Admit: 2015-08-31 | Discharge: 2015-08-31 | Disposition: A | Discharge status: Home / Self Care | Payer: Medicaid Other | Source: Ambulatory Visit | Attending: Family Medicine | Admitting: Family Medicine

## 2015-08-31 DIAGNOSIS — R55 Syncope and collapse: Secondary | ICD-10-CM

## 2015-08-31 DIAGNOSIS — I6523 Occlusion and stenosis of bilateral carotid arteries: Secondary | ICD-10-CM | POA: Diagnosis not present

## 2015-09-01 ENCOUNTER — Ambulatory Visit: Payer: BLUE CROSS/BLUE SHIELD | Admitting: Family Medicine

## 2015-09-01 DIAGNOSIS — Z0289 Encounter for other administrative examinations: Secondary | ICD-10-CM

## 2015-09-05 ENCOUNTER — Ambulatory Visit (INDEPENDENT_AMBULATORY_CARE_PROVIDER_SITE_OTHER): Payer: Self-pay | Admitting: Family Medicine

## 2015-09-05 ENCOUNTER — Encounter: Payer: Self-pay | Admitting: Family Medicine

## 2015-09-05 VITALS — BP 152/100 | HR 65 | Temp 97.6°F | Ht 70.0 in | Wt 195.0 lb

## 2015-09-05 DIAGNOSIS — E1159 Type 2 diabetes mellitus with other circulatory complications: Secondary | ICD-10-CM

## 2015-09-05 DIAGNOSIS — Z794 Long term (current) use of insulin: Secondary | ICD-10-CM

## 2015-09-05 DIAGNOSIS — E119 Type 2 diabetes mellitus without complications: Secondary | ICD-10-CM

## 2015-09-05 DIAGNOSIS — I10 Essential (primary) hypertension: Secondary | ICD-10-CM

## 2015-09-05 LAB — GLUCOSE, POCT (MANUAL RESULT ENTRY): POC Glucose: 228 mg/dl — AB (ref 70–99)

## 2015-09-05 MED ORDER — CARVEDILOL 25 MG PO TABS: 25.0000 mg | ORAL_TABLET | Freq: Two times a day (BID) | ORAL | 2 refills | Status: DC

## 2015-09-05 NOTE — Patient Instructions (Signed)
Hypertension Hypertension, commonly called high blood pressure, is when the force of blood pumping through your arteries is too strong. Your arteries are the blood vessels that carry blood from your heart throughout your body. A blood pressure reading consists of a higher number over a lower number, such as 110/72. The higher number (systolic) is the pressure inside your arteries when your heart pumps. The lower number (diastolic) is the pressure inside your arteries when your heart relaxes. Ideally you want your blood pressure below 120/80. Hypertension forces your heart to work harder to pump blood. Your arteries may become narrow or stiff. Having untreated or uncontrolled hypertension can cause heart attack, stroke, kidney disease, and other problems. RISK FACTORS Some risk factors for high blood pressure are controllable. Others are not.  Risk factors you cannot control include:   Race. You may be at higher risk if you are African American.  Age. Risk increases with age.  Gender. Men are at higher risk than women before age 45 years. After age 65, women are at higher risk than men. Risk factors you can control include:  Not getting enough exercise or physical activity.  Being overweight.  Getting too much fat, sugar, calories, or salt in your diet.  Drinking too much alcohol. SIGNS AND SYMPTOMS Hypertension does not usually cause signs or symptoms. Extremely high blood pressure (hypertensive crisis) may cause headache, anxiety, shortness of breath, and nosebleed. DIAGNOSIS To check if you have hypertension, your health care provider will measure your blood pressure while you are seated, with your arm held at the level of your heart. It should be measured at least twice using the same arm. Certain conditions can cause a difference in blood pressure between your right and left arms. A blood pressure reading that is higher than normal on one occasion does not mean that you need treatment. If  it is not clear whether you have high blood pressure, you may be asked to return on a different day to have your blood pressure checked again. Or, you may be asked to monitor your blood pressure at home for 1 or more weeks. TREATMENT Treating high blood pressure includes making lifestyle changes and possibly taking medicine. Living a healthy lifestyle can help lower high blood pressure. You may need to change some of your habits. Lifestyle changes may include:  Following the DASH diet. This diet is high in fruits, vegetables, and whole grains. It is low in salt, red meat, and added sugars.  Keep your sodium intake below 2,300 mg per day.  Getting at least 30-45 minutes of aerobic exercise at least 4 times per week.  Losing weight if necessary.  Not smoking.  Limiting alcoholic beverages.  Learning ways to reduce stress. Your health care provider may prescribe medicine if lifestyle changes are not enough to get your blood pressure under control, and if one of the following is true:  You are 18-59 years of age and your systolic blood pressure is above 140.  You are 60 years of age or older, and your systolic blood pressure is above 150.  Your diastolic blood pressure is above 90.  You have diabetes, and your systolic blood pressure is over 140 or your diastolic blood pressure is over 90.  You have kidney disease and your blood pressure is above 140/90.  You have heart disease and your blood pressure is above 140/90. Your personal target blood pressure may vary depending on your medical conditions, your age, and other factors. HOME CARE INSTRUCTIONS    Have your blood pressure rechecked as directed by your health care provider.   Take medicines only as directed by your health care provider. Follow the directions carefully. Blood pressure medicines must be taken as prescribed. The medicine does not work as well when you skip doses. Skipping doses also puts you at risk for  problems.  Do not smoke.   Monitor your blood pressure at home as directed by your health care provider. SEEK MEDICAL CARE IF:   You think you are having a reaction to medicines taken.  You have recurrent headaches or feel dizzy.  You have swelling in your ankles.  You have trouble with your vision. SEEK IMMEDIATE MEDICAL CARE IF:  You develop a severe headache or confusion.  You have unusual weakness, numbness, or feel faint.  You have severe chest or abdominal pain.  You vomit repeatedly.  You have trouble breathing. MAKE SURE YOU:   Understand these instructions.  Will watch your condition.  Will get help right away if you are not doing well or get worse.   This information is not intended to replace advice given to you by your health care provider. Make sure you discuss any questions you have with your health care provider.   Document Released: 01/14/2005 Document Revised: 05/31/2014 Document Reviewed: 11/06/2012 Elsevier Interactive Patient Education 2016 Elsevier Inc.  

## 2015-09-05 NOTE — Progress Notes (Signed)
Patient ID: Stephanie ShirtsMarva N Detore, female    DOB: 03/22/1970  Age: 45 y.o. MRN: [id number]    Subjective:  Subjective  HPI Stephanie Lloyd presents for f/u bp.  It has been running high at home.  No complaints.   She has also not been taking her insulin because she did not have strips to check her bs.     Review of Systems  Constitutional: Negative for appetite change, diaphoresis, fatigue and unexpected weight change.  Eyes: Negative for pain, redness and visual disturbance.  Respiratory: Negative for cough, chest tightness, shortness of breath and wheezing.   Cardiovascular: Negative for chest pain, palpitations and leg swelling.  Endocrine: Negative for cold intolerance, heat intolerance, polydipsia, polyphagia and polyuria.  Genitourinary: Negative for difficulty urinating, dysuria and frequency.  Neurological: Negative for dizziness, light-headedness, numbness and headaches.    History Past Medical History:  Diagnosis Date  . Diabetes (HCC)   . Diabetes mellitus without complication (HCC)   . Hypertension   . Immune deficiency disorder (HCC)   . Stroke [facility](HCC)     She has no past surgical history on file.   Her family history is not on file.She reports that she has never smoked. She has never used smokeless tobacco. She reports that she does not drink alcohol or use drugs.  Current Outpatient Prescriptions on File Prior to Visit  Medication Sig Dispense Refill  . ACCU-CHEK FASTCLIX LANCETS MISC Check blood sugar twice daily. Dx E:11.9 102 each 12  . benazepril (LOTENSIN) 40 MG tablet Take 1 tablet (40 mg total) by mouth daily. 30 tablet 0  . glucose blood (ACCU-CHEK GUIDE) test strip Check blood sugar twice daily. Dx E:11.9 100 each 12  . lovastatin (MEVACOR) 20 MG tablet Take 2 tablets (40 mg total) by mouth at bedtime. 60 tablet 1  . insulin NPH-regular Human (NOVOLIN 70/30 RELION) (70-30) 100 UNIT/ML injection Inject 15 Units into the skin 2 (two) times daily with a meal. 20  units SubQ bid 30-45 min before meals (Patient not taking: Reported on 09/05/2015) 10 mL 1   No current facility-administered medications on file prior to visit.      Objective:  Objective  Physical Exam  Constitutional: She is oriented to person, place, and time. She appears well-developed and well-nourished.  HENT:  Head: Normocephalic and atraumatic.  Eyes: Conjunctivae and EOM are normal.  Neck: Normal range of motion. Neck supple. No JVD present. Carotid bruit is not present. No thyromegaly present.  Cardiovascular: Normal rate, regular rhythm and normal heart sounds.   No murmur heard. Pulmonary/Chest: Effort normal and breath sounds normal. No respiratory distress. She has no wheezes. She has no rales. She exhibits no tenderness.  Musculoskeletal: She exhibits no edema.  Neurological: She is alert and oriented to person, place, and time.  Psychiatric: She has a normal mood and affect. Her behavior is normal. Judgment and thought content normal.  Nursing note and vitals reviewed.  BP (!) 152/100 (BP Location: Left Arm, Patient Position: Sitting, Cuff Size: Normal)   Pulse 65   Temp 97.6 F (36.4 C) (Oral)   Ht 5\' 10"  (1.778 m)   Wt 195 lb (88.5 kg)   SpO2 99%   BMI 27.98 kg/m  Wt Readings from Last 3 Encounters:  09/05/15 195 lb (88.5 kg)  08/18/15 194 lb 2 oz (88.1 kg)  08/01/15 194 lb 3.2 oz (88.1 kg)     Lab Results  Component Value Date   WBC 8.0 08/18/2015  HGB 11.6 (L) 08/18/2015   HCT 35.5 (L) 08/18/2015   PLT 200.0 08/18/2015   GLUCOSE 75 08/18/2015   CHOL 255 (H) 05/23/2015   TRIG 110.0 05/23/2015   HDL 68.80 05/23/2015   LDLCALC 164 (H) 05/23/2015   ALT 15 07/28/2015   AST 20 07/28/2015   NA 141 08/18/2015   K 3.7 08/18/2015   CL 106 08/18/2015   CREATININE 1.51 (H) 08/18/2015   BUN 21 08/18/2015   CO2 27 08/18/2015   TSH 0.548 06/23/2014   INR 1.12 09/27/2014   HGBA1C 8.8 (H) 07/31/2015    US Carotid Duplex Bilateral  Result Date:  09/01/2015 CLINICAL DATA:  Syncopal episode 1-2 weeks ago. History of stroke 1-2 years ago. History of hypertension and diabetes. EXAM: BILATERAL CAROTID DUPLEX ULTRASOUND TECHNIQUE: Wallace Cullens scale imaging, color Doppler and duplex ultrasound were performed of bilateral carotid and vertebral arteries in the neck. COMPARISON:  Head CT - 07/28/2015 FINDINGS: Criteria: Quantification of carotid stenosis is based on velocity parameters that correlate the residual internal carotid diameter with NASCET-based stenosis levels, using the diameter of the distal internal carotid lumen as the denominator for stenosis measurement. The following velocity measurements were obtained: RIGHT ICA:  72/26 cm/sec CCA:  78/15 cm/sec SYSTOLIC ICA/CCA RATIO:  0.9 DIASTOLIC ICA/CCA RATIO:  1.8 ECA:  75 cm/sec LEFT ICA:  59/19 cm/sec CCA:  78/15 cm/sec SYSTOLIC ICA/CCA RATIO:  0.8 DIASTOLIC ICA/CCA RATIO:  1.3 ECA:  60 cm/sec RIGHT CAROTID ARTERY: There is a minimal amount of eccentric hypoechoic plaque within the right carotid bulb (image 16 and 17), not resulting in elevated peak systolic velocities within the interrogated course of the right internal carotid artery to suggest a hemodynamically significant stenosis. RIGHT VERTEBRAL ARTERY:  Antegrade Flow LEFT CAROTID ARTERY: There is a minimal amount of crescentic hypoechoic plaque within the mid aspect the left common carotid artery (image 42). There is a minimal amount of intimal thickening involving the origin and proximal aspects of the left internal carotid artery (image 57), not resulting in elevated peak systolic velocities within the interrogated course of the left internal carotid artery to suggest a hemodynamically significant stenosis. LEFT VERTEBRAL ARTERY:  Antegrade Flow IMPRESSION: Minimal amount of bilateral intimal wall thickening and atherosclerotic plaque, not resulting in a hemodynamically significant stenosis within either internal carotid artery. Electronically Signed    By: Simonne Come M.D.   On: 09/01/2015 10:51     Assessment & Plan:  Plan  I have discontinued Ms. Crisafulli's carvedilol. I am also having her start on carvedilol. Additionally, I am having her maintain her benazepril, lovastatin, insulin NPH-regular Human, glucose blood, and ACCU-CHEK FASTCLIX LANCETS.  Meds ordered this encounter  Medications  . carvedilol (COREG) 25 MG tablet    Sig: Take 1 tablet (25 mg total) by mouth 2 (two) times daily with a meal.    Dispense:  60 tablet    Refill:  2    Problem List Items Addressed This Visit      Unprioritized   Diabetes mellitus (HCC) - Primary   Relevant Orders   POCT Glucose (CBG) (Completed)   Hypertension   Relevant Medications   carvedilol (COREG) 25 MG tablet    Other Visit Diagnoses   None.     Follow-up: Return in about 3 weeks (around 09/26/2015) for hypertension.  Donato Schultz, DO

## 2015-09-06 NOTE — Assessment & Plan Note (Signed)
Pt given some lancets to use Encouraged her to check regularly and take her meds

## 2015-09-06 NOTE — Assessment & Plan Note (Signed)
[employer] coreg to 25 bid con't other meds rto 2-3 weeks

## 2015-09-07 ENCOUNTER — Telehealth: Payer: Self-pay

## 2015-09-07 NOTE — Telephone Encounter (Signed)
Message left to call the office.    KP 

## 2015-09-07 NOTE — Telephone Encounter (Signed)
Patient's spouse is requesting a call back to clarify medication changes at last visit. Please call at  [phone]

## 2015-09-08 MED ORDER — ACCU-CHEK FASTCLIX LANCETS MISC: 12 refills | Status: DC

## 2015-09-08 MED ORDER — ACCU-CHEK GUIDE W/DEVICE KIT: 1.0000 | PACK | 0 refills | Status: DC

## 2015-09-08 MED ORDER — GLUCOSE BLOOD VI STRP: ORAL_STRIP | 12 refills | Status: DC

## 2015-09-08 NOTE — Telephone Encounter (Signed)
Discussed with husband and he has been made aware that the Coreg was increased to 25 mg 1 bid. He verbalized understanding, and asked for me to send the accu-chek to the pharmacy again. Rx faxed.    KP

## 2015-09-25 ENCOUNTER — Telehealth: Payer: Self-pay | Admitting: Family Medicine

## 2015-09-25 NOTE — Telephone Encounter (Signed)
Pt's Spouse dropped off paperwork to be filled out and wanted to be mailed. (Document Claim Form Disability Insurance Benefit) -Document put at front office tray.

## 2015-09-26 ENCOUNTER — Ambulatory Visit: Payer: Self-pay | Admitting: Family Medicine

## 2015-10-08 ENCOUNTER — Inpatient Hospital Stay (HOSPITAL_COMMUNITY)
Admission: EM | Admit: 2015-10-08 | Discharge: 2015-10-10 | DRG: 065 | Disposition: A | Discharge status: Home / Self Care | Payer: Medicaid Other | Attending: Internal Medicine | Admitting: Internal Medicine

## 2015-10-08 ENCOUNTER — Emergency Department (HOSPITAL_COMMUNITY): Payer: Medicaid Other

## 2015-10-08 ENCOUNTER — Inpatient Hospital Stay (HOSPITAL_COMMUNITY): Payer: Medicaid Other

## 2015-10-08 ENCOUNTER — Encounter (HOSPITAL_COMMUNITY): Payer: Self-pay | Admitting: Emergency Medicine

## 2015-10-08 DIAGNOSIS — E1165 Type 2 diabetes mellitus with hyperglycemia: Secondary | ICD-10-CM | POA: Diagnosis present

## 2015-10-08 DIAGNOSIS — I69354 Hemiplegia and hemiparesis following cerebral infarction affecting left non-dominant side: Secondary | ICD-10-CM | POA: Diagnosis not present

## 2015-10-08 DIAGNOSIS — I160 Hypertensive urgency: Secondary | ICD-10-CM | POA: Diagnosis present

## 2015-10-08 DIAGNOSIS — E119 Type 2 diabetes mellitus without complications: Secondary | ICD-10-CM

## 2015-10-08 DIAGNOSIS — I10 Essential (primary) hypertension: Secondary | ICD-10-CM

## 2015-10-08 DIAGNOSIS — Z888 Allergy status to other drugs, medicaments and biological substances status: Secondary | ICD-10-CM | POA: Diagnosis not present

## 2015-10-08 DIAGNOSIS — E1159 Type 2 diabetes mellitus with other circulatory complications: Secondary | ICD-10-CM

## 2015-10-08 DIAGNOSIS — Z794 Long term (current) use of insulin: Secondary | ICD-10-CM | POA: Diagnosis not present

## 2015-10-08 DIAGNOSIS — G8191 Hemiplegia, unspecified affecting right dominant side: Secondary | ICD-10-CM | POA: Diagnosis present

## 2015-10-08 DIAGNOSIS — I69322 Dysarthria following cerebral infarction: Secondary | ICD-10-CM

## 2015-10-08 DIAGNOSIS — IMO0002 Reserved for concepts with insufficient information to code with codable children: Secondary | ICD-10-CM

## 2015-10-08 DIAGNOSIS — Z9119 Patient's noncompliance with other medical treatment and regimen: Secondary | ICD-10-CM | POA: Diagnosis not present

## 2015-10-08 DIAGNOSIS — Z79899 Other long term (current) drug therapy: Secondary | ICD-10-CM | POA: Diagnosis not present

## 2015-10-08 DIAGNOSIS — R531 Weakness: Secondary | ICD-10-CM

## 2015-10-08 DIAGNOSIS — E785 Hyperlipidemia, unspecified: Secondary | ICD-10-CM

## 2015-10-08 DIAGNOSIS — D849 Immunodeficiency, unspecified: Secondary | ICD-10-CM | POA: Diagnosis present

## 2015-10-08 DIAGNOSIS — Z9114 Patient's other noncompliance with medication regimen: Secondary | ICD-10-CM

## 2015-10-08 DIAGNOSIS — E1151 Type 2 diabetes mellitus with diabetic peripheral angiopathy without gangrene: Secondary | ICD-10-CM

## 2015-10-08 DIAGNOSIS — I159 Secondary hypertension, unspecified: Secondary | ICD-10-CM

## 2015-10-08 DIAGNOSIS — I639 Cerebral infarction, unspecified: Secondary | ICD-10-CM | POA: Diagnosis not present

## 2015-10-08 LAB — BASIC METABOLIC PANEL
Anion gap: 8 (ref 5–15)
BUN: 15 mg/dL (ref 6–20)
CALCIUM: 9.4 mg/dL (ref 8.9–10.3)
CO2: 25 mmol/L (ref 22–32)
CREATININE: 1.15 mg/dL — AB (ref 0.44–1.00)
Chloride: 104 mmol/L (ref 101–111)
GFR, EST NON AFRICAN AMERICAN: 56 mL/min — AB (ref >60)
Glucose, Bld: 246 mg/dL — ABNORMAL HIGH (ref 65–99)
Potassium: 4.1 mmol/L (ref 3.5–5.1)
SODIUM: 137 mmol/L (ref 135–145)

## 2015-10-08 LAB — CBC
HCT: 38.8 % (ref 36.0–46.0)
HEMOGLOBIN: 12.4 g/dL (ref 12.0–15.0)
MCH: 26.5 pg (ref 26.0–34.0)
MCHC: 32 g/dL (ref 30.0–36.0)
MCV: 82.9 fL (ref 78.0–100.0)
Platelets: 190 10*3/uL (ref 150–400)
RBC: 4.68 MIL/uL (ref 3.87–5.11)
RDW: 13.2 % (ref 11.5–15.5)
WBC: 7 10*3/uL (ref 4.0–10.5)

## 2015-10-08 LAB — GLUCOSE, CAPILLARY: Glucose-Capillary: 150 mg/dL — ABNORMAL HIGH (ref 65–99)

## 2015-10-08 MED ORDER — INSULIN ASPART 100 UNIT/ML ~~LOC~~ SOLN
0.0000 [IU] | Freq: Three times a day (TID) | SUBCUTANEOUS | Status: DC
Start: 2015-10-09 — End: 2015-10-10
  Administered 2015-10-09 (×2): 3 [IU] via SUBCUTANEOUS
  Administered 2015-10-09: 2 [IU] via SUBCUTANEOUS
  Administered 2015-10-10: 1 [IU] via SUBCUTANEOUS
  Administered 2015-10-10: 3 [IU] via SUBCUTANEOUS

## 2015-10-08 MED ORDER — ENOXAPARIN SODIUM 40 MG/0.4ML ~~LOC~~ SOLN
40.0000 mg | SUBCUTANEOUS | Status: DC
  Administered 2015-10-08 – 2015-10-09 (×2): 40 mg via SUBCUTANEOUS
  Filled 2015-10-08 (×2): qty 0.4

## 2015-10-08 MED ORDER — STROKE: EARLY STAGES OF RECOVERY BOOK
Freq: Once | Status: DC
  Filled 2015-10-08: qty 1

## 2015-10-08 MED ORDER — CARVEDILOL 12.5 MG PO TABS
25.0000 mg | ORAL_TABLET | Freq: Two times a day (BID) | ORAL | Status: DC
  Administered 2015-10-08 – 2015-10-10 (×5): 25 mg via ORAL
  Filled 2015-10-08 (×5): qty 2

## 2015-10-08 MED ORDER — HYDRALAZINE HCL 20 MG/ML IJ SOLN: 5.0000 mg | Freq: Three times a day (TID) | INTRAMUSCULAR | Status: DC | PRN

## 2015-10-08 MED ORDER — ASPIRIN EC 81 MG PO TBEC
81.0000 mg | DELAYED_RELEASE_TABLET | Freq: Every day | ORAL | Status: DC
  Administered 2015-10-08 – 2015-10-10 (×3): 81 mg via ORAL
  Filled 2015-10-08 (×3): qty 1

## 2015-10-08 MED ORDER — HYDRALAZINE HCL 20 MG/ML IJ SOLN
10.0000 mg | Freq: Once | INTRAMUSCULAR | Status: AC
  Administered 2015-10-08: 10 mg via INTRAVENOUS
  Filled 2015-10-08: qty 1

## 2015-10-08 MED ORDER — SENNOSIDES-DOCUSATE SODIUM 8.6-50 MG PO TABS: 1.0000 | ORAL_TABLET | Freq: Every evening | ORAL | Status: DC | PRN

## 2015-10-08 MED ORDER — SODIUM CHLORIDE 0.9 % IV SOLN
INTRAVENOUS | Status: DC
  Administered 2015-10-08: 22:00:00 via INTRAVENOUS

## 2015-10-08 MED ORDER — HYDRALAZINE HCL 20 MG/ML IJ SOLN: 10.0000 mg | Freq: Once | INTRAMUSCULAR | Status: DC

## 2015-10-08 MED ORDER — INSULIN ASPART 100 UNIT/ML ~~LOC~~ SOLN: 0.0000 [IU] | Freq: Every day | SUBCUTANEOUS | Status: DC

## 2015-10-08 MED ORDER — IOPAMIDOL (ISOVUE-370) INJECTION 76%
INTRAVENOUS | Status: AC
  Administered 2015-10-08: 50 mL
  Filled 2015-10-08: qty 50

## 2015-10-08 MED ORDER — PRAVASTATIN SODIUM 40 MG PO TABS
40.0000 mg | ORAL_TABLET | Freq: Every day | ORAL | Status: DC
  Administered 2015-10-08 – 2015-10-09 (×2): 40 mg via ORAL
  Filled 2015-10-08 (×2): qty 1

## 2015-10-08 MED ORDER — BENAZEPRIL HCL 20 MG PO TABS
40.0000 mg | ORAL_TABLET | Freq: Every day | ORAL | Status: DC
  Administered 2015-10-08 – 2015-10-10 (×3): 40 mg via ORAL
  Filled 2015-10-08: qty 2
  Filled 2015-10-08: qty 1
  Filled 2015-10-08: qty 2

## 2015-10-08 MED ORDER — INSULIN GLARGINE 100 UNIT/ML ~~LOC~~ SOLN
15.0000 [IU] | Freq: Every day | SUBCUTANEOUS | Status: DC
  Administered 2015-10-08 – 2015-10-09 (×2): 15 [IU] via SUBCUTANEOUS
  Filled 2015-10-08 (×3): qty 0.15

## 2015-10-08 NOTE — ED Triage Notes (Signed)
Pt sts right sided numbness starting yesterday with generalized weakness

## 2015-10-08 NOTE — ED Notes (Signed)
Patient returned from MRI.

## 2015-10-08 NOTE — H&P (Signed)
History and Physical    JAICE LAGUE NKN:[id number] DOB: [birth year] DOA: 10/08/2015   PCP: Ann Held, DO  Outpatient Specialists:  Neurology: Dr. Rosalin Hawking  Patient coming from: Home  Chief Complaint: Right-sided numbness and weakness  HPI: Stephanie Lloyd is a 45 y.o. female , married, lives with her spouse, independent of activities of daily living, right-handed, PMH of previous stroke with residual dysarthria which gets worse when she's tired and left-sided weakness, long-standing type II DM/IDDM and hypertension, HLD, immune deficiency disorder, thalamic hemorrhage, medication noncompliance (has not taken her insulin's in a month and antihypertensives since last week) presented to [facility] ED on 10/08/15 with complaints of right-sided numbness and weakness which started on 10/17/15 at approximately 1:30 PM. She initially noticed numbness of her right arm associated with weakness that gradually progressed to involve right side of her face and right lower extremity but then symptoms started to improve and hence states that she did not seek immediate medical attention. She also noted worsening slurred speech but no swallowing difficulties, headache or visual disturbances. She went to bed last night but when she woke up this morning she noted worsening right-sided weakness and numbness and hence decided to come to the ED. She gives >1 year history of blurred vision. She does not check blood pressures or CBGs at home. She has not been on antiplatelet therapy secondary to hypothalamic hemorrhage. Although aspirin was considered by her primary neurologist, this has never been started.  ED Course: Presented with initial blood pressure of 256/123 mmHg, blood glucose of 246 mg per DL otherwise unremarkable CBC and BMP, CT head without acute abnormalities but MRI brain showed acute 7 mm nonhemorrhagic infarct within the posterior inferior left midbrain and pons on the left, multiple remote lacunar  infarcts are present throughout the lower midbrain and upper pons bilaterally, multiple remote lacunar infarcts are present throughout the basal ganglia, advanced diffuse white matter disease. Patient passed bedside swallow screen. She was given hydralazine 10 mg IV 1 by EDP with current blood pressures of 183/101. Neurology consulted and recommended aspirin 81 MG daily. Hospitalist admission was requested.  Review of Systems:  All other systems reviewed and apart from HPI, are negative.  Past Medical History:  Diagnosis Date  . Diabetes ([place])   . Diabetes mellitus without complication ([place])   . Hypertension   . Immune deficiency disorder ([place])   . Stroke [facility])     History reviewed. No pertinent surgical history.  Social history  reports that she has never smoked. She has never used smokeless tobacco. She reports that she does not drink alcohol or use drugs.  Allergies  Allergen Reactions  . Metformin And Related Diarrhea    History reviewed. No pertinent family history. Patient was interviewed regarding family history and she does not know of any significant family history.   Prior to Admission medications   Medication Sig Start Date End Date Taking? Authorizing Provider  benazepril (LOTENSIN) 40 MG tablet Take 1 tablet (40 mg total) by mouth daily. 08/18/15  Yes [name], DO  carvedilol (COREG) 25 MG tablet Take 1 tablet (25 mg total) by mouth 2 (two) times daily with a meal. 09/05/15  Yes [name], DO  lovastatin (MEVACOR) 20 MG tablet Take 2 tablets (40 mg total) by mouth at bedtime. 08/19/15  Yes [name], DO  ACCU-CHEK FASTCLIX LANCETS MISC Check blood sugar twice daily. Dx E:11.9 09/08/15   Rosalita Chessman  Chase, DO  Blood Glucose Monitoring Suppl (ACCU-CHEK GUIDE) w/Device KIT 1 Device by Does not apply route as directed. Check blood sugar daily 09/08/15   Rosalita Chessman Chase, DO  glucose blood (ACCU-CHEK GUIDE) test strip Check blood sugar  twice daily. Dx E:11.9 09/08/15   Rosalita Chessman Chase, DO  insulin NPH-regular Human (NOVOLIN 70/30 RELION) (70-30) 100 UNIT/ML injection Inject 15 Units into the skin 2 (two) times daily with a meal. 20 units SubQ bid 30-45 min before meals Patient not taking: Reported on 09/05/2015 08/19/15   Ann Held, DO    Physical Exam: Vitals:   10/08/15 1745 10/08/15 1800 10/08/15 1806 10/08/15 1815  BP: (!) 189/108 (!) 193/111  (!) 183/101  Pulse: 63 68  68  Resp: _0 Temp:   97.8 F (36.6 C)   TempSrc:      SpO2: 100% 100%  100%      Constitutional: Pleasant young female, moderately built and nourished, lying comfortably propped up in the gurney in the ED. Eyes: PERTLA, lids and conjunctivae normal ENMT: Mucous membranes are moist. Posterior pharynx clear of any exudate or lesions. Normal dentition.  Neck: normal, supple, no masses, no thyromegaly Respiratory: clear to auscultation bilaterally, no wheezing, no crackles. Normal respiratory effort. No accessory muscle use.  Cardiovascular: S1 & S2 heard, regular rate and rhythm, no murmurs / rubs / gallops. No extremity edema. 2+ pedal pulses. No carotid bruits.  Abdomen: No distension, no tenderness, no masses palpated. No hepatosplenomegaly. Bowel sounds normal.  Musculoskeletal: no clubbing / cyanosis. No joint deformity upper and lower extremities. Good ROM, no contractures. Normal muscle tone.  Skin: no rashes, lesions, ulcers. No induration Neurologic: Alert and oriented 4. Left facial droop. Dysarthria +. Left limbs grade 4+ by 5 power. Right limbs grade 5 x 5 power except grip is 4 x 5. Diminished touch sensation over right half of body. DTR normal.   Psychiatric: Normal judgment and insight. Alert and oriented x 3. Normal mood.     Labs on Admission: I have personally reviewed following labs and imaging studies  CBC:  Recent Labs Lab 10/08/15 1240  WBC 7.0  HGB 12.4  HCT 38.8  MCV 82.9  PLT 498   Basic  Metabolic Panel:  Recent Labs Lab 10/08/15 1240  NA 137  K 4.1  CL 104  CO2 25  GLUCOSE 246*  BUN 15  CREATININE 1.15*  CALCIUM 9.4     Radiological Exams on Admission: Ct Head Wo Contrast  Result Date: 10/08/2015 CLINICAL DATA:  Right-sided numbness and generalize weakness EXAM: CT HEAD WITHOUT CONTRAST TECHNIQUE: Contiguous axial images were obtained from the base of the skull through the vertex without intravenous contrast. COMPARISON:  None. FINDINGS: Brain: No evidence of acute infarction, hemorrhage, hydrocephalus, extra-axial collection or mass lesion/mass effect.There is patchy low attenuation within the subcortical and periventricular white matter compatible with chronic microvascular disease. There is several chronic lacunar infarcts identified within the right basal ganglia. Vascular: No hyperdense vessel or unexpected calcification. Skull: Normal. Negative for fracture or focal lesion. Sinuses/Orbits: No acute finding. Other: None. IMPRESSION: 1. No acute intracranial abnormalities. 2. Chronic microvascular disease and brain atrophy. Electronically Signed   By: Kerby Moors M.D.   On: 10/08/2015 14:51   Mr Brain Wo Contrast  Result Date: 10/08/2015 CLINICAL DATA:  Right-sided numbness and generalized weakness. EXAM: MRI HEAD WITHOUT CONTRAST TECHNIQUE: Multiplanar, multiecho pulse sequences of the brain and surrounding structures were obtained  without intravenous contrast. COMPARISON:  CT head without contrast 10/08/2015 FINDINGS: Brain: The diffusion-weighted images demonstrate acute nonhemorrhagic infarct posteriorly within the inferior midbrain on the left. Multiple remote lacunar infarcts are present throughout the pons and lower midbrain. T2 changes are associated with the acute infarct as well. There multiple foci of susceptibility throughout the brainstem. Extensive punctate susceptibility artifacts are present throughout both cerebral hemispheres as well. Multiple remote  lacunar infarcts are present in the basal ganglia bilaterally. Multiple foci of susceptibility are associated. No acute hemorrhage is present. The ventricles are proportionate to the degree of atrophy. No significant extra-axial fluid collection is present. Vascular: Flow is present in the major intracranial arteries. The right vertebral artery is hypoplastic. Skull and upper cervical spine: The skullbase is within normal limits. Midline sagittal images are otherwise unremarkable. The upper cervical spine is within normal limits. Sinuses/Orbits: Mild mucosal thickening is present in the ethmoid air cells bilaterally. The remaining paranasal sinuses and the mastoid air cells are clear. The globes and orbits are intact. Other: IMPRESSION: 1. Acute 7 mm nonhemorrhagic infarct within the posterior inferior left midbrain and pons on the left. 2. Multiple more remote lacunar infarcts are present throughout the lower midbrain and upper pons bilaterally. 3. Multiple remote lacunar infarcts are present throughout the basal ganglia. 4. Advanced diffuse white matter disease. 5. Multiple foci of susceptibility throughout the brainstem and bilateral cerebral hemispheres suggesting underlying vasculitis. Electronically Signed   By: San Morelle M.D.   On: 10/08/2015 16:59    EKG: Independently reviewed. sinus rhythm at 68 bpm,? LAD, LVH with repolarization abnormalities and no acute findings.  Assessment/Plan Active Problems:   Diabetes mellitus ([place])   Type 2 diabetes mellitus with other circulatory complications (HCC)   Hyperlipidemia   Hypertensive urgency   Focal infarction of brain ([place])     Acute left brain stroke: Acute 7 mm nonhemorrhagic infarct within the posterior inferior left midbrain and pons on the left - Resultant right hemiparesis and worsening dysarthria - CT brain and MRI results as above. - Neurology has consulted and recommended completing stroke workup including carotid Dopplers,  2-D echo, fasting lipids, hemoglobin A1c and starting aspirin 81 MG daily. Discussed with Dr. Leonel Ramsay. Defer further management to stroke M.D. in a.m. - PT, OT and ST evaluation. - Allow permissive hypertension and treat if BP >220/120 mmHg. - Continue home statins.  Hypertensive urgency - Presented with initial blood pressure of 256/123 - Allow for permissive hypertension given recent acute stroke. Treat for BP >220/120 - Continue home dose of carvedilol and benazepril. - Likely has chronic severely uncontrolled hypertension. Complicated by noncompliance. Not sure if workup for secondary causes has been done as outpatient.  Uncontrolled type II DM/IDDM - Patient has not taken insulin's for 1 month for unclear reasons. - Start Lantus 15 units subcutaneously at bedtime and NovoLog sliding scale insulin. - Check hemoglobin A1c. Hemoglobin A1c was 8.8 in July 2017.  Hyperlipidemia - Check LDL. Continue home dose of statins.  Medication noncompliance - Counseled.   DVT prophylaxis: Lovenox  Code Status: Full  Family Communication: None at bedside.  Disposition Plan: to be determined pending therapy evaluation.  Consults called: Neurology  Admission status: Inpatient, Telemetry.   [facility] MD [facility] Pager [phone]  If 7PM-7AM, please contact night-coverage [email or web address] Password [facility]  10/08/2015, 6:44 PM

## 2015-10-08 NOTE — ED Notes (Signed)
Nurse will draw labs. 

## 2015-10-08 NOTE — ED Provider Notes (Signed)
[facility] DEPT Provider Note   CSN: [id number] Arrival date & time: 10/08/15  1152     History   Chief Complaint Chief Complaint  Patient presents with  . Numbness    HPI Stephanie Lloyd is a 45 y.o. female.  HPI  45 y.o. female with a hx of DM, HTN, Stroke, presents to the Emergency Department today complaining of right sided numbness since yesterday morning. Pt states that the right side of her body from the top of her head to the bottom of her feet feels numb. Does not endorse any pain. No headaches. No dizziness. No blurred vision. No CP/SOB/ABD pain. No fevers. States that she has been seeing her PCP recently to try and get her blood pressure under control. Has had elevated blood pressure x 1 year that they have been trying to treat with unknown origin. Notes the numbness is new for her and decided to come in to be evaluated. No other symptoms noted. Pt does not take blood thinners.   Past Medical History:  Diagnosis Date  . Diabetes ([place])   . Diabetes mellitus without complication ([place])   . Hypertension   . Immune deficiency disorder ([place])   . Stroke [facility])     Patient Active Problem List   Diagnosis Date Noted  . Syncope 08/19/2015  . Hypertensive emergency 07/29/2015  . Hypertensive urgency 07/28/2015  . Hypokalemia 07/28/2015  . Accelerated hypertension 09/21/2014  . Type 2 diabetes mellitus with other circulatory complications ([place]) 36/14/4315  . Hyperlipidemia 09/21/2014  . Thalamic hemorrhage with stroke ([place]) 07/29/2014  . Hyperlipidemia LDL goal <70 07/29/2014  . Hemorrhagic stroke ([place]) 06/22/2014  . ICH (intracerebral hemorrhage) ([place]) 06/22/2014  . Hypertension 05/13/2012  . Diabetes mellitus ([place]) 05/13/2012    History reviewed. No pertinent surgical history.  OB History    Gravida Para Term Preterm AB Living   0 0 0 0 0     SAB TAB Ectopic Multiple Live Births   0 0 0           Home Medications    Prior to Admission medications     Medication Sig Start Date End Date Taking? Authorizing Provider  ACCU-CHEK FASTCLIX LANCETS MISC Check blood sugar twice daily. Dx E:11.9 09/08/15   Rosalita Chessman Chase, DO  benazepril (LOTENSIN) 40 MG tablet Take 1 tablet (40 mg total) by mouth daily. 08/18/15   Rosalita Chessman Chase, DO  Blood Glucose Monitoring Suppl (ACCU-CHEK GUIDE) w/Device KIT 1 Device by Does not apply route as directed. Check blood sugar daily 09/08/15   Rosalita Chessman Chase, DO  carvedilol (COREG) 25 MG tablet Take 1 tablet (25 mg total) by mouth 2 (two) times daily with a meal. 09/05/15   Rosalita Chessman Chase, DO  glucose blood (ACCU-CHEK GUIDE) test strip Check blood sugar twice daily. Dx E:11.9 09/08/15   Rosalita Chessman Chase, DO  insulin NPH-regular Human (NOVOLIN 70/30 RELION) (70-30) 100 UNIT/ML injection Inject 15 Units into the skin 2 (two) times daily with a meal. 20 units SubQ bid 30-45 min before meals Patient not taking: Reported on 09/05/2015 08/19/15   Alferd Apa [name], DO  lovastatin (MEVACOR) 20 MG tablet Take 2 tablets (40 mg total) by mouth at bedtime. 08/19/15   Ann Held, DO    Family History History reviewed. No pertinent family history.  Social History Social History  Substance Use Topics  . Smoking status: Never Smoker  . Smokeless tobacco: Never Used  .  Alcohol use No     Comment: occ     Allergies   Metformin and related   Review of Systems Review of Systems ROS reviewed and all are negative for acute change except as noted in the HPI.  Physical Exam Updated Vital Signs BP (!) 223/123 (BP Location: Left Arm)   Pulse 64   Temp 97.9 F (36.6 C) (Oral)   Resp 13   SpO2 100%   Physical Exam  Constitutional: She is oriented to person, place, and time. Vital signs are normal. She appears well-developed and well-nourished.  HENT:  Head: Normocephalic.  Right Ear: Hearing normal.  Left Ear: Hearing normal.  Eyes: Conjunctivae and EOM are normal. Pupils are equal, round,  and reactive to light.  Neck: Normal range of motion. Neck supple.  Cardiovascular: Normal rate, regular rhythm, normal heart sounds and intact distal pulses.   BP Right Arm: 228/112 BP Left Arm: 223/123  Pulmonary/Chest: Effort normal and breath sounds normal. No respiratory distress. She has no wheezes. She has no rales. She exhibits no tenderness.  Neurological: She is alert and oriented to person, place, and time. She has normal strength. No cranial nerve deficit or sensory deficit.  Neg Pronator Drift Motor/sensation intact x 4 extremities. Distal pulses appreciated x4 Cranial Nerves:  II: Pupils equal, round, reactive to light III,IV, VI: ptosis not present, extra-ocular motions intact bilaterally  V,VII: smile symmetric, facial light touch sensation equal VIII: hearing grossly normal bilaterally  IX,X: midline uvula rise  XI: bilateral shoulder shrug equal and strong XII: midline tongue extension  Skin: Skin is warm and dry.  Psychiatric: She has a normal mood and affect. Her speech is normal and behavior is normal. Thought content normal.  Nursing note and vitals reviewed.  ED Treatments / Results  Labs (all labs ordered are listed, but only abnormal results are displayed) Labs Reviewed  BASIC METABOLIC PANEL - Abnormal; Notable for the following:       Result Value   Glucose, Bld 246 (*)    Creatinine, Ser 1.15 (*)    GFR calc non Af Amer 56 (*)    All other components within normal limits  CBC    EKG  EKG Interpretation  Date/Time:  Sunday October 08 2015 12:34:35 EDT Ventricular Rate:  68 PR Interval:    QRS Duration: 93 QT Interval:  440 QTC Calculation: 468 R Axis:   7 Text Interpretation:  Sinus rhythm Probable left atrial enlargement LVH with secondary repolarization abnormality Anterior ST elevation, probably due to LVH Confirmed by KOHUT  MD, [place] [id number]) on 10/08/2015 2:43:25 PM      Radiology Ct Head Wo Contrast  Result Date:  10/08/2015 CLINICAL DATA:  Right-sided numbness and generalize weakness EXAM: CT HEAD WITHOUT CONTRAST TECHNIQUE: Contiguous axial images were obtained from the base of the skull through the vertex without intravenous contrast. COMPARISON:  None. FINDINGS: Brain: No evidence of acute infarction, hemorrhage, hydrocephalus, extra-axial collection or mass lesion/mass effect.There is patchy low attenuation within the subcortical and periventricular white matter compatible with chronic microvascular disease. There is several chronic lacunar infarcts identified within the right basal ganglia. Vascular: No hyperdense vessel or unexpected calcification. Skull: Normal. Negative for fracture or focal lesion. Sinuses/Orbits: No acute finding. Other: None. IMPRESSION: 1. No acute intracranial abnormalities. 2. Chronic microvascular disease and brain atrophy. Electronically Signed   By: Kerby Moors M.D.   On: 10/08/2015 14:51   Mr Brain Wo Contrast  Result Date: 10/08/2015 CLINICAL DATA:  Right-sided  numbness and generalized weakness. EXAM: MRI HEAD WITHOUT CONTRAST TECHNIQUE: Multiplanar, multiecho pulse sequences of the brain and surrounding structures were obtained without intravenous contrast. COMPARISON:  CT head without contrast 10/08/2015 FINDINGS: Brain: The diffusion-weighted images demonstrate acute nonhemorrhagic infarct posteriorly within the inferior midbrain on the left. Multiple remote lacunar infarcts are present throughout the pons and lower midbrain. T2 changes are associated with the acute infarct as well. There multiple foci of susceptibility throughout the brainstem. Extensive punctate susceptibility artifacts are present throughout both cerebral hemispheres as well. Multiple remote lacunar infarcts are present in the basal ganglia bilaterally. Multiple foci of susceptibility are associated. No acute hemorrhage is present. The ventricles are proportionate to the degree of atrophy. No significant  extra-axial fluid collection is present. Vascular: Flow is present in the major intracranial arteries. The right vertebral artery is hypoplastic. Skull and upper cervical spine: The skullbase is within normal limits. Midline sagittal images are otherwise unremarkable. The upper cervical spine is within normal limits. Sinuses/Orbits: Mild mucosal thickening is present in the ethmoid air cells bilaterally. The remaining paranasal sinuses and the mastoid air cells are clear. The globes and orbits are intact. Other: IMPRESSION: 1. Acute 7 mm nonhemorrhagic infarct within the posterior inferior left midbrain and pons on the left. 2. Multiple more remote lacunar infarcts are present throughout the lower midbrain and upper pons bilaterally. 3. Multiple remote lacunar infarcts are present throughout the basal ganglia. 4. Advanced diffuse white matter disease. 5. Multiple foci of susceptibility throughout the brainstem and bilateral cerebral hemispheres suggesting underlying vasculitis. Electronically Signed   By: San Morelle M.D.   On: 10/08/2015 16:59    Procedures Procedures (including critical care time)  Medications Ordered in ED Medications - No data to display   Initial Impression / Assessment and Plan / ED Course  I have reviewed the triage vital signs and the nursing notes.  Pertinent labs & imaging results that were available during my care of the patient were reviewed by me and considered in my medical decision making (see chart for details).  Clinical Course   Final Clinical Impressions(s) / ED Diagnoses  I have reviewed and evaluated the relevant laboratory values I have reviewed and evaluated the relevant imaging studies.  I have interpreted the relevant EKG. I have reviewed the relevant previous healthcare records. I have reviewed EMS Documentation.I obtained HPI from historian. Patient discussed with supervising physician  ED Course:  Assessment: Pt is a 63yF with hx HTN, DM  who presents with right sided numbness since yesterday morning. No hx same. Notes uncontrolled HTN x 1 year with BP today 256/123. Did not take BP meds this morning. On exam, pt in NAD. Nontoxic/nonseptic appearing. VSS. Afebrile. Lungs CTA. Heart RRR. Neurologic exam unremarkable no appreciable sensory or motor deficits. CN evaluated and unremarkable. No blurred vision. No headaches. Given home BP meds as well as 60m Hydralazine in ED. CBC unremarkable. BMP unremarkable. EKG unremarkable. CT Head unremarkable. Discussed with supervising physician. Consulted Neurology (Dr. KLeonel Ramsay and obtained MRI with Contrast, which showed an acute 762mnon hemorrhagic infarct within the posterior inferior midbrain and pons on the left. Will admit to medicine for stroke workup with Neurology to follow. Pt would benefit from workup for secondary causes of hypertension.   Disposition/Plan:  Admit Pt acknowledges and agrees with plan  Supervising Physician StVirgel ManifoldMD   Final [facility]:  [facility] Secondary hypertension, unspecified    New Prescriptions New Prescriptions   No medications  on file     Shary Decamp, PA-C 10/08/15 [place], MD 10/09/15 1245

## 2015-10-08 NOTE — ED Notes (Signed)
Pt here with new onset numbness since yesterday. Pt CT negative, MRI shows new infarct. Pt a/o x 4, independent at home. NIH 2 (sensory to right side and speech). Husband sts speech worsened. Next neuro check due at 2005. Pt passed swallow screen.

## 2015-10-08 NOTE — Consult Note (Signed)
Referring Physician: Dr Wilson Singer    Chief Complaint: Right-sided numbness  HPI: Stephanie Lloyd is an 45 y.o. female with a history of diabetes mellitus, hypertension, immune deficiency disorder, previous stroke, hyperlipidemia, and history of a thalamic hemorrhage, who presents to emergency department for evaluation of right-sided numbness. The patient has very mild residual left hemiparesis and mild dysarthria from her previous stroke. She is followed at [facility] by Dr. Erlinda Hong. She had not been on antiplatelet therapy secondary to her thalamic hemorrhage. At some point Dr. Erlinda Hong considered low dose aspirin; however, the patient reports this medication was never started. She has a primary care physician but has experienced ongoing problems with hypertension and glucose control. An MRI today revealed an acute 7 mm nonhemorrhagic infarct within the posterior inferior left midbrain and pons on the left with multiple remote strokes. BP 188/109    Date last known well: Date: 10/07/2015 Time last known well: Time: 13:00 tPA Given: No - the patient is outside of the therapeutic window for treatment  Past Medical History:  Diagnosis Date  . Diabetes ([place])   . Diabetes mellitus without complication ([place])   . Hypertension   . Immune deficiency disorder ([place])   . Stroke [facility])     History reviewed. No pertinent surgical history.  History reviewed. No pertinent family history. Social History:  reports that she has never smoked. She has never used smokeless tobacco. She reports that she does not drink alcohol or use drugs.  Allergies:  Allergies  Allergen Reactions  . Metformin And Related Diarrhea    Medications: Current Facility-Administered Medications  Medication Dose Route Frequency Provider Last Rate Last Dose  . benazepril (LOTENSIN) tablet 40 mg  40 mg Oral Daily Shary Decamp, PA-C   40 mg at 10/08/15 1301  . carvedilol (COREG) tablet 25 mg  25 mg Oral BID WC Shary Decamp,  PA-C   25 mg at 10/08/15 1301  . hydrALAZINE (APRESOLINE) injection 10 mg  10 mg Intravenous Once Shary Decamp, PA-C   Stopped at 10/08/15 1438   Current Outpatient Prescriptions  Medication Sig Dispense Refill  . ACCU-CHEK FASTCLIX LANCETS MISC Check blood sugar twice daily. Dx E:11.9 102 each 12  . benazepril (LOTENSIN) 40 MG tablet Take 1 tablet (40 mg total) by mouth daily. 30 tablet 0  . Blood Glucose Monitoring Suppl (ACCU-CHEK GUIDE) w/Device KIT 1 Device by Does not apply route as directed. Check blood sugar daily 1 kit 0  . carvedilol (COREG) 25 MG tablet Take 1 tablet (25 mg total) by mouth 2 (two) times daily with a meal. 60 tablet 2  . glucose blood (ACCU-CHEK GUIDE) test strip Check blood sugar twice daily. Dx E:11.9 100 each 12  . insulin NPH-regular Human (NOVOLIN 70/30 RELION) (70-30) 100 UNIT/ML injection Inject 15 Units into the skin 2 (two) times daily with a meal. 20 units SubQ bid 30-45 min before meals (Patient not taking: Reported on 09/05/2015) 10 mL 1  . lovastatin (MEVACOR) 20 MG tablet Take 2 tablets (40 mg total) by mouth at bedtime. 60 tablet 1     ROS: History obtained from the patient and husband  General ROS: negative for - chills, fatigue, fever, night sweats, weight gain or weight loss. Recent fatigue Psychological ROS: negative for - behavioral disorder, hallucinations, memory difficulties, mood swings or suicidal ideation Ophthalmic ROS: negative for - blurry vision, double vision, eye pain or loss of vision ENT ROS: negative for - epistaxis, nasal discharge, oral lesions, sore throat,  tinnitus or vertigo Allergy and Immunology ROS: negative for - hives or itchy/watery eyes Hematological and Lymphatic ROS: negative for - bleeding problems, bruising or swollen lymph nodes Endocrine ROS: negative for - galactorrhea, hair pattern changes, polydipsia/polyuria or temperature intolerance Respiratory ROS: negative for - cough, hemoptysis, shortness of breath or  wheezing Cardiovascular ROS: negative for - chest pain, dyspnea on exertion, edema or irregular heartbeat Gastrointestinal ROS: negative for - abdominal pain, diarrhea, hematemesis, nausea/vomiting or stool incontinence Genito-Urinary ROS: negative for - dysuria, hematuria, incontinence or urinary frequency/urgency Musculoskeletal ROS: negative for - joint swelling or muscular weakness Neurological ROS: as noted in HPI - Mild memory deficits since stroke Dermatological ROS: negative for rash and skin lesion changes   Physical Examination: Blood pressure 156/94, pulse 63, temperature 98.1 F (36.7 C), temperature source Oral, resp. rate 10, SpO2 99 %.  General - pleasant, soft-spoken, 45 year old female with mild dysarthria in no acute distress. Heart - Regular rate and rhythm - no murmer Lungs - Clear to auscultation Abdomen - Soft - non tender Extremities - Distal pulses intact - trace edema Skin - Warm and dry  Mental Status: Alert, oriented, thought content appropriate.  Speech fluent without evidence of aphasia.  Able to follow 3 step commands without difficulty. Cranial Nerves: II: Discs not visualized; Visual fields grossly normal, pupils equal, round, reactive to light and accommodation III,IV, VI: ptosis not present, extra-ocular motions intact bilaterally V,VII: Slight left lower facial droop, facial light touch sensation mildly decreased on the right. VIII: hearing normal bilaterally IX,X: gag reflex present XI: bilateral shoulder shrug XII: midline tongue extension Motor: Right : Upper extremity   5/5    Left:     Upper extremity   4+/5  Lower extremity   5/5     Lower extremity   4+/5 Tone and bulk:normal tone throughout; no atrophy noted Sensory: Decreased sensation to light touch in the right upper and lower extremities Deep Tendon Reflexes: 2+ and symmetric throughout Plantars: Right: downgoing   Left: downgoing Cerebellar: normal finger-to-nose, normal  heel-to-shin, mild difficulty with rapid alternating movements on the right. Gait: Not tested    Laboratory Studies:  Basic Metabolic Panel:  Recent Labs Lab 10/08/15 1240  NA 137  K 4.1  CL 104  CO2 25  GLUCOSE 246*  BUN 15  CREATININE 1.15*  CALCIUM 9.4    Liver Function Tests: No results for input(s): AST, ALT, ALKPHOS, BILITOT, PROT, ALBUMIN in the last 168 hours. No results for input(s): LIPASE, AMYLASE in the last 168 hours. No results for input(s): AMMONIA in the last 168 hours.  CBC:  Recent Labs Lab 10/08/15 1240  WBC 7.0  HGB 12.4  HCT 38.8  MCV 82.9  PLT 190    Cardiac Enzymes: No results for input(s): CKTOTAL, CKMB, CKMBINDEX, TROPONINI in the last 168 hours.  BNP: Invalid input(s): POCBNP  CBG: No results for input(s): GLUCAP in the last 168 hours.  Microbiology: Results for orders placed or performed during the hospital encounter of 07/28/15  MRSA PCR Screening     Status: None   Collection Time: 07/29/15  1:28 AM  Result Value Ref Range Status   MRSA by PCR NEGATIVE NEGATIVE Final    Comment:        The GeneXpert MRSA Assay (FDA approved for NASAL specimens only), is one component of a comprehensive MRSA colonization surveillance program. It is not intended to diagnose MRSA infection nor to guide or monitor treatment for MRSA infections.  Coagulation Studies: No results for input(s): LABPROT, INR in the last 72 hours.  Urinalysis: No results for input(s): COLORURINE, LABSPEC, PHURINE, GLUCOSEU, HGBUR, BILIRUBINUR, KETONESUR, PROTEINUR, UROBILINOGEN, NITRITE, LEUKOCYTESUR in the last 168 hours.  Invalid input(s): APPERANCEUR  Lipid Panel:    Component Value Date/Time   CHOL 255 (H) 05/23/2015 1016   TRIG 110.0 05/23/2015 1016   HDL 68.80 05/23/2015 1016   CHOLHDL 4 05/23/2015 1016   VLDL 22.0 05/23/2015 1016   LDLCALC 164 (H) 05/23/2015 1016    HgbA1C:  Lab Results  Component Value Date   HGBA1C 8.8 (H) 07/31/2015     Urine Drug Screen:     Component Value Date/Time   LABOPIA NONE DETECTED 07/31/2015 1226   COCAINSCRNUR NONE DETECTED 07/31/2015 1226   LABBENZ NONE DETECTED 07/31/2015 1226   AMPHETMU NONE DETECTED 07/31/2015 1226   THCU NONE DETECTED 07/31/2015 1226   LABBARB NONE DETECTED 07/31/2015 1226    Alcohol Level: No results for input(s): ETH in the last 168 hours.  Other results: EKG: Sinus rhythm rate 68 bpm. Please see cardiology interpretation for full details.  Imaging:   Ct Head Wo Contrast 10/08/2015 1. No acute intracranial abnormalities.  2. Chronic microvascular disease and brain atrophy.    MRI Brain without contrast 10/08/2015 1. Acute 7 mm nonhemorrhagic infarct within the posterior inferior left midbrain and pons on the left. 2. Multiple more remote lacunar infarcts are present throughout the lower midbrain and upper pons bilaterally. 3. Multiple remote lacunar infarcts are present throughout the basal ganglia. 4. Advanced diffuse white matter disease. 5. Multiple foci of susceptibility throughout the brainstem and bilateral cerebral hemispheres suggesting underlying vasculitis.    Assessment: 45 y.o. female  followed by Dr. Erlinda Hong at [facility] (last seen in August 2016) with history of diabetes mellitus, hypertension, and immunodeficiency disorder, previous stroke, hyperlipidemia, and history of a thalamic hemorrhage presents to the emergency department today for further evaluation of right sided numbness which started yesterday as she was leaving church. The numbness has persisted and is somewhat worse today than yesterday. She denies weakness. A CT scan of the head showed no acute intracranial abnormalities. MRI Brain without contrast - acute 7 mm nonhemorrhagic infarct within the posterior inferior left midbrain and pons on the left.  Stroke Risk Factors - diabetes mellitus, hyperlipidemia, hypertension and previous stroke  Plan: 1. HgbA1c,  fasting lipid panel 2. MRI, MRA  of the brain without contrast 3. PT consult, OT consult, Speech consult 4. Echocardiogram 5. Carotid dopplers 6. Prophylactic therapy- discussed with Dr. Leonel Ramsay. Start aspirin 81 mg daily. 7. NPO until RN stroke swallow screen 8. Telemetry monitoring 9. Frequent neuro checks  Lowry Ram [facility] Pager [phone] 10/08/2015, 5:13 PM     I have seen the patient and agree with the above note. I suspect that her issues are mostly related to long-standing severe hypertension. Though she has had a hypertensive hemorrhage in the past, she also has had multiple ischemic strokes and I do wonder if an epidural therapy may be beneficial. Blood pressure control is absolutely crucial for her and she is going to need aggressive control over the long-term, though permissive hypertension in the acute phase as indicated.  Stroke team to follow.  Roland Rack, MD [facility] [phone]  If 7pm- 7am, please page neurology on call as listed in [place].

## 2015-10-09 ENCOUNTER — Inpatient Hospital Stay (HOSPITAL_COMMUNITY): Payer: Medicaid Other

## 2015-10-09 DIAGNOSIS — I639 Cerebral infarction, unspecified: Secondary | ICD-10-CM

## 2015-10-09 DIAGNOSIS — I6789 Other cerebrovascular disease: Secondary | ICD-10-CM

## 2015-10-09 DIAGNOSIS — I6302 Cerebral infarction due to thrombosis of basilar artery: Secondary | ICD-10-CM

## 2015-10-09 LAB — GLUCOSE, CAPILLARY
GLUCOSE-CAPILLARY: 144 mg/dL — AB (ref 65–99)
Glucose-Capillary: 168 mg/dL — ABNORMAL HIGH (ref 65–99)
Glucose-Capillary: 160 mg/dL — ABNORMAL HIGH (ref 65–99)
Glucose-Capillary: 98 mg/dL (ref 65–99)

## 2015-10-09 LAB — LIPID PANEL
CHOLESTEROL: 174 mg/dL (ref 0–200)
HDL: 50 mg/dL (ref >40)
LDL CALC: 102 mg/dL — AB (ref 0–99)
TRIGLYCERIDES: 108 mg/dL (ref <150)
Total CHOL/HDL Ratio: 3.5 RATIO
VLDL: 22 mg/dL (ref 0–40)

## 2015-10-09 LAB — ECHOCARDIOGRAM COMPLETE

## 2015-10-09 NOTE — Progress Notes (Signed)
PROGRESS NOTE  Stephanie Lloyd  ZOX:[id number] DOB: 1971-01-06  DOA: 10/08/2015 PCP: Donato Schultz, DO   Brief Narrative:  45 y.o. female , married, lives with her spouse, independent of activities of daily living, right-handed, PMH of previous stroke with residual dysarthria which gets worse when she's tired and left-sided weakness, long-standing type II DM/IDDM and hypertension, HLD, immune deficiency disorder, thalamic hemorrhage, medication noncompliance (has not taken her insulin's in a month and antihypertensives since last week) presented to [facility] ED on 10/08/15 with complaints of right-sided numbness and weakness which started on 10/17/15 at approximately 1:30 PM. MRI brain confirmed acute 7 mm nonhemorrhagic infarct within the posterior inferior left midbrain and pons on the left. Neurology was consulted. Admitted for stroke evaluation and management.   Assessment & Plan:   Active Problems:   Diabetes mellitus (HCC)   Type 2 diabetes mellitus with other circulatory complications (HCC)   Hyperlipidemia   Hypertensive urgency   Focal infarction of brain (HCC)   Acute left brain stroke: Acute 7 mm nonhemorrhagic infarct within the posterior inferior left midbrain and pons on the left - Resultant right hemiparesis and worsening dysarthria - CT brain 10/08/15: No acute intracranial abnormalities. - MRI brain 10/08/15: Acute 7 mm nonhemorrhagic infarct within the posterior inferior left midbrain and pons on the left. Multiple old infarcts bilaterally. Advanced diffuse white matter disease.  - CTA head: Intracranial atherosclerosis with notable high-grade left P2 segment stenosis  - CTA neck: Mild atherosclerosis of the neck without stenosis. -  2-D echo 06/23/14: LVEF 60-65 percent and no cardiac source of emboli was identified. Repeat echo pending.  - LDL: 102. Consider increasing dose of statins. - Hemoglobin A1c: 8.8 on 07/31/15. Repeat A1c pending. - Patient not on antiplatelets prior  to admission. - Neurologist consulted on admission and recommended starting aspirin 81 MG daily. Awake stroke team follow-up. - PT, OT and ST evaluation: No PT follow-up .  Hypertensive urgency - Presented with initial blood pressure of 256/123 - Allow for permissive hypertension given recent acute stroke. Treat for BP >220/120 - Continue home dose of carvedilol and benazepril. - Likely has chronic severely uncontrolled hypertension. Complicated by noncompliance. Not sure if workup for secondary causes has been done as outpatient.  Uncontrolled type II DM/IDDM - Patient has not taken insulin's for 1 month> lacks insurance and issues with affordability. Case management consulted. - Started Lantus 15 units subcutaneously at bedtime and NovoLog sliding scale insulin. CBGs better. At discharge, may consider [employer] brand Relion 70/30 insulin - Check hemoglobin A1c. Hemoglobin A1c was 8.8 in July 2017.  Hyperlipidemia - LDL: 102. Continue home dose of statins - consider increasing dose.  Medication noncompliance - Counseled.   DVT prophylaxis: Lovenox Code Status: Full Family Communication: None at bedside Disposition Plan: DC home pending completion of stroke workup and therapy's evaluation, possibly 10/10/15.    Consultants:   Neurology  Procedures:   None  Antimicrobials:   None    Subjective: Feels slightly better. Speech and strength on right side have improved somewhat. Continues to have right-sided numbness.  Objective:  Vitals:   10/09/15 0200 10/09/15 0400 10/09/15 0600 10/09/15 1021  BP: (!) 167/113 (!) 159/87 (!) 166/115 129/82  Pulse: 61 64 (!) 56 67  Resp: 17 16 18 18   Temp: 97.5 F (36.4 C)  97.5 F (36.4 C) 98.7 F (37.1 C)  TempSrc: Oral  Oral Oral  SpO2: 100% 99% 100% 100%    Intake/Output Summary (Last 24 hours)  at 10/09/15 1325 Last data filed at 10/09/15 0839  Gross per 24 hour  Intake              600 ml  Output                0 ml    Net              600 ml   There were no vitals filed for this visit.  Examination:  General exam: Pleasant young female, sitting up comfortably in the chair this morning. Respiratory system: Clear to auscultation. Respiratory effort normal. Cardiovascular system: S1 & S2 heard, RRR. No JVD, murmurs, rubs, gallops or clicks. No pedal edema. Telemetry: Sinus rhythm. Gastrointestinal system: Abdomen is nondistended, soft and nontender. No organomegaly or masses felt. Normal bowel sounds heard. Central nervous system: Alert and oriented 3. Mild dysarthria which is better than on admission. Mild left lower facial weakness.  Extremities: Grade 4+ by 5 power in left limbs, grade 5 x 5 power in right limbs. Diminished touch over right half of body.  Skin: No rashes, lesions or ulcers Psychiatry: Judgement and insight appear normal. Mood & affect appropriate.     Data Reviewed: I have personally reviewed following labs and imaging studies  CBC:  Recent Labs Lab 10/08/15 1240  WBC 7.0  HGB 12.4  HCT 38.8  MCV 82.9  PLT 190   Basic Metabolic Panel:  Recent Labs Lab 10/08/15 1240  NA 137  K 4.1  CL 104  CO2 25  GLUCOSE 246*  BUN 15  CREATININE 1.15*  CALCIUM 9.4   GFR: CrCl cannot be calculated (Unknown ideal weight.). Liver Function Tests: No results for input(s): AST, ALT, ALKPHOS, BILITOT, PROT, ALBUMIN in the last 168 hours. No results for input(s): LIPASE, AMYLASE in the last 168 hours. No results for input(s): AMMONIA in the last 168 hours. Coagulation Profile: No results for input(s): INR, PROTIME in the last 168 hours. Cardiac Enzymes: No results for input(s): CKTOTAL, CKMB, CKMBINDEX, TROPONINI in the last 168 hours. BNP (last 3 results) No results for input(s): PROBNP in the last 8760 hours. HbA1C: No results for input(s): HGBA1C in the last 72 hours. CBG:  Recent Labs Lab 10/08/15 2151 10/09/15 0614 10/09/15 1131  GLUCAP 150* 144* 168*   Lipid  Profile:  Recent Labs  10/09/15 0431  CHOL 174  HDL 50  LDLCALC 102*  TRIG 108  CHOLHDL 3.5   Thyroid Function Tests: No results for input(s): TSH, T4TOTAL, FREET4, T3FREE, THYROIDAB in the last 72 hours. Anemia Panel: No results for input(s): VITAMINB12, FOLATE, FERRITIN, TIBC, IRON, RETICCTPCT in the last 72 hours.  Sepsis Labs: No results for input(s): PROCALCITON, LATICACIDVEN in the last 168 hours.  No results found for this or any previous visit (from the past 240 hour(s)).       Radiology Studies: Ct Angio Head W Or Wo Contrast  Result Date: 10/08/2015 CLINICAL DATA:  Evaluate stroke. EXAM: CT ANGIOGRAPHY HEAD AND NECK TECHNIQUE: Multidetector CT imaging of the head and neck was performed using the standard protocol during bolus administration of intravenous contrast. Multiplanar CT image reconstructions and MIPs were obtained to evaluate the vascular anatomy. Carotid stenosis measurements (when applicable) are obtained utilizing NASCET criteria, using the distal internal carotid diameter as the denominator. CONTRAST:  50 cc Isovue 370 intravenous COMPARISON:  Brain MRI and head CT from earlier today. FINDINGS: CTA NECK Aortic arch: Minimal atheromatous changes.  Three vessel branching. Right carotid system: Noncalcified  atheromatous thickening greatest at the bifurcation. No stenosis or signs of dissection. Left carotid system: Mild atheromatous noncalcified wall thickening greatest at the bifurcation. No stenosis or signs of dissection. Vertebral arteries:No proximal subclavian artery stenosis. Left dominant vertebral arteries. Both vertebral arteries are smooth and widely patent to the dura. Skeleton: No acute or aggressive process. Other neck: No incidental mass or adenopathy. Upper chest: Small layering pleural effusions are partly seen. Small cluster of nodules in the right upper lobe, an inflammatory pattern CTA HEAD Anterior circulation: Symmetric carotid arteries and  branching. Mild atherosclerotic calcification on the siphons. Mild to moderate right A1 origin stenosis. Mild atheromatous narrowing of left ACA and bilateral MCA branches. No major vessel occlusion. Negative for aneurysm. Posterior circulation: Left dominant with right vertebral artery ending in PICA. Sizable posterior communicating arteries with small smooth basilar. Bilateral visible AICA with probable stenosis at the right origin, but limited by small vessel size. Focal high-grade left P2 segment stenosis. Negative for aneurysm. No major vessel occlusion. Venous sinuses: Patent Anatomic variants: None significant Delayed phase: No parenchymal enhancement. Severe chronic microvascular disease with multiple chronic infarcts in the brain stem and deep gray nuclei with confluent ischemic gliosis in the cerebral white matter. No evidence of progression or hemorrhage at the known acute left brainstem infarct. Low brain volume for age. Findings likely related to patient's history of hypertension and diabetes. No notable temporal involvement to implicate CADASIL. IMPRESSION: 1. No acute arterial finding. 2. Intracranial atherosclerosis with notable high-grade left P2 segment stenosis. 3. Mild atherosclerosis of the neck without stenosis. 4. Advanced chronic microvascular disease. Known acute brainstem infarct without evidence of progression or hemorrhage. 5. Small layering pleural effusions. Electronically Signed   By: Marnee Spring M.D.   On: 10/08/2015 22:00   Dg Chest 2 View  Result Date: 10/08/2015 CLINICAL DATA:  Numbness to entire right-sided body. EXAM: CHEST  2 VIEW COMPARISON:  None FINDINGS: The heart size and mediastinal contours are within normal limits. Both lungs are clear. The visualized skeletal structures are unremarkable. IMPRESSION: No active cardiopulmonary disease. Electronically Signed   By: Signa Kell M.D.   On: 10/08/2015 21:12   Ct Head Wo Contrast  Result Date: 10/08/2015 CLINICAL  DATA:  Right-sided numbness and generalize weakness EXAM: CT HEAD WITHOUT CONTRAST TECHNIQUE: Contiguous axial images were obtained from the base of the skull through the vertex without intravenous contrast. COMPARISON:  None. FINDINGS: Brain: No evidence of acute infarction, hemorrhage, hydrocephalus, extra-axial collection or mass lesion/mass effect.There is patchy low attenuation within the subcortical and periventricular white matter compatible with chronic microvascular disease. There is several chronic lacunar infarcts identified within the right basal ganglia. Vascular: No hyperdense vessel or unexpected calcification. Skull: Normal. Negative for fracture or focal lesion. Sinuses/Orbits: No acute finding. Other: None. IMPRESSION: 1. No acute intracranial abnormalities. 2. Chronic microvascular disease and brain atrophy. Electronically Signed   By: Signa Kell M.D.   On: 10/08/2015 14:51   Ct Angio Neck W Or Wo Contrast  Result Date: 10/08/2015 CLINICAL DATA:  Evaluate stroke. EXAM: CT ANGIOGRAPHY HEAD AND NECK TECHNIQUE: Multidetector CT imaging of the head and neck was performed using the standard protocol during bolus administration of intravenous contrast. Multiplanar CT image reconstructions and MIPs were obtained to evaluate the vascular anatomy. Carotid stenosis measurements (when applicable) are obtained utilizing NASCET criteria, using the distal internal carotid diameter as the denominator. CONTRAST:  50 cc Isovue 370 intravenous COMPARISON:  Brain MRI and head CT from earlier today. FINDINGS:  CTA NECK Aortic arch: Minimal atheromatous changes.  Three vessel branching. Right carotid system: Noncalcified atheromatous thickening greatest at the bifurcation. No stenosis or signs of dissection. Left carotid system: Mild atheromatous noncalcified wall thickening greatest at the bifurcation. No stenosis or signs of dissection. Vertebral arteries:No proximal subclavian artery stenosis. Left dominant  vertebral arteries. Both vertebral arteries are smooth and widely patent to the dura. Skeleton: No acute or aggressive process. Other neck: No incidental mass or adenopathy. Upper chest: Small layering pleural effusions are partly seen. Small cluster of nodules in the right upper lobe, an inflammatory pattern CTA HEAD Anterior circulation: Symmetric carotid arteries and branching. Mild atherosclerotic calcification on the siphons. Mild to moderate right A1 origin stenosis. Mild atheromatous narrowing of left ACA and bilateral MCA branches. No major vessel occlusion. Negative for aneurysm. Posterior circulation: Left dominant with right vertebral artery ending in PICA. Sizable posterior communicating arteries with small smooth basilar. Bilateral visible AICA with probable stenosis at the right origin, but limited by small vessel size. Focal high-grade left P2 segment stenosis. Negative for aneurysm. No major vessel occlusion. Venous sinuses: Patent Anatomic variants: None significant Delayed phase: No parenchymal enhancement. Severe chronic microvascular disease with multiple chronic infarcts in the brain stem and deep gray nuclei with confluent ischemic gliosis in the cerebral white matter. No evidence of progression or hemorrhage at the known acute left brainstem infarct. Low brain volume for age. Findings likely related to patient's history of hypertension and diabetes. No notable temporal involvement to implicate CADASIL. IMPRESSION: 1. No acute arterial finding. 2. Intracranial atherosclerosis with notable high-grade left P2 segment stenosis. 3. Mild atherosclerosis of the neck without stenosis. 4. Advanced chronic microvascular disease. Known acute brainstem infarct without evidence of progression or hemorrhage. 5. Small layering pleural effusions. Electronically Signed   By: Marnee Spring M.D.   On: 10/08/2015 22:00   Mr Brain Wo Contrast  Result Date: 10/08/2015 CLINICAL DATA:  Right-sided numbness and  generalized weakness. EXAM: MRI HEAD WITHOUT CONTRAST TECHNIQUE: Multiplanar, multiecho pulse sequences of the brain and surrounding structures were obtained without intravenous contrast. COMPARISON:  CT head without contrast 10/08/2015 FINDINGS: Brain: The diffusion-weighted images demonstrate acute nonhemorrhagic infarct posteriorly within the inferior midbrain on the left. Multiple remote lacunar infarcts are present throughout the pons and lower midbrain. T2 changes are associated with the acute infarct as well. There multiple foci of susceptibility throughout the brainstem. Extensive punctate susceptibility artifacts are present throughout both cerebral hemispheres as well. Multiple remote lacunar infarcts are present in the basal ganglia bilaterally. Multiple foci of susceptibility are associated. No acute hemorrhage is present. The ventricles are proportionate to the degree of atrophy. No significant extra-axial fluid collection is present. Vascular: Flow is present in the major intracranial arteries. The right vertebral artery is hypoplastic. Skull and upper cervical spine: The skullbase is within normal limits. Midline sagittal images are otherwise unremarkable. The upper cervical spine is within normal limits. Sinuses/Orbits: Mild mucosal thickening is present in the ethmoid air cells bilaterally. The remaining paranasal sinuses and the mastoid air cells are clear. The globes and orbits are intact. Other: IMPRESSION: 1. Acute 7 mm nonhemorrhagic infarct within the posterior inferior left midbrain and pons on the left. 2. Multiple more remote lacunar infarcts are present throughout the lower midbrain and upper pons bilaterally. 3. Multiple remote lacunar infarcts are present throughout the basal ganglia. 4. Advanced diffuse white matter disease. 5. Multiple foci of susceptibility throughout the brainstem and bilateral cerebral hemispheres suggesting underlying vasculitis. Electronically Signed  By:  Marin Roberts M.D.   On: 10/08/2015 16:59        Scheduled Meds: .  stroke: mapping our early stages of recovery book   Does not apply Once  . aspirin EC  81 mg Oral Daily  . benazepril  40 mg Oral Daily  . carvedilol  25 mg Oral BID WC  . enoxaparin (LOVENOX) injection  40 mg Subcutaneous Q24H  . insulin aspart  0-15 Units Subcutaneous TID WC  . insulin aspart  0-5 Units Subcutaneous QHS  . insulin glargine  15 Units Subcutaneous QHS  . pravastatin  40 mg Oral q1800   Continuous Infusions: . sodium chloride 10 mL/hr at 10/08/15 2206     LOS: 1 day    Time spent: 30 minutes.    [facility], MD [facility] Pager [phone]  If 7PM-7AM, please contact night-coverage [email or web address] Password [email or web address] 10/09/2015, 1:25 PM

## 2015-10-09 NOTE — Progress Notes (Signed)
  Echocardiogram 2D Echocardiogram has been performed.  Arvil ChacoFoster, Weylin Plagge 10/09/2015, 3:37 PM

## 2015-10-09 NOTE — Progress Notes (Signed)
STROKE TEAM PROGRESS NOTE   HISTORY OF PRESENT ILLNESS (per record) Manfred ShirtsMarva N Lloyd is an 45 y.o. female with a history of diabetes mellitus, hypertension, immune deficiency disorder, previous stroke, hyperlipidemia, and history of a thalamic hemorrhage, who presents to emergency department for evaluation of right-sided numbness. The patient has very mild residual left hemiparesis and mild dysarthria from her previous stroke. She is [facility] at [facility] by Dr. Roda ShuttersXu. She had not been on antiplatelet therapy secondary to her thalamic hemorrhage. At some point Dr. Roda ShuttersXu considered low dose aspirin; however, the patient reports this medication was never started. She has a primary care physician but has experienced ongoing problems with hypertension and glucose control. An MRI today revealed an acute 7 mm nonhemorrhagic infarct within the posterior inferior left midbrain and pons on the left with multiple remote strokes. BP 188/109    Date last known well: Date: 10/07/2015 Time last known well: Time: 13:00 tPA Given: No - the patient is outside of the therapeutic window for treatment   SUBJECTIVE (INTERVAL HISTORY) No family is at the bedside.  Overall she feels her condition is gradually improving. She still has right face and right UE numbness. Right leg numbness resolved.    OBJECTIVE Temp:  [97.3 F (36.3 C)-98.7 F (37.1 C)] 98.7 F (37.1 C) (09/11 1021) Pulse Rate:  [56-76] 67 (09/11 1021) Cardiac Rhythm: Sinus bradycardia (09/11 0700) Resp:  [10-24] 18 (09/11 1021) BP: (129-256)/(82-160) 129/82 (09/11 1021) SpO2:  [93 %-100 %] 100 % (09/11 1021)  CBC:   Recent Labs Lab 10/08/15 1240  WBC 7.0  HGB 12.4  HCT 38.8  MCV 82.9  PLT 190    Basic Metabolic Panel:   Recent Labs Lab 10/08/15 1240  NA 137  K 4.1  CL 104  CO2 25  GLUCOSE 246*  BUN 15  CREATININE 1.15*  CALCIUM 9.4    Lipid Panel:     Component Value Date/Time   CHOL 174 10/09/2015  0431   TRIG 108 10/09/2015 0431   HDL 50 10/09/2015 0431   CHOLHDL 3.5 10/09/2015 0431   VLDL 22 10/09/2015 0431   LDLCALC 102 (H) 10/09/2015 0431   HgbA1c:  Lab Results  Component Value Date   HGBA1C 8.8 (H) 07/31/2015   Urine Drug Screen:     Component Value Date/Time   LABOPIA NONE DETECTED 07/31/2015 1226   COCAINSCRNUR NONE DETECTED 07/31/2015 1226   LABBENZ NONE DETECTED 07/31/2015 1226   AMPHETMU NONE DETECTED 07/31/2015 1226   THCU NONE DETECTED 07/31/2015 1226   LABBARB NONE DETECTED 07/31/2015 1226      IMAGING I have personally reviewed the radiological images below and agree with the radiology interpretations.  Ct Angio Head and Neck W Or Wo Contrast 10/08/2015 1. No acute arterial finding.  2. Intracranial atherosclerosis with notable high-grade left P2 segment stenosis.  3. Mild atherosclerosis of the neck without stenosis.  4. Advanced chronic microvascular disease. Known acute brainstem infarct without evidence of progression or hemorrhage.  5. Small layering pleural effusions.   Dg Chest 2 View 10/08/2015 No active cardiopulmonary disease.  Ct Head Wo Contrast 10/08/2015 1. No acute intracranial abnormalities.  2. Chronic microvascular disease and brain atrophy.   Mr Brain Wo Contrast 10/08/2015 1. Acute 7 mm nonhemorrhagic infarct within the posterior inferior left midbrain and pons on the left.  2. Multiple more remote lacunar infarcts are present throughout the lower midbrain and upper pons bilaterally.  3. Multiple remote lacunar infarcts are present throughout  the basal ganglia.  4. Advanced diffuse white matter disease.  5. Multiple foci of susceptibility throughout the brainstem and bilateral cerebral hemispheres suggesting underlying vasculitis.   TTE - pending   PHYSICAL EXAM General - pleasant, soft-spoken female with mild dysarthria in no acute distress. Heart - Regular rate and rhythm - no murmer Lungs - Clear to auscultation Abdomen  - Soft - non tender Extremities - Distal pulses intact - trace edema Skin - Warm and dry  Mental Status: Alert, oriented, thought content appropriate.  Speech fluent without evidence of aphasia.  Able to follow 3 step commands without difficulty. Cranial Nerves: II: Discs not visualized; Visual fields grossly normal, pupils equal, round, reactive to light and accommodation III,IV, VI: ptosis not present, extra-ocular motions intact bilaterally V,VII: Slight left lower facial droop, facial light touch sensation mildly decreased on the right. VIII: hearing normal bilaterally IX,X: gag reflex present XI: bilateral shoulder shrug XII: midline tongue extension Motor: Right :            Upper extremity   5/5                                                                              Left:     Upper extremity   5/5                       Lower extremity   5/5                                                                                          Lower extremity   5/5 Tone and bulk: normal tone throughout; no atrophy noted Sensory: Decreased sensation to light touch in the RUE. Symmetrical BLEs. Deep Tendon Reflexes: 2+ and symmetric throughout Plantars: Right: downgoing                                                              Left: downgoing Cerebellar: normal finger-to-nose, normal heel-to-shin, mild difficulty with rapid alternating movements on the right. Gait: Not tested   ASSESSMENT/PLAN [name] is a 45 y.o. female with history of diabetes mellitus, hypertension, previous stroke, hyperlipidemia, and history of a thalamic hemorrhage, presenting with right-sided numbness. She did not receive IV t-PA due to late presentation.   Stroke:  Left pontine infarct secondary to small vessel disease with multiple uncontrolled stroke risk factors. However, due to extensive microbleds, lacunar infarcts, significant white matter ischemic changes at such young age, single gene mutation  associated vasculopathy such as COL4A1 and COL4A2 and CADASIL and CARASIL need to be considered. Ideally, genetic testing would be great  tool.   Resultant  Right sided numbness  MRI - Acute infarct within the posterior inferior left midbrain and pons on the left with multiple remote infarcts. Severe diffuse microbleds and ischemic white matter changes.   CTA H&N - High-grade left P2 segment stenosis.   2D Echo - pending  LDL - 102  HgbA1c pending, was 8.8 on 07/31/15   Need to consider genetic testing as outpt.   VTE prophylaxis - Lovenox Diet heart healthy/carb modified Room service appropriate? Yes; Fluid consistency: Thin  No antithrombotic prior to admission, now on aspirin 81 mg daily. Agree with baby ASA but pt has high risk for recurrent ICH.   Patient counseled to be compliant with her antithrombotic medications  Ongoing aggressive stroke risk factor management  Therapy recommendations: No follow-up physical therapy recommended. OT evaluation pending.  Disposition: Pending  Hypertension  Blood pressure tends to run high.  Non compliance with medication at home  Permissive hypertension (OK if < 220/120) but gradually normalize in 5-7 days.  Long-term BP goal normotensive  Hyperlipidemia  Home meds: Mevacor 40 mg daily resumed in hospital  LDL 102, goal < 70  Now on Pravachol 40 mg daily  Continue statin at discharge  Diabetes  HgbA1c pending, 8.8 in 07/2015 goal < 7.0  Uncontrolled  Not compliant with insulin  Close follow up with PCP  Other Stroke Risk Factors  Hx stroke/TIA - 05/2014 right thalamic ICH - MRI showed extensive microbleds throughout, multiple lacunar infarcts, and significant WM ischemic changes - need to consider genetic testing as outpt  Other Active Problems    Hospital day # 1  Neurology will sign off. Please call with questions. Pt will follow up with Dr. Roda Shutters at [facility] in about 2 months. Thanks for the consult.  Marvel Plan, MD  PhD Stroke Neurology 10/09/2015 2:36 PM    To contact Stroke Continuity provider, please refer to [email or web address]. After hours, contact General Neurology

## 2015-10-09 NOTE — Evaluation (Signed)
Occupational Therapy Evaluation Patient Details Name: Manfred ShirtsMarva N Lough MRN: [id number] DOB: 04/03/1970 Today's Date: 10/09/2015    History of Present Illness Patient is a 45 y/o female with hx of CVA, HTN, DM presents with right numbness. MRI- + acute left midbrain and pons infarct; remote lacunar infarcts lower midbrain and upper pons bilaterally, multiple remote lacunar infarcts are present throughout the basal ganglia.    Clinical Impression   Patient evaluated by Occupational Therapy with no further acute OT needs identified. All education has been completed and the patient has no further questions. See below for any follow-up Occupational Therapy or equipment needs. OT to sign off. Thank you for referral.   RECOMMEND CM: address community resources for low vision/ resources for the blind due to progression of visual deficits.      Follow Up Recommendations  No OT follow up    Equipment Recommendations  None recommended by OT    Recommendations for Other Services Other (comment) (CM to give information on LOW VISION community resources)     Precautions / Restrictions        Mobility Bed Mobility               General bed mobility comments: inchair on arrival  Transfers Overall transfer level: Modified independent                    Balance Overall balance assessment: Needs assistance Sitting-balance support: No upper extremity supported;Feet supported Sitting balance-Leahy Scale: Normal     Standing balance support: During functional activity Standing balance-Leahy Scale: Good               High level balance activites: Direction changes;Turns;Sudden stops;Head turns;Backward walking High Level Balance Comments: Tolerated above with no deviations in gait pattern. Able to perform 360 degree turns no LOB. Standardized Balance Assessment Standardized Balance Assessment : Dynamic Gait Index   Dynamic Gait Index Level Surface: Normal Change in  Gait Speed: Normal Gait with Horizontal Head Turns: Normal Gait with Vertical Head Turns: Normal Gait and Pivot Turn: Mild Impairment Step Over Obstacle: Mild Impairment Step Around Obstacles: Normal Steps: Mild Impairment Total Score: 21      ADL Overall ADL's : Needs assistance/impaired Eating/Feeding: Modified independent   Grooming: Wash/dry hands;Wash/dry face;Modified independent   Upper Body Bathing: Modified independent   Lower Body Bathing: Modified independent           Toilet Transfer: Modified Independent       Tub/ Shower Transfer: Minimal assistance Tub/Shower Transfer Details (indicate cue type and reason): Educated on fall risk exiting the tub this session and patient agrees to shower in the evenings with spouse (A). pt with LOB reaching for therapist upon exiting the tub but able to static stand with vision occlusion and reaching to simulate faucet in the tub supervision level  Functional mobility during ADLs: Modified independent General ADL Comments: Pt reports at baseline having visual deficits.      Vision Additional Comments: patient with lens and hand held magnifier present in room. 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 Pt has low visin baseline.    Perception     Praxis      Pertinent Vitals/Pain Pain Assessment: No/denies pain     Hand Dominance Right   Extremity/Trunk Assessment Upper Extremity Assessment Upper Extremity Assessment: LUE deficits/detail LUE Deficits / Details: residual weakness , grasp brunstorm IV    Lower Extremity Assessment Lower Extremity Assessment: Defer to PT  evaluation   Cervical / Trunk Assessment Cervical / Trunk Assessment: Normal   Communication Communication Communication: Expressive difficulties   Cognition Arousal/Alertness: Awake/alert Behavior During Therapy: WFL for tasks assessed/performed Overall Cognitive Status: Within Functional Limits for tasks assessed                      General Comments       Exercises       Shoulder Instructions      Home Living Family/patient expects to be discharged to:: Private residence Living Arrangements: Spouse/significant other Available Help at Discharge: Family;Available PRN/intermittently Type of Home: House Home Access: Level entry     Home Layout: Two level;Bed/bath upstairs Alternate Level Stairs-Number of Steps: 12 Alternate Level Stairs-Rails: Right Bathroom Shower/Tub: Tub/shower unit Shower/tub characteristics: Engineer, building services: Standard     Home Equipment: None      Lives With: Spouse    Prior Functioning/Environment Level of Independence: Independent             OT Diagnosis:     OT Problem List:     OT Treatment/Interventions:      OT Goals(Current goals can be found in the care plan section) Acute Rehab OT Goals Patient Stated Goal: to go home  OT Frequency:     Barriers to D/C:            Co-evaluation              End of Session Equipment Utilized During Treatment: Gait belt Nurse Communication: Mobility status;Precautions  Activity Tolerance: Patient tolerated treatment well Patient left: in chair;with call bell/phone within reach   Time: 0913-0940 OT Time Calculation (min): 27 min Charges:  OT General Charges $OT Visit: 1 Procedure OT Evaluation $OT Eval Moderate Complexity: 1 Procedure OT Treatments $Self Care/Home Management : 8-22 mins G-Codes:    Boone Master B 10/10/2015, 3:17 PM   Mateo Flow   OTR/L Pager: [phone] Office: [phone] .

## 2015-10-09 NOTE — Evaluation (Signed)
Physical Therapy Evaluation Patient Details Name: Stephanie Lloyd MRN: [id number] DOB: [birth year] Today's Date: 10/09/2015   History of Present Illness  Patient is a 45 y/o female with hx of CVA, HTN, DM presents with right numbness. MRI- + acute left midbrain and pons infarct; remote lacunar infarcts lower midbrain and upper pons bilaterally, multiple remote lacunar infarcts are present throughout the basal ganglia.   Clinical Impression  Patient presents with right sided numbness s/p above. Tolerated gait training and stair training with supervision-Mod I for safety. Scored 21/24 on DGI demonstrating pt is not high fall risk. Pt has support at home. Encouraged mobility. Discussed signs/symptoms of CVA. Pt does not require further skilled therapy services. Discharge from therapy.    Follow Up Recommendations No PT follow up;Supervision - Intermittent    Equipment Recommendations  None recommended by PT    Recommendations for Other Services       Precautions / Restrictions Precautions Precautions: Fall Restrictions Weight Bearing Restrictions: No      Mobility  Bed Mobility               General bed mobility comments: in chair on arrival  Transfers Overall transfer level: Modified independent Equipment used: None             General transfer comment: No assist needed. Safe and steady.  Ambulation/Gait Ambulation/Gait assistance: Supervision Ambulation Distance (Feet): 400 Feet Assistive device: None Gait Pattern/deviations: Step-through pattern;Decreased stride length Gait velocity: decreased   General Gait Details: Slow, steady gait. Tolerated higher level balance challenges without difficulty. See balance section.  Stairs Stairs: Yes Stairs assistance: Modified independent (Device/Increase time) Stair Management: One rail Right;Alternating pattern;Step to pattern Number of Stairs: 13 General stair comments: Alternating steps to ascend and step to  pattern to descend steps.  Wheelchair Mobility    Modified Rankin (Stroke Patients Only) Modified Rankin (Stroke Patients Only) Pre-Morbid Rankin Score: Moderate disability Modified Rankin: Moderate disability     Balance Overall balance assessment: Needs assistance Sitting-balance support: Feet supported;No upper extremity supported Sitting balance-Leahy Scale: Good     Standing balance support: During functional activity Standing balance-Leahy Scale: Good               High level balance activites: Direction changes;Turns;Sudden stops;Head turns;Backward walking High Level Balance Comments: Tolerated above with no deviations in gait pattern. Able to perform 360 degree turns no LOB. Standardized Balance Assessment Standardized Balance Assessment : Dynamic Gait Index   Dynamic Gait Index Level Surface: Normal Change in Gait Speed: Normal Gait with Horizontal Head Turns: Normal Gait with Vertical Head Turns: Normal Gait and Pivot Turn: Mild Impairment Step Over Obstacle: Mild Impairment Step Around Obstacles: Normal Steps: Mild Impairment Total Score: 21       Pertinent Vitals/Pain Pain Assessment: No/denies pain    Home Living Family/patient expects to be discharged to:: Private residence Living Arrangements: Spouse/significant other Available Help at Discharge: Family;Available PRN/intermittently Type of Home: House Home Access: Level entry     Home Layout: Two level;Bed/bath upstairs Home Equipment: None      Prior Function Level of Independence: Independent               Hand Dominance   Dominant Hand: Right    Extremity/Trunk Assessment   Upper Extremity Assessment: Defer to OT evaluation           Lower Extremity Assessment: Overall WFL for tasks assessed (Decreased sensation RLE but proprioception WFL.)      Cervical / Trunk  Assessment: Normal  Communication   Communication: Expressive difficulties (slurred speech and self  reported word finding difficulties.)  Cognition Arousal/Alertness: Awake/alert Behavior During Therapy: WFL for tasks assessed/performed Overall Cognitive Status: Within Functional Limits for tasks assessed                      General Comments      Exercises        Assessment/Plan    PT Assessment Patent does not need any further PT services  PT Diagnosis Difficulty walking   PT Problem List    PT Treatment Interventions     PT Goals (Current goals can be found in the Care Plan section) Acute Rehab PT Goals Patient Stated Goal: to go home PT Goal Formulation: All assessment and education complete, DC therapy    Frequency     Barriers to discharge        Co-evaluation               End of Session Equipment Utilized During Treatment: Gait belt Activity Tolerance: Patient tolerated treatment well Patient left: in chair;with call bell/phone within reach Nurse Communication: Mobility status         Time: [id number] PT Time Calculation (min) (ACUTE ONLY): 15 min   Charges:   PT Evaluation $PT Eval Moderate Complexity: 1 Procedure     PT G Codes:        [name] A [name] 10/09/2015, 11:20 AM [name], PT, DPT [phone]

## 2015-10-09 NOTE — Progress Notes (Signed)
OT NOTE  Please have Case manager assist patient with community resources for low vision. Pt with progression visual deficits and pending medicaid status. Pt could benefit from all the education and resources we can provide this acute stay.  Resources: for the blind, low vision   Mateo FlowJones, Brynn   OTR/L Pager: [phone] Office: [phone] .

## 2015-10-09 NOTE — Care Management Note (Signed)
Case Management Note  Patient Details  Name: Manfred ShirtsMarva N Justice MRN: [id number] Date of Birth: 10/26/1970  Subjective/Objective:       Pt admitted with CVA. Pt has PCP but no insurance. Pt has had issues affording medications in the past per RN.   Action/Plan: Awaiting PT/OT recommendations. CM will follow for d/c needs and medications needs.   Expected Discharge Date:                  Expected Discharge Plan:  Home/Self Care  In-House Referral:     Discharge planning Services     Post Acute Care Choice:    Choice offered to:     DME Arranged:    DME Agency:     HH Arranged:    HH Agency:     Status of Service:  In process, will continue to follow  If discussed at Long Length of Stay Meetings, dates discussed:    Additional Comments:  Kermit BaloKelli F Mart Colpitts, RN 10/09/2015, 11:27 AM

## 2015-10-09 NOTE — Telephone Encounter (Signed)
Form received and forwarded to provider for review and signature.  Form reviewed and signed by provider.  Form mailed as requested by patient.

## 2015-10-10 DIAGNOSIS — E1159 Type 2 diabetes mellitus with other circulatory complications: Secondary | ICD-10-CM

## 2015-10-10 LAB — GLUCOSE, CAPILLARY
Glucose-Capillary: 119 mg/dL — ABNORMAL HIGH (ref 65–99)
Glucose-Capillary: 127 mg/dL — ABNORMAL HIGH (ref 65–99)
Glucose-Capillary: 170 mg/dL — ABNORMAL HIGH (ref 65–99)

## 2015-10-10 LAB — HEMOGLOBIN A1C
HEMOGLOBIN A1C: 8.5 % — AB (ref 4.8–5.6)
MEAN PLASMA GLUCOSE: 197 mg/dL

## 2015-10-10 MED ORDER — PRAVASTATIN SODIUM 40 MG PO TABS: 40.0000 mg | ORAL_TABLET | Freq: Every day | ORAL | 0 refills | Status: DC

## 2015-10-10 MED ORDER — INSULIN NPH ISOPHANE & REGULAR (70-30) 100 UNIT/ML ~~LOC~~ SUSP: 15.0000 [IU] | Freq: Two times a day (BID) | SUBCUTANEOUS | 0 refills | Status: DC

## 2015-10-10 MED ORDER — CARVEDILOL 25 MG PO TABS: 25.0000 mg | ORAL_TABLET | Freq: Two times a day (BID) | ORAL | 0 refills | Status: DC

## 2015-10-10 MED ORDER — ASPIRIN 81 MG PO TBEC: 81.0000 mg | DELAYED_RELEASE_TABLET | Freq: Every day | ORAL | 0 refills | Status: DC

## 2015-10-10 MED ORDER — BENAZEPRIL HCL 40 MG PO TABS: 40.0000 mg | ORAL_TABLET | Freq: Every day | ORAL | 0 refills | Status: DC

## 2015-10-10 NOTE — Progress Notes (Signed)
Pt. Given discharge instructions, the patient and husband verbalized understanding regarding signs and symptoms of stroke and what to do if they have these symptoms (call 911.) Also reinforced hypoglycemia/hyperglycemia protocols. Pt. Will be discharged shortly via wheelchair.

## 2015-10-10 NOTE — Progress Notes (Signed)
Pt. Verbalized to this writer that she is already aware and confident with giving insulin and that her husband also knows how to draw up and give her insulin. No further insulin educational needs warranted, pt. Verbalized full understanding of insulin administration.

## 2015-10-10 NOTE — Care Management Note (Signed)
Case Management Note  Patient Details  Name: DELORUS LANGWELL MRN: [id number] Date of Birth: 02/01/1970  Subjective/Objective:                    Action/Plan: CM has met with Mrs Espinoza several times over the past two days to assist her with medications at home. Pt does not have insurance and states she has had trouble affording some of her medications especially her insulin. She goes to [facility] for her PCP and she states they try and make sure her medications are on the $4 list at [facility]. Pt is discharging home today and her statin has changed. CM provided her with a coupon for this medication. MD is also discharging her back home on 70/30 insulin. CM provided her a few different coupons for this medication along with information on the $9 CBG meter at [facility] and the $17/ 100 strips, for the meter, at [facility]. The lancet are less than $2 a box and CM provided her a coupon for insulin syringes for home.   Expected Discharge Date:                  Expected Discharge Plan:  Home/Self Care  In-House Referral:     Discharge planning Services     Post Acute Care Choice:    Choice offered to:     DME Arranged:    DME Agency:     HH Arranged:    HH Agency:     Status of Service:  In process, will continue to follow  If discussed at Long Length of Stay Meetings, dates discussed:    Additional Comments:  Pollie Friar, RN 10/10/2015, 1:59 PM

## 2015-10-10 NOTE — Discharge Summary (Signed)
Physician Discharge Summary  Stephanie Lloyd KGM:[id number] DOB: 1971-01-14  PCP: Ann Held, DO  Admit date: 10/08/2015 Discharge date: 10/10/2015  Admitted From: Home Disposition:  Home  Recommendations for Outpatient Follow-up:  1. Dr. Roma Schanz, PCP in 5 days. 2. Dr. Rosalin Hawking, Neurology in 2 months. 3. Recommend outpatient Opthalmology consultation.  Home Health: None Equipment/Devices: None    Discharge Condition: Improved and stable.  CODE STATUS: Full  Diet recommendation: Heart Health & Diabetic Diet.  Discharge Diagnoses:  Active Problems:   Diabetes mellitus ([place])   Type 2 diabetes mellitus with other circulatory complications (HCC)   Hyperlipidemia   Hypertensive urgency   Focal infarction of brain ([place])   Stroke (cerebrum) ([place])   Brief/Interim Summary: 45 y.o.female, married, lives with her spouse, independent of activities of daily living, right-handed, PMH of previous stroke with residual dysarthria which gets worse when she's tired and left-sided weakness, long-standing type II DM/IDDM and hypertension, HLD, immune deficiency disorder, thalamic hemorrhage, medication noncompliance (has not taken her insulin's in a month and antihypertensives since last week) presented to [facility] ED on 10/08/15 with complaints of right-sided numbness and weakness which started on 10/17/15 at approximately 1:30 PM. MRI brain confirmed acute 7 mm nonhemorrhagic infarct within the posterior inferior left midbrain and pons on the left. Neurology was consulted. Admitted for stroke evaluation and management.   Assessment & Plan:   Acute left brain stroke: Acute 7 mm nonhemorrhagic infarct within the posterior inferior left midbrain and pons on the left - Resultant right hemiparesis and worsening dysarthria. Improved. - Etiology: Secondary to small vessel disease with multiple uncontrolled stroke risk factors. As per neurology, however due to extensive microbleed's,  lacunar infarcts, significant white matter ischemic changes at such a young age, single gene mutation associated vasculopathy such as COL4A1, COL4A2, CADASIL & CARASIL need to be considered, ideally genetic testing would be agreed to and can be pursued as outpatient. - CT brain 10/08/15: No acute intracranial abnormalities. - MRI brain 10/08/15: Acute 7 mm nonhemorrhagic infarct within the posterior inferior left midbrain and pons on the left. Multiple old infarcts bilaterally. Advanced diffuse white matter disease.  - CTA head: Intracranial atherosclerosis with notable high-grade left P2 segment stenosis  - CTA neck: Mild atherosclerosis of the neck without stenosis. -  2-D echo 06/23/14: LVEF 60-65 percent and no cardiac source of emboli was identified. Repeat echo 10/09/15: LVEF 65-70 percent and grade 2 diastolic dysfunction. Discussed echo report, I.e dynamic obstruction with a cardiologist and was told that it was mild and to continue BB's and avoid vasodilators. - LDL: 102. Statin changed. - Hemoglobin A1c: 8.5 - Patient not on antiplatelets prior to admission. - Neurology consultation and stroke MD follow-up appreciated. Discussed with Dr. Erlinda Hong 10/09/15. Aspirin 81 MG daily but the patient has high risk for recurrent ICH. - Therapies: No PT or OT follow-up . - Outpatient follow-up with neurology in 2 months.  Hypertensive urgency - Presented with initial blood pressure of 256/123 - Allow for permissive hypertension given recent acute stroke. Treat for BP >220/120 - Continue home dose of carvedilol and benazepril. - Likely has chronic severely uncontrolled hypertension. Complicated by noncompliance: as evidenced by improved blood pressure control on her home medications. Not sure if workup for secondary causes has been done as outpatient and may be considered  .  Uncontrolled type II DM/IDDM - Patient has not taken insulin's for 1 month> lacks insurance and issues with affordability. Case  management consulted and  has provided resources to procure same. - Hemoglobin A1c: 8.5 - In the hospital she was treated with Lantus and SSI with good inpatient control. At discharge she will go back on her home regimen of Relion 70/30 insulin. She has been extensively counseled regarding compliance with all aspects of her medical care and she verbalizes understanding. Insulin doses can be adjusted as outpatient.  Hyperlipidemia - LDL: 102. Statins were changed to pravastatin due to new acute stroke. Case management assisted with meds.  Medication noncompliance - Counseled.   Consultants:   Neurology  Procedures:   2-D echo 10/09/15: Study Conclusions  - Left ventricle: The cavity size was normal. There was moderate   concentric hypertrophy. Systolic function was vigorous. The   estimated ejection fraction was in the range of 65% to 70%. There   was dynamic obstruction at restin the mid cavity, with a peak   velocity of 200 cm/sec and a peak gradient of 16 mm Hg. Wall   motion was normal; there were no regional wall motion   abnormalities. Features are consistent with a pseudonormal left   ventricular filling pattern, with concomitant abnormal relaxation   and increased filling pressure (grade 2 diastolic dysfunction).   Discharge Instructions  Discharge Instructions    Ambulatory referral to Neurology    Complete by:  As directed   Pt will follow up with Dr. Erlinda Hong at [facility] in about 2 months. Thanks.   Call MD for:    Complete by:  As directed   Strokelike symptoms.   Diet - low sodium heart healthy    Complete by:  As directed   Diet Carb Modified    Complete by:  As directed   Increase activity slowly    Complete by:  As directed       Medication List    STOP taking these medications   lovastatin 20 MG tablet Commonly known as:  MEVACOR Replaced by:  pravastatin 40 MG tablet     TAKE these medications   ACCU-CHEK FASTCLIX LANCETS Misc Check blood sugar twice  daily. Dx E:11.9   ACCU-CHEK GUIDE w/Device Kit 1 Device by Does not apply route as directed. Check blood sugar daily   aspirin 81 MG EC tablet Take 1 tablet (81 mg total) by mouth daily.   benazepril 40 MG tablet Commonly known as:  LOTENSIN Take 1 tablet (40 mg total) by mouth daily.   carvedilol 25 MG tablet Commonly known as:  COREG Take 1 tablet (25 mg total) by mouth 2 (two) times daily with a meal.   glucose blood test strip Commonly known as:  ACCU-CHEK GUIDE Check blood sugar twice daily. Dx E:11.9   insulin NPH-regular Human (70-30) 100 UNIT/ML injection Commonly known as:  NOVOLIN 70/30 RELION Inject 15 Units into the skin 2 (two) times daily with a meal. What changed:  additional instructions   pravastatin 40 MG tablet Commonly known as:  PRAVACHOL Take 1 tablet (40 mg total) by mouth daily at 6 PM. Replaces:  lovastatin 20 MG tablet      Follow-up Information    Xu,Jindong, MD Follow up in 2 month(s).   Specialty:  Neurology Contact information: [address] [place] [address] [place], [place]. Schedule an appointment as soon as possible for a visit in 5 day(s).   Specialty:  Family Medicine Why:  Posthospital follow-up. Evaluate and adjust diabetic and hypertensive medications as needed. Contact information:  [address] [place] [address] [phone]          Allergies  Allergen Reactions  . Metformin And Related Diarrhea      Procedures/Studies: Ct Angio Head W Or Wo Contrast  Result Date: 10/08/2015 CLINICAL DATA:  Evaluate stroke. EXAM: CT ANGIOGRAPHY HEAD AND NECK TECHNIQUE: Multidetector CT imaging of the head and neck was performed using the standard protocol during bolus administration of intravenous contrast. Multiplanar CT image reconstructions and MIPs were obtained to evaluate the vascular anatomy. Carotid stenosis measurements (when applicable) are obtained  utilizing NASCET criteria, using the distal internal carotid diameter as the denominator. CONTRAST:  50 cc Isovue 370 intravenous COMPARISON:  Brain MRI and head CT from earlier today. FINDINGS: CTA NECK Aortic arch: Minimal atheromatous changes.  Three vessel branching. Right carotid system: Noncalcified atheromatous thickening greatest at the bifurcation. No stenosis or signs of dissection. Left carotid system: Mild atheromatous noncalcified wall thickening greatest at the bifurcation. No stenosis or signs of dissection. Vertebral arteries:No proximal subclavian artery stenosis. Left dominant vertebral arteries. Both vertebral arteries are smooth and widely patent to the dura. Skeleton: No acute or aggressive process. Other neck: No incidental mass or adenopathy. Upper chest: Small layering pleural effusions are partly seen. Small cluster of nodules in the right upper lobe, an inflammatory pattern CTA HEAD Anterior circulation: Symmetric carotid arteries and branching. Mild atherosclerotic calcification on the siphons. Mild to moderate right A1 origin stenosis. Mild atheromatous narrowing of left ACA and bilateral MCA branches. No major vessel occlusion. Negative for aneurysm. Posterior circulation: Left dominant with right vertebral artery ending in PICA. Sizable posterior communicating arteries with small smooth basilar. Bilateral visible AICA with probable stenosis at the right origin, but limited by small vessel size. Focal high-grade left P2 segment stenosis. Negative for aneurysm. No major vessel occlusion. Venous sinuses: Patent Anatomic variants: None significant Delayed phase: No parenchymal enhancement. Severe chronic microvascular disease with multiple chronic infarcts in the brain stem and deep gray nuclei with confluent ischemic gliosis in the cerebral white matter. No evidence of progression or hemorrhage at the known acute left brainstem infarct. Low brain volume for age. Findings likely related to  patient's history of hypertension and diabetes. No notable temporal involvement to implicate CADASIL. IMPRESSION: 1. No acute arterial finding. 2. Intracranial atherosclerosis with notable high-grade left P2 segment stenosis. 3. Mild atherosclerosis of the neck without stenosis. 4. Advanced chronic microvascular disease. Known acute brainstem infarct without evidence of progression or hemorrhage. 5. Small layering pleural effusions. Electronically Signed   By: Monte Fantasia M.D.   On: 10/08/2015 22:00   Dg Chest 2 View  Result Date: 10/08/2015 CLINICAL DATA:  Numbness to entire right-sided body. EXAM: CHEST  2 VIEW COMPARISON:  None FINDINGS: The heart size and mediastinal contours are within normal limits. Both lungs are clear. The visualized skeletal structures are unremarkable. IMPRESSION: No active cardiopulmonary disease. Electronically Signed   By: Kerby Moors M.D.   On: 10/08/2015 21:12   Ct Head Wo Contrast  Result Date: 10/08/2015 CLINICAL DATA:  Right-sided numbness and generalize weakness EXAM: CT HEAD WITHOUT CONTRAST TECHNIQUE: Contiguous axial images were obtained from the base of the skull through the vertex without intravenous contrast. COMPARISON:  None. FINDINGS: Brain: No evidence of acute infarction, hemorrhage, hydrocephalus, extra-axial collection or mass lesion/mass effect.There is patchy low attenuation within the subcortical and periventricular white matter compatible with chronic microvascular disease. There is several chronic lacunar infarcts identified within the right basal  ganglia. Vascular: No hyperdense vessel or unexpected calcification. Skull: Normal. Negative for fracture or focal lesion. Sinuses/Orbits: No acute finding. Other: None. IMPRESSION: 1. No acute intracranial abnormalities. 2. Chronic microvascular disease and brain atrophy. Electronically Signed   By: Kerby Moors M.D.   On: 10/08/2015 14:51   Ct Angio Neck W Or Wo Contrast  Result Date:  10/08/2015 CLINICAL DATA:  Evaluate stroke. EXAM: CT ANGIOGRAPHY HEAD AND NECK TECHNIQUE: Multidetector CT imaging of the head and neck was performed using the standard protocol during bolus administration of intravenous contrast. Multiplanar CT image reconstructions and MIPs were obtained to evaluate the vascular anatomy. Carotid stenosis measurements (when applicable) are obtained utilizing NASCET criteria, using the distal internal carotid diameter as the denominator. CONTRAST:  50 cc Isovue 370 intravenous COMPARISON:  Brain MRI and head CT from earlier today. FINDINGS: CTA NECK Aortic arch: Minimal atheromatous changes.  Three vessel branching. Right carotid system: Noncalcified atheromatous thickening greatest at the bifurcation. No stenosis or signs of dissection. Left carotid system: Mild atheromatous noncalcified wall thickening greatest at the bifurcation. No stenosis or signs of dissection. Vertebral arteries:No proximal subclavian artery stenosis. Left dominant vertebral arteries. Both vertebral arteries are smooth and widely patent to the dura. Skeleton: No acute or aggressive process. Other neck: No incidental mass or adenopathy. Upper chest: Small layering pleural effusions are partly seen. Small cluster of nodules in the right upper lobe, an inflammatory pattern CTA HEAD Anterior circulation: Symmetric carotid arteries and branching. Mild atherosclerotic calcification on the siphons. Mild to moderate right A1 origin stenosis. Mild atheromatous narrowing of left ACA and bilateral MCA branches. No major vessel occlusion. Negative for aneurysm. Posterior circulation: Left dominant with right vertebral artery ending in PICA. Sizable posterior communicating arteries with small smooth basilar. Bilateral visible AICA with probable stenosis at the right origin, but limited by small vessel size. Focal high-grade left P2 segment stenosis. Negative for aneurysm. No major vessel occlusion. Venous sinuses: Patent  Anatomic variants: None significant Delayed phase: No parenchymal enhancement. Severe chronic microvascular disease with multiple chronic infarcts in the brain stem and deep gray nuclei with confluent ischemic gliosis in the cerebral white matter. No evidence of progression or hemorrhage at the known acute left brainstem infarct. Low brain volume for age. Findings likely related to patient's history of hypertension and diabetes. No notable temporal involvement to implicate CADASIL. IMPRESSION: 1. No acute arterial finding. 2. Intracranial atherosclerosis with notable high-grade left P2 segment stenosis. 3. Mild atherosclerosis of the neck without stenosis. 4. Advanced chronic microvascular disease. Known acute brainstem infarct without evidence of progression or hemorrhage. 5. Small layering pleural effusions. Electronically Signed   By: Monte Fantasia M.D.   On: 10/08/2015 22:00   Mr Brain Wo Contrast  Result Date: 10/08/2015 CLINICAL DATA:  Right-sided numbness and generalized weakness. EXAM: MRI HEAD WITHOUT CONTRAST TECHNIQUE: Multiplanar, multiecho pulse sequences of the brain and surrounding structures were obtained without intravenous contrast. COMPARISON:  CT head without contrast 10/08/2015 FINDINGS: Brain: The diffusion-weighted images demonstrate acute nonhemorrhagic infarct posteriorly within the inferior midbrain on the left. Multiple remote lacunar infarcts are present throughout the pons and lower midbrain. T2 changes are associated with the acute infarct as well. There multiple foci of susceptibility throughout the brainstem. Extensive punctate susceptibility artifacts are present throughout both cerebral hemispheres as well. Multiple remote lacunar infarcts are present in the basal ganglia bilaterally. Multiple foci of susceptibility are associated. No acute hemorrhage is present. The ventricles are proportionate to the degree of atrophy. No  significant extra-axial fluid collection is present.  Vascular: Flow is present in the major intracranial arteries. The right vertebral artery is hypoplastic. Skull and upper cervical spine: The skullbase is within normal limits. Midline sagittal images are otherwise unremarkable. The upper cervical spine is within normal limits. Sinuses/Orbits: Mild mucosal thickening is present in the ethmoid air cells bilaterally. The remaining paranasal sinuses and the mastoid air cells are clear. The globes and orbits are intact. Other: IMPRESSION: 1. Acute 7 mm nonhemorrhagic infarct within the posterior inferior left midbrain and pons on the left. 2. Multiple more remote lacunar infarcts are present throughout the lower midbrain and upper pons bilaterally. 3. Multiple remote lacunar infarcts are present throughout the basal ganglia. 4. Advanced diffuse white matter disease. 5. Multiple foci of susceptibility throughout the brainstem and bilateral cerebral hemispheres suggesting underlying vasculitis. Electronically Signed   By: San Morelle M.D.   On: 10/08/2015 16:59      Subjective: Overall feels better. Slurred speech and right-sided weakness have significantly improved. Numbness persists.  Discharge Exam:  Vitals:   10/10/15 0643 10/10/15 0855 10/10/15 1016 10/10/15 1324  BP: (!) 146/78 120/82 124/82 (!) 168/94  Pulse: (!) 52 68 60 62  Resp: '18 16 18 18  ' Temp:  97.5 F (36.4 C)  97.7 F (36.5 C)  TempSrc:  Oral  Oral  SpO2: 99% 100%  100%    General exam: Pleasant young female, sitting up comfortably in the chair this morning. Respiratory system: Clear to auscultation. Respiratory effort normal. Cardiovascular system: S1 & S2 heard, RRR. No JVD, murmurs, rubs, gallops or clicks. No pedal edema. Telemetry: Sinus rhythm in the 60s. Sinus bradycardia in the 50s, occasional in the high 40s overnight. Gastrointestinal system: Abdomen is nondistended, soft and nontender. No organomegaly or masses felt. Normal bowel sounds heard. Central nervous  system: Alert and oriented 3. Mild dysarthria which is better than on admission. Mild left lower facial weakness.  Extremities: Grade 4+ by 5 power in left limbs, grade 5 x 5 power in right limbs. Diminished touch over right half of body.  Skin: No rashes, lesions or ulcers Psychiatry: Judgement and insight appear normal. Mood & affect appropriate.    The results of significant diagnostics from this hospitalization (including imaging, microbiology, ancillary and laboratory) are listed below for reference.     Microbiology: No results found for this or any previous visit (from the past 240 hour(s)).   Labs: BNP (last 3 results) No results for input(s): BNP in the last 8760 hours. Basic Metabolic Panel:  Recent Labs Lab 10/08/15 1240  NA 137  K 4.1  CL 104  CO2 25  GLUCOSE 246*  BUN 15  CREATININE 1.15*  CALCIUM 9.4   Liver Function Tests: No results for input(s): AST, ALT, ALKPHOS, BILITOT, PROT, ALBUMIN in the last 168 hours. No results for input(s): LIPASE, AMYLASE in the last 168 hours. No results for input(s): AMMONIA in the last 168 hours. CBC:  Recent Labs Lab 10/08/15 1240  WBC 7.0  HGB 12.4  HCT 38.8  MCV 82.9  PLT 190   Cardiac Enzymes: No results for input(s): CKTOTAL, CKMB, CKMBINDEX, TROPONINI in the last 168 hours. BNP: Invalid input(s): POCBNP CBG:  Recent Labs Lab 10/09/15 1131 10/09/15 1705 10/09/15 2134 10/10/15 0641 10/10/15 1140  GLUCAP 168* 160* 98 119* 127*   D-Dimer No results for input(s): DDIMER in the last 72 hours. Hgb A1c  Recent Labs  10/09/15 0431  HGBA1C 8.5*   Lipid Profile  Recent Labs  10/09/15 0431  CHOL 174  HDL 50  LDLCALC 102*  TRIG 108  CHOLHDL 3.5      Time coordinating discharge: Over 30 minutes  SIGNED:  Vernell Leep, MD, FACP, FHM. [facility] Pager [phone]  If 7PM-7AM, please contact night-coverage [email or web address] Password [email or web address] 10/10/2015, 2:16 PM

## 2015-10-11 ENCOUNTER — Telehealth: Payer: Self-pay | Admitting: Behavioral Health

## 2015-10-11 NOTE — Telephone Encounter (Signed)
Unable to reach patient for TCM/Hospital Follow-up call. Left message for patient to return call when available.    

## 2015-10-12 NOTE — Telephone Encounter (Signed)
Left message x 2 for TCM/Hospital Follow-up call. 

## 2015-10-28 ENCOUNTER — Encounter (HOSPITAL_COMMUNITY): Payer: Self-pay | Admitting: Emergency Medicine

## 2015-10-28 ENCOUNTER — Emergency Department (HOSPITAL_COMMUNITY): Payer: Medicaid Other

## 2015-10-28 ENCOUNTER — Inpatient Hospital Stay (HOSPITAL_COMMUNITY)
Admission: EM | Admit: 2015-10-28 | Discharge: 2015-10-30 | DRG: 312 | Disposition: A | Discharge status: Home / Self Care | Payer: Medicaid Other | Attending: Internal Medicine | Admitting: Internal Medicine

## 2015-10-28 ENCOUNTER — Observation Stay (HOSPITAL_COMMUNITY): Payer: Medicaid Other

## 2015-10-28 DIAGNOSIS — I69354 Hemiplegia and hemiparesis following cerebral infarction affecting left non-dominant side: Secondary | ICD-10-CM

## 2015-10-28 DIAGNOSIS — I639 Cerebral infarction, unspecified: Secondary | ICD-10-CM | POA: Diagnosis present

## 2015-10-28 DIAGNOSIS — I69322 Dysarthria following cerebral infarction: Secondary | ICD-10-CM

## 2015-10-28 DIAGNOSIS — N182 Chronic kidney disease, stage 2 (mild): Secondary | ICD-10-CM | POA: Diagnosis present

## 2015-10-28 DIAGNOSIS — I130 Hypertensive heart and chronic kidney disease with heart failure and stage 1 through stage 4 chronic kidney disease, or unspecified chronic kidney disease: Secondary | ICD-10-CM | POA: Diagnosis present

## 2015-10-28 DIAGNOSIS — R0602 Shortness of breath: Secondary | ICD-10-CM

## 2015-10-28 DIAGNOSIS — Z794 Long term (current) use of insulin: Secondary | ICD-10-CM

## 2015-10-28 DIAGNOSIS — E119 Type 2 diabetes mellitus without complications: Secondary | ICD-10-CM

## 2015-10-28 DIAGNOSIS — E1122 Type 2 diabetes mellitus with diabetic chronic kidney disease: Secondary | ICD-10-CM | POA: Diagnosis present

## 2015-10-28 DIAGNOSIS — I10 Essential (primary) hypertension: Secondary | ICD-10-CM

## 2015-10-28 DIAGNOSIS — R2681 Unsteadiness on feet: Secondary | ICD-10-CM

## 2015-10-28 DIAGNOSIS — R55 Syncope and collapse: Principal | ICD-10-CM | POA: Diagnosis present

## 2015-10-28 DIAGNOSIS — E785 Hyperlipidemia, unspecified: Secondary | ICD-10-CM | POA: Diagnosis present

## 2015-10-28 DIAGNOSIS — D849 Immunodeficiency, unspecified: Secondary | ICD-10-CM | POA: Diagnosis present

## 2015-10-28 DIAGNOSIS — E1159 Type 2 diabetes mellitus with other circulatory complications: Secondary | ICD-10-CM

## 2015-10-28 DIAGNOSIS — G9340 Encephalopathy, unspecified: Secondary | ICD-10-CM

## 2015-10-28 DIAGNOSIS — I5030 Unspecified diastolic (congestive) heart failure: Secondary | ICD-10-CM | POA: Diagnosis present

## 2015-10-28 DIAGNOSIS — Z888 Allergy status to other drugs, medicaments and biological substances status: Secondary | ICD-10-CM

## 2015-10-28 DIAGNOSIS — I5189 Other ill-defined heart diseases: Secondary | ICD-10-CM | POA: Diagnosis present

## 2015-10-28 DIAGNOSIS — N179 Acute kidney failure, unspecified: Secondary | ICD-10-CM | POA: Diagnosis present

## 2015-10-28 HISTORY — DX: Other ill-defined heart diseases: I51.89

## 2015-10-28 LAB — BASIC METABOLIC PANEL
Anion gap: 11 (ref 5–15)
BUN: 14 mg/dL (ref 6–20)
CHLORIDE: 103 mmol/L (ref 101–111)
CO2: 20 mmol/L — ABNORMAL LOW (ref 22–32)
CREATININE: 1.4 mg/dL — AB (ref 0.44–1.00)
Calcium: 9.1 mg/dL (ref 8.9–10.3)
GFR calc Af Amer: 51 mL/min — ABNORMAL LOW (ref >60)
GFR calc non Af Amer: 44 mL/min — ABNORMAL LOW (ref >60)
GLUCOSE: 190 mg/dL — AB (ref 65–99)
POTASSIUM: 4.2 mmol/L (ref 3.5–5.1)
SODIUM: 134 mmol/L — AB (ref 135–145)

## 2015-10-28 LAB — URINALYSIS, ROUTINE W REFLEX MICROSCOPIC
GLUCOSE, UA: NEGATIVE mg/dL
KETONES UR: 15 mg/dL — AB
Nitrite: NEGATIVE
PH: 6 (ref 5.0–8.0)
Protein, ur: 30 mg/dL — AB
Specific Gravity, Urine: 1.029 (ref 1.005–1.030)

## 2015-10-28 LAB — VITAMIN B12: VITAMIN B 12: 495 pg/mL (ref 180–914)

## 2015-10-28 LAB — CBC WITH DIFFERENTIAL/PLATELET
Basophils Absolute: 0.1 10*3/uL (ref 0.0–0.1)
Basophils Relative: 0 %
EOS ABS: 0.2 10*3/uL (ref 0.0–0.7)
EOS PCT: 2 %
HCT: 45.6 % (ref 36.0–46.0)
HEMOGLOBIN: 14.2 g/dL (ref 12.0–15.0)
LYMPHS ABS: 1.7 10*3/uL (ref 0.7–4.0)
LYMPHS PCT: 15 %
MCH: 26.5 pg (ref 26.0–34.0)
MCHC: 31.1 g/dL (ref 30.0–36.0)
MCV: 85.2 fL (ref 78.0–100.0)
MONOS PCT: 4 %
Monocytes Absolute: 0.4 10*3/uL (ref 0.1–1.0)
NEUTROS PCT: 79 %
Neutro Abs: 8.7 10*3/uL — ABNORMAL HIGH (ref 1.7–7.7)
Platelets: 250 10*3/uL (ref 150–400)
RBC: 5.35 MIL/uL — AB (ref 3.87–5.11)
RDW: 13.3 % (ref 11.5–15.5)
WBC: 11.1 10*3/uL — ABNORMAL HIGH (ref 4.0–10.5)

## 2015-10-28 LAB — GLUCOSE, CAPILLARY
GLUCOSE-CAPILLARY: 218 mg/dL — AB (ref 65–99)
Glucose-Capillary: 139 mg/dL — ABNORMAL HIGH (ref 65–99)

## 2015-10-28 LAB — RAPID URINE DRUG SCREEN, HOSP PERFORMED
AMPHETAMINES: NOT DETECTED
BENZODIAZEPINES: NOT DETECTED
Barbiturates: NOT DETECTED
COCAINE: NOT DETECTED
OPIATES: NOT DETECTED
TETRAHYDROCANNABINOL: NOT DETECTED

## 2015-10-28 LAB — URINE MICROSCOPIC-ADD ON

## 2015-10-28 LAB — TROPONIN I

## 2015-10-28 LAB — PROTIME-INR
INR: 1.26
Prothrombin Time: 15.9 seconds — ABNORMAL HIGH (ref 11.4–15.2)

## 2015-10-28 LAB — I-STAT TROPONIN, ED: Troponin i, poc: 0 ng/mL (ref 0.00–0.08)

## 2015-10-28 LAB — APTT: aPTT: 29 seconds (ref 24–36)

## 2015-10-28 MED ORDER — ACETAMINOPHEN 650 MG RE SUPP: 650.0000 mg | Freq: Four times a day (QID) | RECTAL | Status: DC | PRN

## 2015-10-28 MED ORDER — SODIUM CHLORIDE 0.9% FLUSH
3.0000 mL | Freq: Two times a day (BID) | INTRAVENOUS | Status: DC
  Administered 2015-10-29 – 2015-10-30 (×2): 3 mL via INTRAVENOUS

## 2015-10-28 MED ORDER — HYDRALAZINE HCL 20 MG/ML IJ SOLN
5.0000 mg | INTRAMUSCULAR | Status: DC | PRN
  Administered 2015-10-29 (×2): 5 mg via INTRAVENOUS
  Filled 2015-10-28 (×2): qty 1

## 2015-10-28 MED ORDER — ACETAMINOPHEN 325 MG PO TABS
650.0000 mg | ORAL_TABLET | Freq: Four times a day (QID) | ORAL | Status: DC | PRN
  Administered 2015-10-29: 650 mg via ORAL
  Filled 2015-10-28: qty 2

## 2015-10-28 MED ORDER — SODIUM CHLORIDE 0.9 % IV SOLN
INTRAVENOUS | Status: AC
  Administered 2015-10-28 (×2): via INTRAVENOUS

## 2015-10-28 MED ORDER — PRAVASTATIN SODIUM 40 MG PO TABS
40.0000 mg | ORAL_TABLET | Freq: Every day | ORAL | Status: DC
  Administered 2015-10-28 – 2015-10-29 (×2): 40 mg via ORAL
  Filled 2015-10-28 (×2): qty 1

## 2015-10-28 MED ORDER — ONDANSETRON HCL 4 MG/2ML IJ SOLN
4.0000 mg | Freq: Four times a day (QID) | INTRAMUSCULAR | Status: DC | PRN
Start: 2015-10-28 — End: 2015-10-30

## 2015-10-28 MED ORDER — ASPIRIN EC 81 MG PO TBEC
81.0000 mg | DELAYED_RELEASE_TABLET | Freq: Every day | ORAL | Status: DC
Start: 2015-10-28 — End: 2015-10-30
  Administered 2015-10-28 – 2015-10-30 (×3): 81 mg via ORAL
  Filled 2015-10-28 (×3): qty 1

## 2015-10-28 MED ORDER — INSULIN ASPART 100 UNIT/ML ~~LOC~~ SOLN
0.0000 [IU] | Freq: Every day | SUBCUTANEOUS | Status: DC
  Administered 2015-10-28: 2 [IU] via SUBCUTANEOUS

## 2015-10-28 MED ORDER — ALUM & MAG HYDROXIDE-SIMETH 200-200-20 MG/5ML PO SUSP: 30.0000 mL | Freq: Four times a day (QID) | ORAL | Status: DC | PRN

## 2015-10-28 MED ORDER — SENNOSIDES-DOCUSATE SODIUM 8.6-50 MG PO TABS: 1.0000 | ORAL_TABLET | Freq: Every evening | ORAL | Status: DC | PRN

## 2015-10-28 MED ORDER — CARVEDILOL 12.5 MG PO TABS: 25.0000 mg | ORAL_TABLET | Freq: Two times a day (BID) | ORAL | Status: DC

## 2015-10-28 MED ORDER — ENOXAPARIN SODIUM 40 MG/0.4ML ~~LOC~~ SOLN
40.0000 mg | SUBCUTANEOUS | Status: DC
  Administered 2015-10-28 – 2015-10-30 (×3): 40 mg via SUBCUTANEOUS
  Filled 2015-10-28 (×3): qty 0.4

## 2015-10-28 MED ORDER — CARVEDILOL 12.5 MG PO TABS
25.0000 mg | ORAL_TABLET | Freq: Two times a day (BID) | ORAL | Status: DC
  Administered 2015-10-28 – 2015-10-30 (×4): 25 mg via ORAL
  Filled 2015-10-28 (×4): qty 2

## 2015-10-28 MED ORDER — ONDANSETRON HCL 4 MG PO TABS: 4.0000 mg | ORAL_TABLET | Freq: Four times a day (QID) | ORAL | Status: DC | PRN

## 2015-10-28 MED ORDER — ZOLPIDEM TARTRATE 5 MG PO TABS
5.0000 mg | ORAL_TABLET | Freq: Every evening | ORAL | Status: DC | PRN
  Administered 2015-10-28 – 2015-10-29 (×2): 5 mg via ORAL
  Filled 2015-10-28 (×2): qty 1

## 2015-10-28 MED ORDER — INSULIN ASPART 100 UNIT/ML ~~LOC~~ SOLN
0.0000 [IU] | Freq: Three times a day (TID) | SUBCUTANEOUS | Status: DC
  Administered 2015-10-28: 2 [IU] via SUBCUTANEOUS
  Administered 2015-10-29 – 2015-10-30 (×5): 3 [IU] via SUBCUTANEOUS

## 2015-10-28 NOTE — ED Provider Notes (Signed)
Pt is a 45 y.o. female who presents with  Chief Complaint  Patient presents with  . Weakness  The patient has a history of recurrent strokes. She was in the hospital earlier this month for a stroke. The patient states when she woke up this morning she had some generalized weakness and difficulty with her gait. This started about 8 or 8:30 this morning. She woke up prior to that time and felt fine.. Patient was concerned that she was having another stroke so she came to the ED.  Sx have resolved at this point.  Physical Exam  Constitutional: No distress.  HENT:  Head: Normocephalic and atraumatic.  Eyes: Conjunctivae are normal. Left eye exhibits no discharge. No scleral icterus.  Neck: No tracheal deviation present. No thyromegaly present.  Pulmonary/Chest: Effort normal and breath sounds normal. No stridor.  Abdominal: She exhibits no distension.  Musculoskeletal: She exhibits no tenderness or deformity.  Neurological: She is alert. She has normal sensation and intact cranial nerves. She is not agitated and not disoriented. She displays abnormal speech (mild speech difficulty). She displays no weakness and facial symmetry. No cranial nerve deficit or sensory deficit. She has a normal Finger-Nose-Finger Test. She shows no pronator drift. Coordination normal.  Skin: Skin is warm. No rash noted. She is not diaphoretic. No erythema.  Psychiatric: Affect normal.    Clinical Course     EKG Interpretation  Date/Time:  Saturday October 28 2015 10:02:51 EDT Ventricular Rate:  54 PR Interval:    QRS Duration: 88 QT Interval:  445 QTC Calculation: 422 R Axis:   30 Text Interpretation:  Sinus rhythm Probable left ventricular hypertrophy ST elev, probable normal early repol pattern No significant change since last tracing Confirmed by Jazlin Tapscott  MD-J, Dannica Bickham ([id number]) on 10/28/2015 10:19:09 AM      Case was discussed with neurology.  Doubts recurrent stroke.  Pt admitted for observation, further  eva  Syncope, unspecified syncope type     Medical screening examination/treatment/procedure(s) were conducted as a shared visit with non-physician practitioner(s) and myself.  I personally evaluated the patient during the encounter.       Linwood DibblesJon Emon Lance, MD 10/29/15 [phone]

## 2015-10-28 NOTE — ED Notes (Signed)
Pt eating her meal tray from cafeteria.

## 2015-10-28 NOTE — ED Notes (Signed)
Pt here for evaluation after an episode this morning upon standing of disorientation and weakness with diaphoresis. Pt has no complaints currently, reports she feels back to normal. Pt has hx of stroke with residual deficits including speech deficit and generalized weakness when walking, however ambulated to the bathroom with stand by assistance without difficulty earlier. PT did not take her morning BP meds. Coreg 25mg  given in ED. BP initially hypertensive with improvement in MAP since Coreg intake. Pt passed stroke swallow screen and just finished her lunch meal tray.

## 2015-10-28 NOTE — ED Triage Notes (Addendum)
Pt reports she woke up this morning feeling normal. Pt got up out of bed and went to the bathroom and began feeling disoriented and diaphoretic. Denies pain anywhere, diarrhea, shortness of breath or vomiting. Pt reports she feels better and does not have any symptoms anymore. Pt reports hx of stroke with residual deficits including speech deficits and generalized weakness while walking. Pt also reports being around person with viral illness yesterday.

## 2015-10-28 NOTE — ED Provider Notes (Signed)
[facility] DEPT Provider Note   CSN: [id number] Arrival date & time: 10/28/15  0904     History   Chief Complaint Chief Complaint  Patient presents with  . Weakness    HPI Stephanie Lloyd is a 45 y.o. female.  Stephanie Lloyd is a 45 y.o. 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 Patient awoke this morning and was in the bathroom when she had a wave of feeling disoriented, diaphoretic, and weak. Per family at bedside, pt had LOC, no jerking movements appreciated and no head trauma. Patient not responding coherently to questions for approximately 10-15 minutes. Patient reports feeling back to her baseline at this time. She has residual changes in vision, dysarthria, and facial droop from previous stroke. Denies fever, chest pain, shortness of breath, abdominal pain, N/V, dysuria, hematuria, numbness, weakness, headache, new slurred speech, new facial droop, visual disturbances. Patient reports she has been compliant with her medications. Per family at bedside, pt has had syncopal event in past they believe was associated with initial stroke.       Past Medical History:  Diagnosis Date  . Diabetes ([place])   . Diabetes mellitus without complication ([place])   . Diastolic dysfunction   . Hypertension   . Immune deficiency disorder ([place])   . Stroke [facility])     Patient Active Problem List   Diagnosis Date Noted  . Acute kidney injury ([place]) 10/28/2015  . Acute encephalopathy 10/28/2015  . Diastolic dysfunction   . Stroke (cerebrum) ([place])   . Focal infarction of brain ([place]) 10/08/2015  . Syncope 08/19/2015  . Hypertensive emergency 07/29/2015  . Hypertensive urgency 07/28/2015  . Hypokalemia 07/28/2015  . Accelerated hypertension 09/21/2014  . Type 2 diabetes mellitus with other  circulatory complications ([place]) 75/88/3254  . Hyperlipidemia 09/21/2014  . Thalamic hemorrhage with stroke ([place]) 07/29/2014  . Hyperlipidemia LDL goal <70 07/29/2014  . Hemorrhagic stroke ([place]) 06/22/2014  . ICH (intracerebral hemorrhage) ([place]) 06/22/2014  . Hypertension 05/13/2012  . Diabetes mellitus ([place]) 05/13/2012    No past surgical history on file.  OB History    Gravida Para Term Preterm AB Living   0 0 0 0 0     SAB TAB Ectopic Multiple Live Births   0 0 0           Home Medications    Prior to Admission medications   Medication Sig Start Date End Date Taking? Authorizing Provider  ACCU-CHEK FASTCLIX LANCETS MISC Check blood sugar twice daily. Dx E:11.9 09/08/15  Yes [name], DO  aspirin EC 81 MG EC tablet Take 1 tablet (81 mg total) by mouth daily. 10/10/15  Yes Modena Jansky, MD  benazepril (LOTENSIN) 40 MG tablet Take 1 tablet (40 mg total) by mouth daily. 10/10/15  Yes Modena Jansky, MD  Blood Glucose Monitoring Suppl (ACCU-CHEK GUIDE) w/Device KIT 1 Device by Does not apply route as directed. Check blood sugar daily 09/08/15  Yes [name], DO  carvedilol (COREG) 25 MG tablet Take 1 tablet (25 mg total) by mouth 2 (two) times daily with a meal. 10/10/15  Yes Modena Jansky, MD  glucose blood (ACCU-CHEK GUIDE) test strip Check blood sugar twice daily. Dx E:11.9 09/08/15  Yes [name], DO  insulin NPH-regular Human (NOVOLIN 70/30 RELION) (70-30) 100 UNIT/ML injection Inject 15 Units  into the skin 2 (two) times daily with a meal. 10/10/15  Yes Modena Jansky, MD  pravastatin (PRAVACHOL) 40 MG tablet Take 1 tablet (40 mg total) by mouth daily at 6 PM. 10/10/15  Yes Modena Jansky, MD    Family History No family history on file.  Social History Social History  Substance Use Topics  . Smoking status: Never Smoker  . Smokeless tobacco: Never Used  . Alcohol use No     Comment: occ     Allergies   Metformin and  related   Review of Systems Review of Systems  Constitutional: Positive for diaphoresis. Negative for chills and fever.  HENT: Negative for trouble swallowing.   Eyes: Negative for visual disturbance.  Respiratory: Negative for shortness of breath.   Cardiovascular: Negative for chest pain.  Gastrointestinal: Negative for abdominal pain, nausea and vomiting.  Genitourinary: Negative for dysuria and hematuria.  Musculoskeletal: Positive for arthralgias ( right knee). Negative for back pain and neck pain.  Skin: Negative for rash.  Neurological: Positive for syncope, facial asymmetry ( chronic) and speech difficulty ( chronic). Negative for seizures, weakness, numbness and headaches.       Disoriented-since resolved     Physical Exam Updated Vital Signs BP (!) 171/106   Pulse (!) 57   Temp 97.8 F (36.6 C) (Axillary)   Resp 18   Ht '5\' 10"'  (1.778 m)   Wt 88.5 kg   LMP  (Within Months)   SpO2 98%   BMI 27.98 kg/m   Physical Exam  Constitutional: She appears well-developed and well-nourished. No distress.  HENT:  Head: Normocephalic and atraumatic.  Mouth/Throat: Oropharynx is clear and moist. No oropharyngeal exudate.  Eyes: Conjunctivae and EOM are normal. Pupils are equal, round, and reactive to light. Right eye exhibits no discharge. Left eye exhibits no discharge. No scleral icterus.  Neck: Normal range of motion and phonation normal. Neck supple. No neck rigidity. Normal range of motion present.  Cardiovascular: Normal rate, regular rhythm, normal heart sounds and intact distal pulses.   No murmur heard. Pulmonary/Chest: Effort normal and breath sounds normal. No stridor. No respiratory distress. She has no wheezes. She has no rales.  Abdominal: Soft. Bowel sounds are normal. She exhibits no distension. There is no tenderness. There is no rigidity, no rebound, no guarding and no CVA tenderness.  Musculoskeletal: Normal range of motion.  Lymphadenopathy:    She has no  cervical adenopathy.  Neurological: She is alert. She has normal strength. She is not disoriented. No sensory deficit. Coordination and gait normal. GCS eye subscore is 4. GCS verbal subscore is 5. GCS motor subscore is 6.  Mental Status:  Alert, thought content appropriate, able to give a coherent history. Mild dysarthria noted. without evidence of aphasia. Able to follow 2 step commands without difficulty.  Cranial Nerves:  II: pupils equal, round, reactive to light III,IV, VI: ptosis not present, extra-ocular motions intact bilaterally  V,VII: smile asymmetric with left sided droop, facial light touch sensation equal VIII: hearing grossly normal to voice  IX, X: uvula elevates symmetrically  XI: bilateral shoulder shrug symmetric and strong XII: midline tongue extension without fasiculations Nml finger-to-nose b/l  Skin: Skin is warm and dry. She is not diaphoretic.  Psychiatric: She has a normal mood and affect. Her behavior is normal.     ED Treatments / Results  Labs (all labs ordered are listed, but only abnormal results are displayed) Labs Reviewed  CBC WITH DIFFERENTIAL/PLATELET - Abnormal; Notable  for the following:       Result Value   WBC 11.1 (*)    RBC 5.35 (*)    Neutro Abs 8.7 (*)    All other components within normal limits  BASIC METABOLIC PANEL - Abnormal; Notable for the following:    Sodium 134 (*)    CO2 20 (*)    Glucose, Bld 190 (*)    Creatinine, Ser 1.40 (*)    GFR calc non Af Amer 44 (*)    GFR calc Af Amer 51 (*)    All other components within normal limits  PROTIME-INR - Abnormal; Notable for the following:    Prothrombin Time 15.9 (*)    All other components within normal limits  URINALYSIS, ROUTINE W REFLEX MICROSCOPIC (NOT AT [facility]) - Abnormal; Notable for the following:    APPearance TURBID (*)    Hgb urine dipstick MODERATE (*)    Bilirubin Urine SMALL (*)    Ketones, ur 15 (*)    Protein, ur 30 (*)    Leukocytes, UA LARGE (*)    All  other components within normal limits  URINE MICROSCOPIC-ADD ON - Abnormal; Notable for the following:    Squamous Epithelial / LPF TOO NUMEROUS TO COUNT (*)    Bacteria, UA MANY (*)    Casts HYALINE CASTS (*)    All other components within normal limits  APTT  URINE RAPID DRUG SCREEN, HOSP PERFORMED  TROPONIN I  FOLATE RBC  RPR  VITAMIN B12  I-STAT TROPOININ, ED    EKG  EKG Interpretation  Date/Time:  Saturday October 28 2015 10:02:51 EDT Ventricular Rate:  54 PR Interval:    QRS Duration: 88 QT Interval:  445 QTC Calculation: 422 R Axis:   30 Text Interpretation:  Sinus rhythm Probable left ventricular hypertrophy ST elev, probable normal early repol pattern No significant change since last tracing Confirmed by KNAPP  MD-J, JON ([id number]) on 10/28/2015 10:19:09 AM       Radiology Ct Head Wo Contrast  Result Date: 10/28/2015 CLINICAL DATA:  Disorientation, syncope.  History of stroke. EXAM: CT HEAD WITHOUT CONTRAST TECHNIQUE: Contiguous axial images were obtained from the base of the skull through the vertex without intravenous contrast. COMPARISON:  CT head and brain MRI dated 10/08/2015. FINDINGS: Brain: Ventricles are stable in size and configuration. Chronic small vessel ischemic changes again noted within the bilateral periventricular and subcortical white matter regions. Small old lacunar infarcts again noted within each basal ganglia region. There is no mass, hemorrhage, edema or other evidence of acute parenchymal abnormality. No extra-axial hemorrhage. Vascular: There are chronic calcified atherosclerotic changes of the large vessels at the skull base. No unexpected hyperdense vessel. Skull: Normal. Negative for fracture or focal lesion. Sinuses/Orbits: No acute finding. Other: None. IMPRESSION: 1. No acute findings.  No intracranial mass, hemorrhage or edema. 2. Chronic ischemic changes as detailed above. Electronically Signed   By: Franki Cabot M.D.   On: 10/28/2015 11:29     Procedures Procedures (including critical care time)  Medications Ordered in ED Medications  aspirin EC tablet 81 mg (not administered)  pravastatin (PRAVACHOL) tablet 40 mg (not administered)  sodium chloride flush (NS) 0.9 % injection 3 mL (not administered)  insulin aspart (novoLOG) injection 0-15 Units (not administered)  insulin aspart (novoLOG) injection 0-5 Units (not administered)  enoxaparin (LOVENOX) injection 40 mg (not administered)  0.9 %  sodium chloride infusion (not administered)  acetaminophen (TYLENOL) tablet 650 mg (not administered)    Or  acetaminophen (TYLENOL) suppository 650 mg (not administered)  zolpidem (AMBIEN) tablet 5 mg (not administered)  senna-docusate (Senokot-S) tablet 1 tablet (not administered)  ondansetron (ZOFRAN) tablet 4 mg (not administered)    Or  ondansetron (ZOFRAN) injection 4 mg (not administered)  alum & mag hydroxide-simeth (MAALOX/MYLANTA) 200-200-20 MG/5ML suspension 30 mL (not administered)  hydrALAZINE (APRESOLINE) injection 5 mg (not administered)  carvedilol (COREG) tablet 25 mg (not administered)     Initial Impression / Assessment and Plan / ED Course  I have reviewed the triage vital signs and the nursing notes.  Pertinent labs & imaging results that were available during my care of the patient were reviewed by me and considered in my medical decision making (see chart for details).  Clinical Course    Patient presents to ED with complaint of disorientation. Patient is afebrile and non-toxic appearing in NAD. Vital signs remarkable for mildly elevated BP. Residual left sided facial asymmetry and dysarthria noted. No other focal neuro deficits. Lungs are clear to auscultation. Heart RRR. ?CVA vs. ?TIA vs. ?residual ssxs from CVA earlier this month. Given h/o CVA will check basic labs and CT head. Consider consult to neurology regarding obs admit vs. MRI in ED and OP f/u. Discussed patient with Dr. Tomi Bamberger   Troponin  negative. No significant change in EKG from previous. Mild elevation of creatinine. Leukocytosis noted - may be inflammatory.  U/A remarkable for UTI, patient asx. CT head negative for any acute abnormalities, chronic ischemic changes noted. Will consult neurology given egarding disorientation, diaphoresis, and syncope.   Spoke with Dr. Shon Hale of Neurology, greatly appreciate his time and input. With no new neurologic deficits, low suspicion for stroke, suspects syncopal episode and to consider further syncope work up. Will consult hospitalist for observation admission for syncope.   Spoke with Dyanne Carrel NP of [facility], greatly appreciated her time and input. Agree to admit for further evaluation of syncope.    Final Clinical Impressions(s) / ED Diagnoses   Final diagnoses:  Syncope, unspecified syncope type    New Prescriptions New Prescriptions   No medications on file     Roxanna Mew, PA-C 10/28/15 1352    Dorie Rank, MD 10/29/15 [phone]

## 2015-10-28 NOTE — H&P (Signed)
History and Physical    Stephanie Lloyd:[id number] DOB: 1970-08-05 DOA: 10/28/2015  PCP: Ann Held, DO Patient coming from: home  Chief Complaint: generalized weakness  HPI: Stephanie Lloyd is a very pleasant 45 y.o. female with medical history significant for previous stroke with residual dysarthria and left-sided weakness, diabetes type 2, hypertension, hyperlipidemia, immune deficiency disorder,thalamic hemorrhage, repeat stroke 2 weeks ago 7 mm nonhemorrhagic infarct within posterior inferior left midbrain and pons on left as is emergency department today with the chief complaint of sudden onset generalized weakness. Initial evaluation concerning for syncope versus seizure.  Information is obtained from the patient and her husband who is at the bedside. She states she was in her usual state of health until she awakened this morning. She was in the bathroom getting ready for the day when she suddenly felt "weak all over". She called out to her husband. Her husband reports she was standing her eyes were she was nonresponsive. She did not follow commands or respond verbally to his questions. She was bearing weight and did not LOSE consciousness. Associated symptoms include skin was cool and clammy. No shortness of breath no complaints of any pain. He states he assisted her in getting dressed she ambulated to the car with the assist of 200 over to the emergency department. During the drive over here she gradually returned to baseline. Patient has little memory of the episode. She denies any fever chills dysuria hematuria frequency or urgency. She denies any abdominal pain nausea vomiting diarrhea constipation melena. She did not eat and drink her normal amount yesterday she was very busy tending to her grandchildren. She reports compliance with her medications but does state she has an unsteady gait. A walker is been recommended but she has yet to acquiesce. She denies any recent falls.  She denies headache dizziness difficulty chewing or swallowing.    ED Course: In the emergency department she is back at her baseline neurologically she's afebrile hemodynamically stable and not hypoxic. She is provided  Review of Systems: As per HPI otherwise 10 point review of systems negative.   Ambulatory Status: She ambulates independently with the not so study date no recent falls fairly independent with ADLs  Past Medical History:  Diagnosis Date  . Diabetes ([place])   . Diabetes mellitus without complication ([place])   . Diastolic dysfunction   . Hypertension   . Immune deficiency disorder ([place])   . Stroke [facility])     No past surgical history on file.  Social History   Social History  . Marital status: Married    Spouse name: N/A  . Number of children: N/A  . Years of education: N/A   Occupational History  . Not on file.   Social History Main Topics  . Smoking status: Never Smoker  . Smokeless tobacco: Never Used  . Alcohol use No     Comment: occ  . Drug use: No  . Sexual activity: Yes    Birth control/ protection: None   Other Topics Concern  . Not on file   Social History Narrative   ** Merged History Encounter **        Allergies  Allergen Reactions  . Metformin And Related Diarrhea    No family history on file. Mother with diabetes and hypertension 3 siblings with hypertension CAD and diabetes  Prior to Admission medications   Medication Sig Start Date End Date Taking? Authorizing Provider  ACCU-CHEK FASTCLIX LANCETS MISC Check blood sugar  twice daily. Dx E:11.9 09/08/15  Yes [name], DO  aspirin EC 81 MG EC tablet Take 1 tablet (81 mg total) by mouth daily. 10/10/15  Yes Modena Jansky, MD  benazepril (LOTENSIN) 40 MG tablet Take 1 tablet (40 mg total) by mouth daily. 10/10/15  Yes Modena Jansky, MD  Blood Glucose Monitoring Suppl (ACCU-CHEK GUIDE) w/Device KIT 1 Device by Does not apply route as directed. Check blood sugar daily  09/08/15  Yes [name], DO  carvedilol (COREG) 25 MG tablet Take 1 tablet (25 mg total) by mouth 2 (two) times daily with a meal. 10/10/15  Yes Modena Jansky, MD  glucose blood (ACCU-CHEK GUIDE) test strip Check blood sugar twice daily. Dx E:11.9 09/08/15  Yes [name], DO  insulin NPH-regular Human (NOVOLIN 70/30 RELION) (70-30) 100 UNIT/ML injection Inject 15 Units into the skin 2 (two) times daily with a meal. 10/10/15  Yes Modena Jansky, MD  pravastatin (PRAVACHOL) 40 MG tablet Take 1 tablet (40 mg total) by mouth daily at 6 PM. 10/10/15  Yes Modena Jansky, MD    Physical Exam: Vitals:   10/28/15 1132 10/28/15 1134 10/28/15 1200 10/28/15 1300  BP: (!) 151/108  151/96 (!) 171/106  Pulse:  (!) 54 (!) 58 (!) 57  Resp:   16 18  Temp:      TempSrc:      SpO2:  100% 100% 98%  Weight:      Height:         General:  Appears calm and comfortable Eyes:  PERRL, EOMI, normal lids, iris ENT:  grossly normal hearing, lips & tongue, His membranes of her mouth are pink slightly dry Neck:  no LAD, masses or thyromegaly Cardiovascular:  RRR, no m/r/g. No LE edema.  Respiratory:  CTA bilaterally, no w/r/r. Normal respiratory effort. Abdomen:  soft, ntnd, is a bowel sounds throughout no guarding or rebounding Skin:  no rash or induration seen on limited exam Musculoskeletal:  grossly normal tone BUE/BLE, good ROM, no bony abnormality Psychiatric:  grossly normal mood and affect, speech fluent and appropriate, AOx3 Neurologic:  CN 2-12 grossly intact, moves all extremities in coordinated fashion, sensation intact speech slow slightly slurred, a she'll symmetry tongue midline  Labs on Admission: I have personally reviewed following labs and imaging studies  CBC:  Recent Labs Lab 10/28/15 0920  WBC 11.1*  NEUTROABS 8.7*  HGB 14.2  HCT 45.6  MCV 85.2  PLT 267   Basic Metabolic Panel:  Recent Labs Lab 10/28/15 0920  NA 134*  K 4.2  CL 103  CO2 20*    GLUCOSE 190*  BUN 14  CREATININE 1.40*  CALCIUM 9.1   GFR: Estimated Creatinine Clearance: 60 mL/min (by C-G formula based on SCr of 1.4 mg/dL (H)). Liver Function Tests: No results for input(s): AST, ALT, ALKPHOS, BILITOT, PROT, ALBUMIN in the last 168 hours. No results for input(s): LIPASE, AMYLASE in the last 168 hours. No results for input(s): AMMONIA in the last 168 hours. Coagulation Profile:  Recent Labs Lab 10/28/15 0920  INR 1.26   Cardiac Enzymes: No results for input(s): CKTOTAL, CKMB, CKMBINDEX, TROPONINI in the last 168 hours. BNP (last 3 results) No results for input(s): PROBNP in the last 8760 hours. HbA1C: No results for input(s): HGBA1C in the last 72 hours. CBG: No results for input(s): GLUCAP in the last 168 hours. Lipid Profile: No results for input(s): CHOL, HDL, LDLCALC, TRIG, CHOLHDL, LDLDIRECT  in the last 72 hours. Thyroid Function Tests: No results for input(s): TSH, T4TOTAL, FREET4, T3FREE, THYROIDAB in the last 72 hours. Anemia Panel: No results for input(s): VITAMINB12, FOLATE, FERRITIN, TIBC, IRON, RETICCTPCT in the last 72 hours. Urine analysis:    Component Value Date/Time   COLORURINE YELLOW 10/28/2015 1112   APPEARANCEUR TURBID (A) 10/28/2015 1112   LABSPEC 1.029 10/28/2015 1112   PHURINE 6.0 10/28/2015 1112   GLUCOSEU NEGATIVE 10/28/2015 1112   HGBUR MODERATE (A) 10/28/2015 1112   BILIRUBINUR SMALL (A) 10/28/2015 1112   BILIRUBINUR neg 02/16/2015 1535   KETONESUR 15 (A) 10/28/2015 1112   PROTEINUR 30 (A) 10/28/2015 1112   UROBILINOGEN 0.2 02/16/2015 1535   UROBILINOGEN 0.2 12/12/2011 1135   NITRITE NEGATIVE 10/28/2015 1112   LEUKOCYTESUR LARGE (A) 10/28/2015 1112    Creatinine Clearance: Estimated Creatinine Clearance: 60 mL/min (by C-G formula based on SCr of 1.4 mg/dL (H)).  Sepsis Labs: _0 (procalcitonin:4,lacticidven:4) )No results found for this or any previous visit (from the past 240 hour(s)).   Radiological  Exams on Admission: Ct Head Wo Contrast  Result Date: 10/28/2015 CLINICAL DATA:  Disorientation, syncope.  History of stroke. EXAM: CT HEAD WITHOUT CONTRAST TECHNIQUE: Contiguous axial images were obtained from the base of the skull through the vertex without intravenous contrast. COMPARISON:  CT head and brain MRI dated 10/08/2015. FINDINGS: Brain: Ventricles are stable in size and configuration. Chronic small vessel ischemic changes again noted within the bilateral periventricular and subcortical white matter regions. Small old lacunar infarcts again noted within each basal ganglia region. There is no mass, hemorrhage, edema or other evidence of acute parenchymal abnormality. No extra-axial hemorrhage. Vascular: There are chronic calcified atherosclerotic changes of the large vessels at the skull base. No unexpected hyperdense vessel. Skull: Normal. Negative for fracture or focal lesion. Sinuses/Orbits: No acute finding. Other: None. IMPRESSION: 1. No acute findings.  No intracranial mass, hemorrhage or edema. 2. Chronic ischemic changes as detailed above. Electronically Signed   By: Franki Cabot M.D.   On: 10/28/2015 11:29    EKG: Independently reviewed. Sinus rhythm Probable left ventricular hypertrophy ST elev, probable normal early repol pattern  Assessment/Plan Principal Problem:   Acute encephalopathy Active Problems:   Hypertension   Diabetes mellitus ([place])   Hyperlipidemia   Syncope   Stroke (cerebrum) ([facility])   Acute kidney injury ([place])   Diastolic dysfunction   #1. Acute encephalopathy. Etiology unclear. Concern for seizure versus syncope. Episode came on suddenly and gradually improved over a 15 minute. Patient with a history of recent stroke. CT of the head no acute findings no intracranial mass hemorrhage or edema. EKG with no acute changes. She is afebrile nontoxic appearing. She has a mild leukocytosis and a questionable urinalysis. Glucose 190. Per ED case discussed with  neurology opined syncope. Back at baseline upon admission -Admit to telemetry -Gentle IV fluids -Obtain orthostatics -Neuro checks -Obtain a B-12 folate RPR -Obtain a chest x-ray -Repeat urinalysis -hOld sedating medications -EEg  2. Hypertension. Poor control in the emergency department. She has not taken her home medications. I medications include benazepril and Coreg. -We'll hold been acid profile for now due to kidney function -Continue Coreg -When necessary hydralazine  #3. Acute kidney injury. Suspect there is a element of chronic kidney disease at play as well. Creatinine 1.4 on admission. She appears slightly dry. -Gentle IV fluids -Hold nephrotoxins -Monitor urine output -Recheck in the morning  #4. Diabetes. Serum glucose 190 on admission. She takes 70/30 at home. Hemoglobin  A1c 8.52 weeks ago. -Carb modified diet -Sliding scale insulin for optimal control  #5. Diastolic dysfunction. Echo done recently with an EF of 17% grade 2 diastolic dysfunction. Compensated -Continue beta blocker -Daily weights -Monitor intake and output  DVT prophylaxis: lovenox  Code Status: full  Family Communication: husband and son at bedside  Disposition Plan: home  Consults called: case discussed dr Shon Hale per ED PA  Admission status: obs    Radene Gunning MD [facility]  If 7PM-7AM, please contact night-coverage [email or web address] Password [email or web address]  10/28/2015, 1:36 PM

## 2015-10-28 NOTE — Evaluation (Signed)
Physical Therapy Evaluation Patient Details Name: Stephanie Lloyd MRN: [id number] DOB: [birth year] Today's Date: 10/28/2015   History of Present Illness  Pt is a 45 y/o female admitted following a bout of unresponsiveness and generalized weakness this morning as she was getting ready for her day in the bathroom. Pt reported -LOC. PMH including but not limited to CVA approximately one year ago, DM, HTN and recent admission (approximately 2 weeks ago) for an acute L brain infarct stroke.  Clinical Impression  Pt presented supine in bed with HOB elevated, awake and willing to participate in therapy session. Prior to admission, pt stated that she was independent with all functional mobility; however, she stated that she felt that she needed an AD when ambulating for improved stability and safety. PT recommending pt d/c home with RW and Outpatient PT services to address balance deficits. Pt would continue to benefit from skilled physical therapy services at this time while admitted and after d/c to address her limitations in order to improve her overall safety and independence with functional mobility.     Follow Up Recommendations Outpatient PT    Equipment Recommendations  Rolling walker with 5" wheels    Recommendations for Other Services       Precautions / Restrictions Precautions Precautions: Fall Restrictions Weight Bearing Restrictions: No      Mobility  Bed Mobility Overal bed mobility: Needs Assistance Bed Mobility: Supine to Sit;Sit to Supine     Supine to sit: Supervision Sit to supine: Supervision   General bed mobility comments: pt required increased time  Transfers Overall transfer level: Needs assistance Equipment used: None Transfers: Sit to/from Stand Sit to Stand: Min guard         General transfer comment: pt required increased time and min guard for safety   Ambulation/Gait Ambulation/Gait assistance: Min guard Ambulation Distance (Feet): 200  Feet Assistive device: Rolling walker (2 wheeled) Gait Pattern/deviations: Step-through pattern Gait velocity: decreased Gait velocity interpretation: Below normal speed for age/gender General Gait Details: pt demonstrated safe technique with RW   Stairs Stairs: Yes Stairs assistance: Min guard Stair Management: One rail Right;Step to pattern;Forwards Number of Stairs: 12 General stair comments: pt leading with L LE to ascend and descend, used a step-to pattern  Wheelchair Mobility    Modified Rankin (Stroke Patients Only)       Balance Overall balance assessment: Needs assistance Sitting-balance support: Feet supported;No upper extremity supported Sitting balance-Leahy Scale: Good     Standing balance support: During functional activity;No upper extremity supported Standing balance-Leahy Scale: Fair                               Pertinent Vitals/Pain Pain Assessment: No/denies pain    Home Living Family/patient expects to be discharged to:: Private residence Living Arrangements: Spouse/significant other;Other relatives Available Help at Discharge: Family;Available PRN/intermittently Type of Home: House Home Access: Level entry     Home Layout: Two level;Bed/bath upstairs Home Equipment: None      Prior Function Level of Independence: Independent               Hand Dominance        Extremity/Trunk Assessment   Upper Extremity Assessment: LUE deficits/detail       LUE Deficits / Details: residual weakness from previous CVA especially with grasp   Lower Extremity Assessment: Overall WFL for tasks assessed      Cervical / Trunk Assessment: Normal  Communication  Communication: Expressive difficulties;Other (comment) (speech slurred)  Cognition Arousal/Alertness: Awake/alert Behavior During Therapy: WFL for tasks assessed/performed Overall Cognitive Status: Within Functional Limits for tasks assessed                       General Comments      Exercises     Assessment/Plan    PT Assessment Patient needs continued PT services  PT Problem List Decreased balance;Decreased coordination;Decreased knowledge of use of DME          PT Treatment Interventions DME instruction;Gait training;Stair training;Therapeutic exercise;Balance training;Neuromuscular re-education;Patient/family education    PT Goals (Current goals can be found in the Care Plan section)  Acute Rehab PT Goals Patient Stated Goal: to go home PT Goal Formulation: With patient Time For Goal Achievement: 11/04/15 Potential to Achieve Goals: Good    Frequency Min 3X/week   Barriers to discharge        Co-evaluation               End of Session Equipment Utilized During Treatment: Gait belt Activity Tolerance: Patient tolerated treatment well Patient left: in bed;with call bell/phone within reach;with bed alarm set Nurse Communication: Mobility status    Functional Assessment Tool Used: clinical judgement Functional Limitation: Mobility: Walking and moving around Mobility: Walking and Moving Around Current Status ([id number](G8978): At least 1 percent but less than 20 percent impaired, limited or restricted Mobility: Walking and Moving Around Goal Status [phone]([id number]): 0 percent impaired, limited or restricted    Time: [id number] PT Time Calculation (min) (ACUTE ONLY): 24 min   Charges:   PT Evaluation $PT Eval Low Complexity: 1 Procedure PT Treatments $Gait Training: 8-22 mins   PT G Codes:   PT G-Codes **NOT FOR INPATIENT CLASS** Functional Assessment Tool Used: clinical judgement Functional Limitation: Mobility: Walking and moving around Mobility: Walking and Moving Around Current Status ([id number](G8978): At least 1 percent but less than 20 percent impaired, limited or restricted Mobility: Walking and Moving Around Goal Status [phone](G8979): 0 percent impaired, limited or restricted    [facility] HospitalJennifer M Paloma Grange 10/28/2015, 6:36 PM Deborah ChalkJennifer Chlora Mcbain, PT,  DPT [phone]

## 2015-10-28 NOTE — ED Notes (Signed)
Pt transported to CT ?

## 2015-10-29 DIAGNOSIS — I69354 Hemiplegia and hemiparesis following cerebral infarction affecting left non-dominant side: Secondary | ICD-10-CM | POA: Diagnosis not present

## 2015-10-29 DIAGNOSIS — E785 Hyperlipidemia, unspecified: Secondary | ICD-10-CM | POA: Diagnosis present

## 2015-10-29 DIAGNOSIS — I69322 Dysarthria following cerebral infarction: Secondary | ICD-10-CM | POA: Diagnosis not present

## 2015-10-29 DIAGNOSIS — E1122 Type 2 diabetes mellitus with diabetic chronic kidney disease: Secondary | ICD-10-CM | POA: Diagnosis present

## 2015-10-29 DIAGNOSIS — I130 Hypertensive heart and chronic kidney disease with heart failure and stage 1 through stage 4 chronic kidney disease, or unspecified chronic kidney disease: Secondary | ICD-10-CM | POA: Diagnosis present

## 2015-10-29 DIAGNOSIS — R55 Syncope and collapse: Secondary | ICD-10-CM | POA: Diagnosis present

## 2015-10-29 DIAGNOSIS — I519 Heart disease, unspecified: Secondary | ICD-10-CM

## 2015-10-29 DIAGNOSIS — I5030 Unspecified diastolic (congestive) heart failure: Secondary | ICD-10-CM | POA: Diagnosis present

## 2015-10-29 DIAGNOSIS — Z888 Allergy status to other drugs, medicaments and biological substances status: Secondary | ICD-10-CM | POA: Diagnosis not present

## 2015-10-29 DIAGNOSIS — N182 Chronic kidney disease, stage 2 (mild): Secondary | ICD-10-CM | POA: Diagnosis present

## 2015-10-29 DIAGNOSIS — Z794 Long term (current) use of insulin: Secondary | ICD-10-CM | POA: Diagnosis not present

## 2015-10-29 DIAGNOSIS — R531 Weakness: Secondary | ICD-10-CM | POA: Diagnosis not present

## 2015-10-29 DIAGNOSIS — N179 Acute kidney failure, unspecified: Secondary | ICD-10-CM | POA: Diagnosis present

## 2015-10-29 DIAGNOSIS — I10 Essential (primary) hypertension: Secondary | ICD-10-CM

## 2015-10-29 DIAGNOSIS — D849 Immunodeficiency, unspecified: Secondary | ICD-10-CM | POA: Diagnosis present

## 2015-10-29 LAB — GLUCOSE, CAPILLARY
GLUCOSE-CAPILLARY: 170 mg/dL — AB (ref 65–99)
GLUCOSE-CAPILLARY: 162 mg/dL — AB (ref 65–99)
GLUCOSE-CAPILLARY: 132 mg/dL — AB (ref 65–99)

## 2015-10-29 LAB — BASIC METABOLIC PANEL
ANION GAP: 6 (ref 5–15)
BUN: 19 mg/dL (ref 6–20)
CALCIUM: 8.8 mg/dL — AB (ref 8.9–10.3)
CHLORIDE: 107 mmol/L (ref 101–111)
CO2: 25 mmol/L (ref 22–32)
Creatinine, Ser: 1.39 mg/dL — ABNORMAL HIGH (ref 0.44–1.00)
GFR calc non Af Amer: 44 mL/min — ABNORMAL LOW (ref >60)
GFR, EST AFRICAN AMERICAN: 51 mL/min — AB (ref >60)
GLUCOSE: 205 mg/dL — AB (ref 65–99)
POTASSIUM: 3.5 mmol/L (ref 3.5–5.1)
Sodium: 138 mmol/L (ref 135–145)

## 2015-10-29 LAB — CBC
HEMATOCRIT: 38.1 % (ref 36.0–46.0)
HEMOGLOBIN: 11.8 g/dL — AB (ref 12.0–15.0)
MCH: 26 pg (ref 26.0–34.0)
MCHC: 31 g/dL (ref 30.0–36.0)
MCV: 83.9 fL (ref 78.0–100.0)
Platelets: 212 10*3/uL (ref 150–400)
RBC: 4.54 MIL/uL (ref 3.87–5.11)
RDW: 13.2 % (ref 11.5–15.5)
WBC: 8.3 10*3/uL (ref 4.0–10.5)

## 2015-10-29 LAB — RPR: RPR Ser Ql: NONREACTIVE

## 2015-10-29 MED ORDER — UNABLE TO FIND: 0 refills | Status: DC

## 2015-10-29 MED ORDER — HYDRALAZINE HCL 10 MG PO TABS: 10.0000 mg | ORAL_TABLET | Freq: Three times a day (TID) | ORAL | 2 refills | Status: DC

## 2015-10-29 MED ORDER — BENAZEPRIL HCL 20 MG PO TABS
20.0000 mg | ORAL_TABLET | Freq: Every day | ORAL | Status: DC
  Administered 2015-10-29: 20 mg via ORAL
  Filled 2015-10-29: qty 1

## 2015-10-29 MED ORDER — HYDRALAZINE HCL 10 MG PO TABS
10.0000 mg | ORAL_TABLET | Freq: Three times a day (TID) | ORAL | Status: DC
  Administered 2015-10-29: 10 mg via ORAL
  Filled 2015-10-29: qty 1

## 2015-10-29 MED ORDER — HYDRALAZINE HCL 20 MG/ML IJ SOLN
10.0000 mg | INTRAMUSCULAR | Status: DC | PRN
  Administered 2015-10-29: 10 mg via INTRAVENOUS
  Filled 2015-10-29: qty 1

## 2015-10-29 MED ORDER — HYDRALAZINE HCL 10 MG PO TABS: 10.0000 mg | ORAL_TABLET | Freq: Two times a day (BID) | ORAL | 1 refills | Status: DC

## 2015-10-29 MED ORDER — BENAZEPRIL HCL 20 MG PO TABS
20.0000 mg | ORAL_TABLET | Freq: Once | ORAL | Status: AC
  Administered 2015-10-29: 20 mg via ORAL
  Filled 2015-10-29: qty 1

## 2015-10-29 MED ORDER — HYDRALAZINE HCL 10 MG PO TABS: 10.0000 mg | ORAL_TABLET | Freq: Two times a day (BID) | ORAL | Status: DC

## 2015-10-29 MED ORDER — BENAZEPRIL HCL 20 MG PO TABS
40.0000 mg | ORAL_TABLET | Freq: Every day | ORAL | Status: DC
  Administered 2015-10-30: 40 mg via ORAL
  Filled 2015-10-29: qty 2

## 2015-10-29 MED ORDER — HYDRALAZINE HCL 25 MG PO TABS
25.0000 mg | ORAL_TABLET | Freq: Three times a day (TID) | ORAL | Status: DC
  Administered 2015-10-29 – 2015-10-30 (×3): 25 mg via ORAL
  Filled 2015-10-29 (×3): qty 1

## 2015-10-29 MED ORDER — BENAZEPRIL HCL 40 MG PO TABS: 40.0000 mg | ORAL_TABLET | Freq: Every day | ORAL | 3 refills | Status: DC

## 2015-10-29 NOTE — Care Management Note (Addendum)
Case Management Note  Patient Details  Name: ARIETTA EISENSTEIN MRN: [id number] Date of Birth: 08-Dec-1970  Subjective/Objective:                  Unresponsive and generalized weakness Action/Plan: Discharge planning Expected Discharge Date:                  Expected Discharge Plan:  Home/Self Care  In-House Referral:     Discharge planning Services  CM Consult  Post Acute Care Choice:  Home Health Choice offered to:  Patient  DME Arranged:  Walker rolling DME Agency:  [place]:  NA [place] Agency:  NA  Status of Service:  Completed, signed off  If discussed at [place] of Stay Meetings, dates discussed:    Additional Comments: CM met with pt in room who confirms she is unemployed and has no insurance. Pt states she has [name], [name] As her PCP and gets her medications at [facility].  CM gave pt Legal Aide of [facility] with number to call and make an appointment for health insurance.  Pt verbalized understanding the importance to follow up and secure medical insurance and [facility] is free service.  Per MD order, CM has faxed information to [facility] with request to call pt to schedule outpt PT.  No other CM needs were communicated. Dellie Catholic, RN 10/29/2015, 11:03 AM

## 2015-10-29 NOTE — Progress Notes (Signed)
[facility]                                                                              Patient Demographics  Stephanie Lloyd, is a 45 y.o. female, DOB - Dec 10, 1970, ZOX:[id number]  Admit date - 10/28/2015   Admitting Physician Courage Mariea Clonts, MD  Outpatient Primary MD for the patient is Donato Schultz, DO  Outpatient specialists:   LOS - 0  days    Chief Complaint  Patient presents with  . Weakness       Brief summary   Stephanie Lloyd is a very pleasant 45 y.o. female with medical history significant for previous stroke with residual dysarthria and left-sided weakness, diabetes type 2, hypertension, hyperlipidemia, immune deficiency disorder,thalamic hemorrhage, repeat stroke 2 weeks ago 7 mm nonhemorrhagic infarct within posterior inferior left midbrain and pons on left as is emergency department today with the chief complaint of sudden onset generalized weakness. She was in her usual state of health until she woke up in the morning, was in the bathroom getting ready for the day when she suddenly felt "weak all over". She called out to her husband. Her husband reports she was standing her eyes were she was nonresponsive. She did not follow commands or respond verbally to his questions. She was bearing weight and did not LOSE consciousness. Associated symptoms include skin was cool and clammy. No shortness of breath no complaints of any pain. He states he assisted her in getting dressed she ambulated to the car with the assist of 200 over to the emergency department. During the drive over here she gradually returned to baseline. Patient has little memory of the episode. She denied any fever chills dysuria hematuria frequency or urgency. She denies any abdominal pain nausea vomiting diarrhea constipation melena. She did not eat and drink her normal amount yesterday she was very busy tending to her grandchildren. She reported compliance with her medications but does  state she has an unsteady gait. A walker is been recommended but she has yet to acquiesce. She denies any recent falls. She denies headache dizziness difficulty chewing or swallowing.    Assessment & Plan   Acute encephalopathy/ syncope.  - Possibly vasovagal episode. Patient had a recent stroke earlier this month in the posterior-inferior left midbrain and pons on the left  - Her symptoms are very consistent with syncopal episode. I also discussed in detail with neurology, Dr. Roxy Manns who did not feel that the patient needed any neurological workup at this time. Patient had no focal weakness, no seizure-like activity. Per neurology, no need for EEG at this time -  CT of the head no acute findings no intracranial mass hemorrhage or edema. EKG with no acute changes. - UA was abnormal however patient had no symptoms of dysuria or fevers, obtain urine culture -  BP is still quite elevated, will follow closely.  - B12 normal  Abnormal UA - Asymptomatic, no fevers, follow urine cultures  Accelerated hypertension - BP still quite elevated - will continue Coreg, benazepril, added hydralazine oral.  - May need secondary workup of hypertension if no significant improvement  Acute  kidney injury on chronic kidney disease stage II - Patient was gently hydrated. Creatinine 1.4 at the time of admission, improved to 1.3    Diabetes. Serum glucose 190 on admission. She takes 70/30 at home. Hemoglobin A1c 8.52 weeks ago. -Carb modified diet -Sliding scale insulin for optimal control   Diastolic dysfunction. Echo done recently with an EF of 60% grade 2 diastolic dysfunction. Compensated -Continue beta blocker -Daily weights, Monitor intake and output  Code Status: full  DVT Prophylaxis:  Lovenox Family Communication: Discussed in detail with the patient, all imaging results, lab results explained to the patient    Disposition Plan: *Hopefully DC home in a.m. if BP is improving  Time Spent in  minutes  25 minutes  Procedures:    Consultants:   Neurology, Dr. Roxy Manns via phone consultation  Antimicrobials:      Medications  Scheduled Meds: . aspirin EC  81 mg Oral Daily  . benazepril  20 mg Oral Once  . [START ON 10/30/2015] benazepril  40 mg Oral Daily  . carvedilol  25 mg Oral BID WC  . enoxaparin (LOVENOX) injection  40 mg Subcutaneous Q24H  . hydrALAZINE  10 mg Oral Q8H  . hydrALAZINE  25 mg Oral Q8H  . insulin aspart  0-15 Units Subcutaneous TID WC  . insulin aspart  0-5 Units Subcutaneous QHS  . pravastatin  40 mg Oral q1800  . sodium chloride flush  3 mL Intravenous Q12H   Continuous Infusions:  PRN Meds:.acetaminophen **OR** acetaminophen, alum & mag hydroxide-simeth, hydrALAZINE, ondansetron **OR** ondansetron (ZOFRAN) IV, senna-docusate, zolpidem   Antibiotics   Anti-infectives    None        Subjective:   Stephanie Lloyd was seen and examined today. Feels back to her baseline Patient denies dizziness, chest pain, shortness of breath, abdominal pain, N/V/D/C, new weakness, numbess, tingling. No acute events overnight.    Objective:   Vitals:   10/29/15 0517 10/29/15 0855 10/29/15 1245 10/29/15 1247  BP: (!) 208/108 (!) 182/92  (!) 207/108  Pulse:  82    Resp:  20 20   Temp:  97.8 F (36.6 C) 97.6 F (36.4 C)   TempSrc:  Oral Oral   SpO2:  96% 97%   Weight:      Height:        Intake/Output Summary (Last 24 hours) at 10/29/15 1259 Last data filed at 10/29/15 1249  Gross per 24 hour  Intake              920 ml  Output              500 ml  Net              420 ml     Wt Readings from Last 3 Encounters:  10/28/15 88.5 kg (195 lb)  09/05/15 88.5 kg (195 lb)  08/18/15 88.1 kg (194 lb 2 oz)     Exam  General: Alert and oriented x 3, NAD  HEENT:  PERRLA, EOMI, Anicteric Sclera  Neck: Supple, no JVD, no masses  Cardiovascular: S1 S2 auscultated, no rubs, murmurs or gallops. Regular rate and rhythm.  Respiratory: Clear to  auscultation bilaterally, no wheezing, rales or rhonchi  Gastrointestinal: Soft, nontender, nondistended, + bowel sounds  Ext: no cyanosis clubbing or edema  Neuro: AAOx3, Cr N's II- XII. Strength 5/5 upper and lower extremities bilaterally  Skin: No rashes  Psych: Normal affect and demeanor, alert and oriented x3    Data Reviewed:  I have personally reviewed following labs and imaging studies  Micro Results No results found for this or any previous visit (from the past 240 hour(s)).  Radiology Reports Ct Angio Head W Or Wo Contrast  Result Date: 10/08/2015 CLINICAL DATA:  Evaluate stroke. EXAM: CT ANGIOGRAPHY HEAD AND NECK TECHNIQUE: Multidetector CT imaging of the head and neck was performed using the standard protocol during bolus administration of intravenous contrast. Multiplanar CT image reconstructions and MIPs were obtained to evaluate the vascular anatomy. Carotid stenosis measurements (when applicable) are obtained utilizing NASCET criteria, using the distal internal carotid diameter as the denominator. CONTRAST:  50 cc Isovue 370 intravenous COMPARISON:  Brain MRI and head CT from earlier today. FINDINGS: CTA NECK Aortic arch: Minimal atheromatous changes.  Three vessel branching. Right carotid system: Noncalcified atheromatous thickening greatest at the bifurcation. No stenosis or signs of dissection. Left carotid system: Mild atheromatous noncalcified wall thickening greatest at the bifurcation. No stenosis or signs of dissection. Vertebral arteries:No proximal subclavian artery stenosis. Left dominant vertebral arteries. Both vertebral arteries are smooth and widely patent to the dura. Skeleton: No acute or aggressive process. Other neck: No incidental mass or adenopathy. Upper chest: Small layering pleural effusions are partly seen. Small cluster of nodules in the right upper lobe, an inflammatory pattern CTA HEAD Anterior circulation: Symmetric carotid arteries and branching.  Mild atherosclerotic calcification on the siphons. Mild to moderate right A1 origin stenosis. Mild atheromatous narrowing of left ACA and bilateral MCA branches. No major vessel occlusion. Negative for aneurysm. Posterior circulation: Left dominant with right vertebral artery ending in PICA. Sizable posterior communicating arteries with small smooth basilar. Bilateral visible AICA with probable stenosis at the right origin, but limited by small vessel size. Focal high-grade left P2 segment stenosis. Negative for aneurysm. No major vessel occlusion. Venous sinuses: Patent Anatomic variants: None significant Delayed phase: No parenchymal enhancement. Severe chronic microvascular disease with multiple chronic infarcts in the brain stem and deep gray nuclei with confluent ischemic gliosis in the cerebral white matter. No evidence of progression or hemorrhage at the known acute left brainstem infarct. Low brain volume for age. Findings likely related to patient's history of hypertension and diabetes. No notable temporal involvement to implicate CADASIL. IMPRESSION: 1. No acute arterial finding. 2. Intracranial atherosclerosis with notable high-grade left P2 segment stenosis. 3. Mild atherosclerosis of the neck without stenosis. 4. Advanced chronic microvascular disease. Known acute brainstem infarct without evidence of progression or hemorrhage. 5. Small layering pleural effusions. Electronically Signed   By: Marnee Spring M.D.   On: 10/08/2015 22:00   Dg Chest 2 View  Result Date: 10/08/2015 CLINICAL DATA:  Numbness to entire right-sided body. EXAM: CHEST  2 VIEW COMPARISON:  None FINDINGS: The heart size and mediastinal contours are within normal limits. Both lungs are clear. The visualized skeletal structures are unremarkable. IMPRESSION: No active cardiopulmonary disease. Electronically Signed   By: Signa Kell M.D.   On: 10/08/2015 21:12   Ct Head Wo Contrast  Result Date: 10/28/2015 CLINICAL DATA:   Disorientation, syncope.  History of stroke. EXAM: CT HEAD WITHOUT CONTRAST TECHNIQUE: Contiguous axial images were obtained from the base of the skull through the vertex without intravenous contrast. COMPARISON:  CT head and brain MRI dated 10/08/2015. FINDINGS: Brain: Ventricles are stable in size and configuration. Chronic small vessel ischemic changes again noted within the bilateral periventricular and subcortical white matter regions. Small old lacunar infarcts again noted within each basal ganglia region. There is no mass, hemorrhage, edema  or other evidence of acute parenchymal abnormality. No extra-axial hemorrhage. Vascular: There are chronic calcified atherosclerotic changes of the large vessels at the skull base. No unexpected hyperdense vessel. Skull: Normal. Negative for fracture or focal lesion. Sinuses/Orbits: No acute finding. Other: None. IMPRESSION: 1. No acute findings.  No intracranial mass, hemorrhage or edema. 2. Chronic ischemic changes as detailed above. Electronically Signed   By: Bary Richard M.D.   On: 10/28/2015 11:29   Ct Head Wo Contrast  Result Date: 10/08/2015 CLINICAL DATA:  Right-sided numbness and generalize weakness EXAM: CT HEAD WITHOUT CONTRAST TECHNIQUE: Contiguous axial images were obtained from the base of the skull through the vertex without intravenous contrast. COMPARISON:  None. FINDINGS: Brain: No evidence of acute infarction, hemorrhage, hydrocephalus, extra-axial collection or mass lesion/mass effect.There is patchy low attenuation within the subcortical and periventricular white matter compatible with chronic microvascular disease. There is several chronic lacunar infarcts identified within the right basal ganglia. Vascular: No hyperdense vessel or unexpected calcification. Skull: Normal. Negative for fracture or focal lesion. Sinuses/Orbits: No acute finding. Other: None. IMPRESSION: 1. No acute intracranial abnormalities. 2. Chronic microvascular disease and  brain atrophy. Electronically Signed   By: Signa Kell M.D.   On: 10/08/2015 14:51   Ct Angio Neck W Or Wo Contrast  Result Date: 10/08/2015 CLINICAL DATA:  Evaluate stroke. EXAM: CT ANGIOGRAPHY HEAD AND NECK TECHNIQUE: Multidetector CT imaging of the head and neck was performed using the standard protocol during bolus administration of intravenous contrast. Multiplanar CT image reconstructions and MIPs were obtained to evaluate the vascular anatomy. Carotid stenosis measurements (when applicable) are obtained utilizing NASCET criteria, using the distal internal carotid diameter as the denominator. CONTRAST:  50 cc Isovue 370 intravenous COMPARISON:  Brain MRI and head CT from earlier today. FINDINGS: CTA NECK Aortic arch: Minimal atheromatous changes.  Three vessel branching. Right carotid system: Noncalcified atheromatous thickening greatest at the bifurcation. No stenosis or signs of dissection. Left carotid system: Mild atheromatous noncalcified wall thickening greatest at the bifurcation. No stenosis or signs of dissection. Vertebral arteries:No proximal subclavian artery stenosis. Left dominant vertebral arteries. Both vertebral arteries are smooth and widely patent to the dura. Skeleton: No acute or aggressive process. Other neck: No incidental mass or adenopathy. Upper chest: Small layering pleural effusions are partly seen. Small cluster of nodules in the right upper lobe, an inflammatory pattern CTA HEAD Anterior circulation: Symmetric carotid arteries and branching. Mild atherosclerotic calcification on the siphons. Mild to moderate right A1 origin stenosis. Mild atheromatous narrowing of left ACA and bilateral MCA branches. No major vessel occlusion. Negative for aneurysm. Posterior circulation: Left dominant with right vertebral artery ending in PICA. Sizable posterior communicating arteries with small smooth basilar. Bilateral visible AICA with probable stenosis at the right origin, but limited  by small vessel size. Focal high-grade left P2 segment stenosis. Negative for aneurysm. No major vessel occlusion. Venous sinuses: Patent Anatomic variants: None significant Delayed phase: No parenchymal enhancement. Severe chronic microvascular disease with multiple chronic infarcts in the brain stem and deep gray nuclei with confluent ischemic gliosis in the cerebral white matter. No evidence of progression or hemorrhage at the known acute left brainstem infarct. Low brain volume for age. Findings likely related to patient's history of hypertension and diabetes. No notable temporal involvement to implicate CADASIL. IMPRESSION: 1. No acute arterial finding. 2. Intracranial atherosclerosis with notable high-grade left P2 segment stenosis. 3. Mild atherosclerosis of the neck without stenosis. 4. Advanced chronic microvascular disease. Known acute brainstem infarct  without evidence of progression or hemorrhage. 5. Small layering pleural effusions. Electronically Signed   By: Marnee SpringJonathon  Watts M.D.   On: 10/08/2015 22:00   Mr Brain Wo Contrast  Result Date: 10/08/2015 CLINICAL DATA:  Right-sided numbness and generalized weakness. EXAM: MRI HEAD WITHOUT CONTRAST TECHNIQUE: Multiplanar, multiecho pulse sequences of the brain and surrounding structures were obtained without intravenous contrast. COMPARISON:  CT head without contrast 10/08/2015 FINDINGS: Brain: The diffusion-weighted images demonstrate acute nonhemorrhagic infarct posteriorly within the inferior midbrain on the left. Multiple remote lacunar infarcts are present throughout the pons and lower midbrain. T2 changes are associated with the acute infarct as well. There multiple foci of susceptibility throughout the brainstem. Extensive punctate susceptibility artifacts are present throughout both cerebral hemispheres as well. Multiple remote lacunar infarcts are present in the basal ganglia bilaterally. Multiple foci of susceptibility are associated. No acute  hemorrhage is present. The ventricles are proportionate to the degree of atrophy. No significant extra-axial fluid collection is present. Vascular: Flow is present in the major intracranial arteries. The right vertebral artery is hypoplastic. Skull and upper cervical spine: The skullbase is within normal limits. Midline sagittal images are otherwise unremarkable. The upper cervical spine is within normal limits. Sinuses/Orbits: Mild mucosal thickening is present in the ethmoid air cells bilaterally. The remaining paranasal sinuses and the mastoid air cells are clear. The globes and orbits are intact. Other: IMPRESSION: 1. Acute 7 mm nonhemorrhagic infarct within the posterior inferior left midbrain and pons on the left. 2. Multiple more remote lacunar infarcts are present throughout the lower midbrain and upper pons bilaterally. 3. Multiple remote lacunar infarcts are present throughout the basal ganglia. 4. Advanced diffuse white matter disease. 5. Multiple foci of susceptibility throughout the brainstem and bilateral cerebral hemispheres suggesting underlying vasculitis. Electronically Signed   By: Marin Robertshristopher  Mattern M.D.   On: 10/08/2015 16:59   Dg Chest Port 1 View  Result Date: 10/28/2015 CLINICAL DATA:  Weakness this morning EXAM: PORTABLE CHEST 1 VIEW COMPARISON:  10/08/2015 FINDINGS: Normal heart size. Lungs clear. No pneumothorax. No pleural effusion. IMPRESSION: No active disease. Electronically Signed   By: Jolaine ClickArthur  Hoss M.D.   On: 10/28/2015 13:52    Lab Data:  CBC:  Recent Labs Lab 10/28/15 0920 10/29/15 0240  WBC 11.1* 8.3  NEUTROABS 8.7*  --   HGB 14.2 11.8*  HCT 45.6 38.1  MCV 85.2 83.9  PLT 250 212   Basic Metabolic Panel:  Recent Labs Lab 10/28/15 0920 10/29/15 0240  NA 134* 138  K 4.2 3.5  CL 103 107  CO2 20* 25  GLUCOSE 190* 205*  BUN 14 19  CREATININE 1.40* 1.39*  CALCIUM 9.1 8.8*   GFR: Estimated Creatinine Clearance: 60.4 mL/min (by C-G formula based on  SCr of 1.39 mg/dL (H)). Liver Function Tests: No results for input(s): AST, ALT, ALKPHOS, BILITOT, PROT, ALBUMIN in the last 168 hours. No results for input(s): LIPASE, AMYLASE in the last 168 hours. No results for input(s): AMMONIA in the last 168 hours. Coagulation Profile:  Recent Labs Lab 10/28/15 0920  INR 1.26   Cardiac Enzymes:  Recent Labs Lab 10/28/15 1404  TROPONINI <0.03   BNP (last 3 results) No results for input(s): PROBNP in the last 8760 hours. HbA1C: No results for input(s): HGBA1C in the last 72 hours. CBG:  Recent Labs Lab 10/28/15 1621 10/28/15 2136 10/29/15 0645 10/29/15 1123  GLUCAP 139* 218* 170* 162*   Lipid Profile: No results for input(s): CHOL, HDL, LDLCALC, TRIG, CHOLHDL,  LDLDIRECT in the last 72 hours. Thyroid Function Tests: No results for input(s): TSH, T4TOTAL, FREET4, T3FREE, THYROIDAB in the last 72 hours. Anemia Panel:  Recent Labs  10/28/15 1631  VITAMINB12 495   Urine analysis:    Component Value Date/Time   COLORURINE YELLOW 10/28/2015 1112   APPEARANCEUR TURBID (A) 10/28/2015 1112   LABSPEC 1.029 10/28/2015 1112   PHURINE 6.0 10/28/2015 1112   GLUCOSEU NEGATIVE 10/28/2015 1112   HGBUR MODERATE (A) 10/28/2015 1112   BILIRUBINUR SMALL (A) 10/28/2015 1112   BILIRUBINUR neg 02/16/2015 1535   KETONESUR 15 (A) 10/28/2015 1112   PROTEINUR 30 (A) 10/28/2015 1112   UROBILINOGEN 0.2 02/16/2015 1535   UROBILINOGEN 0.2 12/12/2011 1135   NITRITE NEGATIVE 10/28/2015 1112   LEUKOCYTESUR LARGE (A) 10/28/2015 1112     RAI,RIPUDEEP M.D. [facility] 10/29/2015, 12:59 PM  Pager: [phone] Between 7am to 7pm - call Pager - [phone]  After 7pm go to [email or web address] - password [email or web address]  Call night coverage person covering after 7pm

## 2015-10-29 NOTE — Discharge Instructions (Signed)
Near-Syncope Near-syncope (commonly known as near fainting) is sudden weakness, dizziness, or feeling like you might pass out. This can happen when getting up or while standing for a long time. It is caused by a sudden decrease in blood flow to the brain, which can occur for various reasons. Most of the reasons are not serious.  HOME CARE Watch your condition for any changes.  Have someone stay with you until you feel stable.  If you feel like you are going to pass out:  Lie down right away.  Prop your feet up if you can.  Breathe deeply and steadily.  Move only when the feeling has gone away. Most of the time, this feeling lasts only a few minutes. You may feel tired for several hours.  Drink enough fluids to keep your pee (urine) clear or pale yellow.  If you are taking blood pressure or heart medicine, stand up slowly.  Follow up with your doctor as told. GET HELP RIGHT AWAY IF:   You have a severe headache.  You have unusual pain in the chest, belly (abdomen), or back.  You have bleeding from the mouth or butt (rectum), or you have black or tarry poop (stool).  You feel your heart beat differently than normal, or you have a very fast pulse.  You pass out, or you twitch and shake when you pass out.  You pass out when sitting or lying down.  You feel confused.  You have trouble walking.  You are weak.  You have vision problems. MAKE SURE YOU:   Understand these instructions.  Will watch your condition.  Will get help right away if you are not doing well or get worse.   This information is not intended to replace advice given to you by your health care provider. Make sure you discuss any questions you have with your health care provider.   Document Released: 07/03/2007 Document Revised: 02/04/2014 Document Reviewed: 06/19/2012 Elsevier Interactive Patient Education 2016 [employer]  

## 2015-10-30 LAB — GLUCOSE, CAPILLARY
Glucose-Capillary: 152 mg/dL — ABNORMAL HIGH (ref 65–99)
Glucose-Capillary: 176 mg/dL — ABNORMAL HIGH (ref 65–99)
Glucose-Capillary: 188 mg/dL — ABNORMAL HIGH (ref 65–99)

## 2015-10-30 LAB — FOLATE RBC
FOLATE, HEMOLYSATE: 408.6 ng/mL
FOLATE, RBC: 1014 ng/mL (ref >498)
HEMATOCRIT: 40.3 % (ref 34.0–46.6)

## 2015-10-30 LAB — URINE CULTURE

## 2015-10-30 MED ORDER — BENAZEPRIL HCL 40 MG PO TABS: 40.0000 mg | ORAL_TABLET | Freq: Every day | ORAL | 3 refills | Status: DC

## 2015-10-30 MED ORDER — HYDRALAZINE HCL 50 MG PO TABS
50.0000 mg | ORAL_TABLET | Freq: Three times a day (TID) | ORAL | Status: DC
  Administered 2015-10-30: 50 mg via ORAL
  Filled 2015-10-30: qty 1

## 2015-10-30 MED ORDER — HYDRALAZINE HCL 50 MG PO TABS
50.0000 mg | ORAL_TABLET | Freq: Three times a day (TID) | ORAL | 3 refills | Status: DC
Start: 2015-10-30 — End: 2016-03-01

## 2015-10-30 NOTE — Progress Notes (Signed)
Physical Therapy Treatment Patient Details Name: Stephanie Lloyd MRN: [id number] DOB: 09/24/1970 Today's Date: 10/30/2015    History of Present Illness Pt is a 45 y/o female admitted following a bout of unresponsiveness and generalized weakness this morning as she was getting ready for her day in the bathroom. Pt reported -LOC. PMH including but not limited to CVA approximately one year ago, DM, HTN and recent admission (approximately 2 weeks ago) for an acute L brain infarct stroke.    PT Comments    Pt progressing well. Pt is safe from a mobility standpoint to d/c home. Pt to receive OPPT at d/c.  Follow Up Recommendations  Outpatient PT     Equipment Recommendations  Rolling walker with 5" wheels    Recommendations for Other Services       Precautions / Restrictions Precautions Precautions: Fall    Mobility  Bed Mobility         Supine to sit: Modified independent (Device/Increase time) Sit to supine: Modified independent (Device/Increase time)      Transfers   Equipment used: None   Sit to Stand: Supervision         General transfer comment: increased time to complete  Ambulation/Gait Ambulation/Gait assistance: Supervision Ambulation Distance (Feet): 750 Feet Assistive device: Rolling walker (2 wheeled) Gait Pattern/deviations: Step-through pattern Gait velocity: decreased Gait velocity interpretation: Below normal speed for age/gender General Gait Details: pt demonstrated safe technique with RW. Slow, steady gait   Stairs            Wheelchair Mobility    Modified Rankin (Stroke Patients Only)       Balance     Sitting balance-Leahy Scale: Good     Standing balance support: During functional activity;No upper extremity supported Standing balance-Leahy Scale: Fair                      Cognition Arousal/Alertness: Awake/alert Behavior During Therapy: WFL for tasks assessed/performed Overall Cognitive Status: Within  Functional Limits for tasks assessed                      Exercises      General Comments        Pertinent Vitals/Pain Pain Assessment: No/denies pain    Home Living                      Prior Function            PT Goals (current goals can now be found in the care plan section) Acute Rehab PT Goals Patient Stated Goal: to go home PT Goal Formulation: With patient Time For Goal Achievement: 11/04/15 Potential to Achieve Goals: Good Progress towards PT goals: Progressing toward goals    Frequency    Min 3X/week      PT Plan Current plan remains appropriate    Co-evaluation             End of Session Equipment Utilized During Treatment: Gait belt Activity Tolerance: Patient tolerated treatment well Patient left: in chair;with call bell/phone within reach;with chair alarm set     Time: [id number] PT Time Calculation (min) (ACUTE ONLY): 12 min  Charges:  $Gait Training: 8-22 mins                    G Codes:      Stephanie Lloyd, Stephanie Lloyd 10/30/2015, 12:26 PM

## 2015-10-30 NOTE — Care Management Note (Signed)
Case Management Note  Patient Details  Name: Stephanie ShirtsMarva N Lloyd MRN: [id number] Date of Birth: 09/08/1970  Subjective/Objective:                    Action/Plan: Pt discharging home with her husband. Pt has walker for home in the room. Pt has had information for outpatient therapy faxed per weekend CM. Bedside RN updated.   Expected Discharge Date:                  Expected Discharge Plan:  Home/Self Care  In-House Referral:     Discharge planning Services  CM Consult  Post Acute Care Choice:    Choice offered to:  Patient  DME Arranged:  Walker rolling DME Agency:  [facility]  HH Arranged:  NA HH Agency:  NA  Status of Service:  Completed, signed off  If discussed at Long Length of Stay Meetings, dates discussed:    Additional Comments:  Kermit BaloKelli F Rayder Sullenger, RN 10/30/2015, 2:33 PM

## 2015-10-30 NOTE — Discharge Summary (Signed)
Physician Discharge Summary   Patient ID: Stephanie Lloyd MRN: [id number] DOB/AGE: 28-Jan-1971 45 y.o.  Admit date: 10/28/2015 Discharge date: 10/30/2015  Primary Care Physician:  Ann Held, DO  Discharge Diagnoses:      Accelerated hypertension . Hyperlipidemia . Hypertension . Stroke (cerebrum) ([place]) . Syncope . Acute kidney injury ([place]) on chronic kidney disease stage II . Diastolic dysfunction   Consults:  Neurology via phone consultation  Recommendations for Outpatient Follow-up:  1. Please repeat CBC/BMET at next visit 2. Please do outpatient secondary work-up for HTN, including renin, aldosterone, MRA abd   DIET: heart healthy     Allergies:   Allergies  Allergen Reactions  . Metformin And Related Diarrhea     DISCHARGE MEDICATIONS: Current Discharge Medication List    START taking these medications   Details  hydrALAZINE (APRESOLINE) 50 MG tablet Take 1 tablet (50 mg total) by mouth 3 (three) times daily. Qty: 90 tablet, Refills: 3    UNABLE TO FIND Outpatient physical therapy   Diagnosis: stroke, physical deconditioning Qty: 1 Mutually Defined, Refills: 0      CONTINUE these medications which have CHANGED   Details  benazepril (LOTENSIN) 40 MG tablet Take 1 tablet (40 mg total) by mouth daily. Qty: 30 tablet, Refills: 3   Associated Diagnoses: Essential hypertension      CONTINUE these medications which have NOT CHANGED   Details  ACCU-CHEK FASTCLIX LANCETS MISC Check blood sugar twice daily. Dx E:11.9 Qty: 102 each, Refills: 12    aspirin EC 81 MG EC tablet Take 1 tablet (81 mg total) by mouth daily. Qty: 30 tablet, Refills: 0    Blood Glucose Monitoring Suppl (ACCU-CHEK GUIDE) w/Device KIT 1 Device by Does not apply route as directed. Check blood sugar daily Qty: 1 kit, Refills: 0    carvedilol (COREG) 25 MG tablet Take 1 tablet (25 mg total) by mouth 2 (two) times daily with a meal. Qty: 60 tablet, Refills: 0   Associated  Diagnoses: Essential hypertension    glucose blood (ACCU-CHEK GUIDE) test strip Check blood sugar twice daily. Dx E:11.9 Qty: 100 each, Refills: 12    insulin NPH-regular Human (NOVOLIN 70/30 RELION) (70-30) 100 UNIT/ML injection Inject 15 Units into the skin 2 (two) times daily with a meal. Qty: 10 mL, Refills: 0   Associated Diagnoses: DM (diabetes mellitus) type II uncontrolled, periph vascular disorder (HCC)    pravastatin (PRAVACHOL) 40 MG tablet Take 1 tablet (40 mg total) by mouth daily at 6 PM. Qty: 30 tablet, Refills: 0         Brief H and P: For complete details please refer to admission H and P, but in brief Cate N Warnsleyis a very pleasant 45 y.o.femalewith medical history significant for previous stroke with residual dysarthria and left-sided weakness, diabetes type 2, hypertension, hyperlipidemia, immune deficiency disorder,thalamic hemorrhage, repeat stroke 2 weeks ago 7 mm nonhemorrhagic infarct within posterior inferior left midbrain and pons on left as is emergency department today with the chief complaint of sudden onset generalized weakness. She was in her usual state of health until she woke up in the morning, was in the bathroom getting ready for the day when she suddenly felt "weak all over". She called out to her husband. Her husband reports she was standing her eyes were she was nonresponsive. She did not follow commands or respond verbally to his questions. She was bearing weight and did not LOSE consciousness. Associated symptoms include skin was cool and clammy.  No shortness of breath no complaints of any pain. He states he assisted her in getting dressed she ambulated to the car. During the drive over here she gradually returned to baseline. Patient has little memory of the episode. She denied any fever chills dysuria hematuria frequency or urgency. She denied any abdominal pain nausea vomiting diarrhea constipation melena. She did not eat and drink her normal  amount a day before the admission she was very busy tending to her grandchildren. She reported compliance with her medications but does state she has an unsteady gait. A walker is been recommended but she has yet to acquiesce. She denies any recent falls. She denies headache dizziness difficulty chewing or swallowing.  Hospital Course:  Acute encephalopathy/ syncope.  - Possibly vasovagal episode. Patient had a recent stroke earlier this month in the posterior-inferior left midbrain and pons on the left  - Her symptoms are very consistent with syncopal episode. I also discussed in detail with neurology, Dr. Shon Hale who did not feel that the patient needed any neurological workup at this time. Patient had no focal weakness, no seizure-like activity. Per neurology, no need for EEG at this time -  CT of the head no acute findings no intracranial mass hemorrhage or edema. EKG with no acute changes. - UA was abnormal however patient had no symptoms of dysuria or fevers, urine culture has been negative so far - B12 normal  Abnormal UA - Asymptomatic, no fevers, urine culture negative so far  Accelerated hypertension - BP was in 200s at the time of admission, now improving - BP now improving, continue continue Coreg, benazepril, added hydralazine oral.  - May need secondary workup of hypertension out patient  Acute kidney injury on chronic kidney disease stage II - Patient was gently hydrated. Creatinine 1.4 at the time of admission, improved to 1.3    Diabetes. Serum glucose 190 on admission. She takes 70/30 at home. Hemoglobin A1c 8.52 weeks ago. -Carb modified diet, continue insulin regimen   Diastolic dysfunction. Echo done recently with an EF of 28% grade 2 diastolic dysfunction. Compensated -Continue beta blocker, ACE inhibitor    Day of Discharge BP (!) 160/83 (BP Location: Left Arm)   Pulse 72   Temp 97.7 F (36.5 C) (Oral)   Resp 20   Ht '5\' 10"'  (1.778 m)   Wt 88.5 kg (195  lb)   LMP  (Within Months)   SpO2 100%   BMI 27.98 kg/m   Physical Exam: General: Alert and awake oriented x3 not in any acute distress. HEENT: anicteric sclera, pupils reactive to light and accommodation CVS: S1-S2 clear no murmur rubs or gallops Chest: clear to auscultation bilaterally, no wheezing rales or rhonchi Abdomen: soft nontender, nondistended, normal bowel sounds Extremities: no cyanosis, clubbing or edema noted bilaterally Neuro: Cranial nerves II-XII intact, no focal neurological deficits   The results of significant diagnostics from this hospitalization (including imaging, microbiology, ancillary and laboratory) are listed below for reference.    LAB RESULTS: Basic Metabolic Panel:  Recent Labs Lab 10/28/15 0920 10/29/15 0240  NA 134* 138  K 4.2 3.5  CL 103 107  CO2 20* 25  GLUCOSE 190* 205*  BUN 14 19  CREATININE 1.40* 1.39*  CALCIUM 9.1 8.8*   Liver Function Tests: No results for input(s): AST, ALT, ALKPHOS, BILITOT, PROT, ALBUMIN in the last 168 hours. No results for input(s): LIPASE, AMYLASE in the last 168 hours. No results for input(s): AMMONIA in the last 168 hours. CBC:  Recent Labs Lab 10/28/15 0920 10/29/15 0240  WBC 11.1* 8.3  NEUTROABS 8.7*  --   HGB 14.2 11.8*  HCT 45.6 38.1  MCV 85.2 83.9  PLT 250 212   Cardiac Enzymes:  Recent Labs Lab 10/28/15 1404  TROPONINI <0.03   BNP: Invalid input(s): POCBNP CBG:  Recent Labs Lab 10/29/15 2121 10/30/15 0626  GLUCAP 132* 176*    Significant Diagnostic Studies:  Ct Head Wo Contrast  Result Date: 10/28/2015 CLINICAL DATA:  Disorientation, syncope.  History of stroke. EXAM: CT HEAD WITHOUT CONTRAST TECHNIQUE: Contiguous axial images were obtained from the base of the skull through the vertex without intravenous contrast. COMPARISON:  CT head and brain MRI dated 10/08/2015. FINDINGS: Brain: Ventricles are stable in size and configuration. Chronic small vessel ischemic changes again  noted within the bilateral periventricular and subcortical white matter regions. Small old lacunar infarcts again noted within each basal ganglia region. There is no mass, hemorrhage, edema or other evidence of acute parenchymal abnormality. No extra-axial hemorrhage. Vascular: There are chronic calcified atherosclerotic changes of the large vessels at the skull base. No unexpected hyperdense vessel. Skull: Normal. Negative for fracture or focal lesion. Sinuses/Orbits: No acute finding. Other: None. IMPRESSION: 1. No acute findings.  No intracranial mass, hemorrhage or edema. 2. Chronic ischemic changes as detailed above. Electronically Signed   By: Franki Cabot M.D.   On: 10/28/2015 11:29   Dg Chest Port 1 View  Result Date: 10/28/2015 CLINICAL DATA:  Weakness this morning EXAM: PORTABLE CHEST 1 VIEW COMPARISON:  10/08/2015 FINDINGS: Normal heart size. Lungs clear. No pneumothorax. No pleural effusion. IMPRESSION: No active disease. Electronically Signed   By: Marybelle Killings M.D.   On: 10/28/2015 13:52      Disposition and Follow-up: Discharge Instructions    Diet - low sodium heart healthy    Complete by:  As directed    Diet Carb Modified    Complete by:  As directed    Increase activity slowly    Complete by:  As directed        DISPOSITION: Home   DISCHARGE FOLLOW-UP Follow-up Information    Xu,Jindong, MD. Schedule an appointment as soon as possible for a visit in 3 week(s).   Specialty:  Neurology Contact information: [address] [address] [place], [place]. Schedule an appointment as soon as possible for a visit in 10 day(s).   Specialty:  Family Medicine Why:  for hospital follow-up and BP check  Contact information: [address] [place] [address] [phone]        [facility] .   Specialty:  Rehabilitation Why:  You will receive a call from  this neuro rehab center to schedule your outpt physical therapy. Contact information: [address] [place] [id number] [place]           Time spent on Discharge: 74mns   Signed:   Haley Roza M.D. [facility] 10/30/2015, 11:02 AM Pager: [phone]

## 2015-10-31 ENCOUNTER — Telehealth: Payer: Self-pay | Admitting: *Deleted

## 2015-10-31 NOTE — Telephone Encounter (Signed)
Unable to reach patient at time of TCM Call. Left message for patient to return call when available.  

## 2015-11-01 NOTE — Telephone Encounter (Signed)
Unable to reach patient at time of TCM Call. Left message for patient to return call when available.  

## 2015-11-17 ENCOUNTER — Telehealth: Payer: Self-pay | Admitting: Family Medicine

## 2015-11-17 DIAGNOSIS — E1165 Type 2 diabetes mellitus with hyperglycemia: Principal | ICD-10-CM

## 2015-11-17 DIAGNOSIS — IMO0002 Reserved for concepts with insufficient information to code with codable children: Secondary | ICD-10-CM

## 2015-11-17 DIAGNOSIS — E1151 Type 2 diabetes mellitus with diabetic peripheral angiopathy without gangrene: Secondary | ICD-10-CM

## 2015-11-17 MED ORDER — INSULIN NPH ISOPHANE & REGULAR (70-30) 100 UNIT/ML ~~LOC~~ SUSP: 15.0000 [IU] | Freq: Two times a day (BID) | SUBCUTANEOUS | 2 refills | Status: DC

## 2015-11-17 NOTE — Telephone Encounter (Signed)
Caller name: Harvie HeckRandy Relationship to patient: Spouse Can be reached: [phone]  [facility]acy:  [facility] [address] - [place], [place] - [phone] (Phone) [phone] (Fax)     Reason for call: refill insulin NPH-regular Human (NOVOLIN 70/30 RELION) (70-30) 100 UNIT/ML injection [[id number]][[id number]]

## 2015-11-20 ENCOUNTER — Telehealth: Payer: Self-pay | Admitting: Family Medicine

## 2015-11-20 NOTE — Telephone Encounter (Signed)
Caller name: Relationship to patient: Self Can be reached: [phone]  Pharmacy:  Reason for call: Patient needs letter of disability [facility] @ [phone].

## 2015-11-20 NOTE — Telephone Encounter (Signed)
I need more than that---  They normally want forms filled out--- we can right a note she is disabled due to her uncontrolled bp and stroke but if more is needed she will need to see a disability doctor.

## 2015-11-20 NOTE — Telephone Encounter (Signed)
Please advise      KP 

## 2015-11-22 NOTE — Telephone Encounter (Signed)
Message left to call the office.    KP 

## 2015-11-23 ENCOUNTER — Encounter: Payer: Self-pay | Admitting: Family Medicine

## 2015-11-23 NOTE — Progress Notes (Unsigned)
MAILED COPY OF COMPLETED DISABILITY FORM TO CLAIM DEPARTMENT. LEFT MESSAGE FOR PATIENT ON HER ANSWERING MACHINE.

## 2015-12-04 ENCOUNTER — Other Ambulatory Visit (HOSPITAL_COMMUNITY): Payer: Self-pay | Admitting: Internal Medicine

## 2015-12-05 ENCOUNTER — Other Ambulatory Visit (HOSPITAL_COMMUNITY): Payer: Self-pay | Admitting: Internal Medicine

## 2015-12-06 ENCOUNTER — Telehealth: Payer: Self-pay | Admitting: Family Medicine

## 2015-12-06 NOTE — Telephone Encounter (Signed)
Caller name: Harvie HeckRandy Relation to pt: spouse Call back number: [phone] Pharmacy:[facility] [place] - [address]  Reason for call: Pt is Requesting needing insulin NPH-regular Human (NOVOLIN 70/30 RELION) (70-30) 100 UNIT/ML injection, pt spouse states that pt has been hospitalized twice, pt has not set up a fu appt since pt does not have health insurance. [facility]. Please advise.

## 2015-12-07 ENCOUNTER — Other Ambulatory Visit: Payer: Self-pay

## 2015-12-07 DIAGNOSIS — E1165 Type 2 diabetes mellitus with hyperglycemia: Principal | ICD-10-CM

## 2015-12-07 DIAGNOSIS — IMO0002 Reserved for concepts with insufficient information to code with codable children: Secondary | ICD-10-CM

## 2015-12-07 DIAGNOSIS — E1151 Type 2 diabetes mellitus with diabetic peripheral angiopathy without gangrene: Secondary | ICD-10-CM

## 2015-12-07 MED ORDER — INSULIN NPH ISOPHANE & REGULAR (70-30) 100 UNIT/ML ~~LOC~~ SUSP: 15.0000 [IU] | Freq: Two times a day (BID) | SUBCUTANEOUS | 2 refills | Status: DC

## 2015-12-07 NOTE — Progress Notes (Signed)
Sent in Rx for patient. LB

## 2015-12-15 ENCOUNTER — Telehealth: Payer: Self-pay

## 2015-12-15 MED ORDER — PRAVASTATIN SODIUM 40 MG PO TABS: 40.0000 mg | ORAL_TABLET | Freq: Every day | ORAL | 0 refills | Status: DC

## 2015-12-15 NOTE — Telephone Encounter (Signed)
Last filled: 10/10/15 Amt: 30, 0 Last OV: 09/05/15 Rx sent to pharmacy.  Called patient and left a message for call back.  When patient calls back, please schedule follow up appt with provider.

## 2016-01-10 ENCOUNTER — Telehealth: Payer: Self-pay | Admitting: Family Medicine

## 2016-01-10 NOTE — Telephone Encounter (Signed)
Form received. Paperwork process initiated.   

## 2016-01-10 NOTE — Telephone Encounter (Signed)
Spouse dropped off claim form for continuing disability insurance benefits, form placed in paperwork bin at front desk.

## 2016-01-17 NOTE — Telephone Encounter (Signed)
Received completed and signed Continuing Disability Insurance Benefits form from Dr. Laury AxonLowne.  Called and spoke with the pt and informed her that the form has been completed.  Asked the pt if she would like for me to mail the form or to fax the form.  Pt stated to fax the form.  Asked if she would like a copy of the form.  Pt stated that she would like a copy of the form.  Informed the pt that I will fax the form, then I will mail her the original form.  Pt agreed.  Form faxed and confirmation received.  Mailed the original to the pt, and a copy of the form was sent to be scanned.//AB/CMA

## 2016-02-05 ENCOUNTER — Telehealth: Payer: Self-pay | Admitting: Family Medicine

## 2016-02-05 NOTE — Telephone Encounter (Signed)
Pt dropped off document to be filled out Nurse, adult(Physicians Certification) 4 pages front and back, please fax document to [phone]. Document put at front office tray.

## 2016-02-08 NOTE — Telephone Encounter (Signed)
Form forwarded to provider for completion.

## 2016-02-08 NOTE — Telephone Encounter (Signed)
Paperwork received and is currently in process.

## 2016-02-08 NOTE — Telephone Encounter (Signed)
Patient inquiring about the status of form mentioned below.

## 2016-02-19 NOTE — Telephone Encounter (Signed)
Provider would like for patient to come in for OV in order to complete paperwork.  Please call patient and schedule an appt.

## 2016-02-19 NOTE — Telephone Encounter (Signed)
Attempted to reach out to patient [phone] is disconnected.

## 2016-02-21 NOTE — Telephone Encounter (Signed)
Pt called back for status of paperwork, phone number was entered incorrectly and has been updated [phone], pt states she does not have any insurance or any income at all to come for an appointment. Pt really need the paperwork really bad, but she understands if Dr. Laury AxonLowne can not fill them out unless you has an office visit.

## 2016-02-22 NOTE — Telephone Encounter (Signed)
Appt scheduled

## 2016-02-23 ENCOUNTER — Ambulatory Visit: Payer: Self-pay | Admitting: Family Medicine

## 2016-02-28 ENCOUNTER — Encounter (HOSPITAL_COMMUNITY): Payer: Self-pay | Admitting: General Practice

## 2016-02-28 ENCOUNTER — Inpatient Hospital Stay (HOSPITAL_COMMUNITY)
Admission: EM | Admit: 2016-02-28 | Discharge: 2016-03-01 | DRG: 065 | Disposition: A | Discharge status: Home / Self Care | Payer: Medicaid Other | Attending: Family Medicine | Admitting: Family Medicine

## 2016-02-28 ENCOUNTER — Emergency Department (HOSPITAL_COMMUNITY): Payer: Medicaid Other

## 2016-02-28 ENCOUNTER — Telehealth: Payer: Self-pay | Admitting: Family Medicine

## 2016-02-28 DIAGNOSIS — Z7982 Long term (current) use of aspirin: Secondary | ICD-10-CM

## 2016-02-28 DIAGNOSIS — I160 Hypertensive urgency: Secondary | ICD-10-CM | POA: Diagnosis present

## 2016-02-28 DIAGNOSIS — E785 Hyperlipidemia, unspecified: Secondary | ICD-10-CM | POA: Diagnosis present

## 2016-02-28 DIAGNOSIS — E1151 Type 2 diabetes mellitus with diabetic peripheral angiopathy without gangrene: Secondary | ICD-10-CM | POA: Diagnosis present

## 2016-02-28 DIAGNOSIS — E1159 Type 2 diabetes mellitus with other circulatory complications: Secondary | ICD-10-CM

## 2016-02-28 DIAGNOSIS — Z8249 Family history of ischemic heart disease and other diseases of the circulatory system: Secondary | ICD-10-CM | POA: Diagnosis not present

## 2016-02-28 DIAGNOSIS — E1122 Type 2 diabetes mellitus with diabetic chronic kidney disease: Secondary | ICD-10-CM | POA: Diagnosis present

## 2016-02-28 DIAGNOSIS — Z79899 Other long term (current) drug therapy: Secondary | ICD-10-CM | POA: Diagnosis not present

## 2016-02-28 DIAGNOSIS — Z833 Family history of diabetes mellitus: Secondary | ICD-10-CM | POA: Diagnosis not present

## 2016-02-28 DIAGNOSIS — I6302 Cerebral infarction due to thrombosis of basilar artery: Secondary | ICD-10-CM | POA: Diagnosis not present

## 2016-02-28 DIAGNOSIS — Z823 Family history of stroke: Secondary | ICD-10-CM

## 2016-02-28 DIAGNOSIS — Z8673 Personal history of transient ischemic attack (TIA), and cerebral infarction without residual deficits: Secondary | ICD-10-CM | POA: Diagnosis not present

## 2016-02-28 DIAGNOSIS — I10 Essential (primary) hypertension: Secondary | ICD-10-CM | POA: Diagnosis not present

## 2016-02-28 DIAGNOSIS — I639 Cerebral infarction, unspecified: Principal | ICD-10-CM | POA: Diagnosis present

## 2016-02-28 DIAGNOSIS — I69351 Hemiplegia and hemiparesis following cerebral infarction affecting right dominant side: Secondary | ICD-10-CM

## 2016-02-28 DIAGNOSIS — I129 Hypertensive chronic kidney disease with stage 1 through stage 4 chronic kidney disease, or unspecified chronic kidney disease: Secondary | ICD-10-CM | POA: Diagnosis present

## 2016-02-28 DIAGNOSIS — Z66 Do not resuscitate: Secondary | ICD-10-CM | POA: Diagnosis present

## 2016-02-28 DIAGNOSIS — D849 Immunodeficiency, unspecified: Secondary | ICD-10-CM | POA: Diagnosis present

## 2016-02-28 DIAGNOSIS — Z9119 Patient's noncompliance with other medical treatment and regimen: Secondary | ICD-10-CM | POA: Diagnosis not present

## 2016-02-28 DIAGNOSIS — R29810 Facial weakness: Secondary | ICD-10-CM | POA: Diagnosis present

## 2016-02-28 DIAGNOSIS — N182 Chronic kidney disease, stage 2 (mild): Secondary | ICD-10-CM | POA: Diagnosis present

## 2016-02-28 DIAGNOSIS — Z794 Long term (current) use of insulin: Secondary | ICD-10-CM | POA: Diagnosis not present

## 2016-02-28 DIAGNOSIS — Z8679 Personal history of other diseases of the circulatory system: Secondary | ICD-10-CM

## 2016-02-28 DIAGNOSIS — Z888 Allergy status to other drugs, medicaments and biological substances status: Secondary | ICD-10-CM

## 2016-02-28 DIAGNOSIS — E119 Type 2 diabetes mellitus without complications: Secondary | ICD-10-CM

## 2016-02-28 DIAGNOSIS — R471 Dysarthria and anarthria: Secondary | ICD-10-CM | POA: Diagnosis present

## 2016-02-28 LAB — CBC WITH DIFFERENTIAL/PLATELET
Basophils Absolute: 0 10*3/uL (ref 0.0–0.1)
Basophils Relative: 0 %
Eosinophils Absolute: 0.2 10*3/uL (ref 0.0–0.7)
Eosinophils Relative: 2 %
HEMATOCRIT: 40.3 % (ref 36.0–46.0)
Hemoglobin: 13 g/dL (ref 12.0–15.0)
LYMPHS PCT: 12 %
Lymphs Abs: 1.3 10*3/uL (ref 0.7–4.0)
MCH: 26.8 pg (ref 26.0–34.0)
MCHC: 32.3 g/dL (ref 30.0–36.0)
MCV: 83.1 fL (ref 78.0–100.0)
MONO ABS: 0.5 10*3/uL (ref 0.1–1.0)
MONOS PCT: 4 %
NEUTROS ABS: 8.9 10*3/uL — AB (ref 1.7–7.7)
Neutrophils Relative %: 82 %
Platelets: 190 10*3/uL (ref 150–400)
RBC: 4.85 MIL/uL (ref 3.87–5.11)
RDW: 13.9 % (ref 11.5–15.5)
WBC: 10.9 10*3/uL — ABNORMAL HIGH (ref 4.0–10.5)

## 2016-02-28 LAB — BASIC METABOLIC PANEL
Anion gap: 7 (ref 5–15)
BUN: 17 mg/dL (ref 6–20)
CO2: 24 mmol/L (ref 22–32)
Calcium: 9.2 mg/dL (ref 8.9–10.3)
Chloride: 105 mmol/L (ref 101–111)
Creatinine, Ser: 1.46 mg/dL — ABNORMAL HIGH (ref 0.44–1.00)
GFR calc Af Amer: 48 mL/min — ABNORMAL LOW (ref >60)
GFR calc non Af Amer: 42 mL/min — ABNORMAL LOW (ref >60)
Glucose, Bld: 214 mg/dL — ABNORMAL HIGH (ref 65–99)
Potassium: 3.7 mmol/L (ref 3.5–5.1)
Sodium: 136 mmol/L (ref 135–145)

## 2016-02-28 LAB — URINALYSIS, ROUTINE W REFLEX MICROSCOPIC
Bilirubin Urine: NEGATIVE
Glucose, UA: 50 mg/dL — AB
Ketones, ur: NEGATIVE mg/dL
Nitrite: NEGATIVE
PH: 6 (ref 5.0–8.0)
Protein, ur: NEGATIVE mg/dL
SPECIFIC GRAVITY, URINE: 1.005 (ref 1.005–1.030)

## 2016-02-28 LAB — I-STAT BETA HCG BLOOD, ED (MC, WL, AP ONLY): I-stat hCG, quantitative: <5 m[IU]/mL (ref <5)

## 2016-02-28 LAB — GLUCOSE, CAPILLARY: Glucose-Capillary: 189 mg/dL — ABNORMAL HIGH (ref 65–99)

## 2016-02-28 MED ORDER — ATORVASTATIN CALCIUM 80 MG PO TABS
80.0000 mg | ORAL_TABLET | Freq: Every day | ORAL | Status: DC
  Administered 2016-02-28 – 2016-02-29 (×2): 80 mg via ORAL
  Filled 2016-02-28 (×2): qty 1

## 2016-02-28 MED ORDER — ASPIRIN 300 MG RE SUPP: 300.0000 mg | Freq: Once | RECTAL | Status: AC

## 2016-02-28 MED ORDER — HYDRALAZINE HCL 20 MG/ML IJ SOLN
10.0000 mg | Freq: Once | INTRAMUSCULAR | Status: AC
Start: 2016-02-28 — End: 2016-02-28
  Administered 2016-02-28: 10 mg via INTRAVENOUS
  Filled 2016-02-28: qty 1

## 2016-02-28 MED ORDER — INSULIN ASPART 100 UNIT/ML ~~LOC~~ SOLN: 0.0000 [IU] | Freq: Every day | SUBCUTANEOUS | Status: DC

## 2016-02-28 MED ORDER — ACETAMINOPHEN 160 MG/5ML PO SOLN: 650.0000 mg | ORAL | Status: DC | PRN

## 2016-02-28 MED ORDER — SENNOSIDES-DOCUSATE SODIUM 8.6-50 MG PO TABS: 1.0000 | ORAL_TABLET | Freq: Every evening | ORAL | Status: DC | PRN

## 2016-02-28 MED ORDER — STROKE: EARLY STAGES OF RECOVERY BOOK
Freq: Once | Status: AC
  Administered 2016-02-28: 1
  Filled 2016-02-28: qty 1

## 2016-02-28 MED ORDER — INSULIN ASPART 100 UNIT/ML ~~LOC~~ SOLN
0.0000 [IU] | Freq: Three times a day (TID) | SUBCUTANEOUS | Status: DC
  Administered 2016-02-29 (×3): 2 [IU] via SUBCUTANEOUS
  Administered 2016-03-01 (×2): 1 [IU] via SUBCUTANEOUS

## 2016-02-28 MED ORDER — SODIUM CHLORIDE 0.9 % IV SOLN: 250.0000 mL | INTRAVENOUS | Status: DC | PRN

## 2016-02-28 MED ORDER — SODIUM CHLORIDE 0.9% FLUSH
3.0000 mL | Freq: Two times a day (BID) | INTRAVENOUS | Status: DC
  Administered 2016-02-28 – 2016-03-01 (×4): 3 mL via INTRAVENOUS

## 2016-02-28 MED ORDER — ENOXAPARIN SODIUM 40 MG/0.4ML ~~LOC~~ SOLN
40.0000 mg | SUBCUTANEOUS | Status: DC
  Administered 2016-02-28 – 2016-02-29 (×2): 40 mg via SUBCUTANEOUS
  Filled 2016-02-28 (×2): qty 0.4

## 2016-02-28 MED ORDER — HYDRALAZINE HCL 20 MG/ML IJ SOLN: 5.0000 mg | INTRAMUSCULAR | Status: DC | PRN

## 2016-02-28 MED ORDER — SODIUM CHLORIDE 0.9 % IV BOLUS (SEPSIS)
1000.0000 mL | Freq: Once | INTRAVENOUS | Status: AC
  Administered 2016-02-28: 1000 mL via INTRAVENOUS

## 2016-02-28 MED ORDER — LABETALOL HCL 5 MG/ML IV SOLN
10.0000 mg | INTRAVENOUS | Status: DC | PRN
  Administered 2016-02-28: 10 mg via INTRAVENOUS
  Filled 2016-02-28: qty 4

## 2016-02-28 MED ORDER — ACETAMINOPHEN 650 MG RE SUPP: 650.0000 mg | RECTAL | Status: DC | PRN

## 2016-02-28 MED ORDER — ACETAMINOPHEN 325 MG PO TABS
650.0000 mg | ORAL_TABLET | ORAL | Status: DC | PRN
  Administered 2016-02-28: 650 mg via ORAL
  Filled 2016-02-28: qty 2

## 2016-02-28 MED ORDER — SODIUM CHLORIDE 0.9% FLUSH: 3.0000 mL | INTRAVENOUS | Status: DC | PRN

## 2016-02-28 MED ORDER — ASPIRIN 325 MG PO TABS
325.0000 mg | ORAL_TABLET | Freq: Once | ORAL | Status: AC
Start: 2016-02-28 — End: 2016-02-28
  Administered 2016-02-28: 325 mg via ORAL
  Filled 2016-02-28: qty 1

## 2016-02-28 NOTE — Telephone Encounter (Signed)
Message sent to [facility].

## 2016-02-28 NOTE — ED Triage Notes (Signed)
Pt arrives via EMS from home with generalized weakness that began yesterday. Per husband this AM pt unable to get out of bed this morning. For EMs initially nonverbal, stroke scale neg. Pt currently awake ,alert, oriented x4, VSS. Pt with hx of 2 prior strokes, on the left and right. CBG 222.

## 2016-02-28 NOTE — Progress Notes (Signed)
Received a page from patient's nurse. Per RN, patient wasn't thinking right when she made a self DNR on admission. She requests changing to full code status. DNR order discontinued.

## 2016-02-28 NOTE — ED Notes (Signed)
Placed patient on bedpan to attempt to provide an urine specimen; visitor stepped out for privacy; patient instructed to call out when finished

## 2016-02-28 NOTE — ED Notes (Signed)
Patient has returned from being out of the department; patient placed back on monitor, continuous pulse oximetry and blood pressure cuff; admitting team is in speaking with patient at this time

## 2016-02-28 NOTE — Consult Note (Signed)
Requesting Physician: ED M.D.    Chief Complaint: Stroke  History obtained from:  Patient     HPI:                                                                                                                                         Stephanie Lloyd is an 46 y.o. female with a history of hemorrhagic CVA, diabetes and hypertension who presented to the ED with slurred speech and generalized weakness. She states that she believes that it started around 9 AM yesterday but isn't really clear. She did not seek any medical attention at that time. She denies any specific localizing or lateralizing weakness but states that she is generally weak. She's not been taking her aspirin as she has not been financially able to take her aspirin. Due to the weakness she was brought to the emergency department.  Initial CT showed advanced diffuse atrophy but no acute intracranial infarct.  Follow-up MRI did show an acute infarct affecting the left. Medium pons. And a small focus of acute infarct in the right insular cortex. Also has extensive old ischemic changes throughout the brain stem, thalami, cerebral hemispheres and white matter. Currently patient's clinical symptoms is predominantly dysarthria.  Date last known well: Date: 02/26/2016 Time last known well: Unable to determine tPA Given: No: Out of the window   Past Medical History:  Diagnosis Date  . Diabetes ([place])   . Diabetes mellitus without complication ([place])   . Diastolic dysfunction   . Hypertension   . Immune deficiency disorder ([place])   . Stroke [facility])     No past surgical history on file.  No family history on file. Social History:  reports that she has never smoked. She has never used smokeless tobacco. She reports that she does not drink alcohol or use drugs.  Allergies:  Allergies  Allergen Reactions  . Metformin And Related Diarrhea    Medications:                                                                                                                            Current Facility-Administered Medications  Medication Dose Route Frequency Provider Last Rate Last Dose  . sodium chloride 0.9 % bolus 1,000 mL  1,000 mL Intravenous Once Lorayne Bender, PA-C       Current Outpatient Prescriptions  Medication  Sig Dispense Refill  . ACCU-CHEK FASTCLIX LANCETS MISC Check blood sugar twice daily. Dx E:11.9 102 each 12  . aspirin EC 81 MG EC tablet Take 1 tablet (81 mg total) by mouth daily. 30 tablet 0  . benazepril (LOTENSIN) 40 MG tablet Take 1 tablet (40 mg total) by mouth daily. 30 tablet 3  . Blood Glucose Monitoring Suppl (ACCU-CHEK GUIDE) w/Device KIT 1 Device by Does not apply route as directed. Check blood sugar daily 1 kit 0  . carvedilol (COREG) 25 MG tablet Take 1 tablet (25 mg total) by mouth 2 (two) times daily with a meal. 60 tablet 0  . glucose blood (ACCU-CHEK GUIDE) test strip Check blood sugar twice daily. Dx E:11.9 100 each 12  . hydrALAZINE (APRESOLINE) 50 MG tablet Take 1 tablet (50 mg total) by mouth 3 (three) times daily. 90 tablet 3  . insulin NPH-regular Human (NOVOLIN 70/30 RELION) (70-30) 100 UNIT/ML injection Inject 15 Units into the skin 2 (two) times daily with a meal. 10 mL 2  . pravastatin (PRAVACHOL) 40 MG tablet Take 1 tablet (40 mg total) by mouth daily at 6 PM. Please call office ASAP to schedule an Office Visit. 30 tablet 0  . UNABLE TO FIND Outpatient physical therapy   Diagnosis: stroke, physical deconditioning 1 Mutually Defined 0     ROS:                                                                                                                                       History obtained from the patient  General ROS: negative for - chills, fatigue, fever, night sweats, weight gain or weight loss Psychological ROS: negative for - behavioral disorder, hallucinations, memory difficulties, mood swings or suicidal ideation Ophthalmic ROS: negative for - blurry vision, double vision, eye  pain or loss of vision ENT ROS: negative for - epistaxis, nasal discharge, oral lesions, sore throat, tinnitus or vertigo Allergy and Immunology ROS: negative for - hives or itchy/watery eyes Hematological and Lymphatic ROS: negative for - bleeding problems, bruising or swollen lymph nodes Endocrine ROS: negative for - galactorrhea, hair pattern changes, polydipsia/polyuria or temperature intolerance Respiratory ROS: negative for - cough, hemoptysis, shortness of breath or wheezing Cardiovascular ROS: negative for - chest pain, dyspnea on exertion, edema or irregular heartbeat Gastrointestinal ROS: negative for - abdominal pain, diarrhea, hematemesis, nausea/vomiting or stool incontinence Genito-Urinary ROS: negative for - dysuria, hematuria, incontinence or urinary frequency/urgency Musculoskeletal ROS: negative for - joint swelling or muscular weakness Neurological ROS: as noted in HPI Dermatological ROS: negative for rash and skin lesion changes  Neurologic Examination:  Blood pressure (!) 195/119, pulse 87, temperature 97.6 F (36.4 C), temperature source Oral, resp. rate 18, weight 88.9 kg (196 lb), last menstrual period 02/07/2016, SpO2 99 %.  HEENT-  Normocephalic, no lesions, without obvious abnormality.  Normal external eye and conjunctiva.  Normal TM's bilaterally.  Normal auditory canals and external ears. Normal external nose, mucus membranes and septum.  Normal pharynx. Cardiovascular- S1, S2 normal, pulses palpable throughout   Lungs- chest clear, no wheezing, rales, normal symmetric air entry, Heart exam - S1, S2 normal, no murmur, no gallop, rate regular Abdomen- soft, non-tender; bowel sounds normal; no masses,  no organomegaly Extremities- no edema Lymph-no adenopathy palpable Musculoskeletal-no joint tenderness, deformity or swelling Skin-warm and dry, no hyperpigmentation,  vitiligo, or suspicious lesions  Neurological Examination Mental Status: Alert, oriented, thought content appropriate.  Speech dysarthric without evidence of aphasia.  Able to follow 3 step commands without difficulty. Cranial Nerves: II:  Visual fields grossly normal,  III,IV, VI: ptosis not present, extra-ocular motions intact bilaterally, pupils equal, round, reactive to light and accommodation V,VII: smile symmetric, facial light touch sensation normal bilaterally VIII: hearing normal bilaterally IX,X: uvula rises symmetrically XI: bilateral shoulder shrug XII: midline tongue extension Motor: Right : Upper extremity   5/5    Left:     Upper extremity   5/5  Lower extremity   5/5     Lower extremity   5/5 Tone and bulk:normal tone throughout; no atrophy noted Sensory: Pinprick and light touch intact throughout, bilaterally Deep Tendon Reflexes: 2+ and symmetric throughout Plantars: Right: downgoing   Left: downgoing Cerebellar: normal finger-to-nose,  and normal heel-to-shin test Gait: Not tested       Lab Results: Basic Metabolic Panel:  Recent Labs Lab 02/28/16 1218  NA 136  K 3.7  CL 105  CO2 24  GLUCOSE 214*  BUN 17  CREATININE 1.46*  CALCIUM 9.2    Liver Function Tests: No results for input(s): AST, ALT, ALKPHOS, BILITOT, PROT, ALBUMIN in the last 168 hours. No results for input(s): LIPASE, AMYLASE in the last 168 hours. No results for input(s): AMMONIA in the last 168 hours.  CBC:  Recent Labs Lab 02/28/16 1218  WBC 10.9*  NEUTROABS 8.9*  HGB 13.0  HCT 40.3  MCV 83.1  PLT 190    Cardiac Enzymes: No results for input(s): CKTOTAL, CKMB, CKMBINDEX, TROPONINI in the last 168 hours.  Lipid Panel: No results for input(s): CHOL, TRIG, HDL, CHOLHDL, VLDL, LDLCALC in the last 168 hours.  CBG: No results for input(s): GLUCAP in the last 168 hours.  Microbiology: Results for orders placed or performed during the hospital encounter of 10/28/15   Urine culture     Status: Abnormal   Collection Time: 10/29/15  1:30 PM  Result Value Ref Range Status   Specimen Description URINE, RANDOM  Final   Special Requests NONE  Final   Culture MULTIPLE SPECIES PRESENT, SUGGEST RECOLLECTION (A)  Final   Report Status 10/30/2015 FINAL  Final    Coagulation Studies: No results for input(s): LABPROT, INR in the last 72 hours.  Imaging: Ct Head Wo Contrast  Result Date: 02/28/2016 CLINICAL DATA:  Possible stroke, symptoms began yesterday morning with slurred speech and weakness EXAM: CT HEAD WITHOUT CONTRAST TECHNIQUE: Contiguous axial images were obtained from the base of the skull through the vertex without intravenous contrast. COMPARISON:  CT brain scan of 10/28/2015 FINDINGS: Brain: The ventricular system remains prominent, as are the cortical sulci, consistent with atrophy advanced for age.  The septum is midline in position. Somewhat prominent cerebellar folia also are noted. The fourth ventricle and basilar cisterns are unremarkable. No hemorrhage, mass effect or in acute infarction is noted. There is significant small vessel ischemic change throughout the periventricular white matter for age which may have advanced slightly in the interval as well. Vascular: No vascular abnormality is seen on this unenhanced study. Skull: On bone window images, no calvarial abnormality is seen. Sinuses/Orbits: There is no evidence of sinusitis. A probable retention cyst is noted anteriorly in the left maxillary sinus. Other: 1. Advanced diffuse atrophy for age with some progression of small vessel ischemic change. No acute intracranial abnormality. 2. 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 Electronically Signed   By: Ivar Drape M.D.   On: 02/28/2016 12:16   Mr Brain Wo Contrast  Result Date: 02/28/2016 CLINICAL DATA:  Generalize weakness beginning yesterday. Unable to get out of bit head. EXAM: MRI HEAD WITHOUT CONTRAST TECHNIQUE: Multiplanar,  multiecho pulse sequences of the brain and surrounding structures were obtained without intravenous contrast. COMPARISON:  Head CT same day.  MRI 10/08/2015 FINDINGS: Brain: Diffusion imaging shows acute infarction within the left side of the pons. There is a punctate acute infarction in the deep insular cortex on the right. Chronic small-vessel ischemic changes affect the pons bilaterally. No focal cerebellar insult. Cerebral hemispheres show old hemorrhagic infarction in both thalami and throughout the deep and subcortical hemispheric white matter. No large vessel territory infarction. No mass lesions. No sign of acute hemorrhage. No hydrocephalus or extra-axial collection. Vascular: Major vessels at the base of the brain show flow. Skull and upper cervical spine: Negative Sinuses/Orbits: Clear/normal Other: None significant IMPRESSION: Acute infarction affecting the left para median pons. Small focus of acute infarction in the right insular cortex. Extensive old ischemic changes throughout the brainstem, thalami and cerebral hemispheric white matter. Many of the old infarctions demonstrate hemosiderin deposition. Electronically Signed   By: Nelson Chimes M.D.   On: 02/28/2016 14:16       Assessment and plan discussed with with attending physician and they are in agreement.    Etta Quill PA-C [facility] [phone]  02/28/2016, 3:08 PM   The patient and reviewed the above note. My exam, the patient does have some mild right arm and leg weakness with some difficulty on finger-nose-finger and heel-knee-shin as well.   Assessment: 46 y.o. female with multiple stroke risk factors presenting to the hospital with acute infarct affecting the left paramedian pons in addition to the right insular cortex. I suspect this represents intermittent small vessel disease as opposed to embolic disease. She is being worked up for this. I suspect that some of her noncompliance is done due to financial  reasons that may do better now that she is approved for Medicaid.   Stroke Risk Factors - diabetes mellitus, hyperlipidemia and hypertension  1. HgbA1c, fasting lipid panel 2. MRI, MRA  of the brain without contrast 3. PT consult, OT consult, Speech consult 4. Echocardiogram 5. Carotid dopplers 6. Prophylactic therapy-Antiplatelet med: Aspirin - dose 325 mg daily 7. Risk factor modification 8. Telemetry monitoring 9. Frequent neuro checks 10 NPO until passes stroke swallow screen 11 please page stroke NP  Or  PA  Or MD from 8am -4 pm  as this patient from this time will be  followed by the stroke.   You can look them up on [email or web address]  Password [email or web address]   Roland Rack, MD [facility] [phone]  If 7pm-  7am, please page neurology on call as listed in [place].

## 2016-02-28 NOTE — Telephone Encounter (Signed)
Patient's husband Harvie Heck(Randy) called stating that he feels the patient has experienced another stroke and wanted an appointment to bring her in to see Dr. Zola ButtonLowne-Chase. Explained to husband that Dr. Zola ButtonLowne-Chase was not here today and if he felt the patient had suffered a stroke our office protocol is that he speak directly with a nurse. Transferred to [facility]. Spoke with [employer]

## 2016-02-28 NOTE — ED Notes (Signed)
Talked with family medicine MD, they will order something for blood pressure.

## 2016-02-28 NOTE — ED Notes (Signed)
IV attempt x3 unsuccessful, notified Joy, PA-C who states pt may go to  MRI

## 2016-02-28 NOTE — H&P (Signed)
[facility] Admission History and Physical Service Pager: [phone]  Patient name: Stephanie Lloyd Medical record number: [id number] Date of birth: [birth year] Age: 46 y.o. Gender: female  Primary Care Provider: Ann Held, DO Consultants: Neurology  Code Status: DO NOT RESUSCITATE/DO NOT INTUBATE  Chief Complaint: Generalized weakness with slurred speech  Assessment and Plan: Stephanie Lloyd is a 46 y.o. female presenting with generalized weakness with slurred speech. PMH is significant for h/o hemorrhagic CVA x2, IDDM, HTN.  #Acute ischemia stroke: MRI consistent with acute infarct affecting para median pons and small focus of acute infarct in the right insular cortex. No hemorrhaging or mass effect appreciated. Patient outside of window for tPA. Neurology consulted in ED.  Patient has not been taking aspirin for the past month. Has history of 2 prior hemorrhagic CVAs with residual right-sided weakness per chart review. Patient presenting with generalized weakness (in her legs) and slurring of speech since yesterday morning however 5/5 muscle strength all extremities. She has been taking her BP medications as prescribed. States she has not been taking pravastatin because her PCP discontinued this and is unsure why she does not take benazepril. History of medication noncompliance. This is likely the source of her recurrent stroke, however we will pursue stroke workup given new findings. No history of drug use and never smoker. Could be related to a coagulation disorder however this is unlikely. No personal or family history of blood clot. Denies history of palpitation but reports heart murmur. Doubt hypertensive emergency given normal range blood pressure on arrival to ED. BMP glucose 214.  --Admit to inpatient on neuro stepdown, attending Dr. Ardelia Mems --Neurology consulted,   -Will follow recommendations --Neuro checks q2h for 12 hours, then q4h --Will  allow permissive hypertension, but can start lowering slowly and cautiously with as needed labetalol per Dr. Tobias Alexander (neuro) --Cardiac monitoring --Pulse ox q2h for 12 hours, then q4h --PT/OT/SLP pending --Echocardiogram pending --Will obtain EKG 12-lead --Troponin 1 --I&Os --Risk stratification labs (TSH, lipid panel and A1c) pending  --We will give ASA 325 mg once.  --Atorvastatin 80 mg daily  #Hypertension: BP 147/86 on arrival to ED then went up to 218'/123's. Likely CVA drive and versus hypertensive emergency given near-normal BP on arrival to ED. Last dose on her blood pressure medications this morning. Takes Coreg, hydralazine at home. Has benazepril however patient does not take this for unknown reasons. Given MRI findings for acute ischemic stroke, will allow for permissive hypertension. --Cardiac monitoring --Permissive hypertension as stated above but can start lowering slowly and cautiously with as needed labetalol per Dr. Tobias Alexander (neuro) --Holding home Coreg, hydralazine, benazepril  #Insulin-dependent diabetes mellitus: CBG to 14 on admission. Last A1c 8.5 on 09/2015. Takes insulin NPH 70/30 15 units BID. Patient has a history of noncompliance with insulin use. --Sensitive sliding scale with meal coverage --Holding home NPH --Monitor CBGs --A1c pending  #Hyperlipidemia: automatically needs high intensity statins given history of CVA --Lipid panel --Atorvastatin 80 mg daily  FEN/GI:  --NPO pending RN swallow test  Prophylaxis:  --Lovenox SQ  Disposition: Pending improvement for acute ischemic stroke.  History of Present Illness:  Stephanie Lloyd is a 46 y.o. female presenting with generalized weakness with slurred speech. PMH is significant for h/o hemorrhagic CVA x2, IDDM, HTN.  Patient noted gradual onset generalized weakness and both arms and legs and slurring of speech yesterday morning around 0900 while laying in bed. Patient states she was accompanied by  her husband but  decided to wait hoping that weakness would pass. Patient denies loss of feeling, LOC, headache, change in vision, palpitations, dyspnea, nausea or vomiting. Patient states she has not taken her aspirin in over a month due to running out her medication. She has however taken her blood pressure medications this morning prior to arrival to [facility] ED. Patient also endorses eating oatmeal for breakfast this morning. Patient says she does not see a neurologist but has had 2 prior strokes in the past, last being 09/2015. Patient states PCP discontinued statin therapy but unsure why. Patient endorses occasional incontinence. Never smoker and denies IV drug use or EtOH use. Patient needs assistance with ADLs at home including cooking and cleaning and does not drive using a walker to ambulate. Father had a stroke in his 61s, and an MI however patient denies history of blood disorders. She was concerned he was experiencing another stroke given similar presentation to prior strokes and came to the [facility] ED.  Upon arrival, patient was hypertensive in the 190s/110s with slurred speech. CT negative for intracranial bleeds. MRI positive for acute infarct affecting para median pons and small focus of acute infarct in the right insular cortex. Neurology consult to however patient outside of window for tPA. Patient able to swallow at bedside was stable progressive hypertension. Will admit to neurology stepdown under family practice teaching service.  Review Of Systems: Complete review of systems performed. Refer to history of present illness for pertinent. Otherwise the remainder of the systems were negative.  Review of Systems  Constitutional: Negative for chills and fever.  Eyes: Negative for blurred vision and double vision.  Respiratory: Negative for cough, shortness of breath and wheezing.   Cardiovascular: Negative for chest pain, palpitations and leg swelling.  Gastrointestinal: Negative for abdominal pain,  blood in stool, constipation, diarrhea, nausea and vomiting.  Genitourinary: Negative for dysuria, frequency and urgency.  Musculoskeletal: Negative for falls, myalgias and neck pain.  Skin: Negative for rash.  Neurological: Positive for speech change and weakness. Negative for dizziness, sensory change, loss of consciousness and headaches.  Endo/Heme/Allergies: Negative for polydipsia. Does not bruise/bleed easily.    Patient Active Problem List   Diagnosis Date Noted  . CVA (cerebral vascular accident) ([place]) 02/28/2016  . Acute kidney injury ([place]) 10/28/2015  . Acute encephalopathy 10/28/2015  . Diastolic dysfunction   . Stroke (cerebrum) ([place])   . Focal infarction of brain ([place]) 10/08/2015  . Syncope 08/19/2015  . Hypertensive emergency 07/29/2015  . Hypertensive urgency 07/28/2015  . Hypokalemia 07/28/2015  . Accelerated hypertension 09/21/2014  . Type 2 diabetes mellitus with other circulatory complications 47/65/4650  . Hyperlipidemia 09/21/2014  . 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 Hyperlipidemia LDL goal <70 07/29/2014  . Hemorrhagic stroke ([place]) 06/22/2014  . ICH (intracerebral hemorrhage) ([place]) 06/22/2014  . Hypertension 05/13/2012  . Diabetes mellitus ([place]) 05/13/2012    Past Medical History: Past Medical History:  Diagnosis Date  . Diabetes ([place])   . Diabetes mellitus without complication ([place])   . Diastolic dysfunction   . Hypertension   . Immune deficiency disorder ([place])   . Stroke [facility])     Past Surgical History: No past surgical history on file.  Social History: Social History  Substance Use Topics  . Smoking status: Never Smoker  . Smokeless tobacco: Never Used  . Alcohol use No     Comment: occ   Additional social history: Lives at home with husband and several other family members. Uses walker to ambulate in-house.  Does not drive. Never smoker, denies EtOH or IV drug use. Please also refer to relevant sections of EMR.  Family  History: No family history on file. Father: Hypertension, diabetes, MI and stroke in 55s Mother: None  Allergies and Medications: Allergies  Allergen Reactions  . Metformin And Related Diarrhea   No current facility-administered medications on file prior to encounter.    Current Outpatient Prescriptions on File Prior to Encounter  Medication Sig Dispense Refill  . ACCU-CHEK FASTCLIX LANCETS MISC Check blood sugar twice daily. Dx E:11.9 102 each 12  . aspirin EC 81 MG EC tablet Take 1 tablet (81 mg total) by mouth daily. 30 tablet 0  . benazepril (LOTENSIN) 40 MG tablet Take 1 tablet (40 mg total) by mouth daily. 30 tablet 3  . Blood Glucose Monitoring Suppl (ACCU-CHEK GUIDE) w/Device KIT 1 Device by Does not apply route as directed. Check blood sugar daily 1 kit 0  . carvedilol (COREG) 25 MG tablet Take 1 tablet (25 mg total) by mouth 2 (two) times daily with a meal. 60 tablet 0  . glucose blood (ACCU-CHEK GUIDE) test strip Check blood sugar twice daily. Dx E:11.9 100 each 12  . hydrALAZINE (APRESOLINE) 50 MG tablet Take 1 tablet (50 mg total) by mouth 3 (three) times daily. 90 tablet 3  . insulin NPH-regular Human (NOVOLIN 70/30 RELION) (70-30) 100 UNIT/ML injection Inject 15 Units into the skin 2 (two) times daily with a meal. 10 mL 2  . pravastatin (PRAVACHOL) 40 MG tablet Take 1 tablet (40 mg total) by mouth daily at 6 PM. Please call office ASAP to schedule an Office Visit. 30 tablet 0  . UNABLE TO FIND Outpatient physical therapy   Diagnosis: stroke, physical deconditioning 1 Mutually Defined 0    Objective: BP (!) 195/119 (BP Location: Right Arm)   Pulse 87   Temp 97.6 F (36.4 C) (Oral)   Resp 18   Wt 196 lb (88.9 kg)   LMP 02/07/2016   SpO2 99%   BMI 28.12 kg/m  Exam: General: well nourished, well developed, in no acute distress with non-toxic appearance HEENT: normocephalic, atraumatic, moist mucous membranes Neck: supple, non-tender without lymphadenopathy CV:  regular rate and rhythm without murmurs, 2/6 SEM over right and left upper sternal borders, no carotid bruits, rubs, or gallops Lungs: clear to auscultation bilaterally with normal work of breathing Abdomen: soft, non-tender, no masses or organomegaly palpable, normoactive bowel sounds Skin: warm, dry, no rashes or lesions, cap refill < 2 seconds Extremities: warm and well perfused, normal tone, 5/5 motor strength in all extremities Neuro: Awake, alert and oriented 4, CNII-XII intact with exception to minimal left facial droop, slurred speech, no sensory deficit, motor 5/5 in all extremities, coordination intact with finger to nose but slow. Negative pronator drift. Gait deferred  Labs and Imaging: CBC BMET   Recent Labs Lab 02/28/16 1218  WBC 10.9*  HGB 13.0  HCT 40.3  PLT 190    Recent Labs Lab 02/28/16 1218  NA 136  K 3.7  CL 105  CO2 24  BUN 17  CREATININE 1.46*  GLUCOSE 214*  CALCIUM 9.2     Urinalysis    Component Value Date/Time   COLORURINE YELLOW 02/28/2016 1303   APPEARANCEUR HAZY (A) 02/28/2016 1303   LABSPEC 1.005 02/28/2016 1303   PHURINE 6.0 02/28/2016 1303   GLUCOSEU 50 (A) 02/28/2016 1303   HGBUR LARGE (A) 02/28/2016 1303   BILIRUBINUR NEGATIVE 02/28/2016 1303   BILIRUBINUR neg 02/16/2015 1535  KETONESUR NEGATIVE 02/28/2016 1303   PROTEINUR NEGATIVE 02/28/2016 1303   UROBILINOGEN 0.2 02/16/2015 1535   UROBILINOGEN 0.2 12/12/2011 1135   NITRITE NEGATIVE 02/28/2016 1303   LEUKOCYTESUR SMALL (A) 02/28/2016 1303   Ct Head Wo Contrast  Result Date: 02/28/2016 CLINICAL DATA:  Possible stroke, symptoms began yesterday morning with slurred speech and weakness EXAM: CT HEAD WITHOUT CONTRAST TECHNIQUE: Contiguous axial images were obtained from the base of the skull through the vertex without intravenous contrast. COMPARISON:  CT brain scan of 10/28/2015 FINDINGS: Brain: The ventricular system remains prominent, as are the cortical sulci, consistent with  atrophy advanced for age. The septum is midline in position. Somewhat prominent cerebellar folia also are noted. The fourth ventricle and basilar cisterns are unremarkable. No hemorrhage, mass effect or in acute infarction is noted. There is significant small vessel ischemic change throughout the periventricular white matter for age which may have advanced slightly in the interval as well. Vascular: No vascular abnormality is seen on this unenhanced study. Skull: On bone window images, no calvarial abnormality is seen. Sinuses/Orbits: There is no evidence of sinusitis. A probable retention cyst is noted anteriorly in the left maxillary sinus. Other: 1. Advanced diffuse atrophy for age with some progression of small vessel ischemic change. No acute intracranial abnormality. 2. 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 Electronically Signed   By: Ivar Drape M.D.   On: 02/28/2016 12:16   Mr Brain Wo Contrast  Result Date: 02/28/2016 CLINICAL DATA:  Generalize weakness beginning yesterday. Unable to get out of bit head. EXAM: MRI HEAD WITHOUT CONTRAST TECHNIQUE: Multiplanar, multiecho pulse sequences of the brain and surrounding structures were obtained without intravenous contrast. COMPARISON:  Head CT same day.  MRI 10/08/2015 FINDINGS: Brain: Diffusion imaging shows acute infarction within the left side of the pons. There is a punctate acute infarction in the deep insular cortex on the right. Chronic small-vessel ischemic changes affect the pons bilaterally. No focal cerebellar insult. Cerebral hemispheres show old hemorrhagic infarction in both thalami and throughout the deep and subcortical hemispheric white matter. No large vessel territory infarction. No mass lesions. No sign of acute hemorrhage. No hydrocephalus or extra-axial collection. Vascular: Major vessels at the base of the brain show flow. Skull and upper cervical spine: Negative Sinuses/Orbits: Clear/normal Other: None significant  IMPRESSION: Acute infarction affecting the left para median pons. Small focus of acute infarction in the right insular cortex. Extensive old ischemic changes throughout the brainstem, thalami and cerebral hemispheric white matter. Many of the old infarctions demonstrate hemosiderin deposition. Electronically Signed   By: Nelson Chimes M.D.   On: 02/28/2016 14:16     [place] Bing, DO 02/28/2016, 3:01 PM PGY-1, [facility] pager: [phone], text pages welcome  I have seen and evaluated the patient with Dr. Yisroel Ramming. I am in agreement with the note above in its revised form. My additions are in red.  Wendee Beavers, MD, PGY-2 02/28/2016 5:59 PM

## 2016-02-28 NOTE — Telephone Encounter (Signed)
Per the patient's chart, she's currently being seen at the ED.

## 2016-02-28 NOTE — Telephone Encounter (Signed)
°  Patient Name: Stephanie BalsamMARVA Lloyd  DOB: 10/23/1970    Initial Comment Caller says that his wife may have experienced a stroked. She weakness in her arms and legs, blurred vision, and slurred speach   Nurse Assessment  Nurse: Renaldo FiddlerAdkins, RN, Raynelle FanningJulie Date/Time Stephanie Lloyd(Eastern Time): 02/28/2016 9:13:21 AM  Confirm and document reason for call. If symptomatic, describe symptoms. ---Caller says that his wife may have experienced a stroke. She weakness in her arms and legs, blurred vision, and slurred speech. States her sx began on Tuesday. BS is 161. She has hx of CVA  Does the patient have any new or worsening symptoms? ---Yes  Will a triage be completed? ---Yes  Related visit to physician within the last 2 weeks? ---No  Does the PT have any chronic conditions? (i.e. diabetes, asthma, etc.) ---Yes  List chronic conditions. ---HX CVA, Diabetic, Htn  Is the patient pregnant or possibly pregnant? (Ask all females between the ages of 2512-55) ---No  Is this a behavioral health or substance abuse call? ---No     Guidelines    Guideline Title Affirmed Question Affirmed Notes  Neurologic Deficit [1] SEVERE weakness (i.e., unable to walk or barely able to walk, requires support) AND [2] new onset or worsening    Final Disposition User   Call EMS 911 Now Renaldo FiddlerAdkins, RN, Raynelle FanningJulie    Disagree/Comply: Comply   Reason: Attempted cb x2, left message on primary number which was verified w/caller. 02/28/2016 9:31:59 AM Send To RN Personal Renaldo FiddlerAdkins, RN, Raynelle FanningJulie 02/28/2016 10:01:55 AM 911 Outcome Documentation Renaldo FiddlerAdkins, RN, Raynelle FanningJulie Reason: 4 attempts for 911 FU, no answer

## 2016-02-28 NOTE — Progress Notes (Signed)
Brief FPTS Attending Admit note  I have seen & examined patient. Briefly 46 yo F with history of hypertension, hyperlipidemia, diabetes, prior hemorrhagic strokes presenting with new onset dysarthria and generalized weakness, found to have acute infarct. Neuro has seen, planning for full stroke workup.  Blood pressure has been quite high in the ED -given hydralazine with improvement I would favor not doing permissive hypertension given she is >24 hours after onset of symptoms and she has history of hemorrhagic strokes. Spoke with Dr. Alanda SlimGonfa - he will clarify blood pressure goals with neurology now.  Will sign resident H&P when it is complete.  Levert FeinsteinBrittany McIntyre, MD [facility]

## 2016-02-28 NOTE — ED Provider Notes (Signed)
[facility] DEPT Provider Note   CSN: [id number] Arrival date & time: 02/28/16  1047     History   Chief Complaint Chief Complaint  Patient presents with  . Weakness    HPI Stephanie Lloyd is a 46 y.o. female.  HPI    Stephanie Lloyd is a 46 y.o. female, with a history of Hemorrhagic CVAx2, DM, and HTN, presenting to the ED with slurred speech and generalized weakness beginning around 9 AM yesterday. Pt states that her speech has been slurred since her previous strokes, but this increased yesterday and has not recovered. Pt states, "I think I had a stroke yesterday."  Last stroke was in September 2017 which produced weakness and numbness on the right side. Pt also had a stroke in May 2016 producing weakness and numbness to the left side of her body.   Husband states this morning patient wasn't able to sit upright. Endorses facial droop on the left that has somewhat improved.  Denies fever/chills, N/V/D, cough, shortness of breath, chest pain, falls/trauma, or any other complaints.   Pt did not follow up with a neurologist following her stroke in Sept 2017.    Past Medical History:  Diagnosis Date  . Diabetes ([place])   . Diabetes mellitus without complication ([place])   . Diastolic dysfunction   . Hypertension   . Immune deficiency disorder ([place])   . Stroke [facility])     Patient Active Problem List   Diagnosis Date Noted  . CVA (cerebral vascular accident) ([place]) 02/28/2016  . Acute kidney injury ([place]) 10/28/2015  . Acute encephalopathy 10/28/2015  . Diastolic dysfunction   . Stroke (cerebrum) ([place])   . Focal infarction of brain ([place]) 10/08/2015  . Syncope 08/19/2015  . Hypertensive emergency 07/29/2015  . Hypertensive urgency 07/28/2015  . Hypokalemia 07/28/2015  . Accelerated hypertension 09/21/2014  . Type 2 diabetes mellitus with other circulatory complications 42/70/6237  . Hyperlipidemia 09/21/2014  . 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  Hyperlipidemia LDL goal <70 07/29/2014  . Hemorrhagic stroke ([place]) 06/22/2014  . ICH (intracerebral hemorrhage) ([place]) 06/22/2014  . Hypertension 05/13/2012  . Diabetes mellitus ([place]) 05/13/2012    No past surgical history on file.  OB History    Gravida Para Term Preterm AB Living   0 0 0 0 0     SAB TAB Ectopic Multiple Live Births   0 0 0           Home Medications    Prior to Admission medications   Medication Sig Start Date End Date Taking? Authorizing Provider  ACCU-CHEK FASTCLIX LANCETS MISC Check blood sugar twice daily. Dx E:11.9 09/08/15   Rosalita Chessman Chase, DO  aspirin EC 81 MG EC tablet Take 1 tablet (81 mg total) by mouth daily. 10/10/15   Modena Jansky, MD  benazepril (LOTENSIN) 40 MG tablet Take 1 tablet (40 mg total) by mouth daily. 10/30/15   Ripudeep Krystal Eaton, MD  Blood Glucose Monitoring Suppl (ACCU-CHEK GUIDE) w/Device KIT 1 Device by Does not apply route as directed. Check blood sugar daily 09/08/15   Rosalita Chessman Chase, DO  carvedilol (COREG) 25 MG tablet Take 1 tablet (25 mg total) by mouth 2 (two) times daily with a meal. 10/10/15   Modena Jansky, MD  glucose blood (ACCU-CHEK GUIDE) test strip Check blood sugar twice daily. Dx E:11.9 09/08/15   Rosalita Chessman Chase, DO  hydrALAZINE (APRESOLINE) 50 MG tablet Take 1 tablet (50 mg total)  by mouth 3 (three) times daily. 10/30/15   Ripudeep K Rai, MD  insulin NPH-regular Human (NOVOLIN 70/30 RELION) (70-30) 100 UNIT/ML injection Inject 15 Units into the skin 2 (two) times daily with a meal. 12/07/15   [name], DO  pravastatin (PRAVACHOL) 40 MG tablet Take 1 tablet (40 mg total) by mouth daily at 6 PM. Please call office ASAP to schedule an Office Visit. 12/15/15   [name], DO  UNABLE TO FIND Outpatient physical therapy   Diagnosis: stroke, physical deconditioning 10/29/15   Ripudeep K Rai, MD    Family History No family history on file.  Social History Social History  Substance Use  Topics  . Smoking status: Never Smoker  . Smokeless tobacco: Never Used  . Alcohol use No     Comment: occ     Allergies   Metformin and related   Review of Systems Review of Systems  Constitutional: Negative for chills and fever.  Respiratory: Negative for cough and shortness of breath.   Gastrointestinal: Negative for diarrhea, nausea and vomiting.  Neurological: Positive for speech difficulty and weakness. Negative for dizziness, light-headedness, numbness (none acute) and headaches.  All other systems reviewed and are negative.    Physical Exam Updated Vital Signs BP 147/86 (BP Location: Right Arm)   Pulse 68   Temp 97.6 F (36.4 C) (Oral)   Resp 14   Wt 88.9 kg   LMP 02/07/2016   SpO2 98%   BMI 28.12 kg/m   Physical Exam  Constitutional: She is oriented to person, place, and time. She appears well-developed and well-nourished. No distress.  HENT:  Head: Normocephalic and atraumatic.  Mouth/Throat: Oropharynx is clear and moist.  Eyes: Conjunctivae and EOM are normal. Pupils are equal, round, and reactive to light.  Neck: Neck supple.  Cardiovascular: Normal rate, regular rhythm, normal heart sounds and intact distal pulses.   Pulmonary/Chest: Effort normal and breath sounds normal. No respiratory distress.  Abdominal: Soft. There is no tenderness. There is no guarding.  Musculoskeletal: She exhibits no edema.  Lymphadenopathy:    She has no cervical adenopathy.  Neurological: She is alert and oriented to person, place, and time.  Possible left sided facial droop. Slurred speech noted.  No acute sensory deficits indicated by the patient. Strength 5/5 in all extremities, possibly weaker on the left. Coordination intact including heel to shin and finger to nose. Cranial nerves III-XII otherwise grossly intact.    Skin: Skin is warm and dry. She is not diaphoretic.  Psychiatric: She has a normal mood and affect. Her behavior is normal.  Nursing note and vitals  reviewed.    ED Treatments / Results  Labs (all labs ordered are listed, but only abnormal results are displayed) Labs Reviewed  BASIC METABOLIC PANEL - Abnormal; Notable for the following:       Result Value   Glucose, Bld 214 (*)    Creatinine, Ser 1.46 (*)    GFR calc non Af Amer 42 (*)    GFR calc Af Amer 48 (*)    All other components within normal limits  CBC WITH DIFFERENTIAL/PLATELET - Abnormal; Notable for the following:    WBC 10.9 (*)    Neutro Abs 8.9 (*)    All other components within normal limits  URINALYSIS, ROUTINE W REFLEX MICROSCOPIC - Abnormal; Notable for the following:    APPearance HAZY (*)    Glucose, UA 50 (*)    Hgb urine dipstick LARGE (*)      Leukocytes, UA SMALL (*)    Bacteria, UA RARE (*)    Squamous Epithelial / LPF 0-5 (*)    All other components within normal limits  I-STAT BETA HCG BLOOD, ED ([facility], [facility], [facility] ONLY)    EKG  EKG Interpretation None       Radiology Ct Head Wo Contrast  Result Date: 02/28/2016 CLINICAL DATA:  Possible stroke, symptoms began yesterday morning with slurred speech and weakness EXAM: CT HEAD WITHOUT CONTRAST TECHNIQUE: Contiguous axial images were obtained from the base of the skull through the vertex without intravenous contrast. COMPARISON:  CT brain scan of 10/28/2015 FINDINGS: Brain: The ventricular system remains prominent, as are the cortical sulci, consistent with atrophy advanced for age. The septum is midline in position. Somewhat prominent cerebellar folia also are noted. The fourth ventricle and basilar cisterns are unremarkable. No hemorrhage, mass effect or in acute infarction is noted. There is significant small vessel ischemic change throughout the periventricular white matter for age which may have advanced slightly in the interval as well. Vascular: No vascular abnormality is seen on this unenhanced study. Skull: On bone window images, no calvarial abnormality is seen. Sinuses/Orbits: There is no evidence  of sinusitis. A probable retention cyst is noted anteriorly in the left maxillary sinus. Other: 1. Advanced diffuse atrophy for age with some progression of small vessel ischemic change. No acute intracranial abnormality. 2. 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 Electronically Signed   By: Paul  Barry M.D.   On: 02/28/2016 12:16   Mr Brain Wo Contrast  Result Date: 02/28/2016 CLINICAL DATA:  Generalize weakness beginning yesterday. Unable to get out of bit head. EXAM: MRI HEAD WITHOUT CONTRAST TECHNIQUE: Multiplanar, multiecho pulse sequences of the brain and surrounding structures were obtained without intravenous contrast. COMPARISON:  Head CT same day.  MRI 10/08/2015 FINDINGS: Brain: Diffusion imaging shows acute infarction within the left side of the pons. There is a punctate acute infarction in the deep insular cortex on the right. Chronic small-vessel ischemic changes affect the pons bilaterally. No focal cerebellar insult. Cerebral hemispheres show old hemorrhagic infarction in both thalami and throughout the deep and subcortical hemispheric white matter. No large vessel territory infarction. No mass lesions. No sign of acute hemorrhage. No hydrocephalus or extra-axial collection. Vascular: Major vessels at the base of the brain show flow. Skull and upper cervical spine: Negative Sinuses/Orbits: Clear/normal Other: None significant IMPRESSION: Acute infarction affecting the left para median pons. Small focus of acute infarction in the right insular cortex. Extensive old ischemic changes throughout the brainstem, thalami and cerebral hemispheric white matter. Many of the old infarctions demonstrate hemosiderin deposition. Electronically Signed   By: Mark  Shogry M.D.   On: 02/28/2016 14:16    Procedures Procedures (including critical care time)  Medications Ordered in ED Medications  sodium chloride 0.9 % bolus 1,000 mL (not administered)     Initial Impression / Assessment and  Plan / ED Course  I have reviewed the triage vital signs and the nursing notes.  Pertinent labs & imaging results that were available during my care of the patient were reviewed by me and considered in my medical decision making (see chart for details).     Patient presents with new slurred speech, facial droop, and generalized weakness beginning yesterday. 12:09 PM Spoke with Dr. Kirkpatrick, neurologist. States complaints sound nonspecific and may be due to an infectious cause. Recommends MRI to evaluate further.  2:25 PM MRI indicates an acute infarct. Dr. Kirkpatrick updated.   Requests admission and he will consult.  Patient was updated with the results of the MRI and plan for admission. Pt agrees to the plan. 2:51 PM Spoke with Dr. Yisroel Ramming, Family medicine resident, who agreed to admit the patient to inpatient stepdown under attending Ardelia Mems.  Findings and plan of care discussed with Margette Fast, MD. Dr. Laverta Baltimore personally evaluated and examined this patient.  Vitals:   02/28/16 1102 02/28/16 1105 02/28/16 1106 02/28/16 1255  BP: 147/86 147/86  (!) 195/119  Pulse: 70 68  87  Resp: _0 Temp:  97.6 F (36.4 C)    TempSrc:  Oral    SpO2: 99% 98%  99%  Weight:   88.9 kg       Final Clinical Impressions(s) / ED Diagnoses   Final diagnoses:  Cerebrovascular accident (CVA), unspecified mechanism [facility])    New Prescriptions New Prescriptions   No medications on file     Lorayne Bender, PA-C 02/28/16 [place], MD 02/28/16 1945

## 2016-02-28 NOTE — ED Notes (Signed)
Family medicine service paged about current blood pressure.

## 2016-02-28 NOTE — ED Notes (Signed)
Placed patient on a bedpan to use 

## 2016-02-29 ENCOUNTER — Inpatient Hospital Stay (HOSPITAL_COMMUNITY): Payer: Medicaid Other

## 2016-02-29 DIAGNOSIS — Z8673 Personal history of transient ischemic attack (TIA), and cerebral infarction without residual deficits: Secondary | ICD-10-CM

## 2016-02-29 DIAGNOSIS — Z8679 Personal history of other diseases of the circulatory system: Secondary | ICD-10-CM

## 2016-02-29 DIAGNOSIS — I6302 Cerebral infarction due to thrombosis of basilar artery: Secondary | ICD-10-CM

## 2016-02-29 DIAGNOSIS — I509 Heart failure, unspecified: Secondary | ICD-10-CM

## 2016-02-29 LAB — GLUCOSE, CAPILLARY
GLUCOSE-CAPILLARY: 170 mg/dL — AB (ref 65–99)
Glucose-Capillary: 161 mg/dL — ABNORMAL HIGH (ref 65–99)
Glucose-Capillary: 176 mg/dL — ABNORMAL HIGH (ref 65–99)
Glucose-Capillary: 169 mg/dL — ABNORMAL HIGH (ref 65–99)

## 2016-02-29 LAB — LIPID PANEL
CHOL/HDL RATIO: 3 ratio
Cholesterol: 170 mg/dL (ref 0–200)
HDL: 56 mg/dL (ref >40)
LDL Cholesterol: 99 mg/dL (ref 0–99)
Triglycerides: 74 mg/dL (ref <150)
VLDL: 15 mg/dL (ref 0–40)

## 2016-02-29 LAB — ECHOCARDIOGRAM COMPLETE
HEIGHTINCHES: 70 in
WEIGHTICAEL: 3020.8 [oz_av]

## 2016-02-29 LAB — MRSA PCR SCREENING: MRSA by PCR: NEGATIVE

## 2016-02-29 LAB — TROPONIN I: Troponin I: <0.03 ng/mL (ref <0.03)

## 2016-02-29 MED ORDER — BENAZEPRIL HCL 10 MG PO TABS: 40.0000 mg | ORAL_TABLET | Freq: Every day | ORAL | Status: DC

## 2016-02-29 MED ORDER — LABETALOL HCL 5 MG/ML IV SOLN: 10.0000 mg | INTRAVENOUS | Status: DC | PRN

## 2016-02-29 MED ORDER — ASPIRIN EC 325 MG PO TBEC
325.0000 mg | DELAYED_RELEASE_TABLET | Freq: Every day | ORAL | Status: DC
  Administered 2016-02-29 – 2016-03-01 (×2): 325 mg via ORAL
  Filled 2016-02-29 (×2): qty 1

## 2016-02-29 MED ORDER — LISINOPRIL 10 MG PO TABS
10.0000 mg | ORAL_TABLET | Freq: Every day | ORAL | Status: DC
  Administered 2016-02-29 – 2016-03-01 (×2): 10 mg via ORAL
  Filled 2016-02-29 (×2): qty 1

## 2016-02-29 MED ORDER — BENAZEPRIL HCL 10 MG PO TABS: 10.0000 mg | ORAL_TABLET | Freq: Every day | ORAL | Status: DC

## 2016-02-29 NOTE — Progress Notes (Signed)
STROKE TEAM PROGRESS NOTE   HISTORY OF PRESENT ILLNESS (per record) Stephanie Lloyd is an 46 y.o. female with a history of hemorrhagic CVA, diabetes and hypertension who presented to the ED with slurred speech and generalized weakness. She states that she believes that it started around 9 AM yesterday but isn't really clear (LKW 02/26/2016, time unknown). She did not seek any medical attention at that time. She denies any specific localizing or lateralizing weakness but states that she is generally weak. She's not been taking her aspirin as she has not been financially able to take her aspirin. Due to the weakness she was brought to the emergency department.  Initial CT showed advanced diffuse atrophy but no acute intracranial infarct.  Follow-up MRI did show an acute infarct affecting the left. Medium pons. And a small focus of acute infarct in the right insular cortex. Also has extensive old ischemic changes throughout the brain stem, thalami, cerebral hemispheres and white matter. Currently patient's clinical symptoms is predominantly dysarthria. Patient was not administered IV t-PA secondary to unknown last known well. She was admitted for further evaluation and treatment.   SUBJECTIVE (INTERVAL HISTORY) Husband and son are at bedside. Pt main complain this time was dysarthria and generalized weakness, no focal deficit. She admitted that her glucose at home 180s to 200s. Denies any palpitation or heart racing.    OBJECTIVE Temp:  [97.3 F (36.3 C)-98.8 F (37.1 C)] 97.4 F (36.3 C) (02/01 1309) Pulse Rate:  [62-84] 84 (02/01 1309) Cardiac Rhythm: Normal sinus rhythm (02/01 0840) Resp:  [11-21] 16 (02/01 1309) BP: (126-218)/(71-123) 189/108 (02/01 0802) SpO2:  [96 %-100 %] 98 % (02/01 0802) Weight:  [85.6 kg (188 lb 12.8 oz)] 85.6 kg (188 lb 12.8 oz) (01/31 1646)  CBC:  Recent Labs Lab 02/28/16 1218  WBC 10.9*  NEUTROABS 8.9*  HGB 13.0  HCT 40.3  MCV 83.1  PLT 190    Basic  Metabolic Panel:  Recent Labs Lab 02/28/16 1218  NA 136  K 3.7  CL 105  CO2 24  GLUCOSE 214*  BUN 17  CREATININE 1.46*  CALCIUM 9.2    Lipid Panel:    Component Value Date/Time   CHOL 170 02/29/2016 0305   TRIG 74 02/29/2016 0305   HDL 56 02/29/2016 0305   CHOLHDL 3.0 02/29/2016 0305   VLDL 15 02/29/2016 0305   LDLCALC 99 02/29/2016 0305   HgbA1c:  Lab Results  Component Value Date   HGBA1C 8.5 (H) 10/09/2015   Urine Drug Screen:    Component Value Date/Time   LABOPIA NONE DETECTED 10/28/2015 1112   COCAINSCRNUR NONE DETECTED 10/28/2015 1112   LABBENZ NONE DETECTED 10/28/2015 1112   AMPHETMU NONE DETECTED 10/28/2015 1112   THCU NONE DETECTED 10/28/2015 1112   LABBARB NONE DETECTED 10/28/2015 1112      IMAGING I have personally reviewed the radiological images below and agree with the radiology interpretations.  Ct Head Wo Contrast 02/28/2016 1. Advanced diffuse atrophy for age with some progression of small vessel ischemic change. No acute intracranial abnormality. 2. 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   Mr Brain Wo Contrast 02/28/2016 Acute infarction affecting the left para median pons. Small focus of acute infarction in the right insular cortex. Extensive old ischemic changes throughout the brainstem, thalami and cerebral hemispheric white matter. Many of the old infarctions demonstrate hemosiderin deposition.   Ct Angio Head and Neck W Or Wo Contrast 10/08/2015 1. No acute arterial finding.  2. Intracranial  atherosclerosis with notable high-grade left P2 segment stenosis.  3. Mild atherosclerosis of the neck without stenosis.  4. Advanced chronic microvascular disease. Known acute brainstem infarct without evidence of progression or hemorrhage.  5. Small layering pleural effusions.  Mr Brain Wo Contrast 10/08/2015 1. Acute 7 mm nonhemorrhagic infarct within the posterior inferior left midbrain and pons on the left.  2. Multiple more  remote lacunar infarcts are present throughout the lower midbrain and upper pons bilaterally.  3. Multiple remote lacunar infarcts are present throughout the basal ganglia.  4. Advanced diffuse white matter disease.  5. Multiple foci of susceptibility throughout the brainstem and bilateral cerebral hemispheres suggesting underlying vasculitis.   TTE - Left ventricle: The cavity size was normal. Wall thickness was   increased in a pattern of moderate LVH. Systolic function was   normal. The estimated ejection fraction was in the range of 60%   to 65%. Wall motion was normal; there were no regional wall   motion abnormalities. Doppler parameters are consistent with   abnormal left ventricular relaxation (grade 1 diastolic   dysfunction). - Aortic valve: There was trivial regurgitation. - Mitral valve: There was mild regurgitation Impressions: - Normal LV systolic function; grade 1 diastolic dysfunction;   moderate LVH; trace AI; mild MR.   PHYSICAL EXAM  Temp:  [97.3 F (36.3 C)-98.1 F (36.7 C)] 98.1 F (36.7 C) (02/01 2004) Pulse Rate:  [62-84] 72 (02/01 2004) Resp:  [11-21] 19 (02/01 2004) BP: (148-189)/(75-109) 165/96 (02/01 2004) SpO2:  [98 %-100 %] 99 % (02/01 2004)  General - Well nourished, well developed, in no apparent distress.  Ophthalmologic - Fundi not visualized due to small pupils.  Cardiovascular - Regular rate and rhythm.  Mental Status -  Level of arousal and orientation to time, place, and person were intact. Language including expression, naming, repetition, comprehension was assessed and found intact. Fund of Knowledge was assessed and was intact.  Cranial Nerves II - XII - II - Visual field intact OU. III, IV, VI - Extraocular movements intact. V - Facial sensation intact bilaterally. VII - Facial movement intact bilaterally. VIII - Hearing & vestibular intact bilaterally. X - Palate elevates symmetrically, mild dysarthria. XI - Chin turning &  shoulder shrug intact bilaterally. XII - Tongue protrusion intact.  Motor Strength - The patient's strength was normal in all extremities and pronator drift was absent.  Bulk was normal and fasciculations were absent.   Motor Tone - Muscle tone was assessed at the neck and appendages and was normal.  Reflexes - The patient's reflexes were 1+ in all extremities and she had no pathological reflexes.  Sensory - Light touch, temperature/pinprick were assessed and were symmetrical.    Coordination - The patient had normal movements in the hands and feet with no ataxia or dysmetria.  Tremor was absent.  Gait and Station - deferred   ASSESSMENT/PLAN Stephanie Lloyd is a 46 y.o. female with history of hemorrhagic CVA, diabetes and hypertension presenting with slurred speech and generalized weakness. She did not receive IV t-PA due to being out of the window.   Stroke:  L paramedian pontine and small R insular cortical infarct. Given multiple risk factors, as well as extensive hx of lacunar infarcts, right thalamic ICH, extensive microbleds, and significant white matter ischemic changes, the current event still most likely secondary to small vessel disease. Due to young age, single gene mutation associated vasculopathy such as COL4A1, COL4A2, CADASIL and CARASIL need to be considered. Ideally,  genetic testing would be a great tool.  Resultant  dysarthria  MRI  left paramedian pontine infarct. Small right insular cortex infarct. Extensive old ischemic changes.   CTA head & neck 09/2015  High grade L P2 stenosis, intracranial atherosclerosis  Carotid Doppler  08/2015 no significant stenosis  2D Echo EF 60-65% and no SOE.   Currently against further embolic work up as pt is not a good candidate for anticoagulation due to hx of ICH and extensive microbleds on MRI  LDL 99  HgbA1c pending, 09/2015 = 8.5  Lovenox 40 mg sq daily for VTE prophylaxis  Diet Carb Modified Fluid consistency: Thin;  Room service appropriate? Yes  aspirin 81 mg daily prior to admission, now on aspirin 325 mg daily. Continue ASA 325mg  on discharge. 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 Please call with questions. Pt will follow up with Dr. Roda Shutters at [facility] in about 6 weeks. Thanks for the consult.  Marvel Plan, MD PhD Stroke Neurology 02/29/2016 9:12 PM   To contact Stroke Continuity provider, please refer to [email or web address]. After hours, contact General Neurology

## 2016-02-29 NOTE — Evaluation (Signed)
Occupational Therapy Evaluation Patient Details Name: Stephanie Lloyd MRN: [id number] DOB: 01/27/1971 Today's Date: 02/29/2016    History of Present Illness Pt admitted with slurred speech and generalized weakness. MRI + for acute infarct para median pons, R insular cortex. PMH: hemorrhagic CVA x 2, DM, HTN, HLD.   Clinical Impression   Pt was ambulating with a RW and assisted for IADL, but independent in self care prior to admission. Pt presenting with generalized weakness and impaired balance. Pt reports having little therapy following her previous CVAs, but now has Medicaid and is agreeable to OPOT. Will follow acutely. Recommending shower seat with a back and supervision for showering.    Follow Up Recommendations  Outpatient OT    Equipment Recommendations  Tub/shower seat    Recommendations for Other Services       Precautions / Restrictions Precautions Precautions: Fall Restrictions Weight Bearing Restrictions: No      Mobility Bed Mobility Overal bed mobility: Modified Independent                Transfers Overall transfer level: Needs assistance Equipment used: 1 person hand held assist Transfers: Sit to/from Stand Sit to Stand: Min guard         General transfer comment: min guard for safety, no physical assist    Balance Overall balance assessment: Needs assistance Sitting-balance support: Feet supported Sitting balance-Leahy Scale: Fair Sitting balance - Comments: posterior lean when testing UEs, no LOB with donning socks Postural control: Posterior lean   Standing balance-Leahy Scale: Fair                              ADL Overall ADL's : Needs assistance/impaired Eating/Feeding: Set up;Sitting Eating/Feeding Details (indicate cue type and reason): reports sometimes needing assist for opening containers Grooming: Wash/dry hands;Standing;Min guard   Upper Body Bathing: Set up;Sitting   Lower Body Bathing: Min guard;Sit  to/from stand   Upper Body Dressing : Set up;Sitting   Lower Body Dressing: Min guard;Sit to/from stand   Toilet Transfer: Minimal assistance;Ambulation;Comfort height toilet   Toileting- Clothing Manipulation and Hygiene: Sit to/from stand;Min guard       Functional mobility during ADLs: Minimal assistance (hand held)       Vision     Perception     Praxis      Pertinent Vitals/Pain Pain Assessment: No/denies pain     Hand Dominance Right   Extremity/Trunk Assessment Upper Extremity Assessment Upper Extremity Assessment: RUE deficits/detail;LUE deficits/detail RUE Deficits / Details: + mild drift, 4-/5 strength RUE Sensation: decreased proprioception (does not incorporate in B UE activities with consistency) LUE Deficits / Details: generalized weakness, 4/5 strength   Lower Extremity Assessment Lower Extremity Assessment: Defer to PT evaluation       Communication Communication Communication: Expressive difficulties (slurred speech, reports not back to baseline)   Cognition Arousal/Alertness: Awake/alert Behavior During Therapy: WFL for tasks assessed/performed Overall Cognitive Status: Within Functional Limits for tasks assessed                     General Comments       Exercises       Shoulder Instructions      Home Living Family/patient expects to be discharged to:: Private residence Living Arrangements: Spouse/significant other;Other relatives (husband's family) Available Help at Discharge: Family;Available PRN/intermittently Type of Home: House Home Access: Level entry     Home Layout: Two level;Bed/bath upstairs Alternate Level  Stairs-Number of Steps: 12 Alternate Level Stairs-Rails: Right Bathroom Shower/Tub: Tub/shower unit Shower/tub characteristics: Engineer, building services: Standard     Home Equipment: Environmental consultant - 2 wheels          Prior Functioning/Environment Level of Independence: Needs assistance  Gait / Transfers  Assistance Needed: walks with a RW ADL's / Homemaking Assistance Needed: pt is independent with self care, husband does meal prep and housekeeping   Comments: pt reports not having had therapy following her CVAs, did not have insurance        OT Problem List: Decreased strength;Impaired balance (sitting and/or standing);Decreased knowledge of use of DME or AE;Impaired UE functional use;Decreased activity tolerance   OT Treatment/Interventions: Self-care/ADL training;Therapeutic exercise;Therapeutic activities;Patient/family education    OT Goals(Current goals can be found in the care plan section) Acute Rehab OT Goals Patient Stated Goal: to regain strength OT Goal Formulation: With patient Time For Goal Achievement: 03/07/16 Potential to Achieve Goals: Good ADL Goals Pt Will Transfer to Toilet: with modified independence;ambulating;regular height toilet Pt Will Perform Toileting - Clothing Manipulation and hygiene: with modified independence;sit to/from stand Pt Will Perform Tub/Shower Transfer: Tub transfer;with supervision;ambulating;shower seat Pt/caregiver will Perform Home Exercise Program: Increased strength;Both right and left upper extremity;With theraband;With theraputty;Independently;With written HEP provided  OT Frequency: Min 2X/week   Barriers to D/C:            Co-evaluation              End of Session Equipment Utilized During Treatment: Gait belt Nurse Communication: Mobility status  Activity Tolerance: Patient tolerated treatment well Patient left: in bed;with call bell/phone within reach   Time: 1024-1039 OT Time Calculation (min): 15 min Charges:  OT General Charges $OT Visit: 1 Procedure OT Evaluation $OT Eval Moderate Complexity: 1 Procedure G-Codes:    Evern Bio 02/29/2016, 10:54 AM  [phone]

## 2016-02-29 NOTE — Progress Notes (Signed)
  Echocardiogram 2D Echocardiogram has been performed.  Marisue Humblelexis N Logen Fowle 02/29/2016, 2:13 PM

## 2016-02-29 NOTE — Care Management Note (Signed)
Case Management Note  Patient Details  Name: Stephanie ShirtsMarva N Lloyd MRN: [id number] Date of Birth: 07/05/1970  Subjective/Objective:   Pt lives with spouse, uses walker for ambulation.  OT has evaluated and recommends OP OT.  SLP and PT evals pending.  Discussed neurorehab center with pt and husband, will fax order to center after all evals completed.  Pt listed as medicaid pending but pt and spouse both state medicaid has been approved, Artistfinancial counselor aware.                            Expected Discharge Plan:  Home/Self Care  Discharge planning Services  CM Consult  Post Acute Care Choice:  Durable Medical Equipment  DME Arranged:  Shower stool DME Agency:  [facility]  Status of Service:  In process, will continue to follow  Jamelle RushingMayo, Kaiah Hosea T, RN 02/29/2016, 1:48 PM

## 2016-02-29 NOTE — Evaluation (Signed)
Physical Therapy Evaluation Patient Details Name: Manfred ShirtsMarva N Wickey MRN: [id number] DOB: 12/04/1970 Today's Date: 02/29/2016   History of Present Illness  Pt admitted with slurred speech and generalized weakness. MRI + for acute infarct para median pons, R insular cortex. PMH: hemorrhagic CVA x 2, DM, HTN, HLD.  Clinical Impression  Pt presented supine in bed with HOB elevated, awake and willing to participate in therapy session. Prior to admission, pt reported that she used a RW to ambulate, independent with ADLs and required assistance from husband for IADLs. Pt currently at mod I level for bed mobility, min guard for transfers and supervision for ambulation with RW. Pt would continue to benefit from skilled physical therapy services at this time while admitted and after d/c to address her below listed limitations in order to improve her overall safety and independence with functional mobility.      Follow Up Recommendations Outpatient PT;Supervision - Intermittent    Equipment Recommendations  3in1 (PT)    Recommendations for Other Services       Precautions / Restrictions Precautions Precautions: Fall Restrictions Weight Bearing Restrictions: No      Mobility  Bed Mobility Overal bed mobility: Modified Independent                Transfers Overall transfer level: Needs assistance Equipment used: Rolling walker (2 wheeled) Transfers: Sit to/from Stand Sit to Stand: Min guard         General transfer comment: min guard for safety, no physical assist  Ambulation/Gait Ambulation/Gait assistance: Supervision Ambulation Distance (Feet): 100 Feet Assistive device: Rolling walker (2 wheeled) Gait Pattern/deviations: Step-through pattern;Decreased stride length Gait velocity: decreased Gait velocity interpretation: Below normal speed for age/gender General Gait Details: pt required frequent VC'ing to maintain safe distance from RW; no instability or LOB; supervision for  safety  Stairs            Wheelchair Mobility    Modified Rankin (Stroke Patients Only) Modified Rankin (Stroke Patients Only) Pre-Morbid Rankin Score: Moderately severe disability Modified Rankin: Moderately severe disability     Balance Overall balance assessment: Needs assistance Sitting-balance support: Feet supported Sitting balance-Leahy Scale: Fair     Standing balance support: During functional activity;Bilateral upper extremity supported Standing balance-Leahy Scale: Poor Standing balance comment: pt reliant on bilateral UEs on RW                             Pertinent Vitals/Pain Pain Assessment: No/denies pain    Home Living Family/patient expects to be discharged to:: Private residence Living Arrangements: Spouse/significant other;Other relatives (husband's family) Available Help at Discharge: Family;Available PRN/intermittently Type of Home: House Home Access: Level entry     Home Layout: Two level;Bed/bath upstairs Home Equipment: Walker - 2 wheels      Prior Function Level of Independence: Needs assistance   Gait / Transfers Assistance Needed: walks with a RW  ADL's / Homemaking Assistance Needed: pt is independent with self care, husband does meal prep and housekeeping  Comments: pt reports not having had therapy following her CVAs, did not have insurance     Hand Dominance   Dominant Hand: Right    Extremity/Trunk Assessment   Upper Extremity Assessment Upper Extremity Assessment: Defer to OT evaluation    Lower Extremity Assessment Lower Extremity Assessment: Overall WFL for tasks assessed       Communication   Communication: Other (comment) (slurred speech)  Cognition Arousal/Alertness: Awake/alert Behavior During [facility]: [facility] The[facility]  for tasks assessed/performed Overall Cognitive Status: Within Functional Limits for tasks assessed                      General Comments      Exercises     Assessment/Plan     PT Assessment Patient needs continued PT services  PT Problem List Decreased balance;Decreased mobility;Decreased coordination;Decreased knowledge of use of DME;Decreased safety awareness          PT Treatment Interventions DME instruction;Gait training;Stair training;Functional mobility training;Therapeutic activities;Therapeutic exercise;Balance training;Neuromuscular re-education;Patient/family education    PT Goals (Current goals can be found in the Care Plan section)  Acute Rehab PT Goals Patient Stated Goal: to regain strength PT Goal Formulation: With patient Time For Goal Achievement: 03/14/16 Potential to Achieve Goals: Good    Frequency Min 4X/week   Barriers to discharge        Co-evaluation               End of Session Equipment Utilized During Treatment: Gait belt Activity Tolerance: Patient tolerated treatment well Patient left: in bed;with call bell/phone within reach Nurse Communication: Mobility status         Time: 1610-9604 PT Time Calculation (min) (ACUTE ONLY): 12 min   Charges:   PT Evaluation $PT Eval Low Complexity: 1 Procedure     PT G CodesAlessandra Bevels Jasper Hanf 02/29/2016, 5:01 PM Deborah Chalk, PT, DPT [phone]

## 2016-02-29 NOTE — Progress Notes (Signed)
Family Medicine Teaching Service Daily Progress Note Intern Pager: [phone]  Patient name: Stephanie Lloyd Medical record number: [id number] Date of birth: 08/14/1970 Age: 46 y.o. Gender: female  Primary Care Provider: Donato SchultzYvonne R [name], DO Consultants: neurology, PT/OT, SLP Code Status: full  Pt Overview and Major Events to Date:  02/28/15- admitted to FPTS with acute ischemic stroke  Assessment and Plan: Stephanie ShirtsMarva N Lloyd is a 46 y.o. female presenting with generalized weakness with slurred speech. PMH is significant for h/o hemorrhagic CVA x2, IDDM, HTN.  #Acute ischemic stroke: MRI consistent with acute infarct affecting para median pons and small focus of acute infarct in the right insular cortex. No hemorrhaging or mass effect appreciated. Patient has not been taking aspirin for the past month. Has history of 2 prior hemorrhagic CVAs with residual right-sided weakness per chart review. Patient presenting with generalized weakness (in her legs) and slurring of speech since 1/30 am however 5/5 muscle strength all extremities. She has been taking her BP medications as prescribed. States she has not been taking pravastatin because her PCP discontinued this and is unsure why she does not take benazepril. History of medication noncompliance. This is likely the source of her recurrent stroke, however we will pursue stroke workup given new findings. No history of drug use and never smoker. Could be related to a coagulation disorder however this is unlikely. No personal or family history of blood clot. Denies history of palpitation but reports heart murmur. Doubt hypertensive emergency given normal range blood pressure on arrival to ED. BMP glucose 214.  --Neurology following, appreciate recommendations --Neuro checks q4h --Will allow permissive hypertension, but can start lowering slowly and cautiously with as needed labetalol per Dr. Anise SalvoKirpatrick (neuro) --Cardiac monitoring --Pulse ox q2h for  12 hours, then q4h --PT/OT/SLP evaluations pending --Echocardiogram pending - carotid dopplers --Will obtain EKG 12-lead --Troponin 1 --I&Os --Risk stratification labs (TSH and A1c) pending  --Atorvastatin 80 mg daily  #Hypertension: BP 147/86 on arrival to ED then went up to 218'/123's. Likely CVA drive and versus hypertensive emergency given near-normal BP on arrival to ED. Came down with hydralazine. Takes Coreg, hydralazine at home. Has benazepril however patient does not take this for unknown reasons. Given MRI findings for acute ischemic stroke, will allow for permissive hypertension and slowly lower per Dr. Amada JupiterKirkpatrick. BP this morning 151/96 --Cardiac monitoring --Holding home Coreg, hydralazine --labetalol IV 10 mg q2 hours prn SBP >180, DBP>100 - restarted patient on lisinopril 10 mg daily  #Insulin-dependent diabetes mellitus: CBG to 14 on admission. Last A1c 8.5 on 09/2015. Takes insulin NPH 70/30 15 units BID. Patient has a history of noncompliance with insulin use. This am fasting CBG 161 --Sensitive sliding scale with meal coverage --Holding home NPH --Monitor CBGs closely --A1c pending  #Hyperlipidemia: automatically needs high intensity statins given history of CVA --Lipid panel total cholesterol 170, LDL 99, HDL 56, triglycerides 74 --continue Atorvastatin 80 mg daily  FEN/GI:  --carb modified diet  Prophylaxis:  --Lovenox SQ  Disposition: pending PT/OT evaluation, possibly CIR  Subjective:  Stephanie Lloyd is feeling well this morning, about the same from when she came in. She reports weakness in her legs. Family members at bedside. She denies pain. She is worried about this happening again in future.  Objective: Temp:  [97.3 F (36.3 C)-98.8 F (37.1 C)] 97.3 F (36.3 C) (02/01 0400) Pulse Rate:  [62-87] 62 (02/01 0700) Resp:  [11-21] 14 (02/01 0700) BP: (126-218)/(71-123) 151/96 (02/01 0700) SpO2:  [96 %-100 %]  99 % (02/01 0700) Weight:  [188 lb  12.8 oz (85.6 kg)-196 lb (88.9 kg)] 188 lb 12.8 oz (85.6 kg) (01/31 1646) Physical Exam: General: well appearing lady laying in bed in NAD Cardiovascular: RRR no MRG Respiratory: CTA bilaterally no increased work of breathing Abdomen: soft, non-tender, non-distended, +BS Extremities: warm, well perfused, no edema or cyanosis Neuro: speech slurred. Minor facial droop on left. 5/5 strength in upper and lower extremities bilaterally.  Laboratory:  Recent Labs Lab 02/28/16 1218  WBC 10.9*  HGB 13.0  HCT 40.3  PLT 190    Recent Labs Lab 02/28/16 1218  NA 136  K 3.7  CL 105  CO2 24  BUN 17  CREATININE 1.46*  CALCIUM 9.2  GLUCOSE 214*   lipid panel total cholesterol 170, LDL 99, HDL 56, triglycerides 74  Imaging/Diagnostic Tests: Ct Head Wo Contrast  Result Date: 02/28/2016 CLINICAL DATA:  Possible stroke, symptoms began yesterday morning with slurred speech and weakness EXAM: CT HEAD WITHOUT CONTRAST TECHNIQUE: Contiguous axial images were obtained from the base of the skull through the vertex without intravenous contrast. COMPARISON:  CT brain scan of 10/28/2015 FINDINGS: Brain: The ventricular system remains prominent, as are the cortical sulci, consistent with atrophy advanced for age. The septum is midline in position. Somewhat prominent cerebellar folia also are noted. The fourth ventricle and basilar cisterns are unremarkable. No hemorrhage, mass effect or in acute infarction is noted. There is significant small vessel ischemic change throughout the periventricular white matter for age which may have advanced slightly in the interval as well. Vascular: No vascular abnormality is seen on this unenhanced study. Skull: On bone window images, no calvarial abnormality is seen. Sinuses/Orbits: There is no evidence of sinusitis. A probable retention cyst is noted anteriorly in the left maxillary sinus. Other: 1. Advanced diffuse atrophy for age with some progression of small vessel  ischemic change. No acute intracranial abnormality. 2. 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 Electronically Signed   By: Dwyane Dee M.D.   On: 02/28/2016 12:16   Mr Brain Wo Contrast  Result Date: 02/28/2016 CLINICAL DATA:  Generalize weakness beginning yesterday. Unable to get out of bit head. EXAM: MRI HEAD WITHOUT CONTRAST TECHNIQUE: Multiplanar, multiecho pulse sequences of the brain and surrounding structures were obtained without intravenous contrast. COMPARISON:  Head CT same day.  MRI 10/08/2015 FINDINGS: Brain: Diffusion imaging shows acute infarction within the left side of the pons. There is a punctate acute infarction in the deep insular cortex on the right. Chronic small-vessel ischemic changes affect the pons bilaterally. No focal cerebellar insult. Cerebral hemispheres show old hemorrhagic infarction in both thalami and throughout the deep and subcortical hemispheric white matter. No large vessel territory infarction. No mass lesions. No sign of acute hemorrhage. No hydrocephalus or extra-axial collection. Vascular: Major vessels at the base of the brain show flow. Skull and upper cervical spine: Negative Sinuses/Orbits: Clear/normal Other: None significant IMPRESSION: Acute infarction affecting the left para median pons. Small focus of acute infarction in the right insular cortex. Extensive old ischemic changes throughout the brainstem, thalami and cerebral hemispheric white matter. Many of the old infarctions demonstrate hemosiderin deposition. Electronically Signed   By: Paulina Fusi M.D.   On: 02/28/2016 14:16     Tillman Sers, DO 02/29/2016, 7:11 AM PGY-1, [facility] Intern pager: [phone], text pages welcome

## 2016-02-29 NOTE — Plan of Care (Signed)
Problem: Self-Care: Goal: Ability to participate in self-care as condition permits will improve Outcome: Progressing Patient able to ambulate to bathroom with standby assist. Weakness decreased from initial admission assessments.  Goal: Ability to communicate needs accurately will improve Outcome: Progressing Patient has some speech slurring/ dysarthria, but able to communicate effectively.   Problem: Nutrition: Goal: Risk of aspiration will decrease Outcome: Completed/Met Date Met: 02/29/16 Patient has diet orders and demonstrates no difficulty swallowing.  Problem: Tissue Perfusion: Goal: Complications of Ischemic Stroke will be minimized (choose ONE based on patient diagnosis) Outcome: Progressing NIH scale improved from previous shift. No indications that patient is having worsening symptoms. No headaches or N/V on PM shift.

## 2016-02-29 NOTE — Progress Notes (Signed)
  Spoke with Annie MainSharon Biby, Neurology NP about repeating Ms. Mcmonigle's carotid dopplers (done in August) and echo (done in September). Agreed that repeating carotids seems excessive. Echo scheduled for today already, will continue with that study. Appreciate her input.  Dolores PattyAngela Coulson Wehner, DO PGY-1, [facility] 02/29/2016 2:45 PM

## 2016-03-01 ENCOUNTER — Ambulatory Visit: Payer: Self-pay | Admitting: Family Medicine

## 2016-03-01 DIAGNOSIS — Z794 Long term (current) use of insulin: Secondary | ICD-10-CM

## 2016-03-01 LAB — BASIC METABOLIC PANEL
ANION GAP: 8 (ref 5–15)
BUN: 21 mg/dL — ABNORMAL HIGH (ref 6–20)
CO2: 24 mmol/L (ref 22–32)
Calcium: 8.9 mg/dL (ref 8.9–10.3)
Chloride: 107 mmol/L (ref 101–111)
Creatinine, Ser: 1.38 mg/dL — ABNORMAL HIGH (ref 0.44–1.00)
GFR calc non Af Amer: 45 mL/min — ABNORMAL LOW (ref >60)
GFR, EST AFRICAN AMERICAN: 52 mL/min — AB (ref >60)
GLUCOSE: 143 mg/dL — AB (ref 65–99)
POTASSIUM: 3.6 mmol/L (ref 3.5–5.1)
Sodium: 139 mmol/L (ref 135–145)

## 2016-03-01 LAB — TSH: TSH: 0.755 u[IU]/mL (ref 0.350–4.500)

## 2016-03-01 LAB — GLUCOSE, CAPILLARY
GLUCOSE-CAPILLARY: 135 mg/dL — AB (ref 65–99)
Glucose-Capillary: 137 mg/dL — ABNORMAL HIGH (ref 65–99)

## 2016-03-01 LAB — HEMOGLOBIN A1C
HEMOGLOBIN A1C: 6.9 % — AB (ref 4.8–5.6)
MEAN PLASMA GLUCOSE: 151 mg/dL

## 2016-03-01 MED ORDER — LISINOPRIL 5 MG PO TABS: 5.0000 mg | ORAL_TABLET | Freq: Every day | ORAL | 3 refills | Status: DC

## 2016-03-01 MED ORDER — ASPIRIN 325 MG PO TBEC: 325.0000 mg | DELAYED_RELEASE_TABLET | Freq: Every day | ORAL | 11 refills | Status: AC

## 2016-03-01 MED ORDER — LISINOPRIL 5 MG PO TABS: 5.0000 mg | ORAL_TABLET | Freq: Every day | ORAL | Status: DC

## 2016-03-01 MED ORDER — ATORVASTATIN CALCIUM 80 MG PO TABS: 80.0000 mg | ORAL_TABLET | Freq: Every day | ORAL | 3 refills | Status: DC

## 2016-03-01 NOTE — Discharge Summary (Signed)
[facility] Discharge Summary  Patient name: Stephanie [facility] Medical record number: [id number] Date of birth: [birth year] Age: 46 y.o. Gender: female Date of Admission: 02/28/2016  Date of Discharge: 03/01/16 Admitting Physician: Leeanne Rio, MD  Primary Care Provider: Ann Held, DO Consultants: neurology, PT/OT  Indication for Hospitalization: stroke  Discharge Diagnoses/Problem List:  Acute ischemic stroke Hypertensive urgency Syncope Diabetes mellitus Hypertension Hyperlipidemia History of hemorrhagic stroke  Disposition: home  Discharge Condition: stable, improved  Discharge Exam: see progress note from day of discharge  Brief [facility] Course:   Stephanie Lloyd is a 46 year old with PMH of h/o hemorrhagic CVA x2, IDDM, and HTN who presented to [facility] on 02/28/16 with slurred speech and lower extremity weakness that began the day before. She was found to have an acute ischemic stroke. Stroke team was notified and followed throughout her stay. She presented outside the window for TPA use. The cause of the stroke was thought to be poor adherence to medications, secondary to issues affording medication and lack of insurance. Patient was noted to be normotensive upon arrival to [facility] but subsequently had BP readings of 220/120's, which came down with hydralazine. Neurology recommended slowly lowering blood pressure to normal with labetalol. She was then started on daily lisinopril for long term blood pressure control. Her home Coreg and Hydralazine were discontinued at time of discharge. PT and OT evaluated the patient and recommended outpatient follow up. Risk stratification labs including lipid panel, A1C, and TSH were within normal ranges. Her A1C had improved to 6.9 with her home insulin regimen. Echocardiogram was performed, see results below. EKG demonstrated T wave inversion in leads V4-V6 that was changed from prior study in October  2017. Cardiology was called and stated these changes can be seen after a stroke and did not warrant further work up. Patient also had a negative troponin. She was discharged home on 03/01/16 with outpatient follow up for PT, OT, neurology and her PCP.  Issues for Follow Up:  1. Blood Pressure -  Blood pressure gradually reduced to normal during hospitalization. Patient discharged with 5 mg lisinopril daily regimen. Please follow up blood pressure and titrate medications as appropriate. Coreg discontinued due to low HR and hydralazine also stopped to keep BP in normotensive range 2. Follow up with PT and OT as outpatient  Significant Procedures: none  Significant Labs and Imaging:   Recent Labs Lab 02/28/16 1218  WBC 10.9*  HGB 13.0  HCT 40.3  PLT 190    Recent Labs Lab 02/28/16 1218 03/01/16 0240  NA 136 139  K 3.7 3.6  CL 105 107  CO2 24 24  GLUCOSE 214* 143*  BUN 17 21*  CREATININE 1.46* 1.38*  CALCIUM 9.2 8.9   lipid panel total cholesterol 170, LDL 99, HDL 56, triglycerides 74 A1C 6.9 Trop < 0.03 TSH 0.755  Echo Study Conclusions  - Left ventricle: The cavity size was normal. Wall thickness was increased in a pattern of moderate LVH. Systolic function was normal. The estimated ejection fraction was in the range of 60% to 65%. Wall motion was normal; there were no regional wall motion abnormalities. Doppler parameters are consistent with abnormal left ventricular relaxation (grade 1 diastolic dysfunction). - Aortic valve: There was trivial regurgitation. - Mitral valve: There was mild regurgitation.  Impressions:  - Normal LV systolic function; grade 1 diastolic dysfunction; moderate LVH; trace AI; mild MR.  Ct Head Wo Contrast  Result Date: 02/28/2016  CLINICAL DATA:  Possible stroke, symptoms began yesterday morning with slurred speech and weakness EXAM: CT HEAD WITHOUT CONTRAST TECHNIQUE: Contiguous axial images were obtained from the base  of the skull through the vertex without intravenous contrast. COMPARISON:  CT brain scan of 10/28/2015 FINDINGS: Brain: The ventricular system remains prominent, as are the cortical sulci, consistent with atrophy advanced for age. The septum is midline in position. Somewhat prominent cerebellar folia also are noted. The fourth ventricle and basilar cisterns are unremarkable. No hemorrhage, mass effect or in acute infarction is noted. There is significant small vessel ischemic change throughout the periventricular white matter for age which may have advanced slightly in the interval as well. Vascular: No vascular abnormality is seen on this unenhanced study. Skull: On bone window images, no calvarial abnormality is seen. Sinuses/Orbits: There is no evidence of sinusitis. A probable retention cyst is noted anteriorly in the left maxillary sinus. Other: 1. Advanced diffuse atrophy for age with some progression of small vessel ischemic change. No acute intracranial abnormality. 2. 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 Electronically Signed   By: [facility] M.D.   On: 02/28/2016 12:16   Mr Brain Wo Contrast  Result Date: 02/28/2016 CLINICAL DATA:  Generalize weakness beginning yesterday. Unable to get out of bit head. EXAM: MRI HEAD WITHOUT CONTRAST TECHNIQUE: Multiplanar, multiecho pulse sequences of the brain and surrounding structures were obtained without intravenous contrast. COMPARISON:  Head CT same day.  MRI 10/08/2015 FINDINGS: Brain: Diffusion imaging shows acute infarction within the left side of the pons. There is a punctate acute infarction in the deep insular cortex on the right. Chronic small-vessel ischemic changes affect the pons bilaterally. No focal cerebellar insult. Cerebral hemispheres show old hemorrhagic infarction in both thalami and throughout the deep and subcortical hemispheric white matter. No large vessel territory infarction. No mass lesions. No sign of acute hemorrhage.  No hydrocephalus or extra-axial collection. Vascular: Major vessels at the base of the brain show flow. Skull and upper cervical spine: Negative Sinuses/Orbits: Clear/normal Other: None significant IMPRESSION: Acute infarction affecting the left para median pons. Small focus of acute infarction in the right insular cortex. Extensive old ischemic changes throughout the brainstem, thalami and cerebral hemispheric white matter. Many of the old infarctions demonstrate hemosiderin deposition. Electronically Signed   By: Nelson Chimes M.D.   On: 02/28/2016 14:16    Results/Tests Pending at Time of Discharge: none  Discharge Medications:  Allergies as of 03/01/2016      Reactions   Metformin And Related Diarrhea      Medication List    STOP taking these medications   benazepril 40 MG tablet Commonly known as:  LOTENSIN   carvedilol 25 MG tablet Commonly known as:  COREG   hydrALAZINE 50 MG tablet Commonly known as:  APRESOLINE   pravastatin 40 MG tablet Commonly known as:  PRAVACHOL     TAKE these medications   ACCU-CHEK FASTCLIX LANCETS Misc Check blood sugar twice daily. Dx E:11.9   ACCU-CHEK GUIDE w/Device Kit 1 Device by Does not apply route as directed. Check blood sugar daily   aspirin 325 MG EC tablet Take 1 tablet (325 mg total) by mouth daily. What changed:  medication strength  how much to take   atorvastatin 80 MG tablet Commonly known as:  LIPITOR Take 1 tablet (80 mg total) by mouth daily at 6 PM.   glucose blood test strip Commonly known as:  ACCU-CHEK GUIDE Check blood sugar twice daily. Dx  E:11.9   insulin NPH-regular Human (70-30) 100 UNIT/ML injection Commonly known as:  NOVOLIN 70/30 RELION Inject 15 Units into the skin 2 (two) times daily with a meal. What changed:  how much to take   lisinopril 5 MG tablet Commonly known as:  PRINIVIL,ZESTRIL Take 1 tablet (5 mg total) by mouth daily.   UNABLE TO FIND Outpatient physical therapy   Diagnosis:  stroke, physical deconditioning       Discharge Instructions: Please refer to Patient Instructions section of EMR for full details.  Patient was counseled important signs and symptoms that should prompt return to medical care, changes in medications, dietary instructions, activity restrictions, and follow up appointments.   Follow-Up Appointments: Follow-up Information    Xu,Jindong, MD. Schedule an appointment as soon as possible for a visit in 6 week(s).   Specialty:  Neurology Contact information: [address] [place] [address] [place], DO. Schedule an appointment as soon as possible for a visit in 3 day(s).   Specialty:  Family Medicine Why:  follow up for hospitalization, blood pressure control Contact information: [address] [place] [address] [phone]           Angela C Riccio, DO 03/02/2016, 2:32 PM PGY-1, [place]

## 2016-03-01 NOTE — Progress Notes (Signed)
Physical Therapy Treatment Patient Details Name: Stephanie ShirtsMarva N Lloyd MRN: [id number] DOB: 02/03/1970 Today's Date: 03/01/2016    History of Present Illness Pt admitted with slurred speech and generalized weakness. MRI + for acute infarct para median pons, R insular cortex. PMH: hemorrhagic CVA x 2, DM, HTN, HLD.    PT Comments    Pt making great progress this session. She continues to have L knee hyperextension and snapback in stance phase. Pt would continue to benefit from skilled physical therapy services at this time while admitted and after d/c to address her limitations in order to improve her overall safety and independence with functional mobility.    Follow Up Recommendations  Outpatient PT;Supervision - Intermittent     Equipment Recommendations  3in1 (PT)    Recommendations for Other Services       Precautions / Restrictions Precautions Precautions: Fall Restrictions Weight Bearing Restrictions: No    Mobility  Bed Mobility               General bed mobility comments: pt sitting EOB when PT entered room  Transfers Overall transfer level: Needs assistance Equipment used: Rolling walker (2 wheeled) Transfers: Sit to/from Stand Sit to Stand: Min guard         General transfer comment: min guard for safety, no physical assist  Ambulation/Gait Ambulation/Gait assistance: Supervision Ambulation Distance (Feet): 200 Feet Assistive device: Rolling walker (2 wheeled) Gait Pattern/deviations: Step-through pattern;Decreased stride length (L knee hyperextension in stance phase) Gait velocity: decreased Gait velocity interpretation: Below normal speed for age/gender General Gait Details: pt required frequent VC'ing to maintain safe distance from RW; no instability or LOB; supervision for safety   Stairs            Wheelchair Mobility    Modified Rankin (Stroke Patients Only)       Balance Overall balance assessment: Needs assistance Sitting-balance  support: Feet supported Sitting balance-Leahy Scale: Fair     Standing balance support: During functional activity;No upper extremity supported Standing balance-Leahy Scale: Fair                      Cognition Arousal/Alertness: Awake/alert Behavior During Therapy: WFL for tasks assessed/performed Overall Cognitive Status: Within Functional Limits for tasks assessed                      Exercises General Exercises - Lower Extremity Long Arc Quad: AROM;Strengthening;Both;20 reps;Seated Hip Flexion/Marching: AROM;Strengthening;Both;20 reps;Seated;Standing Mini-Sqauts: AROM;Strengthening;Both;10 reps;Standing    General Comments        Pertinent Vitals/Pain Pain Assessment: No/denies pain    Home Living                      Prior Function            PT Goals (current goals can now be found in the care plan section) Acute Rehab PT Goals Patient Stated Goal: to regain strength PT Goal Formulation: With patient Time For Goal Achievement: 03/14/16 Potential to Achieve Goals: Good Progress towards PT goals: Progressing toward goals    Frequency    Min 4X/week      PT Plan Current plan remains appropriate    Co-evaluation             End of Session Equipment Utilized During Treatment: Gait belt Activity Tolerance: Patient tolerated treatment well Patient left: in bed;with call bell/phone within reach     Time: 1445-1455 PT Time Calculation (min) (ACUTE ONLY): 10  min  Charges:  $Gait Training: 8-22 mins                    G Codes:      Alessandra Bevels Avryl Roehm 2016/03/03, 3:09 PM Deborah Chalk, PT, DPT [phone]

## 2016-03-01 NOTE — Progress Notes (Signed)
Patient being discharged home. Patient/husband given discharge instructions. Patient/husband educated on medication. Educated on importance of making/going to follow up appts. This nurse went over signs/symptoms of stroke and when to call 911. Patient/husband verbalized understanding.

## 2016-03-01 NOTE — Plan of Care (Signed)
Problem: Self-Care: Goal: Ability to participate in self-care as condition permits will improve Outcome: Progressing Patient able to walk to bathroom and take care of self. Some weakness and slightly unsteady, but overal patient progressing well. PT worked with patient and offered referrals for discharge.

## 2016-03-01 NOTE — Discharge Instructions (Signed)
Ischemic Stroke An ischemic stroke is the sudden death of brain tissue. Blood carries oxygen to all areas of the body. This type of stroke happens when your blood does not flow to your brain like normal. Your brain cannot get the oxygen it needs. This is an emergency. It must be treated right away. Symptoms of a stroke usually happen all of a sudden. You may notice them when you wake up. They can include:  Weakness or loss of feeling in your face, arm, or leg. This often happens on one side of the body.  Trouble walking.  Trouble moving your arms or legs.  Loss of balance or coordination.  Feeling confused.  Trouble talking or understanding what people are saying.  Slurred speech.  Trouble seeing.  Seeing two of one object (double vision).  Feeling dizzy.  Feeling sick to your stomach (nauseous) and throwing up (vomiting).  A very bad headache for no reason. Get help as soon as any of these problems start. This is important. Some treatments work better if they are given right away. These include:  Aspirin.  Medicines to control blood pressure.  A shot (injection) of medicine to break up the blood clot.  Treatments given in the blood vessel (artery) to take out the clot or break it up. Other treatments may include:  Oxygen.  Fluids given through an IV tube.  Medicines to thin out your blood.  Procedures to help your blood flow better. What increases the risk? Certain things may make you more likely to have a stroke. Some of these are things that you can change, such as:  Being very overweight (obesity).  Smoking.  Taking birth control pills.  Not being active.  Drinking too much alcohol.  Using drugs. Other risk factors include:  High blood pressure.  High cholesterol.  Diabetes.  Heart disease.  Being African American, Native American, Hispanic, or [place] Native.  Being over age 60.  Family history of stroke.  Having had blood clots, stroke,  or warning stroke (transient ischemic attack, TIA) in the past.  Sickle cell disease.  Being a woman with a history of high blood pressure in pregnancy (preeclampsia).  Migraine headache.  Sleep apnea.  Having an irregular heartbeat (atrial fibrillation).  Long-term (chronic) diseases that cause soreness and swelling (inflammation).  Disorders that affect how your blood clots. Follow these instructions at home: Medicines  Take over-the-counter and prescription medicines only as told by your doctor.  If you were told to take aspirin or another medicine to thin your blood, take it exactly as told by your doctor.  Taking too much of the medicine can cause bleeding.  If you do not take enough, it may not work as well.  Know the side effects of your medicines. If you are taking a blood thinner, make sure you:  Hold pressure over any cuts for longer than usual.  Tell your dentist and other doctors that you take this medicine.  Avoid activities that may cause damage or injury to your body. Eating and drinking  Follow instructions from your doctor about what you cannot eat or drink.  Eat healthy foods.  If you have trouble with swallowing, do these things to avoid choking:  Take small bites when eating.  Eat foods that are soft or pureed. Safety  Follow instructions from your health care team about physical activity.  Use a walker or cane as told by your doctor.  Keep your home safe so you do not fall. This   may include:  Having experts look at your home to make sure it is safe.  Putting grab bars in the bedroom and bathroom.  Using raised toilets.  Putting a seat in the shower. General instructions  Do not use any tobacco products.  Examples of these are cigarettes, chewing tobacco, and e-cigarettes.  If you need help quitting, ask your doctor.  Limit how much alcohol you drink. This means no more than 1 drink a day for nonpregnant women and 2 drinks a day  for men. One drink equals 12 oz of beer, 5 oz of wine, or 1 oz of hard liquor.  If you need help to stop using drugs or alcohol, ask your doctor to refer you to a program or specialist.  Stay active. Exercise as told by your doctor.  Keep all follow-up visits as told by your doctor. This is important. Get help right away if:  You suddenly:  Have weakness or loss of feeling in your face, arm, or leg.  Feel confused.  Have trouble talking or understanding what people are saying.  Have trouble seeing.  Have trouble walking.  Have trouble moving your arms or legs.  Feel dizzy.  Lose your balance or coordination.  Have a very bad headache and you do not know why.  You pass out (lose consciousness) or almost pass out.  You have jerky movements that you cannot control (seizure). These symptoms may be an emergency. Do not wait to see if the symptoms will go away. Get medical help right away. Call your local emergency services (911 in the [place] Do not drive yourself to the hospital.  This information is not intended to replace advice given to you by your health care provider. Make sure you discuss any questions you have with your health care provider. Document Released: 01/03/2011 Document Revised: 06/27/2015 Document Reviewed: 04/12/2015 Elsevier Interactive Patient Education  2017 Elsevier Inc.  

## 2016-03-01 NOTE — Progress Notes (Signed)
SLP Cancellation Note  Patient Details Name: Stephanie [facility] MRN: [id number] DOB: 08/20/1970   Cancelled treatment:       Reason Eval/Treat Not Completed: Other (comment) Pt screened at bedside. Pt demonstrates mild dysarthria, d/c pending today and case manager already setting up outpatient services. Pt can complete full eval at outpatient setting, discussed with her, pt and family are fine with deferring eval to OP.   Harlon DittyBonnie Kieran Arreguin, MA CCC-SLP [phone]  Claudine MoutonDeBlois, Stephanie [facility] [name] 03/01/2016, 11:01 AM

## 2016-03-01 NOTE — Care Management Note (Signed)
Case Management Note  Patient Details  Name: Stephanie [facility] MRN: [id number] Date of Birth: 04/05/1970  Subjective/Objective:   Pt to d/c home with husband, [facility]/ST.  Referral signed by attending and [facility].  Pt states she has been approved for Medicaid, has not received card yet.                            Expected Discharge Plan:  Home/Self Care  Discharge planning Services  CM Consult  Post Acute Care Choice:  Durable Medical Equipment  DME Arranged:  Shower stool DME Agency:  [facility]  Status of Service:  Completed, signed off  Magdalene RiverMayo, Demarqus Jocson T, [place]RN 03/01/2016, 1:55 PM

## 2016-03-01 NOTE — Progress Notes (Signed)
Transitions of Care Pharmacy Note  Plan:  Educated on change from benazepril to lisinopril, change from pravastatin to atorvastatin, d/c carvedilol, change ASA 81 mg to 325 mg daily Addressed concerns regarding cost of medicaitons Recommend close follow up on adherence and BP control  --------------------------------------------- Manfred ShirtsMarva N Glasscock is an 46 y.o. female who presents with a chief complaint new ischemic infarct. In anticipation of discharge, pharmacy has reviewed this patient's prior to admission medication history, as well as [facility]  Current medication indications, dosing, frequency, and notable side effects reviewed with patient and family. patient and family verbalized understanding of current inpatient medication regimen and is aware that the After Visit Summary when presented, will represent the most accurate medication list at discharge.   Manfred ShirtsMarva N Bump expressed concerns regarding cost but states that she now has Medicaid and cost should be OK. Patient endorses non adherence to her home meds because of this previous barrier. Educated on all indications, dosing schedules, ADE, and use of pill box. Patient states that she is committed to being more adherent and thinks she will be able to afford meds now.  Assessment: Understanding of regimen: fair Understanding of indications: fair Potential of compliance: good Barriers to Obtaining Medications: Yes - cost, has medicaid now per patient  Patient instructed to contact inpatient pharmacy team with further questions or concerns if needed.    Time spent preparing for discharge counseling: 15 mins Time spent counseling patient:  15 mins   Thank you for allowing pharmacy to be a part of this patient's care.  Allena Katzaroline E Welles, Pharm.D. PGY1 Pharmacy Resident 2/2/201811:42 AM Pager [phone]

## 2016-03-01 NOTE — Progress Notes (Signed)
Family Medicine Teaching Service Daily Progress Note Intern Pager: [phone]  Patient name: Stephanie Lloyd Medical record number: [id number] Date of birth: 08/29/1970 Age: 46 y.o. Gender: female  Primary Care Provider: Donato Schultz, DO Consultants: neurology, PT/OT Code Status: full  Pt Overview and Major Events to Date:  02/28/15- admitted to FPTS with acute ischemic stroke  Assessment and Plan: Stephanie Lloyd is a 46 y.o. female presenting with generalized weakness with slurred speech. PMH is significant for h/o hemorrhagic CVA x2, IDDM, HTN.  #Acute ischemic stroke: MRI consistent with acute infarct affecting para median pons and small focus of acute infarct in the right insular cortex. No hemorrhaging or mass effect appreciated. Patient has not been taking aspirin for the past month. Has history of 2 prior hemorrhagic CVAs with residual right-sided weakness per chart review. Patient presenting with generalized weakness (in her legs) and slurring of speech since 1/30 am however 5/5 muscle strength all extremities. She has been taking her BP medications as prescribed. States she has not been taking pravastatin because her PCP discontinued this and is unsure why she does not take benazepril. History of medication noncompliance. This is likely the source of her recurrent stroke, however we will pursue stroke workup given new findings. --Neurology signed off -- continue ASA 325 daily on discharge --Neuro checks q4h --Cardiac monitoring --Pulse ox q2h for 12 hours, then q4h --PT/OT recommending outpatient PT/OT --Echocardiogram with EF 60-65%, G1DD --repeat EKG with bradycardia, LVH, T wave inversion in V4-6. Plan to contact cardiology regarding these EKG changes --TSH pending --Atorvastatin 80 mg daily  #Hypertension: BP 147/86 on arrival to ED then went up to 218'/123's. Likely CVA drive and versus hypertensive emergency given near-normal BP on arrival to ED. Came down with  hydralazine. Takes Coreg, hydralazine at home. --Cardiac monitoring --Holding home Coreg, hydralazine --labetalol IV 10 mg q2 hours prn SBP >180, DBP>100 --continue lisinopril at reduced dose of 5mg  daily  #Insulin-dependent diabetes mellitus: CBG to 214 on admission. Last A1c 8.5 on 09/2015. Takes insulin NPH 70/30 15 units BID. Patient has a history of noncompliance with insulin use. This am fasting CBG 137 --Sensitive sliding scale with meal coverage --Holding home NPH --Monitor CBGs closely --A1c improved to 6.9   #Hyperlipidemia: automatically needs high intensity statins given history of CVA --continue Atorvastatin 80 mg daily  FEN/GI:  --carb modified diet  Prophylaxis:  --Lovenox SQ  Disposition: home with outpatient PT/OT  Subjective:  Stephanie Lloyd feels well today, she is amenable to going home today. She denies weakness, chest pain, SOB.   Objective: Temp:  [97.2 F (36.2 C)-98.1 F (36.7 C)] 97.7 F (36.5 C) (02/02 0747) Pulse Rate:  [58-84] 64 (02/02 0747) Resp:  [14-24] 17 (02/02 0747) BP: (123-176)/(72-102) 139/82 (02/02 0747) SpO2:  [99 %-100 %] 100 % (02/02 0747) Physical Exam: General: well appearing lady laying in bed in NAD Cardiovascular: RRR no MRG Respiratory: CTA bilaterally no increased work of breathing Abdomen: soft, non-tender, non-distended, +BS Extremities: warm, well perfused, no edema or cyanosis Neuro: speech improved, 5/5 strength in upper and lower extremities bilaterally.  Laboratory:  Recent Labs Lab 02/28/16 1218  WBC 10.9*  HGB 13.0  HCT 40.3  PLT 190    Recent Labs Lab 02/28/16 1218 03/01/16 0240  NA 136 139  K 3.7 3.6  CL 105 107  CO2 24 24  BUN 17 21*  CREATININE 1.46* 1.38*  CALCIUM 9.2 8.9  GLUCOSE 214* 143*   lipid panel total  cholesterol 170, LDL 99, HDL 56, triglycerides 74 A1C 6.9 Trop < 0.03  Imaging/Diagnostic Tests: Ct Head Wo Contrast  Result Date: 02/28/2016 CLINICAL DATA:  Possible  stroke, symptoms began yesterday morning with slurred speech and weakness EXAM: CT HEAD WITHOUT CONTRAST TECHNIQUE: Contiguous axial images were obtained from the base of the skull through the vertex without intravenous contrast. COMPARISON:  CT brain scan of 10/28/2015 FINDINGS: Brain: The ventricular system remains prominent, as are the cortical sulci, consistent with atrophy advanced for age. The septum is midline in position. Somewhat prominent cerebellar folia also are noted. The fourth ventricle and basilar cisterns are unremarkable. No hemorrhage, mass effect or in acute infarction is noted. There is significant small vessel ischemic change throughout the periventricular white matter for age which may have advanced slightly in the interval as well. Vascular: No vascular abnormality is seen on this unenhanced study. Skull: On bone window images, no calvarial abnormality is seen. Sinuses/Orbits: There is no evidence of sinusitis. A probable retention cyst is noted anteriorly in the left maxillary sinus. Other: 1. Advanced diffuse atrophy for age with some progression of small vessel ischemic change. No acute intracranial abnormality. 2. 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 Electronically Signed   By: Dwyane DeePaul  Barry M.D.   On: 02/28/2016 12:16   Mr Brain Wo Contrast  Result Date: 02/28/2016 CLINICAL DATA:  Generalize weakness beginning yesterday. Unable to get out of bit head. EXAM: MRI HEAD WITHOUT CONTRAST TECHNIQUE: Multiplanar, multiecho pulse sequences of the brain and surrounding structures were obtained without intravenous contrast. COMPARISON:  Head CT same day.  MRI 10/08/2015 FINDINGS: Brain: Diffusion imaging shows acute infarction within the left side of the pons. There is a punctate acute infarction in the deep insular cortex on the right. Chronic small-vessel ischemic changes affect the pons bilaterally. No focal cerebellar insult. Cerebral hemispheres show old hemorrhagic  infarction in both thalami and throughout the deep and subcortical hemispheric white matter. No large vessel territory infarction. No mass lesions. No sign of acute hemorrhage. No hydrocephalus or extra-axial collection. Vascular: Major vessels at the base of the brain show flow. Skull and upper cervical spine: Negative Sinuses/Orbits: Clear/normal Other: None significant IMPRESSION: Acute infarction affecting the left para median pons. Small focus of acute infarction in the right insular cortex. Extensive old ischemic changes throughout the brainstem, thalami and cerebral hemispheric white matter. Many of the old infarctions demonstrate hemosiderin deposition. Electronically Signed   By: Paulina FusiMark  Shogry M.D.   On: 02/28/2016 14:16   Echocardiogram Study Conclusions  - Left ventricle: The cavity size was normal. Wall thickness was   increased in a pattern of moderate LVH. Systolic function was   normal. The estimated ejection fraction was in the range of 60%   to 65%. Wall motion was normal; there were no regional wall   motion abnormalities. Doppler parameters are consistent with   abnormal left ventricular relaxation (grade 1 diastolic   dysfunction). - Aortic valve: There was trivial regurgitation. - Mitral valve: There was mild regurgitation.  Impressions:  - Normal LV systolic function; grade 1 diastolic dysfunction;   moderate LVH; trace AI; mild MR.  Tillman SersAngela C Rosilyn Coachman, DO 03/01/2016, 8:04 AM PGY-1, [facility] FPTS Intern pager: [phone], text pages welcome

## 2016-03-04 ENCOUNTER — Telehealth: Payer: Self-pay

## 2016-03-04 NOTE — Telephone Encounter (Addendum)
03/04/16  Transition Care Management Follow-up Telephone Call  ADMISSION DATE: 02/28/16  DISCHARGE DATE: 03/01/16  How have you been since you were released from the hospital? Patien states she has had a lot of Fatigue but no other symptoms.     Do you understand why you were in the hospital? YES   Do you understand the discharge instrcutions? Yes  Items Reviewed:  Medications reviewed:  Medications reviewed with patient.  Allergies reviewed: Patient states she is only Allergic to Metformin which causes her to have stomach problems  .  Dietary changes reviewed:Low Sodium Heart Healthy    Referrals reviewed: Appointment scheduled for Wednesday the 7th at 1:00 with Esperanza RichtersEdward Saguier, Patient states she will schedule with Neurology.   Functional Questionnaire:   Activities of Daily Living (ADLs):  No help needed with dressing or bathing. Uses walker to help with Ambulation   Any transportation issues/concerns?: No, patient has transportation.    Any patient concerns? None other than fatigue.Marland Kitchen.  Confirmed importance and date/time of follow-up visits scheduled: Yes   Confirmed with patient if condition begins to worsen call PCP or go to the ER. Yes, patient agreed.    Patient was given the Call-a-Nurse line [phone]: Yes

## 2016-03-06 ENCOUNTER — Encounter: Payer: Self-pay | Admitting: Medical

## 2016-03-06 ENCOUNTER — Ambulatory Visit (INDEPENDENT_AMBULATORY_CARE_PROVIDER_SITE_OTHER): Payer: Medicaid Other | Admitting: Medical

## 2016-03-06 ENCOUNTER — Telehealth: Payer: Self-pay | Admitting: Medical

## 2016-03-06 VITALS — BP 130/70 | HR 65 | Temp 97.4°F | Resp 16 | Ht 70.0 in | Wt 191.4 lb

## 2016-03-06 DIAGNOSIS — Z8673 Personal history of transient ischemic attack (TIA), and cerebral infarction without residual deficits: Secondary | ICD-10-CM

## 2016-03-06 DIAGNOSIS — R82998 Other abnormal findings in urine: Secondary | ICD-10-CM

## 2016-03-06 DIAGNOSIS — R5383 Other fatigue: Secondary | ICD-10-CM

## 2016-03-06 DIAGNOSIS — R8299 Other abnormal findings in urine: Secondary | ICD-10-CM | POA: Diagnosis not present

## 2016-03-06 DIAGNOSIS — R319 Hematuria, unspecified: Secondary | ICD-10-CM | POA: Diagnosis not present

## 2016-03-06 DIAGNOSIS — I10 Essential (primary) hypertension: Secondary | ICD-10-CM | POA: Diagnosis not present

## 2016-03-06 DIAGNOSIS — IMO0002 Reserved for concepts with insufficient information to code with codable children: Secondary | ICD-10-CM

## 2016-03-06 DIAGNOSIS — E1165 Type 2 diabetes mellitus with hyperglycemia: Secondary | ICD-10-CM

## 2016-03-06 DIAGNOSIS — E1151 Type 2 diabetes mellitus with diabetic peripheral angiopathy without gangrene: Secondary | ICD-10-CM | POA: Diagnosis not present

## 2016-03-06 DIAGNOSIS — E785 Hyperlipidemia, unspecified: Secondary | ICD-10-CM

## 2016-03-06 DIAGNOSIS — D649 Anemia, unspecified: Secondary | ICD-10-CM

## 2016-03-06 LAB — CBC WITH DIFFERENTIAL/PLATELET
BASOS ABS: 0.1 10*3/uL (ref 0.0–0.1)
Basophils Relative: 0.8 % (ref 0.0–3.0)
EOS ABS: 0.3 10*3/uL (ref 0.0–0.7)
Eosinophils Relative: 3.8 % (ref 0.0–5.0)
HCT: 35.9 % — ABNORMAL LOW (ref 36.0–46.0)
Hemoglobin: 11.6 g/dL — ABNORMAL LOW (ref 12.0–15.0)
LYMPHS ABS: 2.6 10*3/uL (ref 0.7–4.0)
Lymphocytes Relative: 31.7 % (ref 12.0–46.0)
MCHC: 32.2 g/dL (ref 30.0–36.0)
MCV: 83.4 fl (ref 78.0–100.0)
MONO ABS: 0.5 10*3/uL (ref 0.1–1.0)
Monocytes Relative: 6.3 % (ref 3.0–12.0)
NEUTROS PCT: 57.4 % (ref 43.0–77.0)
Neutro Abs: 4.6 10*3/uL (ref 1.4–7.7)
PLATELETS: 246 10*3/uL (ref 150.0–400.0)
RBC: 4.31 Mil/uL (ref 3.87–5.11)
RDW: 14.2 % (ref 11.5–15.5)
WBC: 8.1 10*3/uL (ref 4.0–10.5)

## 2016-03-06 LAB — POC URINALSYSI DIPSTICK (AUTOMATED)
BILIRUBIN UA: NEGATIVE
Glucose, UA: NEGATIVE
Ketones, UA: NEGATIVE
NITRITE UA: NEGATIVE
PH UA: 6
Spec Grav, UA: >=1.03
Urobilinogen, UA: 1

## 2016-03-06 NOTE — Telephone Encounter (Signed)
Future labs placed. Cbc in one week. ibof one week. Anemia panel from labs drawn yesterday.

## 2016-03-06 NOTE — Progress Notes (Signed)
Pre visit review using our clinic review tool, if applicable. No additional management support is needed unless otherwise documented below in the visit note/SLS  

## 2016-03-06 NOTE — Addendum Note (Signed)
Addended by: Gwenevere AbbotSAGUIER, Gildardo Tickner M on: 03/06/2016 05:09 PM   Modules accepted: Level of Service

## 2016-03-06 NOTE — Patient Instructions (Addendum)
For your history of stroke continue medication recommended on discharge.   Please attend the PT and OT appointments.  I will put in neurologist referral.(Call Victorino DikeJennifer here if no update by Friday on referral)  For fatigue post hospitalization will get cbc, cmp and ua.  Rx bp cuff to check daily. If bp increasing above 150/90 please let us know.  Stay on current low dose lisinopril.   With your history of both hermorrhagic and ischemic strokes be very careful and if you have worse/new symptoms than current baseline function then ED evaluation.  Continue current diabetic meds.  Follow up with pcp in 3-4 weeks.

## 2016-03-06 NOTE — Progress Notes (Addendum)
Subjective:    Patient ID: Stephanie Lloyd, female    DOB: 1970/06/28, 46 y.o.   MRN: [id number]  HPI  Pt in for follow up. Pt states since discharge from hospital has just felt genralized fatigue. Pt has history of  Hemorrhagic cva x 2. Recent ischemic on last admission by records and chart review.  Pt states bp was very high when she went to ED. Bofore went to ED was 220/120. Pt was evaluated at ED. They admitted her. Pt state that on recent admission new deficits are speech slurred and needs a walker. Pt states upper and lower ext strength is about the same but she has overall poor balance. Pt was discharged on lisinopril 5 mg a day. She has PT and OT scheduled next week. Pt neurologist appointment not scheduled yet.(Dr. Erlinda Hong). Cardiology consult was considered but based on her records they did not think cardiology evaluation warranted.  Mri reviewed showed acute infarction left side pones. Echo done recently looked good. Carotid US in summer showing minimal plaque.   Note coreg and hydralazine discontinued on discharge.  Pt 2 days ago ate some food and then vomited twice. At grill chill sandwich. No diarrhea. No diffuse achiness all over. No fever, no chills or sweats. This has resolved.  Review of Systems  Constitutional: Negative for chills, fatigue and fever.  Respiratory: Negative for cough, chest tightness, shortness of breath and wheezing.   Cardiovascular: Negative for chest pain and palpitations.  Gastrointestinal: Negative for abdominal pain, nausea and vomiting.  Musculoskeletal: Negative for back pain.  Skin: Negative for rash.  Neurological: Positive for weakness. Negative for dizziness, numbness and headaches.       Poor balance. Mild slurred speech since hospitalization.  Hematological: Negative for adenopathy. Does not bruise/bleed easily.  Psychiatric/Behavioral: Negative for behavioral problems, confusion and sleep disturbance. The patient is not nervous/anxious.     Past Medical History:  Diagnosis Date  . Diabetes ([place])   . Diabetes mellitus without complication ([place])   . Diastolic dysfunction   . Hypertension   . Immune deficiency disorder ([place])   . Stroke [facility])      Social History   Social History  . Marital status: Married    Spouse name: N/A  . Number of children: N/A  . Years of education: N/A   Occupational History  . Not on file.   Social History Main Topics  . Smoking status: Never Smoker  . Smokeless tobacco: Never Used  . Alcohol use No     Comment: occ  . Drug use: No  . Sexual activity: Yes    Birth control/ protection: None   Other Topics Concern  . Not on file   Social History Narrative   ** Merged History Encounter **        Past Surgical History:  Procedure Laterality Date  . NO PAST SURGERIES      No family history on file.  Allergies  Allergen Reactions  . Metformin And Related Diarrhea    Current Outpatient Prescriptions on File Prior to Visit  Medication Sig Dispense Refill  . ACCU-CHEK FASTCLIX LANCETS MISC Check blood sugar twice daily. Dx E:11.9 (Patient taking differently: Check blood sugar twice daily. Dx E:11.9) 102 each 12  . aspirin EC 325 MG EC tablet Take 1 tablet (325 mg total) by mouth daily. 30 tablet 11  . atorvastatin (LIPITOR) 80 MG tablet Take 1 tablet (80 mg total) by mouth daily at 6 PM. 30 tablet 3  .  Blood Glucose Monitoring Suppl (ACCU-CHEK GUIDE) w/Device KIT 1 Device by Does not apply route as directed. Check blood sugar daily 1 kit 0  . glucose blood (ACCU-CHEK GUIDE) test strip Check blood sugar twice daily. Dx E:11.9 100 each 12  . insulin NPH-regular Human (NOVOLIN 70/30 RELION) (70-30) 100 UNIT/ML injection Inject 15 Units into the skin 2 (two) times daily with a meal. (Patient taking differently: Inject 20 Units into the skin 2 (two) times daily with a meal. ) 10 mL 2  . lisinopril (PRINIVIL,ZESTRIL) 5 MG tablet Take 1 tablet (5 mg total) by mouth daily. 30 tablet 3   . UNABLE TO FIND Outpatient physical therapy   Diagnosis: stroke, physical deconditioning (Patient not taking: Reported on 02/28/2016) 1 Mutually Defined 0   No current facility-administered medications on file prior to visit.     BP 130/70 Comment: checked twice.  Pulse 65   Temp 97.4 F (36.3 C) (Oral)   Resp 16   Ht '5\' 10"'  (1.778 m)   Wt 191 lb 6 oz (86.8 kg)   LMP 02/07/2016   SpO2 100%   BMI 27.46 kg/m       Objective:   Physical Exam  General Mental Status- Alert. General Appearance- Not in acute distress.   Skin General: Color- Normal Color. Moisture- Normal Moisture.  Neck Carotid Arteries- Normal color. Moisture- Normal Moisture. No carotid bruits. No JVD.  Chest and Lung Exam Auscultation: Breath Sounds:-Normal.  Cardiovascular Auscultation:Rythm- Regular. Murmurs & Other Heart Sounds:Auscultation of the heart reveals- No Murmurs.  Abdomen Inspection:-Inspeection Normal. Palpation/Percussion:Note:No mass. Palpation and Percussion of the abdomen reveal- Non Tender, Non Distended + BS, no rebound or guarding.  Neurologic Cranial Nerve exam:- CN III-XII intact(No nystagmus), symmetric smile. Drift Test:- No drift. Romberg Exam:- did not check obvious poor balance.  Heal to Toe Gait exam:-obvious poor balance. Finger to Nose:- Normal/Intact Strength:- 4/5 equal and symmetric strength both upper and lower extremities. Finger to nose test mild off.(just above tip)      Assessment & Plan:  For your history of stroke continue medication recommended on discharge.   Please attend the PT and OT appointments.  I will put in neurologist referral.(Call Anderson Malta here if no update by Friday on referral)  For fatigue post hospitalization will get cbc, cmp and ua.  Rx bp cuff to check daily. If bp increasing above 150/90 please let us know.  Stay on current low dose lisinopril.   With your history of both hermorrhagic and ischemic strokes be very careful  and if you have worse/new symptoms than current baseline function then ED evaluation.  Continue current diabetic meds.  Follow up with pcp in 3-4 weeks  Ellwood Steidle, Percell Miller, PA-C

## 2016-03-06 NOTE — Telephone Encounter (Signed)
Pt had leukocytes in urine. Will you get urine culture. Urine done yesterday.

## 2016-03-07 ENCOUNTER — Other Ambulatory Visit (INDEPENDENT_AMBULATORY_CARE_PROVIDER_SITE_OTHER): Payer: Medicaid Other

## 2016-03-07 ENCOUNTER — Telehealth: Payer: Self-pay | Admitting: Emergency Medicine

## 2016-03-07 ENCOUNTER — Encounter: Payer: Self-pay | Admitting: *Deleted

## 2016-03-07 ENCOUNTER — Telehealth: Payer: Self-pay | Admitting: *Deleted

## 2016-03-07 DIAGNOSIS — D649 Anemia, unspecified: Secondary | ICD-10-CM

## 2016-03-07 DIAGNOSIS — T17308A Unspecified foreign body in larynx causing other injury, initial encounter: Secondary | ICD-10-CM

## 2016-03-07 LAB — IBC PANEL
Iron: 12 ug/dL — ABNORMAL LOW (ref 42–145)
SATURATION RATIOS: 3.1 % — AB (ref 20.0–50.0)
TRANSFERRIN: 276 mg/dL (ref 212.0–360.0)

## 2016-03-07 LAB — COMPREHENSIVE METABOLIC PANEL
ALT: 16 U/L (ref 0–35)
AST: 18 U/L (ref 0–37)
Albumin: 3.9 g/dL (ref 3.5–5.2)
Alkaline Phosphatase: 66 U/L (ref 39–117)
BUN: 21 mg/dL (ref 6–23)
CHLORIDE: 106 meq/L (ref 96–112)
CO2: 27 mEq/L (ref 19–32)
Calcium: 9.1 mg/dL (ref 8.4–10.5)
Creatinine, Ser: 1.21 mg/dL — ABNORMAL HIGH (ref 0.40–1.20)
GFR: 61.11 mL/min (ref >60.00)
GLUCOSE: 46 mg/dL — AB (ref 70–99)
POTASSIUM: 4.1 meq/L (ref 3.5–5.1)
SODIUM: 139 meq/L (ref 135–145)
Total Bilirubin: 0.2 mg/dL (ref 0.2–1.2)
Total Protein: 6.9 g/dL (ref 6.0–8.3)

## 2016-03-07 LAB — FOLATE: FOLATE: 12.4 ng/mL (ref >5.9)

## 2016-03-07 LAB — IRON: IRON: 12 ug/dL — AB (ref 42–145)

## 2016-03-07 LAB — URINE CULTURE: ORGANISM ID, BACTERIA: NO GROWTH

## 2016-03-07 LAB — FERRITIN: Ferritin: 12.3 ng/mL (ref 10.0–291.0)

## 2016-03-07 LAB — VITAMIN B12: VITAMIN B 12: 411 pg/mL (ref 211–911)

## 2016-03-07 NOTE — Telephone Encounter (Signed)
Orders placed as directed. Discussed w/ Candise BowensJen, referral coordinator, she is working to schedule patient ASAP.

## 2016-03-07 NOTE — Telephone Encounter (Signed)
Note sent to various staff members to call pt. Marked as important.

## 2016-03-07 NOTE — Telephone Encounter (Signed)
Great!

## 2016-03-07 NOTE — Telephone Encounter (Signed)
Spoke w/ pt's husband, Marcial Pacasimothy, regarding lab results. He mentioned that pt is having some coughing/choking when drinking thin liquids. She is s/p stroke and did not have swallow eval/MBS while inpatient-this was deferred for full outpatient eval. Pt has upcoming appt w/ ST/OT/PT next Thursday.   I called [facility] (Spoke w/ Angie, [phone]) to see if they could move up speech therapy appt and they advised that she should have MBS, preferably prior to scheduled therapy appt.   Please advise if okay to place orders for MBS, dx Choking, initial encounter [[id number]] and I will place orders.

## 2016-03-07 NOTE — Telephone Encounter (Signed)
You don't think-- thn can help us with her too -- do you?

## 2016-03-07 NOTE — Telephone Encounter (Signed)
Hope from [facility] called critical Glucose 46 on this patient. Verbal was reported to WESCO InternationalEdward Saguier.,,KMP

## 2016-03-07 NOTE — Telephone Encounter (Signed)
Please see result note on pt sugar/cmp.

## 2016-03-07 NOTE — Telephone Encounter (Signed)
Urine Cult. Was ordered and sent out with the evening pick up 03-05-16 after POCT was ran....KMP

## 2016-03-07 NOTE — Telephone Encounter (Signed)
This encounter was created in error - please disregard.

## 2016-03-07 NOTE — Telephone Encounter (Signed)
Please see result note 

## 2016-03-07 NOTE — Telephone Encounter (Addendum)
What services is she needing? Looks like she has Medicaid. Given recent discharge, [facility] social work/case management might can help her if needed. Per inpatient case management note, she told them she had been approved for Medicaid but had not received her card yet.

## 2016-03-07 NOTE — Telephone Encounter (Signed)
ok 

## 2016-03-08 ENCOUNTER — Other Ambulatory Visit (HOSPITAL_COMMUNITY): Payer: Self-pay | Admitting: Family Medicine

## 2016-03-08 ENCOUNTER — Telehealth: Payer: Self-pay | Admitting: Family Medicine

## 2016-03-08 DIAGNOSIS — N926 Irregular menstruation, unspecified: Secondary | ICD-10-CM

## 2016-03-08 DIAGNOSIS — R1310 Dysphagia, unspecified: Secondary | ICD-10-CM

## 2016-03-08 DIAGNOSIS — D509 Iron deficiency anemia, unspecified: Secondary | ICD-10-CM

## 2016-03-08 NOTE — Telephone Encounter (Signed)
Will you call pt and see how she is recent low blood sugars. She is on novolin. I thought she was on sliding scale. I was mistaken. Ask how many units she is on now novollin 70/30? Then will probably decrease by about 2 units each time but want to verify what she is on first.

## 2016-03-08 NOTE — Telephone Encounter (Signed)
Spoke with patient's husband at first [pt has had CVA [x2]; she has had Irregular menses since her first CVA [09/2015, 2nd in January 2018]; Referral placed for GYN at [facility]. Patient informed, understood & agreed/SLS  Regarding CBGs, patient reports that her numbers have been good around the 100 range, she is only taking NPH Novolin 70/30, 15 Units BID/SLS 02/09

## 2016-03-08 NOTE — Telephone Encounter (Signed)
Noted, awaiting [facility] note.

## 2016-03-08 NOTE — Telephone Encounter (Signed)
°  Relation to pt: self  Call back number: [phone]    Reason for call:  Patient requesting a referral to GYN due to vaginal bleeding, patient transferred to [facility].

## 2016-03-10 NOTE — Telephone Encounter (Signed)
Placed referral to hematologist. Will you send them the information on 03-11-2016.

## 2016-03-11 ENCOUNTER — Telehealth: Payer: Self-pay | Admitting: *Deleted

## 2016-03-11 DIAGNOSIS — D509 Iron deficiency anemia, unspecified: Secondary | ICD-10-CM

## 2016-03-11 NOTE — Telephone Encounter (Signed)
Pt notified and referral placed.  

## 2016-03-11 NOTE — Telephone Encounter (Signed)
-----   Message from Esperanza RichtersEdward Saguier, PA-C sent at 03/10/2016  9:30 PM EST ----- Low iron, saturation ration and lower end ferritin with mild anemia. Folate and b12 normal. With fatigue and trouble swallowing will refer to Dr. Myna HidalgoEnnever for consult for possible iron infusion.

## 2016-03-12 ENCOUNTER — Inpatient Hospital Stay (HOSPITAL_COMMUNITY): Admission: RE | Admit: 2016-03-12 | Payer: Self-pay | Source: Ambulatory Visit

## 2016-03-12 ENCOUNTER — Other Ambulatory Visit: Payer: Self-pay | Admitting: Family Medicine

## 2016-03-12 ENCOUNTER — Ambulatory Visit (HOSPITAL_COMMUNITY): Admission: RE | Admit: 2016-03-12 | Payer: Medicaid Other | Source: Ambulatory Visit

## 2016-03-12 ENCOUNTER — Telehealth: Payer: Self-pay | Admitting: Family Medicine

## 2016-03-12 DIAGNOSIS — D509 Iron deficiency anemia, unspecified: Secondary | ICD-10-CM

## 2016-03-12 NOTE — Telephone Encounter (Signed)
ifob ordered canceled since she never got test done. But would you place order again if she does study. Associated it with anemia.

## 2016-03-12 NOTE — Telephone Encounter (Signed)
Referral sent to Dr Myna HidalgoEnnever for review, awaiting appt. LM ON VM ok per DPR, [facility] awaiting [facility], office number given to patient

## 2016-03-12 NOTE — Telephone Encounter (Signed)
Referral sent to Dr Gustavo LahEnnever's, awaiting appt

## 2016-03-12 NOTE — Telephone Encounter (Signed)
°  Relation to ZO:XWRUpt:self Call back number: [phone]   Reason for call:  Patient has a Blood Pressure monitor Rx and she's unsure where to go for the monitor, please advise

## 2016-03-12 NOTE — Telephone Encounter (Signed)
Called the patient informed any drug store/medical supply store in the area.  Patient states she has medicaid and she has checked many places and no one takes her insurance.

## 2016-03-14 ENCOUNTER — Encounter: Payer: Self-pay | Admitting: Rehabilitation

## 2016-03-14 ENCOUNTER — Ambulatory Visit: Payer: Medicaid Other | Attending: Family Medicine | Admitting: Rehabilitation

## 2016-03-14 ENCOUNTER — Ambulatory Visit: Payer: Medicaid Other

## 2016-03-14 ENCOUNTER — Ambulatory Visit: Payer: Medicaid Other | Admitting: Occupational Therapy

## 2016-03-14 DIAGNOSIS — M6281 Muscle weakness (generalized): Secondary | ICD-10-CM | POA: Insufficient documentation

## 2016-03-14 DIAGNOSIS — R278 Other lack of coordination: Secondary | ICD-10-CM | POA: Diagnosis present

## 2016-03-14 DIAGNOSIS — R41841 Cognitive communication deficit: Secondary | ICD-10-CM | POA: Diagnosis present

## 2016-03-14 DIAGNOSIS — I69318 Other symptoms and signs involving cognitive functions following cerebral infarction: Secondary | ICD-10-CM | POA: Diagnosis present

## 2016-03-14 DIAGNOSIS — R1312 Dysphagia, oropharyngeal phase: Secondary | ICD-10-CM

## 2016-03-14 DIAGNOSIS — R471 Dysarthria and anarthria: Secondary | ICD-10-CM

## 2016-03-14 DIAGNOSIS — I69351 Hemiplegia and hemiparesis following cerebral infarction affecting right dominant side: Secondary | ICD-10-CM | POA: Diagnosis present

## 2016-03-14 DIAGNOSIS — R2689 Other abnormalities of gait and mobility: Secondary | ICD-10-CM

## 2016-03-14 DIAGNOSIS — R2681 Unsteadiness on feet: Secondary | ICD-10-CM | POA: Diagnosis present

## 2016-03-14 DIAGNOSIS — I69354 Hemiplegia and hemiparesis following cerebral infarction affecting left non-dominant side: Secondary | ICD-10-CM

## 2016-03-14 NOTE — Therapy (Signed)
[facility] [address] [place], [place], [address] Phone: [phone]   Fax:  [phone]  Physical Therapy Evaluation  Patient Details  Name: Stephanie ShirtsMarva N Lloyd MRN: [id number] Date of Birth: 06/21/1970 Referring Provider: Donato SchultzYvonne R [name], DO  Encounter Date: 03/14/2016      PT End of Session - 03/14/16 1048    Visit Number 1   Number of Visits 9   Date for PT Re-Evaluation 05/27/16   Authorization Type MCD (awaiting approval)   PT Start Time 0932   PT Stop Time 1018   PT Time Calculation (min) 46 min   Activity Tolerance Patient tolerated treatment well   Behavior During Therapy [facility] for tasks assessed/performed      Past Medical History:  Diagnosis Date  . Diabetes (HCC)   . Diabetes mellitus without complication (HCC)   . Diastolic dysfunction   . Hypertension   . Immune deficiency disorder (HCC)   . Stroke [facility](HCC)     Past Surgical History:  Procedure Laterality Date  . NO PAST SURGERIES      There were no vitals filed for this visit.       Subjective Assessment - 03/14/16 0938    Subjective "I want to be stable and improve my walking.  I want to walk without the walker."   Patient is accompained by: Family member   Pertinent History Goes by "Stephanie Lloyd" (husband Harvie HeckRandy)    Limitations House hold activities;Walking;Standing   Currently in Pain? No/denies            [facility] - 03/14/16 [id number]      Assessment   Medical Diagnosis Pont[place] CVA   Referring Provider Donato SchultzYvonne R [name], DO   Onset Date/Surgical Date 02/28/16   Hand Dominance Right     Precautions   Precautions Fall     Restrictions   Weight Bearing Restrictions No     Balance Screen   Has the patient fallen in the past 6 months Yes   How many times? 2   Has the patient had a decrease in activity level because of a fear of falling?  No   Is the patient reluctant to leave their home because of a fear of  falling?  No     Home Nurse, mental healthnvironment   Living Environment Private residence   Living Arrangements Other relatives;Spouse/significant other   Available Help at Discharge Family;Available 24 hours/day   Type of Home House   Home Access Stairs to enter   Entrance Stairs-Number of Steps 1   Entrance Stairs-Rails None   Home Layout One level;Two level  upstairs to use shower   Alternate Level Stairs-Number of Steps 13   Alternate Level Stairs-Rails Right   Home Equipment Walker - 2 wheels;Shower seat;Hand held Engineer, miningshower head  tub/shower     Prior Function   Level of Independence Independent with basic ADLs;Independent with community mobility without device   Vocation Unemployed   Leisure I like to walk for exercise     Cognition   Overall Cognitive Status Impaired/Different from baseline   Area of Impairment Memory     Sensation   Light Touch Appears Intact  some heat/cold sensitivity   Hot/Cold Appears Intact   Proprioception Appears Intact     Coordination   Gross Motor Movements are Fluid and Coordinated Yes  in LEs   Fine Motor Movements are Fluid and Coordinated Yes  in LEs     ROM / Strength  AROM / PROM / Strength Strength     AROM   Overall AROM Comments BLE AROM WFLs     Strength   Overall Strength Deficits   Overall Strength Comments R hip flex 3/5, L hip flex 3+/5, R knee ext 4/5, L knee ext 5/5, R knee flex 3+/5, L knee flex 4/5, R and L ankle DF/PF 4/5     Bed Mobility   Bed Mobility --  Note that she has to get up from air mattress (single)      Transfers   Transfers Sit to Stand;Stand to Sit   Sit to Stand 6: Modified independent (Device/Increase time)   Five time sit to stand comments  29.50 secs with hands on lap   Stand to Sit 6: Modified independent (Device/Increase time)     Ambulation/Gait   Ambulation/Gait Yes   Ambulation/Gait Assistance 6: Modified independent (Device/Increase time);5: Supervision   Ambulation/Gait Assistance Details Pt with  slow gait speed and short shuffled steps   Ambulation Distance (Feet) 115 Feet   Assistive device Rolling walker   Gait Pattern Step-through pattern;Decreased arm swing - right;Decreased arm swing - left;Decreased stride length;Decreased hip/knee flexion - right;Decreased hip/knee flexion - left;Lateral hip instability;Shuffle;Narrow base of support   Ambulation Surface Level;Indoor   Gait velocity .99 ft/sec with RW   Stairs Yes   Stairs Assistance 6: Modified independent (Device/Increase time)   Stair Management Technique One rail Right;Step to pattern;Forwards   Number of Stairs 4   Height of Stairs 6     Standardized Balance Assessment   Standardized Balance Assessment Berg Balance Test     Berg Balance Test   Sit to Stand Able to stand  independently using hands   Standing Unsupported Able to stand safely 2 minutes   Sitting with Back Unsupported but Feet Supported on Floor or Stool Able to sit safely and securely 2 minutes   Stand to Sit Sits safely with minimal use of hands   Transfers Able to transfer safely, minor use of hands   Standing Unsupported with Eyes Closed Able to stand 10 seconds with supervision   Standing Ubsupported with Feet Together Able to place feet together independently and stand for 1 minute with supervision   From Standing, Reach Forward with Outstretched Arm Can reach confidently >25 cm (10")   From Standing Position, Pick up Object from Floor Able to pick up shoe, needs supervision   From Standing Position, Turn to Look Behind Over each Shoulder Looks behind from both sides and weight shifts well   Turn 360 Degrees Able to turn 360 degrees safely but slowly   Standing Unsupported, Alternately Place Feet on Step/Stool Able to stand independently and complete 8 steps >20 seconds   Standing Unsupported, One Foot in Front Able to plae foot ahead of the other independently and hold 30 seconds   Standing on One Leg Able to lift leg independently and hold equal  to or more than 3 seconds   Total Score 46   Berg comment: 46-51 moderate (>50%)                            PT Education - 03/14/16 1047    Education provided Yes   Education Details evaluation findings, goals, POC, initial HEP, walking program.    Person(s) Educated Patient   Methods Explanation;Demonstration;Handout   Comprehension Verbalized understanding;Returned demonstration          PT Short Term Goals -  03/14/16 1233      PT SHORT TERM GOAL #1   Title Pt will perform initial HEP with mod I using paper handout to indicate safe compliance, maximize functional gains. Target date: Following 4th visit after eval)   Baseline dependent for current program   Time 4   Period Weeks   Status New     PT SHORT TERM GOAL #2   Title Pt will improve BERG balance score to >52/56 in order to indicate decreased fall risk.     Baseline 46/56 on 03/14/16   Time 4   Period Weeks   Status New     PT SHORT TERM GOAL #3   Title Pt will increase gait speed to 1.59 ft/sec in order to indicate decreased fall risk and improved efficiency of gait.     Baseline .99 ft/sec on 03/14/16   Time 4   Period Weeks   Status New     PT SHORT TERM GOAL #4   Title Will assess and improve 150' from baseline in order to indicate improved functional endurnace.    Baseline did not have time to assess on eval   Time 4   Period Weeks   Status New     PT SHORT TERM GOAL #5   Title Pt will perform floor recovery transfer at mod I level in order to indicate safe transfer from floor and safety getting up from air mattress.     Baseline requires assist from husband and RW at this time   Time 4   Period Weeks   Status New     Additional Short Term Goals   Additional Short Term Goals Yes     PT SHORT TERM GOAL #6   Title Pt will ambulate up to 200' over indoor surfaces with SPC at S level in order to indicate increased independence with household gait.    Baseline S to mod I level  with RW over indoor surfaces   Time 4   Period Weeks   Status New     PT SHORT TERM GOAL #7   Title Pt will improve 5TSS time to <20 secs without UE support in order to indicate improved functional endurance.     Baseline 29.50 secs with UE support on lap           PT Long Term Goals - 03/14/16 1243      PT LONG TERM GOAL #1   Title Pt will be independent with handout with final HEP in order to indicate improved functional mobility.  (Target Date: following 8th visit after eval)   Baseline dependent    Time 8   Period Weeks   Status New     PT LONG TERM GOAL #2   Title Will perform FGA as able and set goal as appropriate to indicate improved balance.     Baseline 46/56 on BERG   Time 8   Period Weeks   Status New     PT LONG TERM GOAL #3   Title Pt will improve gait speed to 2.19 ft/sec in order to indicate decreased fall risk and improved efficiency of gait.     Baseline .99 ft/sec 03/14/16   Time 8   Period Weeks   Status New     PT LONG TERM GOAL #4   Title Pt will ambulate up to 500' over unlevel paved surfaces (including ramp/curb) w/ SPC or LRAD at S level (due to cognition) in order to improve community  negotiation.     Baseline not assessed on eval   Time 8   Period Weeks   Status New     PT LONG TERM GOAL #5   Title Pt will improve by 300' from baseline in order to indicate improved functional endurance.     Baseline not assessed on eval   Time 8   Period Weeks   Status New     Additional Long Term Goals   Additional Long Term Goals Yes     PT LONG TERM GOAL #6   Title Pt will perform 5TSS in </=15 secs without UE support in order to indicate improved functional strength.     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  Upon PT evaluation, note gait speed is .99 ft/sec with RW indicative of high fall risk, BERG balance score of 46/56 indicative of moderate fall risk and 5TSS time of 29.50 secs indicative of decreased functional strength.  Pt is of evolving presentation and moderate complexity from PT POC standpoint.  Pt will benefit from skilled OP neuro PT to address deficits.      Rehab Potential Good   PT Frequency 1x / week   PT Duration 8 weeks   PT Treatment/Interventions ADLs/Self Care Home Management;Electrical Stimulation;DME Instruction;Gait training;Stair training;Functional mobility training;Therapeutic activities;Therapeutic exercise;Balance training;Neuromuscular re-education;Patient/family education;Orthotic Fit/Training;Energy conservation;Vestibular;Visual/perceptual remediation/compensation   PT Next Visit Plan , floor transfer, gait with LRAD (I think she can move away from RW at least in the house), gait mechanics (larger wider steps, increased arm swing), add to HEP for R hip strength   PT Home Exercise Plan See pt instruction from 03/14/16   Consulted and Agree with Plan of Care Patient      Patient will benefit from skilled therapeutic intervention in order to improve the following deficits and impairments:  Abnormal gait, Decreased activity tolerance, Decreased balance, Decreased cognition, Decreased endurance, Decreased knowledge of use of DME, Decreased mobility, Decreased strength, Impaired perceived functional ability, Impaired flexibility, Impaired UE functional use, Improper body mechanics, Impaired vision/preception, Postural dysfunction  Visit Diagnosis: Hemiplegia and hemiparesis following cerebral infarction affecting right dominant side (HCC) - Plan: PT plan of care cert/re-cert  Unsteadiness on feet - Plan: PT plan of care  cert/re-cert  Muscle weakness (generalized) - Plan: PT plan of care cert/re-cert  Other abnormalities of gait and mobility - Plan: PT plan of care cert/re-cert     Problem List Patient Active Problem List   Diagnosis Date Noted  . History of stroke   . History of intracerebral hemorrhage without residual deficit   . Cerebrovascular accident (CVA) (HCC) 02/28/2016  . Ischemic stroke (HCC) 02/28/2016  . Acute kidney injury (HCC) 10/28/2015  . Acute encephalopathy 10/28/2015  . Diastolic dysfunction   . Stroke (cerebrum) (HCC)   . Focal infarction of brain (HCC) 10/08/2015  . Syncope 08/19/2015  . Hypertensive emergency 07/29/2015  . Hypertensive urgency 07/28/2015  . Hypokalemia 07/28/2015  . Accelerated hypertension 09/21/2014  . Type 2 diabetes mellitus with other circulatory complications 09/21/2014  . Hyperlipidemia 09/21/2014  .  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 Hyperlipidemia LDL goal <70 07/29/2014  . Hemorrhagic stroke (HCC) 06/22/2014  . ICH (intracerebral hemorrhage) (HCC) 06/22/2014  . Essential hypertension 05/13/2012  . Diabetes mellitus (HCC) 05/13/2012    Stephanie Lloyd, PT, MPT [facility] [address] [place], [place], [address] Phone: [phone]   Fax:  [phone] 03/14/16, 1:00 PM  Name: Stephanie Lloyd MRN: [id number] Date of Birth: 28-Oct-1970

## 2016-03-14 NOTE — Patient Instructions (Signed)
Signs of Aspiration Pneumonia   . Chest pain/tightness . Fever (can be low grade) . Cough  o With foul-smelling phlegm (sputum) o With sputum containing pus or blood o With greenish sputum . Fatigue  . Shortness of breath  . Wheezing   **IF YOU HAVE THESE SIGNS, CONTACT YOUR DOCTOR OR GO TO THE EMERGENCY DEPARTMENT OR URGENT CARE AS SOON AS POSSIBLE**      =====================================================   SWALLOW HARD WITH EVERYTHING!  Have pureed foods and SMALL sips water between bites.  Creamed soups are good - make sure to blend them first. Stephanie Lloyd AlertWendi should be calling you to set up a swallow test.

## 2016-03-14 NOTE — Therapy (Signed)
[facility] [address] [place], [place], [address] Phone: [phone]   Fax:  [phone]  Occupational Therapy Evaluation  Patient Details  Name: Stephanie Lloyd MRN: [id number] Date of Birth: Oct 28, 1970 Referring Provider: Dr. Florina Ou  Encounter Date: 03/14/2016      OT End of Session - 03/14/16 0953    Visit Number 1   Number of Visits 9   Date for OT Re-Evaluation 05/12/16   Authorization Type MCD - awaiting authorization   OT Start Time 0850   OT Stop Time 0935   OT Time Calculation (min) 45 min   Activity Tolerance Patient tolerated treatment well      Past Medical History:  Diagnosis Date  . Diabetes (HCC)   . Diabetes mellitus without complication (HCC)   . Diastolic dysfunction   . Hypertension   . Immune deficiency disorder (HCC)   . Stroke [facility])     Past Surgical History:  Procedure Laterality Date  . NO PAST SURGERIES      There were no vitals filed for this visit.      Subjective Assessment - 03/14/16 0854    Patient is accompained by: Family member  husband Harvie Heck)   Pertinent History Hemorrhagic CVA x2 (05/2014, 09/2015)    Patient Stated Goals be able to cook again   Currently in Pain? No/denies           [facility] OT Assessment - 03/14/16 0857      Assessment   Diagnosis CVA    Referring Provider Dr. Florina Ou   Onset Date 02/28/16   Assessment Pt/family reports 1st CVA 05/2014 affected Lt side, then 2nd CVA 09/2015 affected Rt side. This stroke on 02/28/16 affected bilaterally due to in pons. (MRI's revealed several other smaller strokes as well)    Prior Therapy acute care only     Precautions   Precautions Fall   Precaution Comments fall risk, no driving since 2536     Balance Screen   Has the patient fallen in the past 6 months Yes   How many times? 2   Has the patient had a decrease in activity level because of a fear of falling?  No   Is the patient  reluctant to leave their home because of a fear of falling?  No     Home  Environment   Bathroom Shower/Tub Tub/Shower Systems analyst - 2 wheels   Additional Comments Pt lives with spouse and other family members in 2 story home with 1 step to enter, 1/2 bath on 1st floor. Pt goes to 2nd floor for showering   Lives With Spouse;Family     Prior Function   Level of Independence Independent with basic ADLs  but has not cooked/cleaned since 1st CVA 2016   Vocation Unemployed  filed for disability   Vocation Requirements n/a     ADL   Eating/Feeding Set up  husband cuts food   Grooming Independent  husband styles hair (since 1st stroke 2016)    Upper Body Bathing Modified independent  increased time   Lower Body Bathing Increased time   Upper Body Dressing Independent;Increased time   Lower Body Dressing Modified independent  with slip on shoes   Toilet Tranfer Independent   Toileting - Clothing Manipulation Independent   Toileting -  Hygiene Independent   Tub/Shower Transfer Modified independent  decreased safety   ADL comments Pt was washing dishes and doing laundry prior  to recent stroke but no heavy cleaning     IADL   Light Housekeeping --  pt returned to washing dishes, but not laundry   Meal Prep Able to complete simple cold meal and snack prep   Community Mobility Relies on family or friends for transportation  No driving since May 2016 (first stroke)    Medication Management Is responsible for taking medication in correct dosages at correct time   Financial Management --  not since 1st stroke in 2016     Mobility   Mobility Status Comments uses FWW     Written Expression   Dominant Hand Right   Handwriting Increased time;90% legible  Name only, in print (has not been able to write cursive since 2016)     Vision - History   Baseline Vision Wears glasses all the time   Additional Comments Pt reports difficulty reading small  print and decreased bilateral peripheral vision, light sensitivity since 1st stroke     Cognition   Memory Impaired     Observation/Other Assessments   Observations spells "world" backwards, delayed recall 3/3, mental subtractions by 3's with max difficulty, decr. processing, and had to stop after 4 subtractions with 1 error     Sensation   Light Touch Not tested     Coordination   9 Hole Peg Test Right;Left   Right 9 Hole Peg Test 43.25 sec   Left 9 Hole Peg Test 45.57 sec     Edema   Edema none     ROM / Strength   AROM / PROM / Strength AROM;Strength     AROM   Overall AROM Comments BUE AROM WFL's     Strength   Overall Strength Comments RUE MMT grossly 4/5. LUE MMT grossly 4+/5 except ER 4/5     Hand Function   Right Hand Grip (lbs) 38 lbs   Left Hand Grip (lbs) 50 lbs                              OT Long Term Goals - 03/14/16 1002      OT LONG TERM GOAL #1   Title Independent with coordination HEP and putty HEP    Baseline DEPENDENT   Time 8   Period Weeks   Status New     OT LONG TERM GOAL #2   Title Improve coordination bilaterally as evidenced by reducing speed on 9 hole peg test by 5 sec. or more   Baseline eval: Rt = 43.25 sec, Lt = 45.57 sec   Time 8   Period Weeks   Status New     OT LONG TERM GOAL #3   Title Grip strength Rt dominant hand to be 45 lbs or greater to open jars/containers   Baseline eval = 38 lbs (Lt = 50 lbs)    Time 8   Period Weeks   Status New     OT LONG TERM GOAL #4   Title Pt/family to verbalize understanding with memory compensatory strategies and how to implement    Baseline dependent   Time 8   Period Weeks   Status New     OT LONG TERM GOAL #5   Title Pt to fully return to laundry tasks and consistently preparing snacks, cold meal, microwaveable items    Baseline dependent since recent stroke   Time 8   Period Weeks   Status New     Long  Term Additional Goals   Additional Long Term  Goals Yes     OT LONG TERM GOAL #6   Title Cognition goal TBD prn    Baseline (based on MOCA score next visit)    Time 8   Period Weeks   Status New               Plan - 03/14/16 0954    Clinical Impression Statement Pt is a 46 y.o. female who presents to outpatient rehab for O.T. evaluation s/p ischemic stroke to pons affecting bilateral sides on 02/28/16. Pt was hospitalized 02/28/16 and discharged 03/01/16. 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 Pt has not driven, worked, or performed most IADLS since first stroke in 2016. Most recent stroke increased deficits in strength, coordination, balance and cognition.    Rehab Potential Fair   Clinical Impairments Affecting Rehab Potential premorbid status (previous CVA's x2), limitied visits with insurance   OT Frequency 1x / week   OT Duration 8 weeks  plus evaluation   Plan Check for MCD authorization and update in note, MOCA and write cognition goal prn, assess Box & Blocks, begin coordination HEP (bilaterally) and putty HEP (for Rt hand)   Consulted and Agree with Plan of Care Patient;Family member/caregiver   Family Member Consulted husband      Patient will benefit from skilled therapeutic intervention in order to improve the following deficits and impairments:  Decreased coordination, Decreased endurance, Decreased safety awareness, Decreased activity tolerance, Decreased knowledge of precautions, Impaired tone, Decreased balance, Decreased knowledge of use of DME, Impaired UE functional use, Decreased cognition, Decreased mobility, Decreased strength, Impaired perceived functional ability, Impaired vision/preception  Visit Diagnosis: Hemiplegia and hemiparesis following cerebral infarction affecting right dominant side (HCC) - Plan: Ot plan of care cert/re-cert  Hemiplegia and hemiparesis following cerebral infarction affecting left  non-dominant side (HCC) - Plan: Ot plan of care cert/re-cert  Muscle weakness (generalized) - Plan: Ot plan of care cert/re-cert  Other lack of coordination - Plan: Ot plan of care cert/re-cert  Other symptoms and signs involving cognitive functions following cerebral infarction - Plan: Ot plan of care cert/re-cert    Problem List Patient Active Problem List   Diagnosis Date Noted  . History of stroke   . History of intracerebral hemorrhage without residual deficit   . Cerebrovascular accident (CVA) (HCC) 02/28/2016  . Ischemic stroke (HCC) 02/28/2016  . Acute kidney injury (HCC) 10/28/2015  . Acute encephalopathy 10/28/2015  . Diastolic dysfunction   . Stroke (cerebrum) (HCC)   . Focal infarction of brain (HCC) 10/08/2015  . Syncope 08/19/2015  . Hypertensive emergency 07/29/2015  . Hypertensive urgency 07/28/2015  . Hypokalemia 07/28/2015  . Accelerated hypertension 09/21/2014  . Type 2 diabetes mellitus with other circulatory complications 09/21/2014  . Hyperlipidemia 09/21/2014  . 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 Hyperlipidemia LDL goal <70 07/29/2014  . Hemorrhagic stroke (HCC) 06/22/2014  . ICH (intracerebral hemorrhage) (HCC) 06/22/2014  . Essential hypertension 05/13/2012  . Diabetes mellitus (HCC) 05/13/2012    Kelli Churn, OTR/L 03/14/2016, 10:10 AM  [facility] [address] [place], [place], [address] Phone: [phone]   Fax:  [phone]  Name: Stephanie Lloyd MRN: [id number] Date of Birth: 04-01-1970

## 2016-03-14 NOTE — Patient Instructions (Signed)
Functional Quadriceps: Sit to Stand    Sit on edge of chair, feet flat on floor. Stand upright, extending knees fully.  See if you can do without your hands.  Keep feet shoulder width apart and knees apart when sitting and standing.  When sitting, think about bowing forward and sticking your bottom out to reach for chair.   Repeat __10__ times per set. Do __2__ sets per session. Do __2__ sessions per day.  http://orth.exer.us/735   Copyright  VHI. All rights reserved.   WALKING  Walking is a great form of exercise to increase your strength, endurance and overall fitness.  A walking program can help you start slowly and gradually build endurance as you go.  Everyone's ability is different, so each person's starting point will be different.  You do not have to follow them exactly.  The are just samples. You should simply find out what's right for you and stick to that program.   In the beginning, you'll start off walking 2-3 times a day for short distances.  As you get stronger, you'll be walking further at just 1-2 times per day.  A. You Can Walk For A Certain Length Of Time Each Day    Walk 8 minutes 2 times per day.  Increase 1 minutes every 7 days.  Work up to 25-30 minutes (1-2 times per day).   Example:   Day 1-2 8 minutes 3 times per day   Day 7-8 9-10 minutes 2-3 times per day   Day 13-14 10-15 minutes 1-2 times per day  B. You Can Walk For a Certain Distance Each Day     Distance can be substituted for time.    Example:   3 trips to mailbox (at road)   3 trips to corner of block   3 trips around the block  C. Go to local [employer] and use the track.    Walk for distance ____ around track  Or time ____ minutes  Please only do the exercises that your therapist has initialed and dated

## 2016-03-14 NOTE — Therapy (Signed)
[facility] [address] [place], [place], [address] Phone: [phone]   Fax:  [phone]  Speech Language Pathology Evaluation  Patient Details  Name: Stephanie Lloyd MRN: [id number] Date of Birth: 1970-04-03 Referring Provider: Seabron Spates, MD  Encounter Date: 03/14/2016      End of Session - 03/14/16 5366    Visit Number 1   Number of Visits 9   Date for SLP Re-Evaluation 05/17/16   Authorization - Visit Number 1   Authorization - Number of Visits 9  (8 therapy sessions + eval)   SLP Start Time 0806   SLP Stop Time  0849   SLP Time Calculation (min) 43 min   Activity Tolerance Patient tolerated treatment well      Past Medical History:  Diagnosis Date  . Diabetes (HCC)   . Diabetes mellitus without complication (HCC)   . Diastolic dysfunction   . Hypertension   . Immune deficiency disorder (HCC)   . Stroke [facility])     Past Surgical History:  Procedure Laterality Date  . NO PAST SURGERIES      There were no vitals filed for this visit.      Subjective Assessment - 03/14/16 0823    Subjective Pt reports slurred speech since her last CVA late January 2018, and husband reports coughing with meals, 2-3 times a meal. Upon questioning pt also says short term memory has decr'd since most recent CVA.   Patient is accompained by: --  Husband Randy   Currently in Pain? No/denies            SLP Evaluation [facility] - 03/14/16 4403  SPEECH/LANGUAGE     SLP Visit Information   SLP Received On 03/14/16   Referring Provider Zola Button, Myrene Buddy, MD   Onset Date 2016; most recent CVA end Jan 2018   Medical Diagnosis CVA     Subjective   Subjective Pt with multiple CVAs, advanced white matter disease. Most recent CVA approx Jan 29-30, 2018.     Prior Functional Status   Cognitive/Linguistic Baseline Baseline deficits   Baseline deficit details Short term Memory    Lives With Spouse   Vocation  Unemployed     Cognition   Overall Cognitive Status Impaired/Different from baseline   Area of Impairment Memory  pt look to husband- A with medical history, s/s past CVAs     Auditory Comprehension   Overall Auditory Comprehension Appears within functional limits for tasks assessed     Verbal Expression   Overall Verbal Expression Appears within functional limits for tasks assessed     Oral Motor/Sensory Function   Overall Oral Motor/Sensory Function Impaired   Labial ROM Reduced right;Reduced left  more pronounced on rt   Labial Symmetry Abnormal symmetry right;Abnormal symmetry left  moreson on rt   Labial Strength Reduced   Labial Sensation Reduced   Labial Coordination Reduced   Lingual ROM Reduced right;Reduced left  moreso rt   Lingual Symmetry Within Functional Limits  mild   Lingual Strength Reduced  more on rt than lt   Lingual Coordination Reduced   Facial ROM Reduced right;Reduced left   Facial Symmetry Left droop   Velum Impaired left  mild     Motor Speech   Overall Motor Speech Impaired   Respiration Impaired   Level of Impairment Word   Phonation Low vocal intensity   Articulation Impaired   Level of Impairment Word   Intelligibility Intelligibility reduced  Word 75-100% accurate   Phrase 75-100% accurate   Sentence 75-100% accurate  85-90%   Conversation 75-100% accurate  90-95% depending on utternace length&fatigue level   Motor Speech Errors Consistent;Aware   Interfering Components Premorbid status  premorbid dysarthric speech - mild   Effective Techniques Increased vocal intensity;Over-articulate;Pause           Prior Functional Status - 03/14/16 0915      Prior Functional Status   Cognitive/Linguistic Baseline Baseline deficits   Baseline deficit details Short term Memory   Type of Home House    Lives With Spouse   Vocation Unemployed         General - 03/14/16 0915  SWALLOWING/BSE     General Information   HPI Pt with  some rare/occasional coughing during the week premorbidly due to other past CVAs. No overt s/s aspiration PNA today or previously, per pt/husband. Husband says pt coughs with both liquids and solids 2-3 times per meal.   Type of Study Bedside Swallow Evaluation   Diet Prior to this Study Regular;Thin liquids   Temperature Spikes Noted No   Respiratory Status Room air   Behavior/Cognition Cooperative;Pleasant mood;Alert   Oral Cavity Assessment Within Functional Limits   Oral Cavity - Dentition Adequate natural dentition   Vision Functional for self-feeding   Self-Feeding Abilities Able to feed self   Baseline Vocal Quality Normal   Volitional Cough Weak   Volitional Swallow Able to elicit  mildly delayed (~3 sec)          Nectar thick liquid - 03/14/16 0918      Nectar Thick Liquid   Nectar Thick Liquid --  reportedly impaired   Presentation Cup   Other Comments pt with small sips x5, no overt s/s aspiration          Puree - 03/14/16 0919      Puree   Puree Within functional limits   Presentation Spoon;Self Fed   Other Comments pt with approx 1 teaspoon bites of applesauce x3 without overt s/s aspiration. SLP had pt perform liquid wash and ALL swallows with effort.              SLP Education - 03/14/16 0920    Education provided Yes   Education Details swallow precautions until modified (MBSS)(small sips and bites with EFFORTFUL swallows, alternate bite/sip), SLP rec puree with other homogenous food items (creamed blended soups, potatoes with gravy, etc), ST course, possible goals   Person(s) Educated Patient;Spouse   Methods Explanation;Demonstration;Verbal cues;Handout   Comprehension Verbalized understanding;Returned demonstration;Verbal cues required;Need further instruction          SLP Short Term Goals - 03/14/16 0942      SLP SHORT TERM GOAL #1   Title pt will complete dysarthria HEP with occasional min A over two sessions   Baseline not provided  yet   Time 4   Period Weeks   Status New     SLP SHORT TERM GOAL #2   Title pt will complete dysphagia HEP with occasional min A over two sessions   Baseline not provided yet   Time 4   Period Weeks   Status New     SLP SHORT TERM GOAL #3   Title pt will report less frequent overt s/s aspiration at home   Baseline 2-3 times per meal average   Time 4   Period Weeks   Status New     SLP SHORT TERM GOAL #4   Title pt will  demo dysarthria compensations in 15/20 sentence responses   Baseline not provided yet   Time 4   Period Weeks   Status New          SLP Long Term Goals - 03/14/16 0959      SLP LONG TERM GOAL #1   Title pt will complete HEP for dysarthria with rare min A over two sessions   Baseline not provided yet   Time 8   Period Weeks   Status New     SLP LONG TERM GOAL #2   Title pt will complete HEP for dysphagia with rare min A over two sessions   Baseline not provided yet   Time 8   Period Weeks   Status New     SLP LONG TERM GOAL #3   Title pt will follow swallow precautions with POs with modified independence over two sessions   Baseline will occur after objective swallow assessment   Time 8   Period Weeks   Status New     SLP LONG TERM GOAL #4   Title pt will demo speech intelligibliity compensations with occasional min A in 10 minutes simple to mod complex conversation   Baseline not provided yet   Time 8   Period Weeks   Status New          Plan - 03/14/16 4098    Clinical Impression Statement Pt with multiple deficits requiring ST - dysphagia (I69.191), dysarthria (J19.147), and cognitive-linguistics (I69.31), 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 Pt was discharged from the hospital on 03-01-16. For the sake of efficiency ST will focus on dysphagia and dysarthria during this course of therapy. Pt agrees. She will benefit from skilled ST in order to improve swallow skills to allow her a more liberal diet, as well as to improve her speech  intelligibility to help communication with family and community members.   Speech Therapy Frequency 2x / week   Duration --  8 weeks recommended, however due to [facility] regulations pt would like to have 8 ST visits at once per week, only.   Treatment/Interventions Aspiration precaution training;Pharyngeal strengthening exercises;Diet toleration management by SLP;Oral motor exercises;Compensatory strategies;Functional tasks;Cueing hierarchy;Patient/family education;Multimodal communcation approach;Internal/external aids;SLP instruction and feedback   Potential to Achieve Goals Fair   Potential Considerations Previous level of function;Severity of impairments;Financial resources      Patient will benefit from skilled therapeutic intervention in order to improve the following deficits and impairments:   Dysarthria and anarthria  Dysphagia, oropharyngeal phase  Cognitive communication deficit    Problem List Patient Active Problem List   Diagnosis Date Noted  . History of stroke   . History of intracerebral hemorrhage without residual deficit   . Cerebrovascular accident (CVA) (HCC) 02/28/2016  . Ischemic stroke (HCC) 02/28/2016  . Acute kidney injury (HCC) 10/28/2015  . Acute encephalopathy 10/28/2015  . Diastolic dysfunction   . Stroke (cerebrum) (HCC)   . Focal infarction of brain (HCC) 10/08/2015  . Syncope 08/19/2015  . Hypertensive emergency 07/29/2015  . Hypertensive urgency 07/28/2015  . Hypokalemia 07/28/2015  . Accelerated hypertension 09/21/2014  . Type 2 diabetes mellitus with other circulatory complications 09/21/2014  . Hyperlipidemia 09/21/2014  . 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 Hyperlipidemia LDL goal <70 07/29/2014  . Hemorrhagic stroke (HCC) 06/22/2014  . ICH (intracerebral hemorrhage) (HCC) 06/22/2014  . Essential hypertension 05/13/2012  . Diabetes mellitus (HCC) 05/13/2012    Ahmaya Ostermiller ,MS, CCC-SLP  03/14/2016, 10:42 AM  [facility] [address] [place], [place], [address] Phone: [phone]   Fax:  [phone]  Name: Manfred ShirtsMarva N Lloyd MRN: [id number] Date of Birth: 03/19/1970

## 2016-03-15 ENCOUNTER — Other Ambulatory Visit (HOSPITAL_COMMUNITY): Payer: Self-pay | Admitting: Family Medicine

## 2016-03-15 ENCOUNTER — Other Ambulatory Visit (INDEPENDENT_AMBULATORY_CARE_PROVIDER_SITE_OTHER): Payer: Medicaid Other

## 2016-03-15 DIAGNOSIS — D509 Iron deficiency anemia, unspecified: Secondary | ICD-10-CM | POA: Diagnosis not present

## 2016-03-15 LAB — CBC WITH DIFFERENTIAL/PLATELET
BASOS ABS: 0.1 10*3/uL (ref 0.0–0.1)
BASOS PCT: 1 % (ref 0.0–3.0)
EOS ABS: 0.2 10*3/uL (ref 0.0–0.7)
Eosinophils Relative: 3.1 % (ref 0.0–5.0)
HEMATOCRIT: 36.2 % (ref 36.0–46.0)
HEMOGLOBIN: 11.6 g/dL — AB (ref 12.0–15.0)
LYMPHS PCT: 24.4 % (ref 12.0–46.0)
Lymphs Abs: 1.6 10*3/uL (ref 0.7–4.0)
MCHC: 32 g/dL (ref 30.0–36.0)
MCV: 83.1 fl (ref 78.0–100.0)
MONO ABS: 0.5 10*3/uL (ref 0.1–1.0)
Monocytes Relative: 8.3 % (ref 3.0–12.0)
Neutro Abs: 4.1 10*3/uL (ref 1.4–7.7)
Neutrophils Relative %: 63.2 % (ref 43.0–77.0)
Platelets: 263 10*3/uL (ref 150.0–400.0)
RBC: 4.36 Mil/uL (ref 3.87–5.11)
RDW: 13.8 % (ref 11.5–15.5)
WBC: 6.4 10*3/uL (ref 4.0–10.5)

## 2016-03-15 NOTE — Telephone Encounter (Signed)
Ok to reorder

## 2016-03-18 ENCOUNTER — Ambulatory Visit (HOSPITAL_COMMUNITY)
Admission: RE | Admit: 2016-03-18 | Discharge: 2016-03-18 | Disposition: A | Discharge status: Home / Self Care | Payer: Medicaid Other | Source: Ambulatory Visit | Attending: Family Medicine | Admitting: Family Medicine

## 2016-03-18 ENCOUNTER — Other Ambulatory Visit: Payer: Self-pay | Admitting: Family Medicine

## 2016-03-18 DIAGNOSIS — I10 Essential (primary) hypertension: Secondary | ICD-10-CM | POA: Diagnosis not present

## 2016-03-18 DIAGNOSIS — E119 Type 2 diabetes mellitus without complications: Secondary | ICD-10-CM | POA: Diagnosis not present

## 2016-03-18 DIAGNOSIS — R1312 Dysphagia, oropharyngeal phase: Secondary | ICD-10-CM | POA: Diagnosis not present

## 2016-03-18 DIAGNOSIS — R1310 Dysphagia, unspecified: Secondary | ICD-10-CM

## 2016-03-18 DIAGNOSIS — Z8673 Personal history of transient ischemic attack (TIA), and cerebral infarction without residual deficits: Secondary | ICD-10-CM | POA: Insufficient documentation

## 2016-03-18 DIAGNOSIS — T17308A Unspecified foreign body in larynx causing other injury, initial encounter: Secondary | ICD-10-CM

## 2016-03-18 DIAGNOSIS — D649 Anemia, unspecified: Secondary | ICD-10-CM

## 2016-03-18 DIAGNOSIS — D849 Immunodeficiency, unspecified: Secondary | ICD-10-CM | POA: Insufficient documentation

## 2016-03-18 NOTE — Telephone Encounter (Signed)
IFOB entered as instructed

## 2016-03-19 ENCOUNTER — Encounter: Payer: Self-pay | Admitting: Physical Therapy

## 2016-03-19 ENCOUNTER — Ambulatory Visit: Payer: Medicaid Other | Admitting: *Deleted

## 2016-03-19 ENCOUNTER — Ambulatory Visit: Payer: Medicaid Other | Admitting: Physical Therapy

## 2016-03-19 ENCOUNTER — Encounter: Payer: Self-pay | Admitting: *Deleted

## 2016-03-19 DIAGNOSIS — R41841 Cognitive communication deficit: Secondary | ICD-10-CM

## 2016-03-19 DIAGNOSIS — M6281 Muscle weakness (generalized): Secondary | ICD-10-CM

## 2016-03-19 DIAGNOSIS — I69354 Hemiplegia and hemiparesis following cerebral infarction affecting left non-dominant side: Secondary | ICD-10-CM

## 2016-03-19 DIAGNOSIS — R2689 Other abnormalities of gait and mobility: Secondary | ICD-10-CM

## 2016-03-19 DIAGNOSIS — R471 Dysarthria and anarthria: Secondary | ICD-10-CM

## 2016-03-19 DIAGNOSIS — I69351 Hemiplegia and hemiparesis following cerebral infarction affecting right dominant side: Secondary | ICD-10-CM | POA: Diagnosis not present

## 2016-03-19 NOTE — Therapy (Signed)
[facility] [address] [place], [place], [address] Phone: [phone]   Fax:  [phone]  Physical Therapy Treatment  Patient Details  Name: Stephanie Lloyd MRN: [id number] Date of Birth: [birth year] Referring Provider: Donato Schultz, DO  Encounter Date: 03/19/2016      PT End of Session - 03/19/16 1234    Visit Number 1  session cancelled-did not change visit number   Number of Visits 9   Date for PT Re-Evaluation 05/27/16   Authorization Type MCD (awaiting approval)   PT Start Time 1232   PT Stop Time 1250  no charge, did not treat due to high BP   PT Time Calculation (min) 18 min   Activity Tolerance Treatment limited secondary to medical complications (Comment);Patient tolerated treatment well   Behavior During Therapy [facility] for tasks assessed/performed      Past Medical History:  Diagnosis Date  . Diabetes (HCC)   . Diabetes mellitus without complication (HCC)   . Diastolic dysfunction   . Hypertension   . Immune deficiency disorder (HCC)   . Stroke [facility])     Past Surgical History:  Procedure Laterality Date  . NO PAST SURGERIES      There were no vitals filed for this visit.      Subjective Assessment - 03/19/16 1233    Subjective No new complaints. No falls. Reports some back and left should pain.    Patient is accompained by: Family member   Pertinent History Goes by "[employer]" (husband Harvie Heck)    Limitations House hold activities;Walking;Standing   Currently in Pain? Yes   Pain Score 5    Pain Location Back   Pain Orientation Upper;Lower   Pain Descriptors / Indicators Aching;Sore   Pain Type Acute pain   Pain Onset 1 to 4 weeks ago   Pain Frequency Intermittent   Aggravating Factors  unknown   Pain Relieving Factors rest, alleve            [facility] PT Assessment - 03/19/16 1236      6 Minute Walk- Baseline   6 Minute Walk- Baseline yes   BP (mmHg) (!)  180/110   02 Sat  (%RA) 99 %   Modified Borg Scale for Dyspnea 0- Nothing at all   Perceived Rate of Exertion (Borg) 6-     6 Minute walk- Post Test   6 Minute Walk Post Test yes            PT Short Term Goals - 03/14/16 1233      PT SHORT TERM GOAL #1   Title Pt will perform initial HEP with mod I using paper handout to indicate safe compliance, maximize functional gains. Target date: Following 4th visit after eval)   Baseline dependent for current program   Time 4   Period Weeks   Status New     PT SHORT TERM GOAL #2   Title Pt will improve BERG balance score to >52/56 in order to indicate decreased fall risk.     Baseline 46/56 on 03/14/16   Time 4   Period Weeks   Status New     PT SHORT TERM GOAL #3   Title Pt will increase gait speed to 1.59 ft/sec in order to indicate decreased fall risk and improved efficiency of gait.     Baseline .99 ft/sec on 03/14/16   Time 4   Period Weeks   Status New     PT SHORT TERM  GOAL #4   Title Will assess 6MWT and improve 150' from baseline in order to indicate improved functional endurnace.    Baseline did not have time to assess on eval   Time 4   Period Weeks   Status New     PT SHORT TERM GOAL #5   Title Pt will perform floor recovery transfer at mod I level in order to indicate safe transfer from floor and safety getting up from air mattress.     Baseline requires assist from husband and RW at this time   Time 4   Period Weeks   Status New     Additional Short Term Goals   Additional Short Term Goals Yes     PT SHORT TERM GOAL #6   Title Pt will ambulate up to 200' over indoor surfaces with SPC at S level in order to indicate increased independence with household gait.    Baseline S to mod I level with RW over indoor surfaces   Time 4   Period Weeks   Status New     PT SHORT TERM GOAL #7   Title Pt will improve 5TSS time to <20 secs without UE support in order to indicate improved functional endurance.     Baseline 29.50 secs with  UE support on lap           PT Long Term Goals - 03/14/16 1243      PT LONG TERM GOAL #1   Title Pt will be independent with handout with final HEP in order to indicate improved functional mobility.  (Target Date: following 8th visit after eval)   Baseline dependent    Time 8   Period Weeks   Status New     PT LONG TERM GOAL #2   Title Will perform FGA as able and set goal as appropriate to indicate improved balance.     Baseline 46/56 on BERG   Time 8   Period Weeks   Status New     PT LONG TERM GOAL #3   Title Pt will improve gait speed to 2.19 ft/sec in order to indicate decreased fall risk and improved efficiency of gait.     Baseline .99 ft/sec 03/14/16   Time 8   Period Weeks   Status New     PT LONG TERM GOAL #4   Title Pt will ambulate up to 500' over unlevel paved surfaces (including ramp/curb) w/ SPC or LRAD at S level (due to cognition) in order to improve community negotiation.     Baseline not assessed on eval   Time 8   Period Weeks   Status New     PT LONG TERM GOAL #5   Title Pt will improve 6MWT by 300' from baseline in order to indicate improved functional endurance.     Baseline not assessed on eval   Time 8   Period Weeks   Status New     Additional Long Term Goals   Additional Long Term Goals Yes     PT LONG TERM GOAL #6   Title Pt will perform 5TSS in </=15 secs without UE support in order to indicate improved functional strength.     Baseline 29.50 secs with UE support on lap   Time 8   Period Weeks   Status New            Plan - 03/19/16 1234    Clinical Impression Statement Upon checking pt's BP to prep for  6 minute walk test pt's BP was found to be 180/110 manually after 2 attempts with machine providing high results. Call placed to pt's primary MD and appt set for Thurday of this week. Pt reports taking medication as she should and following diet recommendations for heart healthy/low sodium. Pt does not have home monitor, can not  find a place that takes Medicaid to get one. Pt reminded of CVA symptoms and to call 911 if any occur. Session cancelled today due to BP readings.                        Rehab Potential Good   PT Frequency 1x / week   PT Duration 8 weeks   PT Treatment/Interventions ADLs/Self Care Home Management;Electrical Stimulation;DME Instruction;Gait training;Stair training;Functional mobility training;Therapeutic activities;Therapeutic exercise;Balance training;Neuromuscular re-education;Patient/family education;Orthotic Fit/Training;Energy conservation;Vestibular;Visual/perceptual remediation/compensation   PT Next Visit Plan , floor transfer, gait with LRAD (I think she can move away from RW at least in the house), gait mechanics (larger wider steps, increased arm swing), add to HEP for R hip strength   PT Home Exercise Plan See pt instruction from 03/14/16   Consulted and Agree with Plan of Care Patient      Patient will benefit from skilled therapeutic intervention in order to improve the following deficits and impairments:  Abnormal gait, Decreased activity tolerance, Decreased balance, Decreased cognition, Decreased endurance, Decreased knowledge of use of DME, Decreased mobility, Decreased strength, Impaired perceived functional ability, Impaired flexibility, Impaired UE functional use, Improper body mechanics, Impaired vision/preception, Postural dysfunction  Visit Diagnosis: Hemiplegia and hemiparesis following cerebral infarction affecting left non-dominant side (HCC)  Muscle weakness (generalized)  Other abnormalities of gait and mobility     Problem List Patient Active Problem List   Diagnosis Date Noted  . History of stroke   . History of intracerebral hemorrhage without residual deficit   . Cerebrovascular accident (CVA) (HCC) 02/28/2016  . Ischemic stroke (HCC) 02/28/2016  . Acute kidney injury (HCC) 10/28/2015  . Acute encephalopathy 10/28/2015  . Diastolic dysfunction   .  Stroke (cerebrum) (HCC)   . Focal infarction of brain (HCC) 10/08/2015  . Syncope 08/19/2015  . Hypertensive emergency 07/29/2015  . Hypertensive urgency 07/28/2015  . Hypokalemia 07/28/2015  . Accelerated hypertension 09/21/2014  . Type 2 diabetes mellitus with other circulatory complications 09/21/2014  . Hyperlipidemia 09/21/2014  . 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 Hyperlipidemia LDL goal <70 07/29/2014  . Hemorrhagic stroke (HCC) 06/22/2014  . ICH (intracerebral hemorrhage) (HCC) 06/22/2014  . Essential hypertension 05/13/2012  . Diabetes mellitus (HCC) 05/13/2012    Sallyanne Kuster, PTA, [facility] [address], [address] [place], [place] [address] [phone] 03/19/16, 1:09 PM   Name: NATONYA FINSTAD MRN: [id number] Date of Birth: 30-Nov-1970

## 2016-03-19 NOTE — Therapy (Signed)
[facility] [address] [place], [place], [address] Phone: [phone]   Fax:  [phone]  Speech Language Pathology Treatment  Patient Details  Name: Stephanie ShirtsMarva N Lloyd "Stephanie Lloyd" MRN: [id number] Date of Birth: 12/25/1970 Referring Provider: Seabron SpatesLowne Chase, Yvonne, MD  Encounter Date: 03/19/2016      End of Session - 03/19/16 1242    Visit Number 2   Number of Visits 9   Date for SLP Re-Evaluation 05/17/16   Authorization - Visit Number 1   Authorization - Number of Visits 9   SLP Start Time 1145   SLP Stop Time  1230   SLP Time Calculation (min) 45 min   Activity Tolerance Patient tolerated treatment well      Past Medical History:  Diagnosis Date  . Diabetes (HCC)   . Diabetes mellitus without complication (HCC)   . Diastolic dysfunction   . Hypertension   . Immune deficiency disorder (HCC)   . Stroke [facility](HCC)     Past Surgical History:  Procedure Laterality Date  . NO PAST SURGERIES      There were no vitals filed for this visit.      Subjective Assessment - 03/19/16 1221    Subjective Pt reports the rest of yesterday was great, until she got choked on salad at dinner.   Patient is accompained by: Family member  husband Stephanie Lloyd   Currently in Pain? Yes   Pain Score 5    Pain Location Back   Pain Orientation Upper;Lower   Pain Descriptors / Indicators Sore   Pain Type Acute pain   Pain Onset Today   Pain Frequency Intermittent   Aggravating Factors  standing, seating surface   Pain Relieving Factors alleve,    Multiple Pain Sites No               ADULT SLP TREATMENT - 03/19/16 0001      General Information   Behavior/Cognition Alert;Cooperative;Pleasant mood     Treatment Provided   Treatment provided Dysphagia  dysarthria     Dysphagia Treatment   Temperature Spikes Noted No   Respiratory Status Room air   Oral Cavity - Dentition Adequate natural dentition   Treatment Methods  Skilled observation;Therapeutic exercise;Compensation strategy training;Patient/caregiver education   Patient observed directly with PO's No   Other treatment/comments Skilled ST session targeted review of MBS (completed yesterday with this SLP). Pt reported getting choked on salad at dinner. Pt was encouraged to avoid mixed consistencies or particulate solids, minimize distractions, take small bites/sips at a slow rate, eat smaller more frequent meals. Pt also provided with written HEP for dysarthria and dysphagia, and was encouraged to completed HEP at least 2x/day.      Pain Assessment   Pain Assessment 0-10   Pain Score 5    Pain Location --  back   Pain Descriptors / Indicators Sore   Pain Intervention(s) Monitored during session     Assessment / Recommendations / Plan   Plan Continue with current plan of care     Dysphagia Recommendations   Diet recommendations Dysphagia 3 (mechanical soft);Thin liquid   Liquids provided via Cup;Straw   Medication Administration Whole meds with liquid   Supervision Patient able to self feed   Compensations Slow rate;Small sips/bites  minimize distractions   Postural Changes and/or Swallow Maneuvers Seated upright 90 degrees     General Recommendations   Oral Care Recommendations Oral care BID     Progression Toward Goals  Progression toward goals Progressing toward goals          SLP Education - 03/19/16 1241    Education provided Yes   Education Details HEP for dysarthria, dysphagia, compensatory strategies for speech/swallow   Person(s) Educated Patient;Spouse   Methods Explanation;Demonstration;Handout;Verbal cues   Comprehension Verbalized understanding          SLP Short Term Goals - 03/19/16 1245      SLP SHORT TERM GOAL #1   Title pt will complete dysarthria HEP with occasional min A over two sessions   Baseline 1:1 verbal cues required for proper technique   Time 3   Period Weeks   Status On-going     SLP SHORT  TERM GOAL #2   Title pt will complete dysphagia HEP with occasional min A over two sessions   Baseline reviewed HEP and safe swallow precautions with pt/husband.    Time 3   Period Weeks   Status On-going     SLP SHORT TERM GOAL #3   Title pt will report less frequent overt s/s aspiration at home   Baseline 2-3 times per meal average   Time 3   Period Weeks   Status On-going     SLP SHORT TERM GOAL #4   Title pt will demo dysarthria compensations in 15/20 sentence responses   Baseline reviewed and provided written strategies for improvement of clarity of speech   Time 3   Period Weeks   Status On-going          SLP Long Term Goals - 03/19/16 1247      SLP LONG TERM GOAL #1   Title pt will complete HEP for dysarthria with rare min A over two sessions   Baseline reviewed and provided today. 1:1 cues for proper technique   Time 7   Period Weeks   Status On-going     SLP LONG TERM GOAL #2   Title pt will complete HEP for dysphagia with rare min A over two sessions   Baseline reviewed and provided today. 1:1 cues for proper technique   Time 7   Period Weeks   Status On-going     SLP LONG TERM GOAL #3   Title pt will follow swallow precautions with POs with modified independence over two sessions   Baseline MBS results and recommendations, safe swallow precautions and diet recommendations reviewed and provided in written form 03/19/16   Time 7   Period Weeks   Status On-going     SLP LONG TERM GOAL #4   Title pt will demo speech intelligibliity compensations with occasional min A in 10 minutes simple to mod complex conversation   Baseline strategies for improved clarity of speech reviewed and provided in written form 03/19/16   Time 7   Period Weeks   Status On-going          Plan - 03/19/16 1243    Clinical Impression Statement Pt and husband receptive to review of safe swallow precautions per MBS, review and practice of HEP for speech and swallowing. Continued ST  intervention is recommended for education and improvement in swallow safety and speech intelligibility.   Speech Therapy Frequency 1x /week   Duration --  8 weeks   Treatment/Interventions Aspiration precaution training;Pharyngeal strengthening exercises;Diet toleration management by SLP;Oral motor exercises;Compensatory strategies;Functional tasks;Cueing hierarchy;Patient/family education;Multimodal communcation approach;Internal/external aids;SLP instruction and feedback;Compensatory techniques   Potential to Achieve Goals Fair   Potential Considerations Previous level of function;Severity of impairments;Financial resources   SLP  Home Exercise Plan reviewed and provided   Consulted and Agree with Plan of Care Patient;Family member/caregiver   Family Member Consulted husband      Patient will benefit from skilled therapeutic intervention in order to improve the following deficits and impairments:   Dysarthria and anarthria  Cognitive communication deficit    Problem List Patient Active Problem List   Diagnosis Date Noted  . History of stroke   . History of intracerebral hemorrhage without residual deficit   . Cerebrovascular accident (CVA) (HCC) 02/28/2016  . Ischemic stroke (HCC) 02/28/2016  . Acute kidney injury (HCC) 10/28/2015  . Acute encephalopathy 10/28/2015  . Diastolic dysfunction   . Stroke (cerebrum) (HCC)   . Focal infarction of brain (HCC) 10/08/2015  . Syncope 08/19/2015  . Hypertensive emergency 07/29/2015  . Hypertensive urgency 07/28/2015  . Hypokalemia 07/28/2015  . Accelerated hypertension 09/21/2014  . Type 2 diabetes mellitus with other circulatory complications 09/21/2014  . Hyperlipidemia 09/21/2014  . 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 Hyperlipidemia LDL goal <70 07/29/2014  . Hemorrhagic stroke (HCC) 06/22/2014  . ICH (intracerebral hemorrhage) (HCC) 06/22/2014  . Essential hypertension 05/13/2012  . Diabetes mellitus (HCC)  05/13/2012   Celia B. Clarkrange, MSP, CCC-SLP  Leigh Aurora 03/19/2016, 12:50 PM  [facility] [address] [place], [place], [address] Phone: [phone]   Fax:  [phone]   Name: GREER WAINRIGHT MRN: [id number] Date of Birth: 12-04-1970

## 2016-03-21 ENCOUNTER — Ambulatory Visit: Payer: Medicaid Other | Admitting: *Deleted

## 2016-03-21 ENCOUNTER — Ambulatory Visit (INDEPENDENT_AMBULATORY_CARE_PROVIDER_SITE_OTHER): Payer: Medicaid Other | Admitting: Family Medicine

## 2016-03-21 ENCOUNTER — Encounter: Payer: Self-pay | Admitting: Family Medicine

## 2016-03-21 VITALS — BP 150/102 | HR 66 | Temp 97.5°F | Resp 16 | Ht 70.0 in | Wt 191.8 lb

## 2016-03-21 DIAGNOSIS — J069 Acute upper respiratory infection, unspecified: Secondary | ICD-10-CM

## 2016-03-21 DIAGNOSIS — E1151 Type 2 diabetes mellitus with diabetic peripheral angiopathy without gangrene: Secondary | ICD-10-CM

## 2016-03-21 DIAGNOSIS — E1159 Type 2 diabetes mellitus with other circulatory complications: Secondary | ICD-10-CM

## 2016-03-21 DIAGNOSIS — E1165 Type 2 diabetes mellitus with hyperglycemia: Secondary | ICD-10-CM

## 2016-03-21 DIAGNOSIS — Z794 Long term (current) use of insulin: Secondary | ICD-10-CM | POA: Diagnosis not present

## 2016-03-21 DIAGNOSIS — I10 Essential (primary) hypertension: Secondary | ICD-10-CM

## 2016-03-21 DIAGNOSIS — B9789 Other viral agents as the cause of diseases classified elsewhere: Secondary | ICD-10-CM

## 2016-03-21 DIAGNOSIS — IMO0002 Reserved for concepts with insufficient information to code with codable children: Secondary | ICD-10-CM

## 2016-03-21 MED ORDER — LEVOCETIRIZINE DIHYDROCHLORIDE 5 MG PO TABS: 5.0000 mg | ORAL_TABLET | Freq: Every evening | ORAL | 1 refills | Status: DC

## 2016-03-21 MED ORDER — FLUTICASONE PROPIONATE 50 MCG/ACT NA SUSP: 2.0000 | Freq: Every day | NASAL | 1 refills | Status: DC

## 2016-03-21 MED ORDER — LISINOPRIL 10 MG PO TABS: 10.0000 mg | ORAL_TABLET | Freq: Every day | ORAL | 2 refills | Status: DC

## 2016-03-21 NOTE — Progress Notes (Signed)
Pre visit review using our clinic review tool, if applicable. No additional management support is needed unless otherwise documented below in the visit note. 

## 2016-03-21 NOTE — Progress Notes (Signed)
Subjective:  I acted as a Education administrator for Dr. Carollee Herter.  Guerry Bruin, [place]   Patient ID: Stephanie Lloyd, female    DOB: 02-Oct-1970, 46 y.o.   MRN: [id number]  I acted as a Education administrator for Dr. Carollee Herter.  Guerry Bruin, CMA   Chief Complaint  Patient presents with  . Hypertension  . Nasal Congestion    HPI Patient is in today for follow up high blood pressure and nasal congestion.  Nasal congestion started about a month ago.  Has not taken anything over the counter for it.  She was in the hospital 1/31 for cva Pt now has ins for meds and ov visits.    Past Medical History:  Diagnosis Date  . Diabetes ([place])   . Diabetes mellitus without complication ([place])   . Diastolic dysfunction   . Hypertension   . Immune deficiency disorder ([place])   . Stroke [facility])     Past Surgical History:  Procedure Laterality Date  . NO PAST SURGERIES      No family history on file.  Social History   Social History  . Marital status: Married    Spouse name: N/A  . Number of children: N/A  . Years of education: N/A   Occupational History  . Not on file.   Social History Main Topics  . Smoking status: Never Smoker  . Smokeless tobacco: Never Used  . Alcohol use No     Comment: occ  . Drug use: No  . Sexual activity: Yes    Birth control/ protection: None   Other Topics Concern  . Not on file   Social History Narrative   ** Merged History Encounter **        Outpatient Medications Prior to Visit  Medication Sig Dispense Refill  . ACCU-CHEK FASTCLIX LANCETS MISC Check blood sugar twice daily. Dx E:11.9 (Patient taking differently: Check blood sugar twice daily. Dx E:11.9) 102 each 12  . aspirin EC 325 MG EC tablet Take 1 tablet (325 mg total) by mouth daily. 30 tablet 11  . atorvastatin (LIPITOR) 80 MG tablet Take 1 tablet (80 mg total) by mouth daily at 6 PM. 30 tablet 3  . Blood Glucose Monitoring Suppl (ACCU-CHEK GUIDE) w/Device KIT 1 Device by Does not apply route as directed. Check blood  sugar daily 1 kit 0  . glucose blood (ACCU-CHEK GUIDE) test strip Check blood sugar twice daily. Dx E:11.9 100 each 12  . insulin NPH-regular Human (NOVOLIN 70/30 RELION) (70-30) 100 UNIT/ML injection Inject 15 Units into the skin 2 (two) times daily with a meal. (Patient taking differently: Inject 20 Units into the skin 2 (two) times daily with a meal. ) 10 mL 2  . lisinopril (PRINIVIL,ZESTRIL) 5 MG tablet Take 1 tablet (5 mg total) by mouth daily. 30 tablet 3  . UNABLE TO FIND Outpatient physical therapy   Diagnosis: stroke, physical deconditioning 1 Mutually Defined 0   No facility-administered medications prior to visit.     Allergies  Allergen Reactions  . Metformin And Related Diarrhea    Review of Systems  Constitutional: Negative for fever and malaise/fatigue.  HENT: Negative for congestion.   Eyes: Negative for blurred vision.  Respiratory: Negative for cough and shortness of breath.   Cardiovascular: Negative for chest pain, palpitations and leg swelling.  Gastrointestinal: Negative for vomiting.  Musculoskeletal: Negative for back pain.  Skin: Negative for rash.  Neurological: Negative for loss of consciousness and headaches.       Objective:  Physical Exam  Constitutional: She is oriented to person, place, and time. She appears well-developed and well-nourished. No distress.  HENT:  Head: Normocephalic and atraumatic.  Eyes: Conjunctivae are normal.  Neck: Normal range of motion. No thyromegaly present.  Cardiovascular: Normal rate and regular rhythm.   Pulmonary/Chest: Effort normal and breath sounds normal. She has no wheezes.  Abdominal: Soft. Bowel sounds are normal. There is no tenderness.  Musculoskeletal: Normal range of motion. She exhibits no edema or deformity.  Neurological: She is alert and oriented to person, place, and time.  5/5 strength in upper and lower ext  Skin: Skin is warm and dry. She is not diaphoretic.  Psychiatric: She has a normal  mood and affect.  Nursing note and vitals reviewed.   BP (!) 150/102   Pulse 66   Temp 97.5 F (36.4 C) (Oral)   Resp 16   Ht _0  (1.778 m)   Wt 191 lb 12.8 oz (87 kg)   SpO2 96%   BMI 27.52 kg/m  Wt Readings from Last 3 Encounters:  03/21/16 191 lb 12.8 oz (87 kg)  03/06/16 191 lb 6 oz (86.8 kg)  02/28/16 188 lb 12.8 oz (85.6 kg)     Lab Results  Component Value Date   WBC 6.4 03/15/2016   HGB 11.6 (L) 03/15/2016   HCT 36.2 03/15/2016   PLT 263.0 03/15/2016   GLUCOSE 46 (LL) 03/06/2016   CHOL 170 02/29/2016   TRIG 74 02/29/2016   HDL 56 02/29/2016   LDLCALC 99 02/29/2016   ALT 16 03/06/2016   AST 18 03/06/2016   NA 139 03/06/2016   K 4.1 03/06/2016   CL 106 03/06/2016   CREATININE 1.21 (H) 03/06/2016   BUN 21 03/06/2016   CO2 27 03/06/2016   TSH 0.755 03/01/2016   INR 1.26 10/28/2015   HGBA1C 6.9 (H) 02/29/2016    Lab Results  Component Value Date   TSH 0.755 03/01/2016   Lab Results  Component Value Date   WBC 6.4 03/15/2016   HGB 11.6 (L) 03/15/2016   HCT 36.2 03/15/2016   MCV 83.1 03/15/2016   PLT 263.0 03/15/2016   Lab Results  Component Value Date   NA 139 03/06/2016   K 4.1 03/06/2016   CO2 27 03/06/2016   GLUCOSE 46 (LL) 03/06/2016   BUN 21 03/06/2016   CREATININE 1.21 (H) 03/06/2016   BILITOT 0.2 03/06/2016   ALKPHOS 66 03/06/2016   AST 18 03/06/2016   ALT 16 03/06/2016   PROT 6.9 03/06/2016   ALBUMIN 3.9 03/06/2016   CALCIUM 9.1 03/06/2016   ANIONGAP 8 03/01/2016   GFR 61.11 03/06/2016   Lab Results  Component Value Date   CHOL 170 02/29/2016   Lab Results  Component Value Date   HDL 56 02/29/2016   Lab Results  Component Value Date   LDLCALC 99 02/29/2016   Lab Results  Component Value Date   TRIG 74 02/29/2016   Lab Results  Component Value Date   CHOLHDL 3.0 02/29/2016   Lab Results  Component Value Date   HGBA1C 6.9 (H) 02/29/2016       Assessment & Plan:   Problem List Items Addressed This Visit        Unprioritized   Accelerated hypertension    Not controlled Inc lisinopril to 10 mg  rto 2-3 weeks      Relevant Medications   lisinopril (PRINIVIL,ZESTRIL) 10 MG tablet   Diabetes mellitus ([place])    con't meds  Recheck labs 3 months      Relevant Medications   lisinopril (PRINIVIL,ZESTRIL) 10 MG tablet   Upper respiratory infection    con't flonase  Can use otc antihistamine Stay away from decongestants       Other Visit Diagnoses    DM (diabetes mellitus) type II uncontrolled, periph vascular disorder ([place])    -  Primary   Relevant Medications   lisinopril (PRINIVIL,ZESTRIL) 10 MG tablet   Other Relevant Orders   Ambulatory referral to Nutrition and Diabetic Education      I have discontinued Ms. Sanchez's UNABLE TO FIND and lisinopril. I am also having her start on lisinopril, levocetirizine, and fluticasone. Additionally, I am having her maintain her ACCU-CHEK GUIDE, ACCU-CHEK FASTCLIX LANCETS, glucose blood, insulin NPH-regular Human, aspirin, and atorvastatin.  Meds ordered this encounter  Medications  . lisinopril (PRINIVIL,ZESTRIL) 10 MG tablet    Sig: Take 1 tablet (10 mg total) by mouth daily.    Dispense:  30 tablet    Refill:  2  . levocetirizine (XYZAL) 5 MG tablet    Sig: Take 1 tablet (5 mg total) by mouth every evening.    Dispense:  30 tablet    Refill:  1  . fluticasone (FLONASE) 50 MCG/ACT nasal spray    Sig: Place 2 sprays into both nostrils daily.    Dispense:  16 g    Refill:  1    CMA served as scribe during this visit. History, Physical and Plan performed by medical provider. Documentation and orders reviewed and attested to.  Ann Held, DO

## 2016-03-21 NOTE — Patient Instructions (Addendum)
Change your insulin to 20 units in the morning and 10 units at bedtime.     Hypertension Hypertension, commonly called high blood pressure, is when the force of blood pumping through your arteries is too strong. Your arteries are the blood vessels that carry blood from your heart throughout your body. A blood pressure reading consists of a higher number over a lower number, such as 110/72. The higher number (systolic) is the pressure inside your arteries when your heart pumps. The lower number (diastolic) is the pressure inside your arteries when your heart relaxes. Ideally you want your blood pressure below 120/80. Hypertension forces your heart to work harder to pump blood. Your arteries may become narrow or stiff. Having untreated or uncontrolled hypertension can cause heart attack, stroke, kidney disease, and other problems. What increases the risk? Some risk factors for high blood pressure are controllable. Others are not. Risk factors you cannot control include:  Race. You may be at higher risk if you are African American.  Age. Risk increases with age.  Gender. Men are at higher risk than women before age 69 years. After age 85, women are at higher risk than men. Risk factors you can control include:  Not getting enough exercise or physical activity.  Being overweight.  Getting too much fat, sugar, calories, or salt in your diet.  Drinking too much alcohol. What are the signs or symptoms? Hypertension does not usually cause signs or symptoms. Extremely high blood pressure (hypertensive crisis) may cause headache, anxiety, shortness of breath, and nosebleed. How is this diagnosed? To check if you have hypertension, your health care provider will measure your blood pressure while you are seated, with your arm held at the level of your heart. It should be measured at least twice using the same arm. Certain conditions can cause a difference in blood pressure between your right and left  arms. A blood pressure reading that is higher than normal on one occasion does not mean that you need treatment. If it is not clear whether you have high blood pressure, you may be asked to return on a different day to have your blood pressure checked again. Or, you may be asked to monitor your blood pressure at home for 1 or more weeks. How is this treated? Treating high blood pressure includes making lifestyle changes and possibly taking medicine. Living a healthy lifestyle can help lower high blood pressure. You may need to change some of your habits. Lifestyle changes may include:  Following the DASH diet. This diet is high in fruits, vegetables, and whole grains. It is low in salt, red meat, and added sugars.  Keep your sodium intake below 2,300 mg per day.  Getting at least 30-45 minutes of aerobic exercise at least 4 times per week.  Losing weight if necessary.  Not smoking.  Limiting alcoholic beverages.  Learning ways to reduce stress. Your health care provider may prescribe medicine if lifestyle changes are not enough to get your blood pressure under control, and if one of the following is true:  You are 5-70 years of age and your systolic blood pressure is above 140.  You are 50 years of age or older, and your systolic blood pressure is above 150.  Your diastolic blood pressure is above 90.  You have diabetes, and your systolic blood pressure is over 140 or your diastolic blood pressure is over 90.  You have kidney disease and your blood pressure is above 140/90.  You have heart disease  and your blood pressure is above 140/90. Your personal target blood pressure may vary depending on your medical conditions, your age, and other factors. Follow these instructions at home:  Have your blood pressure rechecked as directed by your health care provider.  Take medicines only as directed by your health care provider. Follow the directions carefully. Blood pressure medicines  must be taken as prescribed. The medicine does not work as well when you skip doses. Skipping doses also puts you at risk for problems.  Do not smoke.  Monitor your blood pressure at home as directed by your health care provider. Contact a health care provider if:  You think you are having a reaction to medicines taken.  You have recurrent headaches or feel dizzy.  You have swelling in your ankles.  You have trouble with your vision. Get help right away if:  You develop a severe headache or confusion.  You have unusual weakness, numbness, or feel faint.  You have severe chest or abdominal pain.  You vomit repeatedly.  You have trouble breathing. This information is not intended to replace advice given to you by your health care provider. Make sure you discuss any questions you have with your health care provider. Document Released: 01/14/2005 Document Revised: 06/22/2015 Document Reviewed: 11/06/2012 Elsevier Interactive Patient Education  2017 [employer]

## 2016-03-22 ENCOUNTER — Other Ambulatory Visit: Payer: Self-pay

## 2016-03-22 ENCOUNTER — Ambulatory Visit: Payer: Self-pay | Admitting: Family

## 2016-03-22 ENCOUNTER — Ambulatory Visit: Payer: Self-pay

## 2016-03-25 ENCOUNTER — Other Ambulatory Visit (INDEPENDENT_AMBULATORY_CARE_PROVIDER_SITE_OTHER): Payer: Self-pay

## 2016-03-25 DIAGNOSIS — D649 Anemia, unspecified: Secondary | ICD-10-CM

## 2016-03-25 DIAGNOSIS — J069 Acute upper respiratory infection, unspecified: Secondary | ICD-10-CM | POA: Insufficient documentation

## 2016-03-25 LAB — FECAL OCCULT BLOOD, IMMUNOCHEMICAL: FECAL OCCULT BLD: NEGATIVE

## 2016-03-25 NOTE — Assessment & Plan Note (Signed)
Not controlled Inc lisinopril to 10 mg  rto 2-3 weeks

## 2016-03-25 NOTE — Assessment & Plan Note (Signed)
con't flonase  Can use otc antihistamine Stay away from decongestants

## 2016-03-25 NOTE — Assessment & Plan Note (Signed)
con't meds  Recheck labs 3 months

## 2016-03-26 ENCOUNTER — Ambulatory Visit: Payer: Medicaid Other | Admitting: Occupational Therapy

## 2016-03-26 ENCOUNTER — Ambulatory Visit: Payer: Medicaid Other | Admitting: Physical Therapy

## 2016-03-26 ENCOUNTER — Telehealth: Payer: Self-pay | Admitting: Family Medicine

## 2016-03-26 NOTE — Telephone Encounter (Signed)
See result notes. 

## 2016-03-26 NOTE — Telephone Encounter (Signed)
Pt called in returning call for lab results.  °

## 2016-03-28 ENCOUNTER — Ambulatory Visit: Payer: Self-pay

## 2016-03-28 ENCOUNTER — Other Ambulatory Visit: Payer: Self-pay

## 2016-03-28 ENCOUNTER — Ambulatory Visit: Payer: Self-pay | Admitting: Family

## 2016-03-29 ENCOUNTER — Ambulatory Visit: Payer: Medicaid Other | Attending: Family Medicine | Admitting: *Deleted

## 2016-03-29 DIAGNOSIS — I69318 Other symptoms and signs involving cognitive functions following cerebral infarction: Secondary | ICD-10-CM | POA: Insufficient documentation

## 2016-03-29 DIAGNOSIS — R1312 Dysphagia, oropharyngeal phase: Secondary | ICD-10-CM | POA: Insufficient documentation

## 2016-03-29 DIAGNOSIS — M6281 Muscle weakness (generalized): Secondary | ICD-10-CM | POA: Diagnosis present

## 2016-03-29 DIAGNOSIS — R471 Dysarthria and anarthria: Secondary | ICD-10-CM | POA: Diagnosis not present

## 2016-03-29 DIAGNOSIS — R2681 Unsteadiness on feet: Secondary | ICD-10-CM | POA: Diagnosis present

## 2016-03-29 DIAGNOSIS — R2689 Other abnormalities of gait and mobility: Secondary | ICD-10-CM | POA: Insufficient documentation

## 2016-03-29 DIAGNOSIS — R278 Other lack of coordination: Secondary | ICD-10-CM | POA: Insufficient documentation

## 2016-03-29 DIAGNOSIS — I69354 Hemiplegia and hemiparesis following cerebral infarction affecting left non-dominant side: Secondary | ICD-10-CM | POA: Diagnosis present

## 2016-03-29 DIAGNOSIS — R41841 Cognitive communication deficit: Secondary | ICD-10-CM | POA: Insufficient documentation

## 2016-03-29 NOTE — Therapy (Signed)
[facility] [address] [place], [place], [address] Phone: [phone]   Fax:  [phone]  Speech Language Pathology Treatment  Patient Details  Name: Stephanie Lloyd MRN: [id number] Date of Birth: October 19, 1970 Referring Provider: Seabron Spates, MD  Encounter Date: 03/29/2016      End of Session - 03/29/16 1127    Visit Number 3   Number of Visits 9   Date for SLP Re-Evaluation 05/17/16   SLP Start Time 1015   SLP Stop Time  1100   SLP Time Calculation (min) 45 min   Activity Tolerance Patient tolerated treatment well      Past Medical History:  Diagnosis Date  . Diabetes (HCC)   . Diabetes mellitus without complication (HCC)   . Diastolic dysfunction   . Hypertension   . Immune deficiency disorder (HCC)   . Stroke [facility])     Past Surgical History:  Procedure Laterality Date  . NO PAST SURGERIES      There were no vitals filed for this visit.      Subjective Assessment - 03/29/16 1018    Subjective I'm sleepy, but otherwise ok   Currently in Pain? No/denies               ADULT SLP TREATMENT - 03/29/16 0001      General Information   Behavior/Cognition Alert;Cooperative;Pleasant mood     Treatment Provided   Treatment provided Dysphagia  dysarthria     Dysphagia Treatment   Temperature Spikes Noted No   Respiratory Status Room air   Oral Cavity - Dentition Adequate natural dentition   Treatment Methods Skilled observation;Patient/caregiver education   Patient observed directly with PO's Yes   Type of PO's observed Thin liquids   Feeding Able to feed self   Liquids provided via Cup   Other treatment/comments ST session targeted assessment of diet tolerance and adherence to safe swallow strategies. Pt reports continued difficulty with mixed consistencies and particulate foods (salads), and reports difficulty when eating too quickly. Pt was encouraged to follow written strategies,  specifically small bites and sips, chew well, smaller more frequent meals. Overall, pt does report fewer choking episodes at home. SLP and pt reviewed and practiced oropharyngeal strengthening exercises, and SLP answered questions regarding proper form and technique. Pt was encouraged to work up to reps and/or duration of exercise as time and ability allow. She was encouraged not to rush during exercises, that proper form and technique were most important - not number of repetitions or extent of holding. Rationale for each exercise were reviewed with pt as well. Strategies for improving intelligibility were reviewed, including overarticulation and slower rate. Pt was also given specific instruction on diaphragmatic breathing exercises. She was encouraged to complete these exercises daily.      Pain Assessment   Pain Assessment No/denies pain     Assessment / Recommendations / Plan   Plan Continue with current plan of care     Dysphagia Recommendations   Diet recommendations Dysphagia 3 (mechanical soft);Thin liquid   Liquids provided via Cup;Straw   Medication Administration Whole meds with liquid   Supervision Patient able to self feed   Compensations Slow rate;Small sips/bites  minimize distraction, avoid mixed consistencies, eat slowly   Postural Changes and/or Swallow Maneuvers Seated upright 90 degrees     General Recommendations   Oral Care Recommendations Oral care BID     Progression Toward Goals   Progression toward goals Progressing toward goals  SLP Education - 03/29/16 1126    Education provided Yes   Education Details HEP, diaphragmatic breathing, compensatory strategies/exercises for increased intelligibility   Person(s) Educated Patient   Methods Explanation;Demonstration;Handout;Verbal cues   Comprehension Verbalized understanding;Verbal cues required;Need further instruction;Returned demonstration          SLP Short Term Goals - 03/29/16 1129      SLP  SHORT TERM GOAL #1   Title pt will complete dysarthria HEP with occasional min A over two sessions   Baseline 03/29/16   Time 2   Period Weeks   Status On-going     SLP SHORT TERM GOAL #2   Title pt will complete dysphagia HEP with occasional min A over two sessions   Baseline 03/29/16   Time 2   Period Weeks   Status On-going     SLP SHORT TERM GOAL #3   Title pt will report less frequent overt s/s aspiration at home   Baseline 03/29/16   Time 2   Period Weeks   Status On-going     SLP SHORT TERM GOAL #4   Title pt will demo dysarthria compensations in 15/20 sentence responses   Time 2   Period Weeks   Status On-going          SLP Long Term Goals - 03/29/16 1130      SLP LONG TERM GOAL #1   Title pt will complete HEP for dysarthria with rare min A over two sessions   Baseline 03/29/16   Time 6   Period Weeks   Status On-going     SLP LONG TERM GOAL #2   Title pt will complete HEP for dysphagia with rare min A over two sessions   Baseline 03/29/16   Time 6   Period Weeks     SLP LONG TERM GOAL #3   Title pt will follow swallow precautions with POs with modified independence over two sessions   Baseline 03/29/16   Time 6   Period Weeks   Status On-going     SLP LONG TERM GOAL #4   Title pt will demo speech intelligibliity compensations with occasional min A in 10 minutes simple to mod complex conversation   Time 6   Period Weeks   Status On-going          Plan - 03/29/16 1127    Clinical Impression Statement Pt reports continuing to eat too quickly, and eating particulate mixed consistencies, but overall has had fewer choking episodes at home. Pt motivated to improve, and works diligently in therapy, asking appropriate clarifying questions. Continued ST intervention is recommended for improvement of safe swallow, muscular strength and intelligibility ot speech.   Speech Therapy Frequency 1x /week   Duration --  8 weeks   Treatment/Interventions Aspiration  precaution training;Pharyngeal strengthening exercises;Diet toleration management by SLP;Oral motor exercises;Compensatory strategies;Functional tasks;Cueing hierarchy;Patient/family education;Multimodal communcation approach;Internal/external aids;SLP instruction and feedback;Compensatory techniques   Potential to Achieve Goals Good   Potential Considerations Previous level of function;Severity of impairments;Financial resources   SLP Home Exercise Plan reviewed and provided   Consulted and Agree with Plan of Care Patient      Patient will benefit from skilled therapeutic intervention in order to improve the following deficits and impairments:   Dysarthria and anarthria    Problem List Patient Active Problem List   Diagnosis Date Noted  . Upper respiratory infection 03/25/2016  . History of stroke   . History of intracerebral hemorrhage without residual deficit   .  Cerebrovascular accident (CVA) (HCC) 02/28/2016  . Ischemic stroke (HCC) 02/28/2016  . Acute kidney injury (HCC) 10/28/2015  . Acute encephalopathy 10/28/2015  . Diastolic dysfunction   . Stroke (cerebrum) (HCC)   . Focal infarction of brain (HCC) 10/08/2015  . Syncope 08/19/2015  . Hypertensive emergency 07/29/2015  . Hypertensive urgency 07/28/2015  . Hypokalemia 07/28/2015  . Accelerated hypertension 09/21/2014  . Type 2 diabetes mellitus with other circulatory complications 09/21/2014  . Hyperlipidemia 09/21/2014  . 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 Hyperlipidemia LDL goal <70 07/29/2014  . Hemorrhagic stroke (HCC) 06/22/2014  . ICH (intracerebral hemorrhage) (HCC) 06/22/2014  . Essential hypertension 05/13/2012  . Diabetes mellitus (HCC) 05/13/2012   Celia B. Alice AcresBueche, MSP, CCC-SLP  Leigh AuroraBueche, Celia Brown 03/29/2016, 11:31 AM  [facility] [address] [place], [place], [address] Phone: [phone]   Fax:  [phone]   Name:  Manfred ShirtsMarva N Vanamburg MRN: [id number] Date of Birth: 08/12/1970

## 2016-04-01 ENCOUNTER — Telehealth: Payer: Self-pay | Admitting: Family Medicine

## 2016-04-01 ENCOUNTER — Encounter: Payer: Self-pay | Admitting: Family

## 2016-04-01 ENCOUNTER — Ambulatory Visit (HOSPITAL_BASED_OUTPATIENT_CLINIC_OR_DEPARTMENT_OTHER): Payer: Medicaid Other

## 2016-04-01 ENCOUNTER — Ambulatory Visit: Payer: Medicaid Other

## 2016-04-01 ENCOUNTER — Ambulatory Visit (HOSPITAL_BASED_OUTPATIENT_CLINIC_OR_DEPARTMENT_OTHER): Payer: Medicaid Other | Admitting: Family

## 2016-04-01 ENCOUNTER — Other Ambulatory Visit: Payer: Self-pay | Admitting: Family

## 2016-04-01 ENCOUNTER — Other Ambulatory Visit (HOSPITAL_BASED_OUTPATIENT_CLINIC_OR_DEPARTMENT_OTHER): Payer: Medicaid Other

## 2016-04-01 VITALS — BP 160/88 | HR 58 | Resp 16

## 2016-04-01 DIAGNOSIS — D509 Iron deficiency anemia, unspecified: Secondary | ICD-10-CM

## 2016-04-01 DIAGNOSIS — D649 Anemia, unspecified: Secondary | ICD-10-CM

## 2016-04-01 DIAGNOSIS — N921 Excessive and frequent menstruation with irregular cycle: Secondary | ICD-10-CM | POA: Insufficient documentation

## 2016-04-01 DIAGNOSIS — D500 Iron deficiency anemia secondary to blood loss (chronic): Secondary | ICD-10-CM | POA: Diagnosis not present

## 2016-04-01 HISTORY — DX: Excessive and frequent menstruation with irregular cycle: N92.1

## 2016-04-01 LAB — COMPREHENSIVE METABOLIC PANEL
ALT: 17 U/L (ref 0–55)
ANION GAP: 8 meq/L (ref 3–11)
AST: 15 U/L (ref 5–34)
Albumin: 3.6 g/dL (ref 3.5–5.0)
Alkaline Phosphatase: 88 U/L (ref 40–150)
BUN: 14.7 mg/dL (ref 7.0–26.0)
CALCIUM: 9.4 mg/dL (ref 8.4–10.4)
CHLORIDE: 105 meq/L (ref 98–109)
CO2: 26 mEq/L (ref 22–29)
Creatinine: 1.3 mg/dL — ABNORMAL HIGH (ref 0.6–1.1)
EGFR: 58 mL/min/{1.73_m2} — AB (ref >90)
Glucose: 226 mg/dl — ABNORMAL HIGH (ref 70–140)
POTASSIUM: 4.1 meq/L (ref 3.5–5.1)
Sodium: 139 mEq/L (ref 136–145)
Total Bilirubin: <0.22 mg/dL (ref 0.20–1.20)
Total Protein: 7 g/dL (ref 6.4–8.3)

## 2016-04-01 LAB — FERRITIN: Ferritin: 8 ng/ml — ABNORMAL LOW (ref 9–269)

## 2016-04-01 LAB — CBC WITH DIFFERENTIAL (CANCER CENTER ONLY)
BASO#: 0 10*3/uL (ref 0.0–0.2)
BASO%: 0.6 % (ref 0.0–2.0)
EOS%: 3.5 % (ref 0.0–7.0)
Eosinophils Absolute: 0.3 10*3/uL (ref 0.0–0.5)
HEMATOCRIT: 35.4 % (ref 34.8–46.6)
HGB: 11.5 g/dL — ABNORMAL LOW (ref 11.6–15.9)
LYMPH#: 1.6 10*3/uL (ref 0.9–3.3)
LYMPH%: 21.5 % (ref 14.0–48.0)
MCH: 26.9 pg (ref 26.0–34.0)
MCHC: 32.5 g/dL (ref 32.0–36.0)
MCV: 83 fL (ref 81–101)
MONO#: 0.5 10*3/uL (ref 0.1–0.9)
MONO%: 6.6 % (ref 0.0–13.0)
NEUT#: 4.9 10*3/uL (ref 1.5–6.5)
NEUT%: 67.8 % (ref 39.6–80.0)
Platelets: 198 10*3/uL (ref 145–400)
RBC: 4.28 10*6/uL (ref 3.70–5.32)
RDW: 13.1 % (ref 11.1–15.7)
WBC: 7.2 10*3/uL (ref 3.9–10.0)

## 2016-04-01 LAB — IRON AND TIBC
%SAT: 14 % — AB (ref 21–57)
IRON: 46 ug/dL (ref 41–142)
TIBC: 320 ug/dL (ref 236–444)
UIBC: 274 ug/dL (ref 120–384)

## 2016-04-01 LAB — CHCC SATELLITE - SMEAR

## 2016-04-01 MED ORDER — SODIUM CHLORIDE 0.9 % IV SOLN
Freq: Once | INTRAVENOUS | Status: AC
  Administered 2016-04-01: 11:00:00 via INTRAVENOUS

## 2016-04-01 MED ORDER — SODIUM CHLORIDE 0.9 % IV SOLN
510.0000 mg | Freq: Once | INTRAVENOUS | Status: AC
  Administered 2016-04-01: 510 mg via INTRAVENOUS
  Filled 2016-04-01: qty 17

## 2016-04-01 NOTE — Patient Instructions (Signed)

## 2016-04-01 NOTE — Progress Notes (Signed)
Hematology/Oncology Consultation   Name: Stephanie Lloyd      MRN: [id number]    Location: Room/bed info not found  Date: 04/01/2016 Time:9:48 AM   REFERRING PHYSICIAN: Mackie Pai, PA-C  REASON FOR CONSULT: Iron deficiency anemia   DIAGNOSIS:  1. Iron deficiency anemia  2. History of hemorrhagic stroke x 2 and ischemic stroke x 1   HISTORY OF PRESENT ILLNESS: Stephanie Lloyd is a very pleasant 46 yo African American female with recently diagnosed iron deficiency anemia. She is symptomatic at this time with fatigued and weakness.  She also had an ischemic stroke in early February and is currently on PT/OT weekly. She is taking 1 baby aspirin daily.  She also has history of 2 hemorrhagic strokes. She has battled uncontrolled HTN which likely contributed to her CVAs. She is currently on lisinopril 10 mg PO daily and BP today is 150/89.   Sh also has poorly controlled diabetes. Hgb A1c last month was 6.9.  She states that she was checked for lupus and was negative.  Her cycles are heavy and she often has more than one a month. hemoccult test on 2/26 was negative.  She denies having had any type of abdominal surgery. She has one son (developed gestational diabetes) and no history of miscarriage.  No family history of anemia, no sickle cell disease or trait.  She has had no issue with frequent infections. No fever, chills, n/v, cough, rash, dizziness, SOB, chest pain, palpitations, abdominal pain or changes in bowel or bladder habits.  She has had some dysphagia with the stroke and is working with speech therapy. She has not aspirated but does have some difficulty with fluids.  She has maintained a good appetite and is doing her best to stay hydrated. She states that her weight is stable.  She has neuropathy in her feet and hands. No swelling in her extremities. She is equally weak on both sides.  She states that her last colonoscopy was within the last several years and was negative.  She is due  for her mammogram and will schedule this once things settle down with her therapy.  She is not currently working but previously was in [employer].   ROS: All other 10 point review of systems is negative.   PAST MEDICAL HISTORY:   Past Medical History:  Diagnosis Date  . Diabetes ([place])   . Diabetes mellitus without complication ([place])   . Diastolic dysfunction   . Hypertension   . Immune deficiency disorder ([place])   . Stroke [facility])     ALLERGIES: Allergies  Allergen Reactions  . Metformin And Related Diarrhea      MEDICATIONS:  Current Outpatient Prescriptions on File Prior to Visit  Medication Sig Dispense Refill  . ACCU-CHEK FASTCLIX LANCETS MISC Check blood sugar twice daily. Dx E:11.9 (Patient taking differently: Check blood sugar twice daily. Dx E:11.9) 102 each 12  . aspirin EC 325 MG EC tablet Take 1 tablet (325 mg total) by mouth daily. 30 tablet 11  . atorvastatin (LIPITOR) 80 MG tablet Take 1 tablet (80 mg total) by mouth daily at 6 PM. 30 tablet 3  . Blood Glucose Monitoring Suppl (ACCU-CHEK GUIDE) w/Device KIT 1 Device by Does not apply route as directed. Check blood sugar daily 1 kit 0  . fluticasone (FLONASE) 50 MCG/ACT nasal spray Place 2 sprays into both nostrils daily. 16 g 1  . glucose blood (ACCU-CHEK GUIDE) test strip Check blood sugar twice daily. Dx E:11.9 100 each 12  .  insulin NPH-regular Human (NOVOLIN 70/30 RELION) (70-30) 100 UNIT/ML injection Inject 15 Units into the skin 2 (two) times daily with a meal. (Patient taking differently: Inject 20 Units into the skin 2 (two) times daily with a meal. ) 10 mL 2  . levocetirizine (XYZAL) 5 MG tablet Take 1 tablet (5 mg total) by mouth every evening. 30 tablet 1  . lisinopril (PRINIVIL,ZESTRIL) 10 MG tablet Take 1 tablet (10 mg total) by mouth daily. 30 tablet 2   No current facility-administered medications on file prior to visit.      PAST SURGICAL HISTORY Past Surgical History:  Procedure Laterality Date  . NO  PAST SURGERIES      FAMILY HISTORY: No family history on file.  SOCIAL HISTORY:  reports that she has never smoked. She has never used smokeless tobacco. She reports that she does not drink alcohol or use drugs.  PERFORMANCE STATUS: The patient's performance status is 1 - Symptomatic but completely ambulatory  PHYSICAL EXAM: Most Recent Vital Signs: Blood pressure (!) 150/89, pulse 70, temperature 98.2 F (36.8 C), temperature source Oral, resp. rate 16, weight 192 lb (87.1 kg), SpO2 100 %. BP (!) 150/89 (BP Location: Left Arm, Patient Position: Sitting)   Pulse 70   Temp 98.2 F (36.8 C) (Oral)   Resp 16   Wt 192 lb (87.1 kg)   SpO2 100%   BMI 27.55 kg/m   General Appearance:    Alert, cooperative, no distress, appears stated age  Head:    Normocephalic, without obvious abnormality, atraumatic  Eyes:    PERRL, conjunctiva/corneas clear, EOM's intact, fundi    benign, both eyes        Throat:   Lips, mucosa, and tongue normal; teeth and gums normal  Neck:   Supple, symmetrical, trachea midline, no adenopathy;    thyroid:  no enlargement/tenderness/nodules; no carotid   bruit or JVD  Back:     Symmetric, no curvature, ROM normal, no CVA tenderness  Lungs:     Clear to auscultation bilaterally, respirations unlabored  Chest Wall:    No tenderness or deformity   Heart:    Regular rate and rhythm, S1 and S2 normal, no murmur, rub   or gallop     Abdomen:     Soft, non-tender, bowel sounds active all four quadrants,    no masses, no organomegaly        Extremities:   Extremities normal, atraumatic, no cyanosis or edema  Pulses:   2+ and symmetric all extremities  Skin:   Skin color, texture, turgor normal, no rashes or lesions  Lymph nodes:   Cervical, supraclavicular, and axillary nodes normal  Neurologic:   CNII-XII intact, bilateral weakness with stroke, sensation and reflexes throughout    LABORATORY DATA:  Results for orders placed or performed in visit on 04/01/16  (from the past 48 hour(s))  CBC w/Diff     Status: Abnormal   Collection Time: 04/01/16  8:53 AM  Result Value Ref Range   WBC 7.2 3.9 - 10.0 10e3/uL   RBC 4.28 3.70 - 5.32 10e6/uL   HGB 11.5 (L) 11.6 - 15.9 g/dL   HCT 35.4 34.8 - 46.6 %   MCV 83 81 - 101 fL   MCH 26.9 26.0 - 34.0 pg   MCHC 32.5 32.0 - 36.0 g/dL   RDW 13.1 11.1 - 15.7 %   Platelets 198 145 - 400 10e3/uL   NEUT# 4.9 1.5 - 6.5 10e3/uL   LYMPH# 1.6 0.9 -  3.3 10e3/uL   MONO# 0.5 0.1 - 0.9 10e3/uL   Eosinophils Absolute 0.3 0.0 - 0.5 10e3/uL   BASO# 0.0 0.0 - 0.2 10e3/uL   NEUT% 67.8 39.6 - 80.0 %   LYMPH% 21.5 14.0 - 48.0 %   MONO% 6.6 0.0 - 13.0 %   EOS% 3.5 0.0 - 7.0 %   BASO% 0.6 0.0 - 2.0 %  Smear     Status: None   Collection Time: 04/01/16  8:53 AM  Result Value Ref Range   Smear Result Smear Available       RADIOGRAPHY: No results found.     PATHOLOGY: None  ASSESSMENT/PLAN: Ms. Osburn is a very pleasant 46 yo African American female with iron deficiency anemia. She is symptomatic at this time with fatigued and weakness.  Iron saturation was 3% and ferritin was 12. Hgb is stable at 11.5 with an MCV of 83.  She has history of both hemorrhagic and ischemic stroke (uncontrolled HTN and diabetes) with the later occurring last month. She is currently in rehab and doing well. She has residual weakness on both sides as a result.  We will give her iron today and a second dose in 8 days.  We will then plan to see her back in 6 weeks for follow-up and repeat lab work.  All questions were answered. She will contact our office with any problems, questions or concerns. We can certainly see her much sooner if necessary.  She was discussed with and also seen by Dr. Marin Olp and he is in agreement with the aforementioned.   [facility] M    Addendum:  I agree with the above note by Judson Roch. I saw and examined the patient with Judson Roch. I knew that I recognized her husband. He has sickle cell disease and has been  hospitalized at [facility] on several occasions.  She does not have sickle cell.  She does have a lot of bleeding from her monthly cycles.  She clearly has iron deficiency.  I looked at her blood under the microscope. She has microcytic and hypochromic red blood cells. I don't see any nucleated red blood cells. There are no schistocytes. White cells and platelets looked normal.  She really is not that anemic.  I'm impressed by the fact that she has had the cerebrovascular events. I know that she has high blood pressure and diabetes. However, it is still unusual to see somebody young he was had several cerebrovascular accidents.  I don't think she has a myeloproliferative disorder. I do not see anything on her blood smear that looked like she had a hyperactive marrow.  We'll go ahead and give her iron. I know that this will work.  I will like to see her back in about 4 weeks or so. We will see how she is feeling. She clearly is asymptomatic from the iron deficiency.  We spent about 40 minutes with her this morning. We answered all of their questions.  Lattie Haw, MD

## 2016-04-01 NOTE — Telephone Encounter (Signed)
Paperwork received.  Filled in as much as possible and forwarded to PCP for review and signature.

## 2016-04-01 NOTE — Telephone Encounter (Signed)
Pt's husband dropped off a disability form for Dr.lowne to complete and fax, documents placed in tray at front office

## 2016-04-02 ENCOUNTER — Ambulatory Visit: Payer: Medicaid Other | Admitting: Physical Therapy

## 2016-04-02 ENCOUNTER — Ambulatory Visit: Payer: Medicaid Other

## 2016-04-02 ENCOUNTER — Ambulatory Visit: Payer: Medicaid Other | Admitting: Occupational Therapy

## 2016-04-02 VITALS — BP 176/114

## 2016-04-02 DIAGNOSIS — I69354 Hemiplegia and hemiparesis following cerebral infarction affecting left non-dominant side: Secondary | ICD-10-CM

## 2016-04-02 LAB — RETICULOCYTES: Reticulocyte Count: 1.1 % (ref 0.6–2.6)

## 2016-04-02 LAB — ERYTHROPOIETIN: Erythropoietin: 20.3 m[IU]/mL — ABNORMAL HIGH (ref 2.6–18.5)

## 2016-04-02 NOTE — Therapy (Signed)
[facility] [address] [place], [place], [address] Phone: [phone]   Fax:  [phone]  Speech Language Pathology Treatment  Patient Details  Name: Manfred ShirtsMarva N Aho MRN: [id number] Date of Birth: 05/10/1970 Referring Provider: Seabron SpatesLowne Chase, Yvonne, MD  Encounter Date: 04/02/2016     Past Medical History:  Diagnosis Date  . Diabetes (HCC)   . Diabetes mellitus without complication (HCC)   . Diastolic dysfunction   . Hypertension   . Immune deficiency disorder (HCC)   . Menometrorrhagia 04/01/2016  . Stroke [facility](HCC)     Past Surgical History:  Procedure Laterality Date  . NO PAST SURGERIES      There were no vitals filed for this visit.      Pt arrived with BP 176/114. Pt denied headache, changes in vision, changes in speech, or increased numbness and weakness. No current signs of CVA with OT today.  Pt was advised not to stay for OT or ST and to go home and limit all physical activity.OT told pt to go to ED (via family member or ambulance) if any of overt s/s CVA (which OT reviewed with pt) occur. Pt verbalized understanding and agreed.  Pt has appointment with PCP this Friday 04/05/16, OT stated pt agreed to discuss BP with PCP           SLP Short Term Goals - 03/29/16 1129      SLP SHORT TERM GOAL #1   Title pt will complete dysarthria HEP with occasional min A over two sessions   Baseline 03/29/16   Time 2   Period Weeks   Status On-going     SLP SHORT TERM GOAL #2   Title pt will complete dysphagia HEP with occasional min A over two sessions   Baseline 03/29/16   Time 2   Period Weeks   Status On-going     SLP SHORT TERM GOAL #3   Title pt will report less frequent overt s/s aspiration at home   Baseline 03/29/16   Time 2   Period Weeks   Status On-going     SLP SHORT TERM GOAL #4   Title pt will demo dysarthria compensations in 15/20 sentence responses   Time 2   Period Weeks   Status  On-going          SLP Long Term Goals - 03/29/16 1130      SLP LONG TERM GOAL #1   Title pt will complete HEP for dysarthria with rare min A over two sessions   Baseline 03/29/16   Time 6   Period Weeks   Status On-going     SLP LONG TERM GOAL #2   Title pt will complete HEP for dysphagia with rare min A over two sessions   Baseline 03/29/16   Time 6   Period Weeks     SLP LONG TERM GOAL #3   Title pt will follow swallow precautions with POs with modified independence over two sessions   Baseline 03/29/16   Time 6   Period Weeks   Status On-going     SLP LONG TERM GOAL #4   Title pt will demo speech intelligibliity compensations with occasional min A in 10 minutes simple to mod complex conversation   Time 6   Period Weeks   Status On-going        Patient will benefit from skilled therapeutic intervention in order to improve the following deficits and impairments:   Dysarthria and anarthria  Cognitive communication deficit    Problem List Patient Active Problem List   Diagnosis Date Noted  . Iron deficiency anemia due to chronic blood loss 04/01/2016  . Menometrorrhagia 04/01/2016  . Upper respiratory infection 03/25/2016  . History of stroke   . History of intracerebral hemorrhage without residual deficit   . Cerebrovascular accident (CVA) (HCC) 02/28/2016  . Ischemic stroke (HCC) 02/28/2016  . Acute kidney injury (HCC) 10/28/2015  . Acute encephalopathy 10/28/2015  . Diastolic dysfunction   . Stroke (cerebrum) (HCC)   . Focal infarction of brain (HCC) 10/08/2015  . Syncope 08/19/2015  . Hypertensive emergency 07/29/2015  . Hypertensive urgency 07/28/2015  . Hypokalemia 07/28/2015  . Accelerated hypertension 09/21/2014  . Type 2 diabetes mellitus with other circulatory complications 09/21/2014  . Hyperlipidemia 09/21/2014  . 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 Hyperlipidemia LDL goal <70 07/29/2014  . Hemorrhagic stroke (HCC) 06/22/2014   . ICH (intracerebral hemorrhage) (HCC) 06/22/2014  . Essential hypertension 05/13/2012  . Diabetes mellitus (HCC) 05/13/2012    [facility] ,MS, CCC-SLP  04/02/2016, 4:45 PM  [facility] [address] [place], [place], [address] Phone: [phone]   Fax:  [phone]   Name: TACEY DIMAGGIO MRN: [id number] Date of Birth: 1970/12/01

## 2016-04-02 NOTE — Therapy (Signed)
[facility] [address] [place], [place], [address] Phone: [phone]   Fax:  [phone]  Occupational Therapy Treatment  Patient Details  Name: Stephanie Lloyd MRN: [id number] Date of Birth: January 31, 1970 Referring Provider: Dr. Florina Ou  Encounter Date: 04/02/2016      OT End of Session - 04/02/16 1344    Date for OT Re-Evaluation 05/12/16   Authorization Type MCD   Authorization Time Period 03/18/16 - 05/12/16   Authorization - Number of Visits 8   OT Start Time 1315  NO CHARGE   OT Stop Time 1330   OT Time Calculation (min) 15 min      Past Medical History:  Diagnosis Date  . Diabetes (HCC)   . Diabetes mellitus without complication (HCC)   . Diastolic dysfunction   . Hypertension   . Immune deficiency disorder (HCC)   . Menometrorrhagia 04/01/2016  . Stroke [facility])     Past Surgical History:  Procedure Laterality Date  . 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 No/denies            [facility] OT Assessment - 04/02/16 0001      Precautions   Precautions Fall  Swallowing - thickened liquids   Precaution Comments swallowing precautions - thickened liquids,   (has not driven since 2016)         Pt arrived with BP 176/114. Pt denied headache, changes in vision, changes in speech, or increased numbness and weakness. No current signs of CVA.  Therapist reviewed s/s of CVA and instructed pt to go to ED (via family member or ambulance) if any of these signs occur. Pt verbalized understanding and agrees.  Pt reports she has appointment with PCP this Friday 04/05/16. Pt agrees to discuss BP with PCP                       OT Long Term Goals - 03/14/16 1002       OT LONG TERM GOAL #1   Title Independent with coordination HEP and putty HEP    Baseline DEPENDENT   Time 8   Period Weeks   Status New     OT LONG TERM GOAL #2   Title Improve coordination bilaterally as evidenced by reducing speed on 9 hole peg test by 5 sec. or more   Baseline eval: Rt = 43.25 sec, Lt = 45.57 sec   Time 8   Period Weeks   Status New     OT LONG TERM GOAL #3   Title Grip strength Rt dominant hand to be 45 lbs or greater to open jars/containers   Baseline eval = 38 lbs (Lt = 50 lbs)    Time 8   Period Weeks   Status New     OT LONG TERM GOAL #4   Title Pt/family to verbalize understanding with memory compensatory strategies and how to implement    Baseline dependent   Time 8   Period Weeks   Status New     OT LONG TERM GOAL #5   Title Pt to fully return to laundry tasks and consistently preparing snacks, cold meal, microwaveable items  Baseline dependent since recent stroke   Time 8   Period Weeks   Status New     Long Term Additional Goals   Additional Long Term Goals Yes     OT LONG TERM GOAL #6   Title Cognition goal TBD prn    Baseline (based on MOCA score next visit)    Time 8   Period Weeks   Status New               Plan - 04/02/16 1346    Clinical Impression Statement Pt arrived with no charge d/t high blood pressure   Clinical Impairments Affecting Rehab Potential premorbid status (previous CVA's x2), limitied visits with insurance   OT Frequency 1x / week   OT Duration 8 weeks   Plan check BP, if able to be seen - assess MOCA and write cognition goal prn, assess Box & Blocks, begin coordination HEP bilaterally, and putty HEP for Rt hand   Consulted and Agree with Plan of Care Patient      Patient will benefit from skilled therapeutic intervention in order to improve the following deficits and impairments:     Visit Diagnosis: Hemiplegia and hemiparesis following cerebral infarction affecting left non-dominant side  [facility](HCC)    Problem List Patient Active Problem List   Diagnosis Date Noted  . Iron deficiency anemia due to chronic blood loss 04/01/2016  . Menometrorrhagia 04/01/2016  . Upper respiratory infection 03/25/2016  . History of stroke   . History of intracerebral hemorrhage without residual deficit   . Cerebrovascular accident (CVA) (HCC) 02/28/2016  . Ischemic stroke (HCC) 02/28/2016  . Acute kidney injury (HCC) 10/28/2015  . Acute encephalopathy 10/28/2015  . Diastolic dysfunction   . Stroke (cerebrum) (HCC)   . Focal infarction of brain (HCC) 10/08/2015  . Syncope 08/19/2015  . Hypertensive emergency 07/29/2015  . Hypertensive urgency 07/28/2015  . Hypokalemia 07/28/2015  . Accelerated hypertension 09/21/2014  . Type 2 diabetes mellitus with other circulatory complications 09/21/2014  . Hyperlipidemia 09/21/2014  . 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 Hyperlipidemia LDL goal <70 07/29/2014  . Hemorrhagic stroke (HCC) 06/22/2014  . ICH (intracerebral hemorrhage) (HCC) 06/22/2014  . Essential hypertension 05/13/2012  . Diabetes mellitus (HCC) 05/13/2012    Kelli ChurnBallie, Zenia Guest Johnson, OTR/L 04/02/2016, 1:49 PM  [facility] [address] [place], [place], [address] Phone: [phone]   Fax:  [phone]  Name: Manfred ShirtsMarva N Branan MRN: [id number] Date of Birth: 11/11/1970

## 2016-04-03 LAB — HEMOGLOBINOPATHY EVALUATION
HEMOGLOBIN A2 QUANTITATION: 1.8 % (ref 1.8–3.2)
HEMOGLOBIN F QUANTITATION: 0 % (ref 0.0–2.0)
HGB A: 98.2 % (ref 96.4–98.8)
HGB C: 0 %
HGB S: 0 %
HGB VARIANT: 0 %

## 2016-04-05 ENCOUNTER — Encounter: Payer: Self-pay | Admitting: Family Medicine

## 2016-04-05 ENCOUNTER — Ambulatory Visit (INDEPENDENT_AMBULATORY_CARE_PROVIDER_SITE_OTHER): Payer: Medicaid Other | Admitting: Family Medicine

## 2016-04-05 VITALS — BP 148/90 | HR 69 | Temp 97.7°F | Resp 17 | Ht 72.0 in | Wt 191.8 lb

## 2016-04-05 DIAGNOSIS — I10 Essential (primary) hypertension: Secondary | ICD-10-CM

## 2016-04-05 DIAGNOSIS — E785 Hyperlipidemia, unspecified: Secondary | ICD-10-CM

## 2016-04-05 DIAGNOSIS — D500 Iron deficiency anemia secondary to blood loss (chronic): Secondary | ICD-10-CM

## 2016-04-05 DIAGNOSIS — I69854 Hemiplegia and hemiparesis following other cerebrovascular disease affecting left non-dominant side: Secondary | ICD-10-CM | POA: Diagnosis not present

## 2016-04-05 DIAGNOSIS — E1159 Type 2 diabetes mellitus with other circulatory complications: Secondary | ICD-10-CM | POA: Diagnosis not present

## 2016-04-05 DIAGNOSIS — Z794 Long term (current) use of insulin: Secondary | ICD-10-CM | POA: Diagnosis not present

## 2016-04-05 MED ORDER — ATORVASTATIN CALCIUM 80 MG PO TABS: 80.0000 mg | ORAL_TABLET | Freq: Every day | ORAL | 3 refills | Status: DC

## 2016-04-05 MED ORDER — LISINOPRIL 20 MG PO TABS: ORAL_TABLET | ORAL | 3 refills | Status: DC

## 2016-04-05 MED ORDER — LISINOPRIL 20 MG PO TABS
20.0000 mg | ORAL_TABLET | Freq: Every day | ORAL | 3 refills | Status: DC
Start: 2016-04-05 — End: 2016-04-19

## 2016-04-05 NOTE — Assessment & Plan Note (Signed)
con't insulin Check labs in May

## 2016-04-05 NOTE — Assessment & Plan Note (Signed)
Per neuro 

## 2016-04-05 NOTE — Progress Notes (Signed)
Patient ID: Stephanie Lloyd, female    DOB: 1970-02-05  Age: 46 y.o. MRN: [id number]    Subjective:  Subjective  HPI Stephanie Lloyd presents for f/u bp ,  Hx cva with L hemiplegia.  Pt is also getting iron infusions in hematology.  Pt is still tired but feels better with infusions.  Pt was unable to get PT due to high bp 176/114  Review of Systems  Constitutional: Positive for fatigue. Negative for appetite change, diaphoresis and unexpected weight change.  Eyes: Negative for pain, redness and visual disturbance.  Respiratory: Negative for cough, chest tightness, shortness of breath and wheezing.   Cardiovascular: Negative for chest pain, palpitations and leg swelling.  Endocrine: Negative for cold intolerance, heat intolerance, polydipsia, polyphagia and polyuria.  Genitourinary: Negative for difficulty urinating, dysuria and frequency.  Neurological: Positive for weakness. Negative for dizziness, light-headedness, numbness and headaches.    History Past Medical History:  Diagnosis Date  . Diabetes ([place])   . Diabetes mellitus without complication ([place])   . Diastolic dysfunction   . Hypertension   . Immune deficiency disorder ([place])   . Menometrorrhagia 04/01/2016  . Stroke [facility])     She has a past surgical history that includes No past surgeries.   Her family history is not on file.She reports that she has never smoked. She has never used smokeless tobacco. She reports that she does not drink alcohol or use drugs.  Current Outpatient Prescriptions on File Prior to Visit  Medication Sig Dispense Refill  . ACCU-CHEK FASTCLIX LANCETS MISC Check blood sugar twice daily. Dx E:11.9 (Patient taking differently: Check blood sugar twice daily. Dx E:11.9) 102 each 12  . aspirin EC 325 MG EC tablet Take 1 tablet (325 mg total) by mouth daily. 30 tablet 11  . Blood Glucose Monitoring Suppl (ACCU-CHEK GUIDE) w/Device KIT 1 Device by Does not apply route as directed. Check blood sugar daily 1  kit 0  . fluticasone (FLONASE) 50 MCG/ACT nasal spray Place 2 sprays into both nostrils daily. 16 g 1  . glucose blood (ACCU-CHEK GUIDE) test strip Check blood sugar twice daily. Dx E:11.9 100 each 12  . insulin NPH-regular Human (NOVOLIN 70/30 RELION) (70-30) 100 UNIT/ML injection Inject 15 Units into the skin 2 (two) times daily with a meal. (Patient taking differently: Inject 20 Units into the skin 2 (two) times daily with a meal. ) 10 mL 2  . levocetirizine (XYZAL) 5 MG tablet Take 1 tablet (5 mg total) by mouth every evening. 30 tablet 1   No current facility-administered medications on file prior to visit.      Objective:  Objective  Physical Exam  Constitutional: She is oriented to person, place, and time. She appears well-developed and well-nourished.  HENT:  Head: Normocephalic and atraumatic.  Eyes: Conjunctivae and EOM are normal.  Neck: Normal range of motion. Neck supple. No JVD present. Carotid bruit is not present. No thyromegaly present.  Cardiovascular: Normal rate, regular rhythm and normal heart sounds.   No murmur heard. Pulmonary/Chest: Effort normal and breath sounds normal. No respiratory distress. She has no wheezes. She has no rales. She exhibits no tenderness.  Musculoskeletal: She exhibits no edema.  Neurological: She is alert and oriented to person, place, and time. She displays normal reflexes.  Psychiatric: She has a normal mood and affect. Her behavior is normal. Judgment and thought content normal.  Nursing note and vitals reviewed.  BP (!) 148/90 (BP Location: Right Arm, Patient Position: Sitting, Cuff  Size: Normal)   Pulse 69   Temp 97.7 F (36.5 C) (Oral)   Resp 17   Ht 6' (1.829 m)   Wt 191 lb 12.8 oz (87 kg)   SpO2 97%   BMI 26.01 kg/m  Wt Readings from Last 3 Encounters:  04/05/16 191 lb 12.8 oz (87 kg)  04/01/16 192 lb (87.1 kg)  03/21/16 191 lb 12.8 oz (87 kg)     Lab Results  Component Value Date   WBC 7.2 04/01/2016   HGB 11.5 (L)  04/01/2016   HCT 35.4 04/01/2016   PLT 198 04/01/2016   GLUCOSE 226 (H) 04/01/2016   CHOL 170 02/29/2016   TRIG 74 02/29/2016   HDL 56 02/29/2016   LDLCALC 99 02/29/2016   ALT 17 04/01/2016   AST 15 04/01/2016   NA 139 04/01/2016   K 4.1 04/01/2016   CL 106 03/06/2016   CREATININE 1.3 (H) 04/01/2016   BUN 14.7 04/01/2016   CO2 26 04/01/2016   TSH 0.755 03/01/2016   INR 1.26 10/28/2015   HGBA1C 6.9 (H) 02/29/2016    Dg Swallowing Func-speech Pathology  Result Date: 03/18/2016 Objective Swallowing Evaluation: Type of Study: MBS-Modified Barium Swallow Study Patient Details Name: GUDRUN AXE" MRN: [id number] Date of Birth: 08/26/1970 Today's Date: 03/18/2016 Time: 1400 Past Medical History: Past Medical History: Diagnosis Date . Diabetes ([place])  . Diabetes mellitus without complication ([place])  . Diastolic dysfunction  . Hypertension  . Immune deficiency disorder ([place])  . Stroke [facility]  Past Surgical History: Past Surgical History: Procedure Laterality Date . NO PAST SURGERIES   HPI: 46 year old female referred for outpatient MBS to objectively assess swallow function and safety, following OP SLP evaluation 03/14/16. PMH significant for CVA (May 2016. January 2018), DM2, HTN, acute encephalopathy. Pt reports choking frequently when eating/drinking since May 2016 stroke, worse since stroke last month. Pt also presents with slurred speech.  Follow up appointment with outpatient clinic scheduled for tomorrow. Subjective: Pt seen in radiology for outpatient MBS. Husband present Assessment / Plan / Recommendation CHL IP CLINICAL IMPRESSIONS 03/18/2016 Clinical Impression Mild oropharyngeal dysphagia, characterized by slight difficulty with posterior propulsion of (dry) barium tablet, and delayed swallow reflex on thin liquids, with trigger seen at the vallecula or pyriform sinuses. Pt was challenged to take large and consecutive boluses on liquid and solids during this assessment. No penetration  or aspiration, and no post-swallow residue was seen on any consistency. Esophageal sweep revealed it cleared without difficulty or delay.  Recommend continuing with regular diet and thin liquids. The following safe swallow strategies were encouraged: minimize distractions, small bites/sips at slow rate.  SLP Visit Diagnosis Dysphagia, oropharyngeal phase (R13.12) Impact on safety and function Mild aspiration risk     Prognosis 03/18/2016 Prognosis for Safe Diet Advancement Good CHL IP DIET RECOMMENDATION 03/18/2016 SLP Diet Recommendations Regular solids;Thin liquid Liquid Administration via Cup;Straw Medication Administration Whole meds with liquid Compensations Minimize environmental distractions;Slow rate;Small sips/bites Postural Changes Seated upright at 90 degrees   CHL IP OTHER RECOMMENDATIONS 03/18/2016 Oral Care Recommendations Oral care BID    CHL IP ORAL PHASE 03/18/2016 Oral Phase Impaired Oral - Pill Reduced posterior propulsion Oral Phase - Comment multiple boluses of thin liquid required to clear barium tablet from oral cavity.  CHL IP PHARYNGEAL PHASE 03/18/2016 Pharyngeal Phase Impaired Pharyngeal- Thin Cup Delayed swallow initiation-vallecula;Delayed swallow initiation-pyriform sinuses Pharyngeal- Thin Straw Delayed swallow initiation-vallecula;Delayed swallow initiation-pyriform sinuses Pharyngeal- Puree WFL Pharyngeal- Regular WFL Pharyngeal- Pill WFL  CHL IP CERVICAL ESOPHAGEAL PHASE 03/18/2016 Cervical Esophageal Phase WFL CHL IP GO 03/18/2016 Functional Assessment Tool Used asha noms, clinical judgment, MBS Functional Limitations Swallowing Swallow Current Status (V5001) CI Swallow Goal Status (U4290) CI Swallow Discharge Status (P7955) CI Celia B. Baker, [facility], [place] Shonna Chock 03/18/2016, 2:51 PM     Assessment & Plan:  Plan  I have discontinued Ms. Wish's lisinopril and lisinopril. I am also having her start on lisinopril. Additionally, I am having her maintain her  ACCU-CHEK GUIDE, ACCU-CHEK FASTCLIX LANCETS, glucose blood, insulin NPH-regular Human, aspirin, levocetirizine, fluticasone, and atorvastatin.  Meds ordered this encounter  Medications  . DISCONTD: lisinopril (PRINIVIL,ZESTRIL) 20 MG tablet    Sig: TAKE ONE TABLET BY MOUTH TWICE DAILY    Dispense:  90 tablet    Refill:  3  . lisinopril (PRINIVIL,ZESTRIL) 20 MG tablet    Sig: Take 1 tablet (20 mg total) by mouth daily.    Dispense:  90 tablet    Refill:  3  . atorvastatin (LIPITOR) 80 MG tablet    Sig: Take 1 tablet (80 mg total) by mouth daily at 6 PM.    Dispense:  30 tablet    Refill:  3    Problem List Items Addressed This Visit      Unprioritized   Hyperlipidemia LDL goal <70   Relevant Medications   lisinopril (PRINIVIL,ZESTRIL) 20 MG tablet   atorvastatin (LIPITOR) 80 MG tablet   Cerebrovascular accident (CVA) with involvement of left side of body ([place])    Per neuro        Relevant Medications   lisinopril (PRINIVIL,ZESTRIL) 20 MG tablet   atorvastatin (LIPITOR) 80 MG tablet   Diabetes mellitus ([place])    con't insulin Check labs in May      Relevant Medications   lisinopril (PRINIVIL,ZESTRIL) 20 MG tablet   atorvastatin (LIPITOR) 80 MG tablet   Essential hypertension - Primary    Slightly elevated Inc lisinopril 40 mg qd       Relevant Medications   lisinopril (PRINIVIL,ZESTRIL) 20 MG tablet   atorvastatin (LIPITOR) 80 MG tablet   Iron deficiency anemia due to chronic blood loss    Per hematology         Follow-up: Return in about 3 months (around 07/06/2016) for Hypertension. and labs  Ann Held, DO

## 2016-04-05 NOTE — Assessment & Plan Note (Signed)
Per hematology 

## 2016-04-05 NOTE — Progress Notes (Signed)
Subjective:             HPI        Review of Systems        Objective:    Physical Exam        Assessment:             Plan:

## 2016-04-05 NOTE — Progress Notes (Signed)
Pre visit review using our clinic review tool, if applicable. No additional management support is needed unless otherwise documented below in the visit note. 

## 2016-04-05 NOTE — Patient Instructions (Signed)
Hypertension °Hypertension is another name for high blood pressure. High blood pressure forces your heart to work harder to pump blood. This can cause problems over time. °There are two numbers in a blood pressure reading. There is a top number (systolic) over a bottom number (diastolic). It is best to have a blood pressure below 120/80. Healthy choices can help lower your blood pressure. You may need medicine to help lower your blood pressure if: °· Your blood pressure cannot be lowered with healthy choices. °· Your blood pressure is higher than 130/80. °Follow these instructions at home: °Eating and drinking  °· If directed, follow the DASH eating plan. This diet includes: °¨ Filling half of your plate at each meal with fruits and vegetables. °¨ Filling one quarter of your plate at each meal with whole grains. Whole grains include whole wheat pasta, brown rice, and whole grain bread. °¨ Eating or drinking low-fat dairy products, such as skim milk or low-fat yogurt. °¨ Filling one quarter of your plate at each meal with low-fat (lean) proteins. Low-fat proteins include fish, skinless chicken, eggs, beans, and tofu. °¨ Avoiding fatty meat, cured and processed meat, or chicken with skin. °¨ Avoiding premade or processed food. °· Eat less than 1,500 mg of salt (sodium) a day. °· Limit alcohol use to no more than 1 drink a day for nonpregnant women and 2 drinks a day for men. One drink equals 12 oz of beer, 5 oz of wine, or 1½ oz of hard liquor. °Lifestyle  °· Work with your doctor to stay at a healthy weight or to lose weight. Ask your doctor what the best weight is for you. °· Get at least 30 minutes of exercise that causes your heart to beat faster (aerobic exercise) most days of the week. This may include walking, swimming, or biking. °· Get at least 30 minutes of exercise that strengthens your muscles (resistance exercise) at least 3 days a week. This may include lifting weights or pilates. °· Do not use any  products that contain nicotine or tobacco. This includes cigarettes and e-cigarettes. If you need help quitting, ask your doctor. °· Check your blood pressure at home as told by your doctor. °· Keep all follow-up visits as told by your doctor. This is important. °Medicines  °· Take over-the-counter and prescription medicines only as told by your doctor. Follow directions carefully. °· Do not skip doses of blood pressure medicine. The medicine does not work as well if you skip doses. Skipping doses also puts you at risk for problems. °· Ask your doctor about side effects or reactions to medicines that you should watch for. °Contact a doctor if: °· You think you are having a reaction to the medicine you are taking. °· You have headaches that keep coming back (recurring). °· You feel dizzy. °· You have swelling in your ankles. °· You have trouble with your vision. °Get help right away if: °· You get a very bad headache. °· You start to feel confused. °· You feel weak or numb. °· You feel faint. °· You get very bad pain in your: °¨ Chest. °¨ Belly (abdomen). °· You throw up (vomit) more than once. °· You have trouble breathing. °Summary °· Hypertension is another name for high blood pressure. °· Making healthy choices can help lower blood pressure. If your blood pressure cannot be controlled with healthy choices, you may need to take medicine. °This information is not intended to replace advice given to you by your   health care provider. Make sure you discuss any questions you have with your health care provider. °Document Released: 07/03/2007 Document Revised: 12/13/2015 Document Reviewed: 12/13/2015 °Elsevier Interactive Patient Education © 2017 [employer] Inc. ° °

## 2016-04-05 NOTE — Assessment & Plan Note (Signed)
Slightly elevated Inc lisinopril 40 mg qd

## 2016-04-08 ENCOUNTER — Encounter: Payer: Self-pay | Admitting: Occupational Therapy

## 2016-04-08 ENCOUNTER — Ambulatory Visit: Payer: Medicaid Other | Admitting: Rehabilitation

## 2016-04-08 ENCOUNTER — Ambulatory Visit: Payer: Medicaid Other | Admitting: Occupational Therapy

## 2016-04-08 ENCOUNTER — Ambulatory Visit: Payer: Medicaid Other

## 2016-04-08 VITALS — BP 146/84 | HR 60

## 2016-04-08 DIAGNOSIS — M6281 Muscle weakness (generalized): Secondary | ICD-10-CM

## 2016-04-08 DIAGNOSIS — R471 Dysarthria and anarthria: Secondary | ICD-10-CM | POA: Diagnosis not present

## 2016-04-08 DIAGNOSIS — R2689 Other abnormalities of gait and mobility: Secondary | ICD-10-CM

## 2016-04-08 DIAGNOSIS — R2681 Unsteadiness on feet: Secondary | ICD-10-CM

## 2016-04-08 DIAGNOSIS — I69354 Hemiplegia and hemiparesis following cerebral infarction affecting left non-dominant side: Secondary | ICD-10-CM

## 2016-04-08 DIAGNOSIS — I69318 Other symptoms and signs involving cognitive functions following cerebral infarction: Secondary | ICD-10-CM

## 2016-04-08 DIAGNOSIS — R41841 Cognitive communication deficit: Secondary | ICD-10-CM

## 2016-04-08 DIAGNOSIS — R1312 Dysphagia, oropharyngeal phase: Secondary | ICD-10-CM

## 2016-04-08 DIAGNOSIS — R278 Other lack of coordination: Secondary | ICD-10-CM

## 2016-04-08 NOTE — Patient Instructions (Signed)
1. Grip Strengthening (Resistive Putty)   Squeeze putty using thumb and all fingers. Repeat _20___ times. Do __2__ sessions per day.   2. Roll putty into tube on table and pinch between your index, middle and thumb along the tube. Do this three times.     Coordination Activities  Perform the following activities for 15-20 minutes 1-2 times per day with both hand(s).  Rotate ball in fingertips (clockwise and counter-clockwise). Toss ball between hands. Toss ball in air and catch with the same hand. Flip cards 1 at a time as fast as you can. Pick up coins and place in container or coin bank. Pick up coins and stack. Pick up coins one at a time until you get 5-10 in your hand, then try and place in back one at a time while holding the others in your hand.  Practice writing and/or typing. Practice screwing nuts and bolts   Copyright  VHI. All rights reserved.

## 2016-04-08 NOTE — Therapy (Signed)
[facility] [address] [place], [place], [address] Phone: [phone]   Fax:  [phone]  Occupational Therapy Treatment  Patient Details  Name: Stephanie ShirtsMarva N Zmuda MRN: [id number] Date of Birth: 04/25/1970 Referring Provider: Dr. Florina OuYvonne Chase  Encounter Date: 04/08/2016      OT End of Session - 04/08/16 1305    Visit Number 1101   OT Start Time 1101   OT Stop Time 1144   OT Time Calculation (min) 43 min   Activity Tolerance Patient tolerated treatment well      Past Medical History:  Diagnosis Date  . Diabetes (HCC)   . Diabetes mellitus without complication (HCC)   . Diastolic dysfunction   . Hypertension   . Immune deficiency disorder (HCC)   . Menometrorrhagia 04/01/2016  . Stroke [facility](HCC)     Past Surgical History:  Procedure Laterality Date  . 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 No/denies                      OT Treatments/Exercises (OP) - 04/08/16 0001      ADLs   ADL Comments Completed MOCA - pt with score of 22/30. Primary deficts in visual spatial and working memory not straight recall     Exercises   Exercises Hand     Hand Exercises   Theraputty - Grip Pt issued red putty for HEP for grip and pinch strength. See pt instruction for details.      Fine Motor Coordination   Other Fine Motor Exercises Pt issued HEP for coordination - pt able to return demonstrate all activities after practice .  See pt instruction for details                OT Education - 04/08/16 1303    Education provided Yes   Education Details HEP for grip, pinch and  coodination   Person(s) Educated Patient   Methods Explanation;Demonstration;Verbal cues;Handout   Comprehension Verbalized understanding;Returned demonstration             OT Long Term Goals - 04/08/16 1303      OT LONG TERM GOAL #1   Title Independent with coordination HEP and putty HEP    Baseline DEPENDENT   Time 8   Period Weeks   Status New     OT LONG TERM GOAL #2   Title Improve coordination bilaterally as evidenced by reducing speed on 9 hole peg test by 5 sec. or more   Baseline eval: Rt = 43.25 sec, Lt = 45.57 sec   Time 8   Period Weeks   Status New     OT LONG TERM GOAL #3   Title Grip strength Rt dominant hand to be 45 lbs or greater to open jars/containers   Baseline eval = 38 lbs (Lt = 50 lbs)    Time 8   Period Weeks   Status New     OT LONG TERM GOAL #4   Title Pt/family to verbalize understanding with memory compensatory strategies and how  to implement    Baseline dependent   Time 8   Period Weeks   Status New     OT LONG TERM GOAL #5   Title Pt to fully return to laundry tasks and consistently preparing snacks, cold meal, microwaveable items    Baseline dependent since recent stroke   Time 8   Period Weeks   Status New     OT LONG TERM GOAL #6   Title Cognition goal TBD prn    Baseline (based on MOCA score next visit)    Time 8   Period Weeks   Status New               Plan - 04/08/16 1303    Clinical Impression Statement Pt progressing toward goals. Pt able to return demonstrate HEP   Rehab Potential Fair   Clinical Impairments Affecting Rehab Potential premorbid status (previous CVA's x2), limitied visits with insurance   OT Frequency 1x / week   OT Duration 8 weeks   OT Treatment/Interventions Self-care/ADL training;Neuromuscular education;Therapeutic exercise;Cognitive remediation/compensation;Therapeutic activities;Patient/family education;Visual/perceptual remediation/compensation   Plan write cognition goal,     Consulted and Agree with Plan of Care Patient      Patient will benefit from skilled therapeutic intervention in order to improve the following deficits and impairments:  Decreased coordination, Decreased endurance, Decreased safety awareness, Decreased activity tolerance, Decreased knowledge of precautions, Impaired tone, Decreased balance, Decreased knowledge of use of DME, Impaired UE functional use, Decreased cognition, Decreased mobility, Decreased strength, Impaired perceived functional ability, Impaired vision/preception  Visit Diagnosis: Hemiplegia and hemiparesis following cerebral infarction affecting left non-dominant side (HCC)  Muscle weakness (generalized)  Other lack of coordination  Other symptoms and signs involving cognitive functions following cerebral infarction  Unsteadiness on feet    Problem List Patient Active Problem List   Diagnosis Date Noted  . Iron deficiency anemia due to chronic blood loss 04/01/2016  . Menometrorrhagia 04/01/2016  . Upper respiratory infection 03/25/2016  . History of stroke   . History of intracerebral hemorrhage without residual deficit   . Cerebrovascular accident (CVA) with involvement of left side of body (HCC) 02/28/2016  . Ischemic stroke (HCC) 02/28/2016  . Acute kidney injury (HCC) 10/28/2015  . Acute encephalopathy 10/28/2015  . Diastolic dysfunction   . Stroke (cerebrum) (HCC)   . Focal infarction of brain (HCC) 10/08/2015  . Syncope 08/19/2015  . Hypertensive emergency 07/29/2015  . Hypertensive urgency 07/28/2015  . Hypokalemia 07/28/2015  . Accelerated hypertension 09/21/2014  . Type 2 diabetes mellitus with other circulatory complications 09/21/2014  . Hyperlipidemia 09/21/2014  . 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 Hyperlipidemia LDL goal <70 07/29/2014  . Hemorrhagic stroke (HCC) 06/22/2014  . ICH (intracerebral hemorrhage) (HCC) 06/22/2014  . Essential hypertension 05/13/2012  . Diabetes  mellitus (HCC) 05/13/2012    Norton Pastel, OTR/L 04/08/2016, 1:06 PM  [facility] [address] [place], [place], [address] Phone: [phone]   Fax:  [phone]  Name: CRICKET GOODLIN MRN: [id number] Date of Birth: Feb 16, 1970

## 2016-04-08 NOTE — Patient Instructions (Signed)
Functional Quadriceps: Sit to Stand    Sit on edge of chair, feet flat on floor. Stand upright, extending knees fully.  Sit very slowly  Repeat _10___ times per set. Do ___1_ sets per session. Do __1-2__ sessions per day.  http://orth.exer.us/735   Copyright  VHI. All rights reserved.   "I love a Parade" Lift    Do this along a counter top for support.  March as high but as slow as you can forwards then backwards.  Repeat x 3 reps down and back.   Repeat __3__ times. Do __1-2_ sessions per day.  http://gt2.exer.us/345   Copyright  VHI. All rights reserved.

## 2016-04-08 NOTE — Therapy (Signed)
[facility] [address] [place], [place], [address] Phone: [phone]   Fax:  [phone]  Physical Therapy Treatment  Patient Details  Name: Stephanie ShirtsMarva N Lloyd MRN: [id number] Date of Birth: 02/17/1970 Referring Provider: Donato SchultzYvonne R [name], DO  Encounter Date: 04/08/2016      PT End of Session - 04/08/16 1349    Visit Number 2  session cancelled-did not change visit number   Number of Visits 9   Date for PT Re-Evaluation 05/27/16   Authorization Type MCD-8 visits approved from 2/19-4/15    Authorization - Visit Number 1   Authorization - Number of Visits 8   PT Start Time 0931   PT Stop Time 1016   PT Time Calculation (min) 45 min   Activity Tolerance Treatment limited secondary to medical complications (Comment);Patient tolerated treatment well   Behavior During Therapy [facility] for tasks assessed/performed      Past Medical History:  Diagnosis Date  . Diabetes (HCC)   . Diabetes mellitus without complication (HCC)   . Diastolic dysfunction   . Hypertension   . Immune deficiency disorder (HCC)   . Menometrorrhagia 04/01/2016  . Stroke [facility](HCC)     Past Surgical History:  Procedure Laterality Date  . NO PAST SURGERIES      There were no vitals filed for this visit.      Subjective Assessment - 04/08/16 0937    Subjective Pt ambulatory in clinic with no AD and states she has been walking without AD for 3 weeks.     Patient is accompained by: Family member   Pertinent History Goes by "[employer]" (husband Harvie HeckRandy)    Limitations House hold activities;Walking;Standing   Currently in Pain? No/denies            [facility] PT Assessment - 04/08/16 0953      Ambulation/Gait   Ambulation/Gait Yes   Ambulation/Gait Assistance 5: Supervision   Ambulation/Gait Assistance Details Had pt ambulate during 6MWT with cane initially, however this was notably difficult from a sequencing standpoint, therefore removed cane  and just had her ambulate as normal for testing.  Ambulatory at S level with no overt LOB noted.  See below for details.  Then worked on pt ambulating with [facility] with more instruction and hand over hand cuing for proper use of cane with emphasis on larger step length bilaterally and improved gait speed.  Again, pt unable to follow cues or return demo on use of cane, therefore removed cane and worked on gait speed and stride length with good return demo following facilitation from PT.    Ambulation Distance (Feet) 500 Feet  in addition to 6MWT distance   Assistive device Straight cane;None   Gait Pattern Step-through pattern;Decreased arm swing - right;Decreased arm swing - left;Decreased stride length;Decreased hip/knee flexion - right;Decreased hip/knee flexion - left;Lateral hip instability;Shuffle;Narrow base of support   Ambulation Surface Level;Indoor     6 Minute Walk- Baseline   6 Minute Walk- Baseline yes   BP (mmHg) 138/87   HR (bpm) 63   02 Sat (%RA) 100 %   Modified Borg Scale for Dyspnea 0- Nothing at all   Perceived Rate of Exertion (Borg) 6-     6 Minute walk- Post Test   6 Minute Walk Post Test yes   BP (mmHg) 139/87   HR (bpm) 64   02 Sat (%RA) 99 %   Modified Borg Scale for Dyspnea 0- Nothing at all   Perceived Rate of  Exertion (Borg) 9- very light     6 minute walk test results    Aerobic Endurance Distance Walked 679   Endurance additional comments no AD; increasingly more L lateral trunk lean with invcreased fatigue/distance ambulated                     [facility] Adult PT Treatment/Exercise - 04/08/16 0953      Therapeutic Activites    Therapeutic Activities Other Therapeutic Activities   Other Therapeutic Activities Went over floor transfer during session as she gets up from air mattress each morning.  Uses RW to get up and therefore had her return demo.  She did this very well but did provide cues for increased use of LEs to push up rather than pulling on  RW for safety.  Pt verbalized understanding.       Exercises   Exercises Other Exercises   Other Exercises  Had her perform sit<>stand for BLE strengthening x 10 reps without UE support and marching along counter top for HEP, see pt instruction for details.                  PT Education - 04/08/16 1349    Education provided Yes   Education Details HEP   Person(s) Educated Patient   Methods Explanation   Comprehension Verbalized understanding          PT Short Term Goals - 03/14/16 1233      PT SHORT TERM GOAL #1   Title Pt will perform initial HEP with mod I using paper handout to indicate safe compliance, maximize functional gains. Target date: Following 4th visit after eval)   Baseline dependent for current program   Time 4   Period Weeks   Status New     PT SHORT TERM GOAL #2   Title Pt will improve BERG balance score to >52/56 in order to indicate decreased fall risk.     Baseline 46/56 on 03/14/16   Time 4   Period Weeks   Status New     PT SHORT TERM GOAL #3   Title Pt will increase gait speed to 1.59 ft/sec in order to indicate decreased fall risk and improved efficiency of gait.     Baseline .99 ft/sec on 03/14/16   Time 4   Period Weeks   Status New     PT SHORT TERM GOAL #4   Title Will assess and improve 150' from baseline in order to indicate improved functional endurnace.    Baseline did not have time to assess on eval   Time 4   Period Weeks   Status New     PT SHORT TERM GOAL #5   Title Pt will perform floor recovery transfer at mod I level in order to indicate safe transfer from floor and safety getting up from air mattress.     Baseline requires assist from husband and RW at this time   Time 4   Period Weeks   Status New     Additional Short Term Goals   Additional Short Term Goals Yes     PT SHORT TERM GOAL #6   Title Pt will ambulate up to 200' over indoor surfaces with SPC at S level in order to indicate increased independence  with household gait.    Baseline S to mod I level with RW over indoor surfaces   Time 4   Period Weeks   Status New  PT SHORT TERM GOAL #7   Title Pt will improve 5TSS time to <20 secs without UE support in order to indicate improved functional endurance.     Baseline 29.50 secs with UE support on lap           PT Long Term Goals - 03/14/16 1243      PT LONG TERM GOAL #1   Title Pt will be independent with handout with final HEP in order to indicate improved functional mobility.  (Target Date: following 8th visit after eval)   Baseline dependent    Time 8   Period Weeks   Status New     PT LONG TERM GOAL #2   Title Will perform FGA as able and set goal as appropriate to indicate improved balance.     Baseline 46/56 on BERG   Time 8   Period Weeks   Status New     PT LONG TERM GOAL #3   Title Pt will improve gait speed to 2.19 ft/sec in order to indicate decreased fall risk and improved efficiency of gait.     Baseline .99 ft/sec 03/14/16   Time 8   Period Weeks   Status New     PT LONG TERM GOAL #4   Title Pt will ambulate up to 500' over unlevel paved surfaces (including ramp/curb) w/ SPC or LRAD at S level (due to cognition) in order to improve community negotiation.     Baseline not assessed on eval   Time 8   Period Weeks   Status New     PT LONG TERM GOAL #5   Title Pt will improve by 300' from baseline in order to indicate improved functional endurance.     Baseline not assessed on eval   Time 8   Period Weeks   Status New     Additional Long Term Goals   Additional Long Term Goals Yes     PT LONG TERM GOAL #6   Title Pt will perform 5TSS in </=15 secs without UE support in order to indicate improved functional strength.     Baseline 29.50 secs with UE support on lap   Time 8   Period Weeks   Status New               Plan - 04/08/16 1351    Clinical Impression Statement Skilled session performed with distance of 4' with age  matched normals being >39' indicative of significantly decreased functional endurance.  Discussed walking for exercise but would like to provide walking program in future visits.  Also went over floor recovery as she does this daily and provided initial HEP.  BP remained WFL during session, see section for details.     Rehab Potential Good   PT Frequency 1x / week   PT Duration 8 weeks   PT Treatment/Interventions ADLs/Self Care Home Management;Electrical Stimulation;DME Instruction;Gait training;Stair training;Functional mobility training;Therapeutic activities;Therapeutic exercise;Balance training;Neuromuscular re-education;Patient/family education;Orthotic Fit/Training;Energy conservation;Vestibular;Visual/perceptual remediation/compensation   PT Next Visit Plan Add to HEP for R hip strength and balance as needed, improved gait speed and stride length   PT Home Exercise Plan See pt instruction from 03/14/16   Consulted and Agree with Plan of Care Patient      Patient will benefit from skilled therapeutic intervention in order to improve the following deficits and impairments:  Abnormal gait, Decreased activity tolerance, Decreased balance, Decreased cognition, Decreased endurance, Decreased knowledge of use of DME, Decreased mobility, Decreased strength, Impaired  perceived functional ability, Impaired flexibility, Impaired UE functional use, Improper body mechanics, Impaired vision/preception, Postural dysfunction  Visit Diagnosis: Hemiplegia and hemiparesis following cerebral infarction affecting left non-dominant side (HCC)  Muscle weakness (generalized)  Other abnormalities of gait and mobility     Problem List Patient Active Problem List   Diagnosis Date Noted  . Iron deficiency anemia due to chronic blood loss 04/01/2016  . Menometrorrhagia 04/01/2016  . Upper respiratory infection 03/25/2016  . History of stroke   . History of intracerebral hemorrhage without residual  deficit   . Cerebrovascular accident (CVA) with involvement of left side of body (HCC) 02/28/2016  . Ischemic stroke (HCC) 02/28/2016  . Acute kidney injury (HCC) 10/28/2015  . Acute encephalopathy 10/28/2015  . Diastolic dysfunction   . Stroke (cerebrum) (HCC)   . Focal infarction of brain (HCC) 10/08/2015  . Syncope 08/19/2015  . Hypertensive emergency 07/29/2015  . Hypertensive urgency 07/28/2015  . Hypokalemia 07/28/2015  . Accelerated hypertension 09/21/2014  . Type 2 diabetes mellitus with other circulatory complications 09/21/2014  . Hyperlipidemia 09/21/2014  . 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 Hyperlipidemia LDL goal <70 07/29/2014  . Hemorrhagic stroke (HCC) 06/22/2014  . ICH (intracerebral hemorrhage) (HCC) 06/22/2014  . Essential hypertension 05/13/2012  . Diabetes mellitus (HCC) 05/13/2012    [name], PT, MPT [facility] [address] [place], [place], [address] Phone: [phone]   Fax:  [phone] 04/08/16, 2:00 PM  Name: Stephanie Lloyd MRN: [id number] Date of Birth: [birth year]

## 2016-04-08 NOTE — Patient Instructions (Signed)
Do your speech exercises in a mirror to help you with "plate glass" talking - over-enunciating your speech.  REMEMBER: NO distractions while eating Small sips/bites Chew thoroughly Swallow hard

## 2016-04-08 NOTE — Therapy (Signed)
[facility] [address] [place], [place], [address] Phone: [phone]   Fax:  [phone]  Speech Language Pathology Treatment  Patient Details  Name: Stephanie Lloyd MRN: [id number] Date of Birth: February 24, 1970 Referring Provider: Roma Schanz, MD  Encounter Date: 04/08/2016      End of Session - 04/08/16 1239    Visit Number 4   Number of Visits 9   Date for SLP Re-Evaluation 05/17/16   Authorization Type MEDICAID   Authorization Time Period expires 05-12-16   Authorization - Visit Number 4   Authorization - Number of Visits 9   SLP Start Time 1150   SLP Stop Time  1230   SLP Time Calculation (min) 40 min   Activity Tolerance Patient tolerated treatment well      Past Medical History:  Diagnosis Date  . Diabetes ([place])   . Diabetes mellitus without complication ([place])   . Diastolic dysfunction   . Hypertension   . Immune deficiency disorder ([place])   . Menometrorrhagia 04/01/2016  . Stroke [facility])     Past Surgical History:  Procedure Laterality Date  . NO PAST SURGERIES      There were no vitals filed for this visit.      Subjective Assessment - 04/08/16 1156    Subjective "(My BP) It's ok."   Currently in Pain? No/denies               ADULT SLP TREATMENT - 04/08/16 1156      General Information   Behavior/Cognition Alert;Cooperative;Pleasant mood     Treatment Provided   Treatment provided Cognitive-Linquistic     Dysphagia Treatment   Temperature Spikes Noted No   Respiratory Status Room air   Oral Cavity - Dentition Adequate natural dentition   Treatment Methods Skilled observation;Therapeutic exercise;Patient/caregiver education   Patient observed directly with PO's Yes   Type of PO's observed Dysphagia 3 (soft);Thin liquids   Feeding Able to feed self   Liquids provided via Cup   Oral Phase Signs & Symptoms --  none noted   Pharyngeal Phase Signs & Symptoms Immediate  cough  with a sip pt deemed "too big" x1/5 sips   Type of cueing Verbal;Visual   Amount of cueing Moderate   Other treatment/comments Pt named small bites and sips and chew thoroughly as her precautions. SLP reminded pt of minimzing distractions as well. Pt agreed with minimizing distractions as a good idea for mealtimes. SLP reviewed these 5 minutes later and pt omitted minimizing distractions. SLP reviewed HEP for dysphagia and pt req'd mod A usually, after telling SLP she has been completing HEP x1/day. With pt's use of strategies, she took (what pt deemed a) large sip which resulted in cough x1/5 sips. Pt took single sips of H2O, adn small bites of cereal bar.       Cognitive-Linquistic Treatment   Treatment focused on Dysarthria   Skilled Treatment When SLP reviewed strategies for pt's incr'd intelligibility, she named "talking through a plate glass window" (overarticuation), louder speech, incr'd loudness. SLP reiterated "plate glass speech" and provided demo, and reminded pt about slow rate of speech.  Pt responded to SLP (scrambled 3 and 4 word sentences) with min-mod A for use of strategies, occasionally. When pt looked at SLP, overarticulation was better.  SLP told pt practice sentences for speech in a mirror.     Assessment / Recommendations / Plan   Plan Continue with current plan of care  Progression Toward Goals   Progression toward goals Progressing toward goals          SLP Education - 04/08/16 1238    Education provided Yes   Education Details swallow HEP, speech strategies, safe swallow strategies, speech homework in mirror   Person(s) Educated Patient   Methods Explanation;Demonstration;Verbal cues   Comprehension Verbalized understanding;Returned demonstration;Verbal cues required;Need further instruction          SLP Short Term Goals - 04/08/16 1300      SLP SHORT TERM GOAL #1   Title pt will complete dysarthria HEP with occasional min A over two sessions    Baseline 03/29/16   Time 1   Period Weeks   Status Not Met  and ongoing     SLP SHORT TERM GOAL #2   Title pt will complete dysphagia HEP with occasional min A over two sessions   Baseline 03/29/16   Time 1   Period Weeks   Status Not Met  and ongoing     SLP SHORT TERM GOAL #3   Title pt will report less frequent overt s/s aspiration at home   Status Achieved     SLP SHORT TERM GOAL #4   Title pt will demo dysarthria compensations in 15/20 sentence responses   Time 1   Period Weeks   Status Not Met  and ongoing          SLP Long Term Goals - 04/08/16 1301      SLP LONG TERM GOAL #1   Title pt will complete HEP for dysarthria with rare min A over two sessions   Baseline 03/29/16   Time 5   Period Weeks   Status On-going     SLP LONG TERM GOAL #2   Title pt will complete HEP for dysphagia with rare min A over two sessions   Baseline 03/29/16   Time 5   Period Weeks     SLP LONG TERM GOAL #3   Title pt will follow swallow precautions with POs with modified independence over two sessions   Baseline 03/29/16   Time 5   Period Weeks   Status On-going     SLP LONG TERM GOAL #4   Title pt will demo speech intelligibliity compensations with occasional min A in 10 minutes simple to mod complex conversation   Time 5   Period Weeks   Status On-going          Plan - 04/08/16 1240    Clinical Impression Statement SLP had to cue pt for HEP for swallowing, safe swallow strategies, and for speech strategies. She coughed once on a sip she stated was too big out of 5 sips. In pracitce using speech strategies she req'd  cues from SLP. Skilled ST continues req'd to improve swallowing and speech intelligibility/clarity.   Speech Therapy Frequency 1x /week   Duration --  8 weeks   Treatment/Interventions Aspiration precaution training;Pharyngeal strengthening exercises;Diet toleration management by SLP;Oral motor exercises;Compensatory strategies;Functional tasks;Cueing  hierarchy;Patient/family education;Multimodal communcation approach;Internal/external aids;SLP instruction and feedback;Compensatory techniques   Potential to Achieve Goals Good   Potential Considerations Previous level of function;Severity of impairments;Financial resources   SLP Home Exercise Plan reviewed and provided   Consulted and Agree with Plan of Care Patient      Patient will benefit from skilled therapeutic intervention in order to improve the following deficits and impairments:   Dysarthria and anarthria  Cognitive communication deficit  Dysphagia, oropharyngeal phase    Problem List  Patient Active Problem List   Diagnosis Date Noted  . Iron deficiency anemia due to chronic blood loss 04/01/2016  . Menometrorrhagia 04/01/2016  . Upper respiratory infection 03/25/2016  . History of stroke   . History of intracerebral hemorrhage without residual deficit   . 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 Ischemic stroke ([place]) 02/28/2016  . Acute kidney injury ([place]) 10/28/2015  . Acute encephalopathy 10/28/2015  . Diastolic dysfunction   . Stroke (cerebrum) ([place])   . Focal infarction of brain ([place]) 10/08/2015  . Syncope 08/19/2015  . Hypertensive emergency 07/29/2015  . Hypertensive urgency 07/28/2015  . Hypokalemia 07/28/2015  . Accelerated hypertension 09/21/2014  . Type 2 diabetes mellitus with other circulatory complications 43/60/6770  . Hyperlipidemia 09/21/2014  . Thalamic hemorrhage with stroke ([place]) 07/29/2014  . Hyperlipidemia LDL goal <70 07/29/2014  . Hemorrhagic stroke ([place]) 06/22/2014  . ICH (intracerebral hemorrhage) ([place]) 06/22/2014  . Essential hypertension 05/13/2012  . Diabetes mellitus ([place]) 05/13/2012    [place] ,[place], [place]  04/08/2016, 1:01 PM  [facility] [address] [place], [place], [address] Phone: [phone]   Fax:   [phone]   Name: LALIA LOUDON MRN: [id number] Date of Birth: [birth year]

## 2016-04-09 ENCOUNTER — Ambulatory Visit (HOSPITAL_BASED_OUTPATIENT_CLINIC_OR_DEPARTMENT_OTHER): Payer: Medicaid Other

## 2016-04-09 VITALS — BP 148/83 | HR 58 | Temp 97.4°F | Resp 16

## 2016-04-09 DIAGNOSIS — D500 Iron deficiency anemia secondary to blood loss (chronic): Secondary | ICD-10-CM | POA: Diagnosis not present

## 2016-04-09 MED ORDER — SODIUM CHLORIDE 0.9 % IV SOLN
510.0000 mg | Freq: Once | INTRAVENOUS | Status: AC
  Administered 2016-04-09: 510 mg via INTRAVENOUS
  Filled 2016-04-09: qty 17

## 2016-04-09 MED ORDER — SODIUM CHLORIDE 0.9 % IV SOLN
Freq: Once | INTRAVENOUS | Status: AC
  Administered 2016-04-09: 14:00:00 via INTRAVENOUS

## 2016-04-09 NOTE — Patient Instructions (Signed)

## 2016-04-10 ENCOUNTER — Encounter (HOSPITAL_COMMUNITY): Payer: Self-pay | Admitting: *Deleted

## 2016-04-10 ENCOUNTER — Emergency Department (HOSPITAL_COMMUNITY)
Admission: EM | Admit: 2016-04-10 | Discharge: 2016-04-10 | Disposition: A | Discharge status: Home / Self Care | Payer: Medicaid Other | Attending: Emergency Medicine | Admitting: Emergency Medicine

## 2016-04-10 ENCOUNTER — Ambulatory Visit: Payer: Self-pay

## 2016-04-10 DIAGNOSIS — Z7982 Long term (current) use of aspirin: Secondary | ICD-10-CM | POA: Insufficient documentation

## 2016-04-10 DIAGNOSIS — I10 Essential (primary) hypertension: Secondary | ICD-10-CM | POA: Insufficient documentation

## 2016-04-10 DIAGNOSIS — Z8673 Personal history of transient ischemic attack (TIA), and cerebral infarction without residual deficits: Secondary | ICD-10-CM | POA: Insufficient documentation

## 2016-04-10 DIAGNOSIS — Z79899 Other long term (current) drug therapy: Secondary | ICD-10-CM | POA: Diagnosis not present

## 2016-04-10 DIAGNOSIS — Z794 Long term (current) use of insulin: Secondary | ICD-10-CM | POA: Insufficient documentation

## 2016-04-10 DIAGNOSIS — E119 Type 2 diabetes mellitus without complications: Secondary | ICD-10-CM | POA: Diagnosis not present

## 2016-04-10 LAB — CBC
HCT: 35.5 % — ABNORMAL LOW (ref 36.0–46.0)
HEMOGLOBIN: 11.7 g/dL — AB (ref 12.0–15.0)
MCH: 26.7 pg (ref 26.0–34.0)
MCHC: 33 g/dL (ref 30.0–36.0)
MCV: 80.9 fL (ref 78.0–100.0)
Platelets: 219 10*3/uL (ref 150–400)
RBC: 4.39 MIL/uL (ref 3.87–5.11)
RDW: 14.1 % (ref 11.5–15.5)
WBC: 7.9 10*3/uL (ref 4.0–10.5)

## 2016-04-10 LAB — BASIC METABOLIC PANEL
ANION GAP: 7 (ref 5–15)
BUN: 12 mg/dL (ref 6–20)
CHLORIDE: 103 mmol/L (ref 101–111)
CO2: 25 mmol/L (ref 22–32)
Calcium: 9.4 mg/dL (ref 8.9–10.3)
Creatinine, Ser: 1.22 mg/dL — ABNORMAL HIGH (ref 0.44–1.00)
GFR calc non Af Amer: 52 mL/min — ABNORMAL LOW (ref >60)
Glucose, Bld: 112 mg/dL — ABNORMAL HIGH (ref 65–99)
POTASSIUM: 3.7 mmol/L (ref 3.5–5.1)
Sodium: 135 mmol/L (ref 135–145)

## 2016-04-10 LAB — I-STAT TROPONIN, ED: Troponin i, poc: 0 ng/mL (ref 0.00–0.08)

## 2016-04-10 NOTE — ED Notes (Signed)
Discharge instructions and follow up care reviewed with patient. Patient verbalized understanding. 

## 2016-04-10 NOTE — ED Notes (Signed)
ED Provider at bedside. 

## 2016-04-10 NOTE — ED Triage Notes (Signed)
Pt's husband reports pt's BP have been elevated for the past few days, pt takes her meds in the am and have taken her dose today.  He reports that they live with his sister and thinks that she is not getting enough sleep d/t family members hanging out in the living room in the morning where pt sleeps.  Pt denies any h/a or dizziness at this time.  Pt has hx of CVA in the past.

## 2016-04-10 NOTE — ED Provider Notes (Signed)
[facility] DEPT Provider Note   CSN: [id number] Arrival date & time: 04/10/16  1220     History   Chief Complaint Chief Complaint  Patient presents with  . Hypertension    HPI Stephanie Lloyd is a 46 y.o. female.  86 yo F with a chief complaints of a somatic hypertension. Patient was at the dentist to get dental work done and they checked her blood pressure and noted it was elevated. They told her to go immediately to the emergency department. She denies any new symptoms of shortness breath denies difficulty with her vision denies strokelike symptoms. She does have some slurred speech and some visual deficit from her prior strokes.   The history is provided by the patient.  Hypertension  This is a chronic problem. The current episode started 3 to 5 hours ago. The problem occurs constantly. The problem has not changed since onset.Pertinent negatives include no chest pain, no headaches and no shortness of breath. Nothing aggravates the symptoms. Nothing relieves the symptoms. She has tried nothing for the symptoms. The treatment provided no relief.    Past Medical History:  Diagnosis Date  . Diabetes ([place])   . Diabetes mellitus without complication ([place])   . Diastolic dysfunction   . Hypertension   . Immune deficiency disorder ([place])   . Menometrorrhagia 04/01/2016  . Stroke [facility])     Patient Active Problem List   Diagnosis Date Noted  . Iron deficiency anemia due to chronic blood loss 04/01/2016  . Menometrorrhagia 04/01/2016  . Upper respiratory infection 03/25/2016  . History of stroke   . History of intracerebral hemorrhage without residual deficit   . 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 Ischemic stroke ([place]) 02/28/2016  . Acute kidney injury ([place]) 10/28/2015  . Acute encephalopathy 10/28/2015  . Diastolic dysfunction   . Stroke (cerebrum) ([place])   . 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 Syncope  08/19/2015  . Hypertensive emergency 07/29/2015  . Hypertensive urgency 07/28/2015  . Hypokalemia 07/28/2015  . Accelerated hypertension 09/21/2014  . Type 2 diabetes mellitus with other circulatory complications 62/69/4854  . Hyperlipidemia 09/21/2014  . Thalamic hemorrhage with stroke ([place]) 07/29/2014  . Hyperlipidemia LDL goal <70 07/29/2014  . Hemorrhagic stroke ([place]) 06/22/2014  . ICH (intracerebral hemorrhage) ([place]) 06/22/2014  . Essential hypertension 05/13/2012  . Diabetes mellitus ([place]) 05/13/2012    Past Surgical History:  Procedure Laterality Date  . NO PAST SURGERIES      OB History    Gravida Para Term Preterm AB Living   0 0 0 0 0     SAB TAB Ectopic Multiple Live Births   0 0 0           Home Medications    Prior to Admission medications   Medication Sig Start Date End Date Taking? Authorizing Provider  ACCU-CHEK FASTCLIX LANCETS MISC Check blood sugar twice daily. Dx E:11.9 Patient taking differently: Check blood sugar twice daily. Dx E:11.9 09/08/15   Rosalita Chessman Chase, DO  aspirin EC 325 MG EC tablet Take 1 tablet (325 mg total) by mouth daily. 03/02/16   Steve Rattler, DO  atorvastatin (LIPITOR) 80 MG tablet Take 1 tablet (80 mg total) by mouth daily at 6 PM. 04/05/16   Rosalita Chessman Chase, DO  Blood Glucose Monitoring Suppl (ACCU-CHEK GUIDE) w/Device KIT 1 Device by Does not apply route as directed. Check blood sugar daily 09/08/15   Kendrick Fries  R [name], DO  fluticasone (FLONASE) 50 MCG/ACT nasal spray Place 2 sprays into both nostrils daily. 03/21/16   Rosalita Chessman Chase, DO  glucose blood (ACCU-CHEK GUIDE) test strip Check blood sugar twice daily. Dx E:11.9 09/08/15   Rosalita Chessman Chase, DO  insulin NPH-regular Human (NOVOLIN 70/30 RELION) (70-30) 100 UNIT/ML injection Inject 15 Units into the skin 2 (two) times daily with a meal. Patient taking differently: Inject 20 Units into the skin 2 (two) times daily with a meal.  12/07/15   Rosalita Chessman  Chase, DO  levocetirizine (XYZAL) 5 MG tablet Take 1 tablet (5 mg total) by mouth every evening. 03/21/16   Rosalita Chessman Chase, DO  lisinopril (PRINIVIL,ZESTRIL) 20 MG tablet Take 1 tablet (20 mg total) by mouth daily. Patient not taking: Reported on 04/08/2016 04/05/16   Rosalita Chessman Chase, DO  lisinopril (PRINIVIL,ZESTRIL) 40 MG tablet Take 40 mg by mouth daily.    Historical Provider, MD    Family History No family history on file.  Social History Social History  Substance Use Topics  . Smoking status: Never Smoker  . Smokeless tobacco: Never Used  . Alcohol use No     Comment: occ     Allergies   Metformin and related   Review of Systems Review of Systems  Constitutional: Negative for chills and fever.  HENT: Negative for congestion and rhinorrhea.   Eyes: Negative for redness and visual disturbance.  Respiratory: Negative for shortness of breath and wheezing.   Cardiovascular: Negative for chest pain and palpitations.  Gastrointestinal: Negative for nausea and vomiting.  Genitourinary: Negative for dysuria and urgency.  Musculoskeletal: Negative for arthralgias and myalgias.  Skin: Negative for pallor and wound.  Neurological: Negative for dizziness and headaches.     Physical Exam Updated Vital Signs BP (!) 186/105 (BP Location: Left Arm)   Pulse (!) 57   Resp 18   LMP 03/06/2016   SpO2 100%   Physical Exam  Constitutional: She is oriented to person, place, and time. She appears well-developed and well-nourished. No distress.  HENT:  Head: Normocephalic and atraumatic.  Eyes: EOM are normal. Pupils are equal, round, and reactive to light.  Neck: Normal range of motion. Neck supple.  Cardiovascular: Normal rate and regular rhythm.  Exam reveals no gallop and no friction rub.   No murmur heard. Pulmonary/Chest: Effort normal. She has no wheezes. She has no rales.  Abdominal: Soft. She exhibits no distension. There is no tenderness. There is no guarding.    Musculoskeletal: She exhibits no edema or tenderness.  Neurological: She is alert and oriented to person, place, and time. She has normal strength. Coordination and gait normal. GCS eye subscore is 4. GCS verbal subscore is 5. GCS motor subscore is 6.  Slurred speech  Skin: Skin is warm and dry. She is not diaphoretic.  Psychiatric: She has a normal mood and affect. Her behavior is normal.  Nursing note and vitals reviewed.    ED Treatments / Results  Labs (all labs ordered are listed, but only abnormal results are displayed) Labs Reviewed  BASIC METABOLIC PANEL - Abnormal; Notable for the following:       Result Value   Glucose, Bld 112 (*)    Creatinine, Ser 1.22 (*)    GFR calc non Af Amer 52 (*)    All other components within normal limits  CBC - Abnormal; Notable for the following:    Hemoglobin 11.7 (*)  HCT 35.5 (*)    All other components within normal limits  I-STAT TROPOININ, ED    EKG  EKG Interpretation None       Radiology No results found.  Procedures Procedures (including critical care time)  Medications Ordered in ED Medications - No data to display   Initial Impression / Assessment and Plan / ED Course  I have reviewed the triage vital signs and the nursing notes.  Pertinent labs & imaging results that were available during my care of the patient were reviewed by me and considered in my medical decision making (see chart for details).     Patient asymptomatic with no noted s/s of end organ damage.  No chest pain, diaphoresis, nausea or other acs symptoms.  No headache or neurologic complaints, , no unequal pulses, normal pulse ox without rales or sob.  Feel this is unlikely to be a Hypertensive Emergency and recent studies suggest no benefit for inpatient admission.  There are also no studies to my knowledge suggesting that patients with hypertensive urgency have increased risk for end organ disease.The patient will follow up closely with their  PCP.  Compliance with their medication stressed.    Lowell Guitar, Cicero Duck EH, et al. Characteristics and outcomes of patients presenting with hypertensive urgency in the office setting. JAMA Intern Med. 2016 Jul 1; 176(7): 981-8.   The patients results and plan were reviewed and discussed.   Any x-rays performed were independently reviewed by myself.   Differential diagnosis were considered with the presenting HPI.  Medications - No data to display  Vitals:   04/10/16 1237 04/10/16 1300  BP: (!) 204/119 (!) 186/105  Pulse: (!) 57   Resp: 18   SpO2: 100%     Final diagnoses:  Asymptomatic hypertension  Essential hypertension     Final Clinical Impressions(s) / ED Diagnoses   Final diagnoses:  Asymptomatic hypertension  Essential hypertension    New Prescriptions New Prescriptions   No medications on file     Deno Etienne, DO 04/10/16 1426

## 2016-04-10 NOTE — Discharge Instructions (Signed)
Follow up with your family doc.  Return for chest pain, shortness of breath, stroke symptoms.

## 2016-04-16 ENCOUNTER — Telehealth: Payer: Self-pay

## 2016-04-16 NOTE — Telephone Encounter (Signed)
LM notifying pt that appt needed and that I scheduled her for 3/23 10:30am with Dr. Laury AxonLowne - requested pt to call back to confirm appt so that dental work can be done without delay

## 2016-04-16 NOTE — Telephone Encounter (Signed)
Pt scheduled to have dental work done.  Received medical clearance form from [facility].  Pt was recently in the ER for hypertension.  Before patient can be cleared, an office visit is needed.    Please call patient and schedule a 30 min office visit with Dr. Laury AxonLowne.

## 2016-04-18 ENCOUNTER — Ambulatory Visit: Payer: Medicaid Other | Admitting: Physical Therapy

## 2016-04-18 ENCOUNTER — Ambulatory Visit: Payer: Medicaid Other

## 2016-04-18 ENCOUNTER — Ambulatory Visit: Payer: Medicaid Other | Admitting: Occupational Therapy

## 2016-04-19 ENCOUNTER — Encounter: Payer: Self-pay | Admitting: Family Medicine

## 2016-04-19 ENCOUNTER — Ambulatory Visit (INDEPENDENT_AMBULATORY_CARE_PROVIDER_SITE_OTHER): Payer: Medicaid Other | Admitting: Family Medicine

## 2016-04-19 VITALS — BP 135/68 | HR 60 | Temp 97.6°F | Resp 16 | Ht 72.0 in | Wt 190.2 lb

## 2016-04-19 DIAGNOSIS — I10 Essential (primary) hypertension: Secondary | ICD-10-CM

## 2016-04-19 MED ORDER — AMLODIPINE BESYLATE 5 MG PO TABS: ORAL_TABLET | ORAL | 2 refills | Status: DC

## 2016-04-19 NOTE — Telephone Encounter (Signed)
Pt currently in office for appt.

## 2016-04-19 NOTE — Progress Notes (Signed)
Pre visit review using our clinic review tool, if applicable. No additional management support is needed unless otherwise documented below in the visit note. 

## 2016-04-19 NOTE — Progress Notes (Signed)
Patient ID: Stephanie Lloyd, female   DOB: [birth year], 46 y.o.   MRN: [id number]     Subjective:  I acted as a Education administrator for Dr. Carollee Herter.  Guerry Bruin, Secretary   Patient ID: Stephanie Lloyd, female    DOB: 1970-05-29, 46 y.o.   MRN: [id number]  Chief Complaint  Patient presents with  . Follow-up    HPI  Patient is in today to get form completed for dental work.  She just needs a cleaning done. bp have been up and down.  Pt has no complaints.   Patient Care Team: Ann Held, DO as PCP - General (Family Medicine) Marylynn Pearson, MD as Consulting Physician (Ophthalmology)   Past Medical History:  Diagnosis Date  . Diabetes ([place])   . Diabetes mellitus without complication ([place])   . Diastolic dysfunction   . Hypertension   . Immune deficiency disorder ([place])   . Menometrorrhagia 04/01/2016  . Stroke [facility])     Past Surgical History:  Procedure Laterality Date  . NO PAST SURGERIES      No family history on file.  Social History   Social History  . Marital status: Married    Spouse name: N/A  . Number of children: N/A  . Years of education: N/A   Occupational History  . Not on file.   Social History Main Topics  . Smoking status: Never Smoker  . Smokeless tobacco: Never Used  . Alcohol use No     Comment: occ  . Drug use: No  . Sexual activity: Yes    Birth control/ protection: None   Other Topics Concern  . Not on file   Social History Narrative   ** Merged History Encounter **        Outpatient Medications Prior to Visit  Medication Sig Dispense Refill  . ACCU-CHEK FASTCLIX LANCETS MISC Check blood sugar twice daily. Dx E:11.9 (Patient taking differently: Check blood sugar twice daily. Dx E:11.9) 102 each 12  . aspirin EC 325 MG EC tablet Take 1 tablet (325 mg total) by mouth daily. 30 tablet 11  . atorvastatin (LIPITOR) 80 MG tablet Take 1 tablet (80 mg total) by mouth daily at 6 PM. 30 tablet 3  . Blood Glucose Monitoring Suppl (ACCU-CHEK GUIDE)  w/Device KIT 1 Device by Does not apply route as directed. Check blood sugar daily 1 kit 0  . fluticasone (FLONASE) 50 MCG/ACT nasal spray Place 2 sprays into both nostrils daily. 16 g 1  . glucose blood (ACCU-CHEK GUIDE) test strip Check blood sugar twice daily. Dx E:11.9 100 each 12  . insulin NPH-regular Human (NOVOLIN 70/30 RELION) (70-30) 100 UNIT/ML injection Inject 15 Units into the skin 2 (two) times daily with a meal. (Patient taking differently: Inject 20 Units into the skin 2 (two) times daily with a meal. ) 10 mL 2  . levocetirizine (XYZAL) 5 MG tablet Take 1 tablet (5 mg total) by mouth every evening. 30 tablet 1  . lisinopril (PRINIVIL,ZESTRIL) 40 MG tablet Take 40 mg by mouth daily.    Marland Kitchen lisinopril (PRINIVIL,ZESTRIL) 20 MG tablet Take 1 tablet (20 mg total) by mouth daily. 90 tablet 3   No facility-administered medications prior to visit.     Allergies  Allergen Reactions  . Metformin And Related Diarrhea    Review of Systems  Constitutional: Negative for chills, fever and malaise/fatigue.  HENT: Negative for congestion and hearing loss.   Eyes: Negative for discharge.  Respiratory: Negative for cough,  sputum production and shortness of breath.   Cardiovascular: Negative for chest pain, palpitations and leg swelling.  Gastrointestinal: Negative for abdominal pain, blood in stool, constipation, diarrhea, heartburn, nausea and vomiting.  Genitourinary: Negative for dysuria, frequency, hematuria and urgency.  Musculoskeletal: Negative for back pain, falls and myalgias.  Skin: Negative for rash.  Neurological: Negative for dizziness, sensory change, loss of consciousness, weakness and headaches.  Endo/Heme/Allergies: Negative for environmental allergies. Does not bruise/bleed easily.  Psychiatric/Behavioral: Negative for depression and suicidal ideas. The patient is not nervous/anxious and does not have insomnia.        Objective:    Physical Exam  Constitutional: She is  oriented to person, place, and time. She appears well-developed and well-nourished.  HENT:  Head: Normocephalic and atraumatic.  Eyes: Conjunctivae and EOM are normal.  Neck: Normal range of motion. Neck supple. No JVD present. Carotid bruit is not present. No thyromegaly present.  Cardiovascular: Normal rate, regular rhythm and normal heart sounds.   No murmur heard. Pulmonary/Chest: Effort normal and breath sounds normal. No respiratory distress. She has no wheezes. She has no rales. She exhibits no tenderness.  Musculoskeletal: She exhibits no edema.  Neurological: She is alert and oriented to person, place, and time.  Psychiatric: She has a normal mood and affect.  Nursing note and vitals reviewed.   BP 135/68 (BP Location: Left Arm, Cuff Size: Normal)   Pulse 60   Temp 97.6 F (36.4 C) (Oral)   Resp 16   Ht 6' (1.829 m)   Wt 190 lb 3.2 oz (86.3 kg)   SpO2 100%   BMI 25.80 kg/m  Wt Readings from Last 3 Encounters:  04/19/16 190 lb 3.2 oz (86.3 kg)  04/05/16 191 lb 12.8 oz (87 kg)  04/01/16 192 lb (87.1 kg)   BP Readings from Last 3 Encounters:  04/19/16 135/68  04/10/16 (!) 181/105  04/09/16 (!) 148/83      There is no immunization history on file for this patient.  Health Maintenance  Topic Date Due  . HIV Screening  04/29/1983  . PAP SMEAR  04/28/1989  . FOOT EXAM  02/16/2016  . OPHTHALMOLOGY EXAM  04/17/2016  . INFLUENZA VACCINE  04/27/2016 (Originally 08/29/2015)  . PNEUMOCOCCAL POLYSACCHARIDE VACCINE (1) 04/05/2017 (Originally 04/29/1970)  . TETANUS/TDAP  04/05/2017 (Originally 04/29/1987)  . HEMOGLOBIN A1C  08/28/2016    Lab Results  Component Value Date   WBC 7.9 04/10/2016   HGB 11.7 (L) 04/10/2016   HCT 35.5 (L) 04/10/2016   PLT 219 04/10/2016   GLUCOSE 112 (H) 04/10/2016   CHOL 170 02/29/2016   TRIG 74 02/29/2016   HDL 56 02/29/2016   LDLCALC 99 02/29/2016   ALT 17 04/01/2016   AST 15 04/01/2016   NA 135 04/10/2016   K 3.7 04/10/2016   CL 103  04/10/2016   CREATININE 1.22 (H) 04/10/2016   BUN 12 04/10/2016   CO2 25 04/10/2016   TSH 0.755 03/01/2016   INR 1.26 10/28/2015   HGBA1C 6.9 (H) 02/29/2016    Lab Results  Component Value Date   TSH 0.755 03/01/2016   Lab Results  Component Value Date   WBC 7.9 04/10/2016   HGB 11.7 (L) 04/10/2016   HCT 35.5 (L) 04/10/2016   MCV 80.9 04/10/2016   PLT 219 04/10/2016   Lab Results  Component Value Date   NA 135 04/10/2016   K 3.7 04/10/2016   CHLORIDE 105 04/01/2016   CO2 25 04/10/2016   GLUCOSE 112 (H)  04/10/2016   BUN 12 04/10/2016   CREATININE 1.22 (H) 04/10/2016   BILITOT <0.22 04/01/2016   ALKPHOS 88 04/01/2016   AST 15 04/01/2016   ALT 17 04/01/2016   PROT 7.0 04/01/2016   ALBUMIN 3.6 04/01/2016   CALCIUM 9.4 04/10/2016   ANIONGAP 7 04/10/2016   EGFR 58 (L) 04/01/2016   GFR 61.11 03/06/2016   Lab Results  Component Value Date   CHOL 170 02/29/2016   Lab Results  Component Value Date   HDL 56 02/29/2016   Lab Results  Component Value Date   LDLCALC 99 02/29/2016   Lab Results  Component Value Date   TRIG 74 02/29/2016   Lab Results  Component Value Date   CHOLHDL 3.0 02/29/2016   Lab Results  Component Value Date   HGBA1C 6.9 (H) 02/29/2016         Assessment & Plan:   Problem List Items Addressed This Visit      Unprioritized   Essential hypertension - Primary    con't lisinopril and add norvasc rto 2-3 weeks Or sooner prn      Relevant Medications   amLODipine (NORVASC) 5 MG tablet      I am having Ms. Kramp start on amLODipine. I am also having her maintain her ACCU-CHEK GUIDE, ACCU-CHEK FASTCLIX LANCETS, glucose blood, insulin NPH-regular Human, aspirin, levocetirizine, fluticasone, atorvastatin, and lisinopril.  Meds ordered this encounter  Medications  . amLODipine (NORVASC) 5 MG tablet    Sig: 1 po qd    Dispense:  30 tablet    Refill:  2    CMA served as scribe during this visit. History, Physical and Plan  performed by medical provider. Documentation and orders reviewed and attested to.  Ann Held, DO

## 2016-04-19 NOTE — Patient Instructions (Signed)

## 2016-04-20 NOTE — Assessment & Plan Note (Signed)
con't lisinopril and add norvasc rto 2-3 weeks Or sooner prn

## 2016-04-22 ENCOUNTER — Ambulatory Visit: Payer: Medicaid Other | Admitting: Occupational Therapy

## 2016-04-22 ENCOUNTER — Ambulatory Visit: Payer: Medicaid Other | Admitting: Rehabilitation

## 2016-04-22 ENCOUNTER — Ambulatory Visit: Payer: Medicaid Other

## 2016-04-23 ENCOUNTER — Ambulatory Visit: Payer: Self-pay

## 2016-04-30 ENCOUNTER — Ambulatory Visit: Payer: Self-pay

## 2016-05-02 ENCOUNTER — Ambulatory Visit: Payer: Medicaid Other | Attending: Family Medicine | Admitting: Rehabilitation

## 2016-05-02 ENCOUNTER — Ambulatory Visit: Payer: Medicaid Other | Admitting: Occupational Therapy

## 2016-05-02 ENCOUNTER — Encounter: Payer: Self-pay | Admitting: Occupational Therapy

## 2016-05-02 ENCOUNTER — Ambulatory Visit: Payer: Medicaid Other

## 2016-05-02 ENCOUNTER — Encounter: Payer: Self-pay | Admitting: Rehabilitation

## 2016-05-02 DIAGNOSIS — R1312 Dysphagia, oropharyngeal phase: Secondary | ICD-10-CM

## 2016-05-02 DIAGNOSIS — R2681 Unsteadiness on feet: Secondary | ICD-10-CM | POA: Diagnosis present

## 2016-05-02 DIAGNOSIS — M6281 Muscle weakness (generalized): Secondary | ICD-10-CM | POA: Insufficient documentation

## 2016-05-02 DIAGNOSIS — I69318 Other symptoms and signs involving cognitive functions following cerebral infarction: Secondary | ICD-10-CM | POA: Insufficient documentation

## 2016-05-02 DIAGNOSIS — I69354 Hemiplegia and hemiparesis following cerebral infarction affecting left non-dominant side: Secondary | ICD-10-CM

## 2016-05-02 DIAGNOSIS — R41841 Cognitive communication deficit: Secondary | ICD-10-CM

## 2016-05-02 DIAGNOSIS — R2689 Other abnormalities of gait and mobility: Secondary | ICD-10-CM | POA: Insufficient documentation

## 2016-05-02 DIAGNOSIS — R471 Dysarthria and anarthria: Secondary | ICD-10-CM

## 2016-05-02 DIAGNOSIS — R278 Other lack of coordination: Secondary | ICD-10-CM

## 2016-05-02 NOTE — Therapy (Signed)
[facility] [address] [place], [place], [address] Phone: [phone]   Fax:  [phone]  Speech Language Pathology Treatment  Patient Details  Name: Stephanie Lloyd MRN: [id number] Date of Birth: 1970-04-13 Referring Provider: Roma Schanz, MD  Encounter Date: 05/02/2016      End of Session - 05/02/16 1407    Visit Number 5   Number of Visits 9   Date for SLP Re-Evaluation 05/17/16   Authorization Type MEDICAID   Authorization Time Period expires 05-12-16   Authorization - Visit Number 5   Authorization - Number of Visits 9   SLP Start Time 6834   SLP Stop Time  1400   SLP Time Calculation (min) 42 min   Activity Tolerance Patient tolerated treatment well      Past Medical History:  Diagnosis Date  . Diabetes ([place])   . Diabetes mellitus without complication ([place])   . Diastolic dysfunction   . Hypertension   . Immune deficiency disorder ([place])   . Menometrorrhagia 04/01/2016  . Stroke [facility])     Past Surgical History:  Procedure Laterality Date  . NO PAST SURGERIES      There were no vitals filed for this visit.      Subjective Assessment - 05/02/16 1323    Subjective Pt with mildly dysarthric speech today.   Currently in Pain? No/denies               ADULT SLP TREATMENT - 05/02/16 1323      General Information   Behavior/Cognition Alert;Cooperative;Pleasant mood     Treatment Provided   Treatment provided Cognitive-Linquistic     Dysphagia Treatment   Temperature Spikes Noted No   Respiratory Status Room air   Oral Cavity - Dentition Adequate natural dentition   Treatment Methods Skilled observation;Therapeutic exercise;Compensation strategy training   Patient observed directly with PO's Yes   Type of PO's observed Dysphagia 3 (soft);Thin liquids   Oral Phase Signs & Symptoms --  no overt s/s difficulty   Pharyngeal Phase Signs & Symptoms --  no overt s/s today   Other  treatment/comments SLP assessed pt's use of compensatory measures with POs today, given pt's complaint of coughing once-twice each day. She followed precautions with independence. No overt s/s aspiration with 6 bites cheese and 8 sips water. SLp suggested to pt that she loses control of liquids during mealtimes rarely, and she agreed that was probably the reason for rare cough with meals.     Cognitive-Linquistic Treatment   Skilled Treatment SLP reviewed strategies for pt's incr'd intelligibility. Pt told SLP 2/3 (omitted overarticulation). SLP reiterated overarticulation and exampled it for pt. In reading 2-sentences, pt req'd rare min A for speech strategies. SLP told pt practice sentences for speech in a mirror.  SLP then educated pt on a dysarthria strengthening HEP and provided handout (see pt instructions).      Assessment / Recommendations / Plan   Plan Continue with current plan of care     Progression Toward Goals   Progression toward goals Progressing toward goals          SLP Education - 05/02/16 1407    Education provided Yes   Education Details oral strength HEP   Person(s) Educated Patient   Methods Explanation;Demonstration;Verbal cues;Handout   Comprehension Verbalized understanding;Returned demonstration;Verbal cues required;Need further instruction          SLP Short Term Goals - 05/02/16 1626      SLP SHORT  TERM GOAL #1   Title pt will complete dysarthria HEP with occasional min A over two sessions   Baseline 03/29/16   Time 2   Period Weeks   Status Partially Met  pt has completed HEP correctly in one/two sessions     SLP SHORT TERM GOAL #2   Title pt will complete dysphagia HEP with occasional min A over two sessions   Baseline 03/29/16   Time 2   Period Weeks   Status Partially Met  pt has completed correctly one/two sessions     SLP SHORT TERM GOAL #3   Title pt will report less frequent overt s/s aspiration at home   Status Achieved     SLP SHORT  TERM GOAL #4   Title pt will demo dysarthria compensations in 15/20 sentence responses   Time 1   Period Weeks   Status Achieved  GOAL MET 05-02-16          SLP Long Term Goals - 05/02/16 1637      SLP LONG TERM GOAL #1   Title pt will complete HEP for dysarthria with rare min A over two sessions   Baseline with rare min A for one session   Time 4   Period Weeks   Status Partially Met  completed in 1/2 sessions     SLP LONG TERM GOAL #2   Title pt will complete HEP for dysphagia with rare min A over two sessions   Baseline completed with rare min A in one session   Time 4   Period Weeks   Status Partially Met  met in 1/2 sessions     SLP LONG TERM GOAL #3   Title pt will follow swallow precautions with POs with modified independence over two sessions   Time --   Period --   Status Achieved  met 05-02-16     SLP LONG TERM GOAL #4   Title pt will demo speech intelligibliity compensations with occasional min A in 10 minutes simple to mod complex conversation   Baseline strategies are completed with rare min SLP cues in structured speech tasks   Time 5   Period Weeks   Status Partially Met  accomplshed in strucutred speech tasks.          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 Pt was discharged from the hospital on 03-01-16.  Pt has been seen for 4 speech therapy sessions in 7 weeks due to pt's blood pressure issues and family's medical complications. Pt has made progress with swallowing, as SLP did not have to cue pt for HEP for safe swallow strategies today with POs. No overt s/s aspiration despite pt telling SLP she is coughing approx x2/day. SLP s opinion that pt loses track of sip size during meals, rarely, producing a cough.  In pracitce using speech strategies she req'd rare cues  from SLP and her intelligibility improved over the session as she improved ability to use strategies in structured tasks. No carryover to non-therapeutic tasks, howerver.      Patient will benefit from skilled therapeutic intervention in order to improve the following deficits and impairments:   Dysarthria and anarthria  Dysphagia, oropharyngeal phase  Cognitive communication deficit    Problem List Patient Active Problem List   Diagnosis Date Noted  . Iron deficiency anemia due  to chronic blood loss 04/01/2016  . Menometrorrhagia 04/01/2016  . Upper respiratory infection 03/25/2016  . History of stroke   . History of intracerebral hemorrhage without residual deficit   . Cerebrovascular accident (CVA) with involvement of left side of body ([place]) 02/28/2016  . Ischemic stroke ([place]) 02/28/2016  . Acute kidney injury ([place]) 10/28/2015  . Acute encephalopathy 10/28/2015  . Diastolic dysfunction   . Stroke (cerebrum) ([place])   . 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 Syncope 08/19/2015  . Hypertensive emergency 07/29/2015  . Hypertensive urgency 07/28/2015  . Hypokalemia 07/28/2015  . Accelerated hypertension 09/21/2014  . Type 2 diabetes mellitus with other circulatory complications 64/38/3818  . Hyperlipidemia 09/21/2014  . 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 Hyperlipidemia LDL goal <70 07/29/2014  . Hemorrhagic stroke ([place]) 06/22/2014  . ICH (intracerebral hemorrhage) ([place]) 06/22/2014  . Essential hypertension 05/13/2012  . Diabetes mellitus ([place]) 05/13/2012    [place] 05/02/2016, 4:45 PM  [facility] [address] [place], [place], [address] Phone: [phone]   Fax:  [phone]   Name: CHEYRL BULEY MRN: [id number] Date of Birth: 03/22/1970

## 2016-05-02 NOTE — Patient Instructions (Addendum)
   LIP and TONGUE exercises  --- DO ALL EXERCISES TWICE EACH DAY  1. Lip press - Press your lips together firmly (like making a /m/ sound) and hold 5 seconds -repeat 15 times  2. LOUD and LONG Pryor Montes - make a kissing sound as loud as you can, as long as you can - repeat 15 times  3. Tongue sweep - Move your tongue from way to the right to way to the left, in the pockets of your mouth - touch every tooth  - ten revolutions each way  4. PUH! - 15 times       MAKE THE SOUNDS STRONG!      TUH - 15 times      KUH! - 15 times  5. Move a licorice stick or straw from side to side in your mouth - 10 back and forth movements  6. Say "OOOOO", and "EEEEE" 10 times (Kiss and smile)  7. Put air in your cheeks, and push on either side 10 times  *KEEP THE AIR IN and DON'T LET IT ESCAPE!!*  8. Put your tongue in your left cheek and press against your finger for 3 seconds, then do your right cheek - 15 times each side    ====================================================== Read something every day for 5-10 minutes, focusing on slow, loud, and opening your mouth

## 2016-05-02 NOTE — Therapy (Signed)
[facility] [address] [place], [place], [address] Phone: [phone]   Fax:  [phone]  Occupational Therapy Treatment  Patient Details  Name: Stephanie Lloyd MRN: [id number] Date of Birth: 12/04/1970 Referring Provider: Dr. Lyndal Pulley  Encounter Date: 05/02/2016      OT End of Session - 05/02/16 1703    Visit Number 4   Number of Visits 9   Date for OT Re-Evaluation 05/12/16   Authorization Type MCD - pt approved for 8 visits by 05/12/2016   Authorization Time Period 03/18/16 - 05/12/16   Authorization - Visit Number 4   Authorization - Number of Visits 8   OT Start Time 7262   OT Stop Time 1531   OT Time Calculation (min) 43 min   Activity Tolerance Patient tolerated treatment well      Past Medical History:  Diagnosis Date  . Diabetes ([place])   . Diabetes mellitus without complication ([place])   . Diastolic dysfunction   . Hypertension   . Immune deficiency disorder ([place])   . Menometrorrhagia 04/01/2016  . Stroke [facility])     Past Surgical History:  Procedure Laterality Date  . NO PAST SURGERIES      There were no vitals filed for this visit.      Subjective Assessment - 05/02/16 1449    Subjective  My husband is finally feeling better   Pertinent History Hemorrhagic CVA x2 (05/2014, 09/2015)    Limitations Thickened liquids, fall risk, (hasn't driven since 0355)    Patient Stated Goals be able to cook again   Currently in Pain? No/denies                      OT Treatments/Exercises (OP) - 05/02/16 0001      ADLs   ADL Comments Checked progress toward long term goals - see goal section for updates.  Pt has missed 3 weeks of therapy due to medical issues (BP was very high and pt not medically able to attend) as well as pt's husband was ill and could not provide transportation last week for patient.  Pt has made exccellent progress toward goals however has not met all goals yet.  WIll  submit request to ask for date extension only (no additonal visits) from [facility] given that pt missed appts due to unavoidable issues.       Fine Motor Coordination   Other Fine Motor Exercises Addressed grip strength, fine motor control and in hand coordination  via functional activities. Pt with improving fine motor control however still with slow speed when using hands.  Pt also improving in grip strength.                       OT Long Term Goals - 05/02/16 1519      OT LONG TERM GOAL #1   Title Independent with coordination HEP and putty HEP    Baseline DEPENDENT   Time 8   Period Weeks   Status Achieved     OT LONG TERM GOAL #2   Title Improve coordination bilaterally as evidenced by reducing speed on 9 hole peg test by 5 sec. or more   Baseline eval: Rt = 43.25 sec, Lt = 45.57 sec   Time 8   Period Weeks   Status Achieved  R= 32.47, L = 34.60     OT LONG TERM GOAL #3   Title Grip strength Rt dominant hand  to be 45 lbs or greater to open jars/containers   Baseline eval = 38 lbs (Lt = 50 lbs)    Time 8   Period Weeks   Status Achieved  R= 55, L= 65     OT LONG TERM GOAL #4   Title Pt/family to verbalize understanding with memory compensatory strategies and how to implement    Baseline dependent   Time 8   Period Weeks   Status Partially Met  currently working on identifying strategies to assist and then will help pt and husband to implement     OT LONG TERM GOAL #5   Title Pt to fully return to laundry tasks and consistently preparing snacks, cold meal, microwaveable items    Baseline dependent since recent stroke   Time 8   Period Weeks   Status Partially Met  pt is doing laundry, preparing snack and cold meal prep. Pt is not currently cooking or using microwave yet     OT LONG TERM GOAL #6   Title Cognition goal TBD prn    Baseline (based on MOCA score next visit)    Time 8   Period Weeks   Status Achieved  Pts scored 22/30 on MOCA  Pt's MOCA  score showed predominately ST memory and working memory deficits as well as language issues . ST addressing language issues.  See  goal #4 above.                 Plan - 05/02/16 1702    Clinical Impression Statement Pt is progressing toward goals - see today's goal updates.  Pt is very motivated and is consistent with HEP at home.   Rehab Potential Fair   Clinical Impairments Affecting Rehab Potential premorbid status (previous CVA's x2), limitied visits with insurance   OT Frequency 1x / week   OT Duration 8 weeks   OT Treatment/Interventions Self-care/ADL training;Neuromuscular education;Therapeutic exercise;Cognitive remediation/compensation;Therapeutic activities;Patient/family education;Visual/perceptual remediation/compensation   Plan address ST memory strategies, begin simple cooking in kitchen   Consulted and Agree with Plan of Care Patient      Patient will benefit from skilled therapeutic intervention in order to improve the following deficits and impairments:  Decreased coordination, Decreased endurance, Decreased safety awareness, Decreased activity tolerance, Decreased knowledge of precautions, Impaired tone, Decreased balance, Decreased knowledge of use of DME, Impaired UE functional use, Decreased cognition, Decreased mobility, Decreased strength, Impaired perceived functional ability, Impaired vision/preception  Visit Diagnosis: Hemiplegia and hemiparesis following cerebral infarction affecting left non-dominant side (HCC)  Muscle weakness (generalized)  Other lack of coordination  Other symptoms and signs involving cognitive functions following cerebral infarction  Unsteadiness on feet    Problem List Patient Active Problem List   Diagnosis Date Noted  . Iron deficiency anemia due to chronic blood loss 04/01/2016  . Menometrorrhagia 04/01/2016  . Upper respiratory infection 03/25/2016  . History of stroke   . History of intracerebral hemorrhage without  residual deficit   . Cerebrovascular accident (CVA) with involvement of left side of body ([place]) 02/28/2016  . Ischemic stroke ([place]) 02/28/2016  . Acute kidney injury ([place]) 10/28/2015  . Acute encephalopathy 10/28/2015  . Diastolic dysfunction   . Stroke (cerebrum) ([place])   . Focal infarction of brain ([place]) 10/08/2015  . Syncope 08/19/2015  . Hypertensive emergency 07/29/2015  . Hypertensive urgency 07/28/2015  . Hypokalemia 07/28/2015  . Accelerated hypertension 09/21/2014  . Type 2 diabetes mellitus with other circulatory complications 01/77/9390  . Hyperlipidemia 09/21/2014  . Thalamic hemorrhage  with stroke ([place]) 07/29/2014  . Hyperlipidemia LDL goal <70 07/29/2014  . Hemorrhagic stroke ([place]) 06/22/2014  . ICH (intracerebral hemorrhage) ([place]) 06/22/2014  . Essential hypertension 05/13/2012  . Diabetes mellitus ([place]) 05/13/2012    Quay Burow, OTR/L 05/02/2016, 5:05 PM  [facility] [address] [place], [place], [address] Phone: [phone]   Fax:  [phone]  Name: DESTYNE GOODREAU MRN: [id number] Date of Birth: Mar 18, 1970

## 2016-05-02 NOTE — Therapy (Signed)
[place] [address] [place], [place], [address] Phone: [phone]   Fax:  [phone]  Physical Therapy Treatment  Patient Details  Name: Stephanie Lloyd MRN: [id number] Date of Birth: 02-29-1970 Referring Provider: Ann Held, DO  Encounter Date: 05/02/2016      PT End of Session - 05/02/16 2006    Visit Number 3   Number of Visits 9   Date for PT Re-Evaluation 06/16/16   Authorization Type MCD-8 visits approved from 2/19-4/15    Authorization - Visit Number 2   Authorization - Number of Visits 8   PT Start Time 3846   PT Stop Time 1628   PT Time Calculation (min) 43 min   Activity Tolerance Treatment limited secondary to medical complications (Comment);Patient tolerated treatment well   Behavior During Therapy [facility] for tasks assessed/performed      Past Medical History:  Diagnosis Date  . Diabetes ([place])   . Diabetes mellitus without complication ([place])   . Diastolic dysfunction   . Hypertension   . Immune deficiency disorder ([place])   . Menometrorrhagia 04/01/2016  . Stroke [facility])     Past Surgical History:  Procedure Laterality Date  . NO PAST SURGERIES      There were no vitals filed for this visit.      Subjective Assessment - 05/02/16 1958    Subjective Pt reports doing much better since having BP medication adjusted once more.  Has missed several visits in therapy due to BP issues and husband being sick.     Pertinent History Goes by "Stephanie Lloyd" (husband Louie Casa)    Limitations House hold activities;Walking;Standing   Currently in Pain? No/denies            [facility] PT Assessment - 05/02/16 1600      Transfers   Transfers Sit to Stand;Stand to Sit   Sit to Stand 6: Modified independent (Device/Increase time)   Five time sit to stand comments  16.22 secs without UE support   Stand to Sit 6: Modified independent (Device/Increase time)     Ambulation/Gait   Ambulation/Gait Yes    Ambulation/Gait Assistance 5: Supervision   Ambulation/Gait Assistance Details Assessed LTG with outdoor gait.  She is able to ambulate up to 500' at S level due to mild unsteadiness over grass, but no overt LOB.    Ambulation Distance (Feet) 500 Feet   Assistive device None   Gait Pattern Step-through pattern;Decreased arm swing - right;Decreased arm swing - left;Decreased stride length;Decreased hip/knee flexion - right;Decreased hip/knee flexion - left;Lateral hip instability;Shuffle;Narrow base of support   Ambulation Surface Level;Unlevel;Indoor;Paved;Outdoor;Grass   Stairs Yes   Stairs Assistance 6: Modified independent (Device/Increase time)   Stair Management Technique One rail Right;Alternating pattern;Forwards   Number of Stairs 4   Height of Stairs 6     6 Minute Walk- Baseline   6 Minute Walk- Baseline yes   BP (mmHg) 131/89   HR (bpm) 64   Modified Borg Scale for Dyspnea 0- Nothing at all   Perceived Rate of Exertion (Borg) 6-     6 Minute walk- Post Test   6 Minute Walk Post Test yes   BP (mmHg) 150/87   HR (bpm) 65   Modified Borg Scale for Dyspnea 0- Nothing at all   Perceived Rate of Exertion (Borg) 9- very light     6 minute walk test results    Aerobic Endurance Distance Walked 876   Endurance additional comments no  AD, no overt LOB, continues to demonstrate decreased stride length.      Functional Gait  Assessment   Gait assessed  Yes   Gait Level Surface Walks 20 ft in less than 7 sec but greater than 5.5 sec, uses assistive device, slower speed, mild gait deviations, or deviates 6-10 in outside of the 12 in walkway width.   Change in Gait Speed Able to change speed, demonstrates mild gait deviations, deviates 6-10 in outside of the 12 in walkway width, or no gait deviations, unable to achieve a major change in velocity, or uses a change in velocity, or uses an assistive device.   Gait with Horizontal Head Turns Performs head turns smoothly with slight change in  gait velocity (eg, minor disruption to smooth gait path), deviates 6-10 in outside 12 in walkway width, or uses an assistive device.   Gait with Vertical Head Turns Performs task with slight change in gait velocity (eg, minor disruption to smooth gait path), deviates 6 - 10 in outside 12 in walkway width or uses assistive device   Gait and Pivot Turn Pivot turns safely in greater than 3 sec and stops with no loss of balance, or pivot turns safely within 3 sec and stops with mild imbalance, requires small steps to catch balance.   Step Over Obstacle Is able to step over one shoe box (4.5 in total height) but must slow down and adjust steps to clear box safely. May require verbal cueing.   Gait with Narrow Base of Support Ambulates less than 4 steps heel to toe or cannot perform without assistance.   Gait with Eyes Closed Walks 20 ft, slow speed, abnormal gait pattern, evidence for imbalance, deviates 10-15 in outside 12 in walkway width. Requires more than 9 sec to ambulate 20 ft.   Ambulating Backwards Walks 20 ft, uses assistive device, slower speed, mild gait deviations, deviates 6-10 in outside 12 in walkway width.   Steps Alternating feet, must use rail.   Total Score 16                             PT Education - 05/02/16 2005    Education provided Yes   Education Details requesting extension for therapies    Person(s) Educated Patient   Methods Explanation   Comprehension Verbalized understanding          PT Short Term Goals - 03/14/16 1233      PT SHORT TERM GOAL #1   Title Pt will perform initial HEP with mod I using paper handout to indicate safe compliance, maximize functional gains. Target date: Following 4th visit after eval)   Baseline dependent for current program   Time 4   Period Weeks   Status New     PT SHORT TERM GOAL #2   Title Pt will improve BERG balance score to >52/56 in order to indicate decreased fall risk.     Baseline 46/56 on 03/14/16    Time 4   Period Weeks   Status New     PT SHORT TERM GOAL #3   Title Pt will increase gait speed to 1.59 ft/sec in order to indicate decreased fall risk and improved efficiency of gait.     Baseline .99 ft/sec on 03/14/16   Time 4   Period Weeks   Status New     PT SHORT TERM GOAL #4   Title Will assess 6MWT and improve 150' from  baseline in order to indicate improved functional endurnace.    Baseline did not have time to assess on eval   Time 4   Period Weeks   Status New     PT SHORT TERM GOAL #5   Title Pt will perform floor recovery transfer at mod I level in order to indicate safe transfer from floor and safety getting up from air mattress.     Baseline requires assist from husband and RW at this time   Time 4   Period Weeks   Status New     Additional Short Term Goals   Additional Short Term Goals Yes     PT SHORT TERM GOAL #6   Title Pt will ambulate up to 200' over indoor surfaces with SPC at S level in order to indicate increased independence with household gait.    Baseline S to mod I level with RW over indoor surfaces   Time 4   Period Weeks   Status New     PT SHORT TERM GOAL #7   Title Pt will improve 5TSS time to <20 secs without UE support in order to indicate improved functional endurance.     Baseline 29.50 secs with UE support on lap           PT Long Term Goals - 05/02/16 1554      PT LONG TERM GOAL #1   Title Pt will be independent with handout with final HEP in order to indicate improved functional mobility.  (Target Date: following 8th visit after eval)   Baseline Pt is participating in informal walking program, will need to go over formal HEP in future visits.     Time 8   Period Weeks   Status Partially Met     PT LONG TERM GOAL #2   Title Will perform FGA as able and set goal as appropriate to indicate improved balance.     Baseline 16/30 on FGA on 05/02/16   Time 8   Period Weeks   Status Achieved     PT LONG TERM GOAL #3   Title  Pt will improve gait speed to 2.19 ft/sec in order to indicate decreased fall risk and improved efficiency of gait.     Baseline Pt has improved gait speed to 1.45 ft/sec without AD, however this is still indicative of fall risk and decreased ability to ambulate at community level.     Time 8   Period Weeks   Status Not Met     PT LONG TERM GOAL #4   Title Pt will ambulate up to 500' over unlevel paved surfaces (including ramp/curb) w/ SPC or LRAD at S level (due to cognition) in order to improve community negotiation.     Baseline 500' over unlevel paved surfaces without AD at S level, would like to update to mod I level due to progress.    Time 8   Period Weeks   Status Achieved     PT LONG TERM GOAL #5   Title Pt will improve 6MWT by 300' from baseline in order to indicate improved functional endurance.     Baseline Pt went from 49' at baseline to 876' an increase of 197'   Time 8   Period Weeks   Status Partially Met     PT LONG TERM GOAL #6   Title Pt will perform 5TSS in </=15 secs without UE support in order to indicate improved functional strength.     Baseline 16.[place]  secs without UE support, still indicative of decreased functional strength   Time 8   Period Weeks   Status Partially Met               Plan - 05/02/16 2008    Clinical Impression Statement Due to pt absences outside of her control with elevated BP and husband being ill, this session focused on assessment of LTGs and request for extension of therapy to get remaining visits in.  Note that she has met 2/6 goals, but has made progress towards 3 remaining goals.  Feel that she would benefit from continued PT to address balance, strength and endurance deficits, therefore would like to request an extension of time from MCD.     Rehab Potential Good   PT Frequency 1x / week   PT Duration 6 weeks   PT Treatment/Interventions ADLs/Self Care Home Management;Electrical Stimulation;DME Instruction;Gait  training;Stair training;Functional mobility training;Therapeutic activities;Therapeutic exercise;Balance training;Neuromuscular re-education;Patient/family education;Orthotic Fit/Training;Energy conservation;Vestibular;Visual/perceptual remediation/compensation   PT Next Visit Plan Make more formal HEP, add balance, gait for improved speed and stride length, endurance.    PT Home Exercise Plan See pt instruction from 03/14/16   Consulted and Agree with Plan of Care Patient      Patient will benefit from skilled therapeutic intervention in order to improve the following deficits and impairments:  Abnormal gait, Decreased activity tolerance, Decreased balance, Decreased cognition, Decreased endurance, Decreased knowledge of use of DME, Decreased mobility, Decreased strength, Impaired perceived functional ability, Impaired flexibility, Impaired UE functional use, Improper body mechanics, Impaired vision/preception, Postural dysfunction  Visit Diagnosis: Hemiplegia and hemiparesis following cerebral infarction affecting left non-dominant side (HCC)  Muscle weakness (generalized)  Unsteadiness on feet  Other abnormalities of gait and mobility     Problem List Patient Active Problem List   Diagnosis Date Noted  . Iron deficiency anemia due to chronic blood loss 04/01/2016  . Menometrorrhagia 04/01/2016  . Upper respiratory infection 03/25/2016  . History of stroke   . History of intracerebral hemorrhage without residual deficit   . Cerebrovascular accident (CVA) with involvement of left side of body ([place]) 02/28/2016  . Ischemic stroke ([place]) 02/28/2016  . Acute kidney injury ([place]) 10/28/2015  . Acute encephalopathy 10/28/2015  . Diastolic dysfunction   . Stroke (cerebrum) ([place])   . Focal infarction of brain ([place]) 10/08/2015  . Syncope 08/19/2015  . Hypertensive emergency 07/29/2015  . Hypertensive urgency 07/28/2015  . Hypokalemia 07/28/2015  . Accelerated hypertension 09/21/2014  .  Type 2 diabetes mellitus with other circulatory complications 74/94/4967  . Hyperlipidemia 09/21/2014  . 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 Hyperlipidemia LDL goal <70 07/29/2014  . Hemorrhagic stroke ([place]) 06/22/2014  . ICH (intracerebral hemorrhage) ([place]) 06/22/2014  . Essential hypertension 05/13/2012  . Diabetes mellitus ([place]) 05/13/2012    Cameron Sprang, PT, MPT [facility] [address] [place], [place], [address] Phone: [phone]   Fax:  [phone] 05/02/16, 8:22 PM  Name: Stephanie Lloyd MRN: [id number] Date of Birth: Jun 23, 1970

## 2016-05-03 ENCOUNTER — Ambulatory Visit: Payer: Self-pay | Admitting: Family Medicine

## 2016-05-06 ENCOUNTER — Ambulatory Visit (INDEPENDENT_AMBULATORY_CARE_PROVIDER_SITE_OTHER): Payer: Medicaid Other | Admitting: Family Medicine

## 2016-05-06 ENCOUNTER — Encounter: Payer: Self-pay | Admitting: Family Medicine

## 2016-05-06 VITALS — BP 142/82 | HR 63 | Temp 98.0°F | Resp 16 | Ht 72.0 in | Wt 185.0 lb

## 2016-05-06 DIAGNOSIS — I10 Essential (primary) hypertension: Secondary | ICD-10-CM | POA: Diagnosis not present

## 2016-05-06 MED ORDER — AMLODIPINE BESYLATE 10 MG PO TABS: 10.0000 mg | ORAL_TABLET | Freq: Every day | ORAL | 3 refills | Status: DC

## 2016-05-06 NOTE — Progress Notes (Signed)
Pre visit review using our clinic review tool, if applicable. No additional management support is needed unless otherwise documented below in the visit note. 

## 2016-05-06 NOTE — Patient Instructions (Signed)

## 2016-05-06 NOTE — Progress Notes (Signed)
Dictation #1 [id number]  SEL:[id number]   Subjective:  \  Patient ID: Stephanie Lloyd, female    DOB: 03-11-1970, 46 y.o.   MRN: [id number]  Chief Complaint  Patient presents with  . Hypertension    follow up    Hypertension  This is a chronic problem. The current episode started more than 1 year ago. Pertinent negatives include no blurred vision, chest pain, headaches or palpitations.    Patient is in today for follow up hypertension. Patient report [facility] and the last reading at the therapy session was 150/70.   Patient Care Team: Ann Held, DO as PCP - General (Family Medicine) Marylynn Pearson, MD as Consulting Physician (Ophthalmology)   Past Medical History:  Diagnosis Date  . Diabetes ([place])   . Diabetes mellitus without complication ([place])   . Diastolic dysfunction   . Hypertension   . Immune deficiency disorder ([place])   . Menometrorrhagia 04/01/2016  . Stroke [facility])     Past Surgical History:  Procedure Laterality Date  . NO PAST SURGERIES      No family history on file.  Social History   Social History  . Marital status: Married    Spouse name: N/A  . Number of children: N/A  . Years of education: N/A   Occupational History  . Not on file.   Social History Main Topics  . Smoking status: Never Smoker  . Smokeless tobacco: Never Used  . Alcohol use No     Comment: occ  . Drug use: No  . Sexual activity: Yes    Birth control/ protection: None   Other Topics Concern  . Not on file   Social History Narrative   ** Merged History Encounter **        Outpatient Medications Prior to Visit  Medication Sig Dispense Refill  . ACCU-CHEK FASTCLIX LANCETS MISC Check blood sugar twice daily. Dx E:11.9 (Patient taking differently: Check blood sugar twice daily. Dx E:11.9) 102 each 12  . aspirin EC 325 MG EC tablet Take 1 tablet (325 mg total) by mouth daily. 30 tablet 11  . atorvastatin (LIPITOR) 80 MG tablet Take 1  tablet (80 mg total) by mouth daily at 6 PM. 30 tablet 3  . Blood Glucose Monitoring Suppl (ACCU-CHEK GUIDE) w/Device KIT 1 Device by Does not apply route as directed. Check blood sugar daily 1 kit 0  . fluticasone (FLONASE) 50 MCG/ACT nasal spray Place 2 sprays into both nostrils daily. 16 g 1  . glucose blood (ACCU-CHEK GUIDE) test strip Check blood sugar twice daily. Dx E:11.9 100 each 12  . insulin NPH-regular Human (NOVOLIN 70/30 RELION) (70-30) 100 UNIT/ML injection Inject 15 Units into the skin 2 (two) times daily with a meal. (Patient taking differently: Inject 20 Units into the skin 2 (two) times daily with a meal. ) 10 mL 2  . levocetirizine (XYZAL) 5 MG tablet Take 1 tablet (5 mg total) by mouth every evening. 30 tablet 1  . lisinopril (PRINIVIL,ZESTRIL) 40 MG tablet Take 40 mg by mouth daily.    Marland Kitchen amLODipine (NORVASC) 5 MG tablet 1 po qd 30 tablet 2   No facility-administered medications prior to visit.     Allergies  Allergen Reactions  . Metformin And Related Diarrhea    Review of Systems  Constitutional: Negative for fever.  HENT: Negative for congestion.   Eyes: Negative for blurred vision.  Respiratory: Negative for cough.   Cardiovascular:  Negative for chest pain and palpitations.  Gastrointestinal: Negative for vomiting.  Musculoskeletal: Negative for back pain.  Skin: Negative for rash.  Neurological: Negative for loss of consciousness and headaches.       Objective:    Physical Exam  Constitutional: She is oriented to person, place, and time. She appears well-developed and well-nourished. No distress.  HENT:  Head: Normocephalic and atraumatic.  Eyes: Conjunctivae are normal. Pupils are equal, round, and reactive to light.  Neck: Normal range of motion. No thyromegaly present.  Cardiovascular: Normal rate and regular rhythm.   Pulmonary/Chest: Effort normal and breath sounds normal. She has no wheezes.  Abdominal: Soft. Bowel sounds are normal. There is no  tenderness.  Musculoskeletal: Normal range of motion. She exhibits no edema or deformity.  Neurological: She is alert and oriented to person, place, and time.  Skin: Skin is warm and dry. She is not diaphoretic.  Psychiatric: She has a normal mood and affect.    BP (!) 142/82 (BP Location: Left Arm, Patient Position: Sitting, Cuff Size: Normal)   Pulse 63   Temp 98 F (36.7 C) (Oral)   Resp 16   Ht 6' (1.829 m)   Wt 185 lb (83.9 kg)   SpO2 98%   BMI 25.09 kg/m  Wt Readings from Last 3 Encounters:  05/06/16 185 lb (83.9 kg)  04/19/16 190 lb 3.2 oz (86.3 kg)  04/05/16 191 lb 12.8 oz (87 kg)   BP Readings from Last 3 Encounters:  05/06/16 (!) 142/82  04/19/16 135/68  04/10/16 (!) 181/105      There is no immunization history on file for this patient.  Health Maintenance  Topic Date Due  . HIV Screening  04/29/1983  . PAP SMEAR  04/28/1989  . FOOT EXAM  02/16/2016  . OPHTHALMOLOGY EXAM  04/17/2016  . PNEUMOCOCCAL POLYSACCHARIDE VACCINE (1) 04/05/2017 (Originally 04/29/1970)  . TETANUS/TDAP  04/05/2017 (Originally 04/29/1987)  . INFLUENZA VACCINE  08/28/2016  . HEMOGLOBIN A1C  08/28/2016    Lab Results  Component Value Date   WBC 7.9 04/10/2016   HGB 11.7 (L) 04/10/2016   HCT 35.5 (L) 04/10/2016   PLT 219 04/10/2016   GLUCOSE 112 (H) 04/10/2016   CHOL 170 02/29/2016   TRIG 74 02/29/2016   HDL 56 02/29/2016   LDLCALC 99 02/29/2016   ALT 17 04/01/2016   AST 15 04/01/2016   NA 135 04/10/2016   K 3.7 04/10/2016   CL 103 04/10/2016   CREATININE 1.22 (H) 04/10/2016   BUN 12 04/10/2016   CO2 25 04/10/2016   TSH 0.755 03/01/2016   INR 1.26 10/28/2015   HGBA1C 6.9 (H) 02/29/2016    Lab Results  Component Value Date   TSH 0.755 03/01/2016   Lab Results  Component Value Date   WBC 7.9 04/10/2016   HGB 11.7 (L) 04/10/2016   HCT 35.5 (L) 04/10/2016   MCV 80.9 04/10/2016   PLT 219 04/10/2016   Lab Results  Component Value Date   NA 135 04/10/2016   K 3.7  04/10/2016   CHLORIDE 105 04/01/2016   CO2 25 04/10/2016   GLUCOSE 112 (H) 04/10/2016   BUN 12 04/10/2016   CREATININE 1.22 (H) 04/10/2016   BILITOT <0.22 04/01/2016   ALKPHOS 88 04/01/2016   AST 15 04/01/2016   ALT 17 04/01/2016   PROT 7.0 04/01/2016   ALBUMIN 3.6 04/01/2016   CALCIUM 9.4 04/10/2016   ANIONGAP 7 04/10/2016   EGFR 58 (L) 04/01/2016   GFR 61.11  03/06/2016   Lab Results  Component Value Date   CHOL 170 02/29/2016   Lab Results  Component Value Date   HDL 56 02/29/2016   Lab Results  Component Value Date   LDLCALC 99 02/29/2016   Lab Results  Component Value Date   TRIG 74 02/29/2016   Lab Results  Component Value Date   CHOLHDL 3.0 02/29/2016   Lab Results  Component Value Date   HGBA1C 6.9 (H) 02/29/2016         Assessment & Plan:   Problem List Items Addressed This Visit      Unprioritized   Essential hypertension - Primary   Relevant Medications   amLODipine (NORVASC) 10 MG tablet    con'tlisinopril and inc norvasc to 10 mg daily Nurse visit in 2-3 weeks rto 3 months  I have discontinued Ms. Reagle's amLODipine. I am also having her start on amLODipine. Additionally, I am having her maintain her ACCU-CHEK GUIDE, ACCU-CHEK FASTCLIX LANCETS, glucose blood, insulin NPH-regular Human, aspirin, levocetirizine, fluticasone, atorvastatin, and lisinopril.  Meds ordered this encounter  Medications  . amLODipine (NORVASC) 10 MG tablet    Sig: Take 1 tablet (10 mg total) by mouth daily.    Dispense:  90 tablet    Refill:  3    CMA served as scribe during this visit. History, Physical and Plan performed by medical provider. Documentation and orders reviewed and attested to.  Ann Held, DO   Patient ID: Stephanie Lloyd, female   DOB: 03-May-1970, 46 y.o.   MRN: [id number]

## 2016-05-07 ENCOUNTER — Ambulatory Visit: Payer: Self-pay

## 2016-05-08 ENCOUNTER — Encounter: Payer: Self-pay | Admitting: Family Medicine

## 2016-05-08 NOTE — Telephone Encounter (Signed)
error:[id number] ° °

## 2016-05-09 ENCOUNTER — Encounter: Payer: Self-pay | Admitting: Occupational Therapy

## 2016-05-09 ENCOUNTER — Ambulatory Visit: Payer: Medicaid Other

## 2016-05-09 ENCOUNTER — Ambulatory Visit: Payer: Medicaid Other | Admitting: Occupational Therapy

## 2016-05-09 ENCOUNTER — Encounter: Payer: Self-pay | Admitting: Rehabilitation

## 2016-05-09 ENCOUNTER — Ambulatory Visit: Payer: Medicaid Other | Admitting: Rehabilitation

## 2016-05-09 ENCOUNTER — Telehealth: Payer: Self-pay | Admitting: Rehabilitation

## 2016-05-09 DIAGNOSIS — R1312 Dysphagia, oropharyngeal phase: Secondary | ICD-10-CM

## 2016-05-09 DIAGNOSIS — M6281 Muscle weakness (generalized): Secondary | ICD-10-CM

## 2016-05-09 DIAGNOSIS — R2681 Unsteadiness on feet: Secondary | ICD-10-CM

## 2016-05-09 DIAGNOSIS — R278 Other lack of coordination: Secondary | ICD-10-CM

## 2016-05-09 DIAGNOSIS — I639 Cerebral infarction, unspecified: Secondary | ICD-10-CM

## 2016-05-09 DIAGNOSIS — R41841 Cognitive communication deficit: Secondary | ICD-10-CM

## 2016-05-09 DIAGNOSIS — R471 Dysarthria and anarthria: Secondary | ICD-10-CM

## 2016-05-09 DIAGNOSIS — I69318 Other symptoms and signs involving cognitive functions following cerebral infarction: Secondary | ICD-10-CM

## 2016-05-09 DIAGNOSIS — I69354 Hemiplegia and hemiparesis following cerebral infarction affecting left non-dominant side: Secondary | ICD-10-CM

## 2016-05-09 DIAGNOSIS — R2689 Other abnormalities of gait and mobility: Secondary | ICD-10-CM

## 2016-05-09 NOTE — Patient Instructions (Addendum)
Tandem Walking    Walk with each foot directly in front of other, heel of one foot touching toes of other foot with each step. Both feet straight ahead.  When you get to the end of the counter, repeat heel to toe backwards.  Make sure you have counter top or back of couch to hold onto for safety, but use counter as lightly as possible.   Repeat x 3 reps down and back    Copyright  VHI. All rights reserved.   "I love a Parade" Lift    Do this along counter top or along back of couch.  March forward, raising knees as high as you can, go slow!  When you get to the end, go backwards, but make sure you are still taking big steps.   Repeat x 3 reps down and back.   http://gt2.exer.us/345   Copyright  VHI. All rights reserved.     For the next two:  Stand in corner with chair in front of you for safety.   Feet Together (Compliant Surface) Arm Motion - Eyes Closed    Stand on compliant surface: ____pillow____ with feet together. Close eyes and move arms up and down: to front. Repeat __3__ times per session for 30 secs each. Do __1-2__ sessions per day.  Copyright  VHI. All rights reserved.    Feet Together (Compliant Surface) Head Motion - Eyes Open    With eyes open, standing on compliant surface: ___pillow_____, feet together, move head slowly: up and down x 10 reps and side to side x 10 reps.  Repeat __1__ times per session. Do _1-2___ sessions per day.  Copyright  VHI. All rights reserved.

## 2016-05-09 NOTE — Therapy (Signed)
[facility] [address] [place], [place], [address] Phone: [phone]   Fax:  [phone]  Occupational Therapy Treatment  Patient Details  Name: Stephanie Lloyd MRN: [id number] Date of Birth: [birth year] Referring Provider: Dr. Lyndal Pulley  Encounter Date: 05/09/2016      OT End of Session - 05/09/16 1709    Visit Number 5   Number of Visits 9   Date for OT Re-Evaluation 05/12/16   Authorization Type MCD - pt approved for 8 visits by 06/23/2016   Authorization - Visit Number 5   Authorization - Number of Visits 9   OT Start Time 2482   OT Stop Time 1100   OT Time Calculation (min) 42 min   Activity Tolerance Patient tolerated treatment well      Past Medical History:  Diagnosis Date  . Diabetes ([place])   . Diabetes mellitus without complication ([place])   . Diastolic dysfunction   . Hypertension   . Immune deficiency disorder ([place])   . Menometrorrhagia 04/01/2016  . Stroke [facility])     Past Surgical History:  Procedure Laterality Date  . NO PAST SURGERIES      There were no vitals filed for this visit.      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 No/denies                      OT Treatments/Exercises (OP) - 05/09/16 0001      ADLs   Cooking Addressed cooking with pt today (husband present).  Pt prepared scrambled eggs.  Pt able to identify what items she needed and was safe from a moblity standpoint. WIth purposefuly distraction by therapist while pan was heating, pt did not attend to the fact that the butter in the pan was burning despite standing right beside stove.  Pt with  impaired alternating and divided attention for simple familiar functional tasks.  Pt able to clean up after activity and demonstrated good safety in managing hot pan and turning off stove top.  Discussed with pt and husband that pt would be safe to begin to do simple, familiar hot meal prep as long as husband is providing close supervision and kitchen is quiet while pt cooks.  Discussed different types of attention and provided feedback to pt. Pt and husband verbalized understanding.                      OT Long Term Goals - 05/09/16 1708      OT LONG TERM GOAL #1   Title Independent with coordination HEP and putty HEP    Baseline DEPENDENT   Time 8   Period Weeks   Status Achieved     OT LONG TERM GOAL #2   Title Improve coordination bilaterally as evidenced by reducing speed on 9 hole peg test by 5 sec. or more   Baseline eval: Rt = 43.25 sec, Lt = 45.57 sec   Time 8   Period Weeks   Status Achieved  R= 32.47, L = 34.60     OT LONG TERM GOAL #3  Title Grip strength Rt dominant hand to be 45 lbs or greater to open jars/containers   Baseline eval = 38 lbs (Lt = 50 lbs)    Time 8   Period Weeks   Status Achieved  R= 55, L= 65     OT LONG TERM GOAL #4   Title Pt/family to verbalize understanding with memory compensatory strategies and how to implement    Baseline dependent   Time 8   Period Weeks   Status Partially Met  currently working on identifying strategies to assist and then will help pt and husband to implement     OT LONG TERM GOAL #5   Title Pt to fully return to laundry tasks and consistently preparing snacks, cold meal, microwaveable items    Baseline dependent since recent stroke   Time 8   Period Weeks   Status Partially Met  pt is doing laundry, preparing snack and cold meal prep. Pt is not currently cooking or using microwave yet     OT LONG TERM GOAL #6   Title Cognition goal TBD prn    Baseline (based on MOCA score next visit)    Time 8    Period Weeks   Status Achieved  Pts scored 22/30 on MOCA  Pt's MOCA score showed predominately ST memory and working memory deficits as well as language issues . ST addressing language issues.  See  goal #4 above.                 Plan - 05/09/16 1708    Clinical Impression Statement Pt progressing toward goals. Pt able to complete simple cooking with close supervision in quiet environment.   Rehab Potential Fair   Clinical Impairments Affecting Rehab Potential premorbid status (previous CVA's x2), limitied visits with insurance   OT Frequency 1x / week   OT Duration 8 weeks   OT Treatment/Interventions Self-care/ADL training;Neuromuscular education;Therapeutic exercise;Cognitive remediation/compensation;Therapeutic activities;Patient/family education;Visual/perceptual remediation/compensation   Plan continue with cooking, UE functional use, memory strategies   Consulted and Agree with Plan of Care Patient   Family Member Consulted husband      Patient will benefit from skilled therapeutic intervention in order to improve the following deficits and impairments:  Decreased coordination, Decreased endurance, Decreased safety awareness, Decreased activity tolerance, Decreased knowledge of precautions, Impaired tone, Decreased balance, Decreased knowledge of use of DME, Impaired UE functional use, Decreased cognition, Decreased mobility, Decreased strength, Impaired perceived functional ability, Impaired vision/preception  Visit Diagnosis: Hemiplegia and hemiparesis following cerebral infarction affecting left non-dominant side (HCC)  Unsteadiness on feet  Muscle weakness (generalized)  Other symptoms and signs involving cognitive functions following cerebral infarction  Other lack of coordination    Problem List Patient Active Problem List   Diagnosis Date Noted  . Iron deficiency anemia due to chronic blood loss 04/01/2016  . Menometrorrhagia 04/01/2016  . Upper respiratory  infection 03/25/2016  . History of stroke   . History of intracerebral hemorrhage without residual deficit   . Cerebrovascular accident (CVA) with involvement of left side of body ([place]) 02/28/2016  . Ischemic stroke ([place]) 02/28/2016  . Acute kidney injury ([place]) 10/28/2015  . Acute encephalopathy 10/28/2015  . Diastolic dysfunction   . Stroke (cerebrum) ([place])   . Focal infarction of brain ([place]) 10/08/2015  . Syncope 08/19/2015  . Hypertensive emergency 07/29/2015  . Hypertensive urgency 07/28/2015  . Hypokalemia 07/28/2015  . Accelerated hypertension 09/21/2014  . Type 2 diabetes mellitus with other circulatory complications 89/21/1941  . Hyperlipidemia  09/21/2014  . 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 Hyperlipidemia LDL goal <70 07/29/2014  . Hemorrhagic stroke ([place]) 06/22/2014  . ICH (intracerebral hemorrhage) ([place]) 06/22/2014  . Essential hypertension 05/13/2012  . Diabetes mellitus ([place]) 05/13/2012    Quay Burow, OTR/L 05/09/2016, 5:10 PM  [facility] [address] [place], [place], [address] Phone: [phone]   Fax:  [phone]  Name: KANDISE RIEHLE MRN: [id number] Date of Birth: October 11, 1970

## 2016-05-09 NOTE — Patient Instructions (Signed)
  3-ring binder - SECTIONS: - Appointments/Schedule - Homework - Journal - record things that happen, details of conversations - To-do list

## 2016-05-09 NOTE — Telephone Encounter (Signed)
Dr. Zola Button,   I am working with Stephanie Lloyd at OP neuro for PT. She would benefit from [facility] use when in community and busier environments due to balance deficits.  Please write order in [facility] or fax order for [facility] to [phone], whichever is easier for you.    Thanks,  [name], PT, MPT [facility] [address] [place], [place], [address] Phone: [phone]   Fax:  [phone] 05/09/16, 12:44 PM

## 2016-05-09 NOTE — Therapy (Signed)
[facility] [address] [place], [place], [address] Phone: [phone]   Fax:  [phone]  Physical Therapy Treatment  Patient Details  Name: Stephanie Lloyd MRN: [id number] Date of Birth: 05/05/1970 Referring Provider: Ann Held, DO  Encounter Date: 05/09/2016      PT End of Session - 05/09/16 1230    Visit Number 4   Number of Visits 9   Date for PT Re-Evaluation 06/16/16   Authorization Type MCD-8 visits approved from 2/19-5/27/18   Authorization - Visit Number 3   Authorization - Number of Visits 8   PT Start Time 1100   PT Stop Time 1146   PT Time Calculation (min) 46 min   Activity Tolerance Treatment limited secondary to medical complications (Comment);Patient tolerated treatment well   Behavior During Therapy [facility] for tasks assessed/performed      Past Medical History:  Diagnosis Date  . Diabetes ([place])   . Diabetes mellitus without complication ([place])   . Diastolic dysfunction   . Hypertension   . Immune deficiency disorder ([place])   . Menometrorrhagia 04/01/2016  . Stroke [facility])     Past Surgical History:  Procedure Laterality Date  . NO PAST SURGERIES      There were no vitals filed for this visit.      Subjective Assessment - 05/09/16 1100    Subjective They increased medication for BP again on Monday.     Patient is accompained by: Family member   Pertinent History Goes by "[employer]" (husband Louie Casa)    Limitations House hold activities;Walking;Standing   Currently in Pain? No/denies                         [facility] Adult PT Treatment/Exercise - 05/09/16 0001      Ambulation/Gait   Ambulation/Gait Yes   Ambulation/Gait Assistance 5: Supervision   Ambulation/Gait Assistance Details Worked on gait for improved quality, increased cadence and increased stride length.  Performed 400' over indoor surfaces with auditory cues from PT on cadence.  Pt did very well, however  noted that arm swing tended to decrease esp with distractions and also she slowed down with busier area in gym.  Utilized walking poles during part of gait in order to improve arm swing.  Pt did very well and did very well carrying over when poles taken away.     Ambulation Distance (Feet) 400 Feet   Assistive device None   Gait Pattern Step-through pattern;Decreased arm swing - right;Decreased arm swing - left;Decreased stride length;Decreased hip/knee flexion - right;Decreased hip/knee flexion - left;Lateral hip instability;Shuffle;Narrow base of support   Ambulation Surface Level;Indoor   Gait Comments Husband states that he is making her a cane to use.  Discussed that she likely on needs cane in busier environment for safety, but would not need at home.  He would like PT to request order for cane until this one can be made.  PT to follow up with MD.       Neuro Re-ed    Neuro Re-ed Details  counter top and corner balance tasks, see pt instruction for details.  Also performed feet apart and together with eyes closed on solid ground prior to compliant surface, however this did not provide enough challenge.  Also worked on high level balance standing on red therapy mats tapping alternating LEs to cones x 15 reps progressing to tipping cones over and back upright to increase time spent in  SLS.  Ended with increased dual task challenge recalling sequence tapping to cone and then stepping and bringing foot all the way to next cone.  Pt with marked difficulty and needing cues each rep.  Also note increased challenge following tandem walking as well which seemed to be motor planning in nature.                  PT Education - 05/09/16 1230    Education provided Yes   Education Details balance HEP   Person(s) Educated Patient;Spouse   Methods Explanation;Demonstration;Handout   Comprehension Verbalized understanding;Returned demonstration          PT Short Term Goals - 03/14/16 1233       PT SHORT TERM GOAL #1   Title Pt will perform initial HEP with mod I using paper handout to indicate safe compliance, maximize functional gains. Target date: Following 4th visit after eval)   Baseline dependent for current program   Time 4   Period Weeks   Status New     PT SHORT TERM GOAL #2   Title Pt will improve BERG balance score to >52/56 in order to indicate decreased fall risk.     Baseline 46/56 on 03/14/16   Time 4   Period Weeks   Status New     PT SHORT TERM GOAL #3   Title Pt will increase gait speed to 1.59 ft/sec in order to indicate decreased fall risk and improved efficiency of gait.     Baseline .99 ft/sec on 03/14/16   Time 4   Period Weeks   Status New     PT SHORT TERM GOAL #4   Title Will assess 6MWT and improve 150' from baseline in order to indicate improved functional endurnace.    Baseline did not have time to assess on eval   Time 4   Period Weeks   Status New     PT SHORT TERM GOAL #5   Title Pt will perform floor recovery transfer at mod I level in order to indicate safe transfer from floor and safety getting up from air mattress.     Baseline requires assist from husband and RW at this time   Time 4   Period Weeks   Status New     Additional Short Term Goals   Additional Short Term Goals Yes     PT SHORT TERM GOAL #6   Title Pt will ambulate up to 200' over indoor surfaces with SPC at S level in order to indicate increased independence with household gait.    Baseline S to mod I level with RW over indoor surfaces   Time 4   Period Weeks   Status New     PT SHORT TERM GOAL #7   Title Pt will improve 5TSS time to <20 secs without UE support in order to indicate improved functional endurance.     Baseline 29.50 secs with UE support on lap           PT Long Term Goals - 05/09/16 1233      PT LONG TERM GOAL #1   Title Pt will be independent with handout with final HEP in order to indicate improved functional mobility.  (Target Date:  following 8th visit after eval or by 06/23/16)   Baseline Pt is participating in informal walking program, will need to go over formal HEP in future visits.     Time 8   Period Weeks   Status On-going  PT LONG TERM GOAL #2   Title Will perform FGA as able and set goal as appropriate to indicate improved balance.     Baseline 16/30 on FGA on 05/02/16   Time 8   Period Weeks   Status Achieved     PT LONG TERM GOAL #3   Title Pt will improve gait speed to 2.19 ft/sec in order to indicate decreased fall risk and improved efficiency of gait.     Baseline Pt has improved gait speed to 1.45 ft/sec without AD, however this is still indicative of fall risk and decreased ability to ambulate at community level.     Time 8   Period Weeks   Status Not Met     PT LONG TERM GOAL #4   Title Pt will ambulate up to 500' over unlevel paved surfaces (including ramp/curb) w/ SPC or LRAD at S level (due to cognition) in order to improve community negotiation.     Baseline 500' over unlevel paved surfaces without AD at S level, would like to update to mod I level due to progress.    Time 8   Period Weeks   Status Achieved     PT LONG TERM GOAL #5   Title Pt will improve 6MWT by 300' from baseline in order to indicate improved functional endurance.     Baseline Pt went from 36' at baseline to 876' an increase of 197'   Time 8   Period Weeks   Status On-going     PT LONG TERM GOAL #6   Title Pt will perform 5TSS in </=15 secs without UE support in order to indicate improved functional strength.     Baseline 16.22 secs without UE support, still indicative of decreased functional strength   Time 8   Period Weeks   Status On-going     PT LONG TERM GOAL #7   Title Pt will improve FGA to >/= 19/30 on FGA in order to indicate decreased fall risk.    Time 8   Period Weeks   Status New               Plan - 05/09/16 1231    Clinical Impression Statement Skilled session focused on high level  balance and providing pt with HEP regarding balance deficits, see pt instruction.  Also continue to provide education and practice on improved gait for speed and stride length.  Tolerated well.    Rehab Potential Good   PT Frequency 1x / week   PT Duration 6 weeks   PT Treatment/Interventions ADLs/Self Care Home Management;Electrical Stimulation;DME Instruction;Gait training;Stair training;Functional mobility training;Therapeutic activities;Therapeutic exercise;Balance training;Neuromuscular re-education;Patient/family education;Orthotic Fit/Training;Energy conservation;Vestibular;Visual/perceptual remediation/compensation   PT Next Visit Plan  balance, gait for improved speed and stride length, endurance.    PT Home Exercise Plan See pt instruction from 03/14/16   Consulted and Agree with Plan of Care Patient      Patient will benefit from skilled therapeutic intervention in order to improve the following deficits and impairments:  Abnormal gait, Decreased activity tolerance, Decreased balance, Decreased cognition, Decreased endurance, Decreased knowledge of use of DME, Decreased mobility, Decreased strength, Impaired perceived functional ability, Impaired flexibility, Impaired UE functional use, Improper body mechanics, Impaired vision/preception, Postural dysfunction  Visit Diagnosis: Hemiplegia and hemiparesis following cerebral infarction affecting left non-dominant side (HCC)  Unsteadiness on feet  Other abnormalities of gait and mobility  Muscle weakness (generalized)     Problem List Patient Active Problem List   Diagnosis Date Noted  .  Iron deficiency anemia due to chronic blood loss 04/01/2016  . Menometrorrhagia 04/01/2016  . Upper respiratory infection 03/25/2016  . History of stroke   . History of intracerebral hemorrhage without residual deficit   . Cerebrovascular accident (CVA) with involvement of left side of body ([place]) 02/28/2016  . Ischemic stroke ([place]) 02/28/2016   . Acute kidney injury ([place]) 10/28/2015  . Acute encephalopathy 10/28/2015  . Diastolic dysfunction   . Stroke (cerebrum) ([place])   . Focal infarction of brain ([place]) 10/08/2015  . Syncope 08/19/2015  . Hypertensive emergency 07/29/2015  . Hypertensive urgency 07/28/2015  . Hypokalemia 07/28/2015  . Accelerated hypertension 09/21/2014  . Type 2 diabetes mellitus with other circulatory complications 03/79/4446  . Hyperlipidemia 09/21/2014  . 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 Hyperlipidemia LDL goal <70 07/29/2014  . Hemorrhagic stroke ([place]) 06/22/2014  . ICH (intracerebral hemorrhage) ([place]) 06/22/2014  . Essential hypertension 05/13/2012  . Diabetes mellitus ([place]) 05/13/2012   Cameron Sprang, PT, MPT [facility] [address] [place], [place], [address] Phone: [phone]   Fax:  [phone] 05/09/16, 12:40 PM  Name: Stephanie Lloyd MRN: [id number] Date of Birth: 07-23-1970

## 2016-05-09 NOTE — Therapy (Signed)
[facility] [address] [place], [place], [address] Phone: [phone]   Fax:  [phone]  Speech Language Pathology Treatment  Patient Details  Name: Stephanie Lloyd MRN: [id number] Date of Birth: September 23, 1970 Referring Provider: Roma Schanz, MD  Encounter Date: 05/09/2016      End of Session - 05/09/16 1043    Visit Number 6   Number of Visits 9   Date for SLP Re-Evaluation 06/14/16   Authorization Type MEDICAID   Authorization Time Period expires 06-16-16   Authorization - Visit Number 6   Authorization - Number of Visits 9   SLP Start Time 6962   SLP Stop Time  1016   SLP Time Calculation (min) 43 min   Activity Tolerance Patient tolerated treatment well      Past Medical History:  Diagnosis Date  . Diabetes ([facility])   . Diabetes mellitus without complication ([place])   . Diastolic dysfunction   . Hypertension   . Immune deficiency disorder ([place])   . Menometrorrhagia 04/01/2016  . Stroke [facility])     Past Surgical History:  Procedure Laterality Date  . NO PAST SURGERIES      There were no vitals filed for this visit.      Subjective Assessment - 05/09/16 0939    Subjective Pt with mildly dysarthric speech today.   Patient is accompained by: Louie Casa   Currently in Pain? No/denies               ADULT SLP TREATMENT - 05/09/16 0940      General Information   Behavior/Cognition Alert;Cooperative;Pleasant mood     Treatment Provided   Treatment provided Cognitive-Linquistic     Cognitive-Linquistic Treatment   Treatment focused on Cognition   Skilled Treatment SLP educated pt and husband re: memory compensation strategy - 3-ring binder. Discussed what sections will be most helpful to pt, and how each section would benefit pt. SLP worked on [employer] as well by reviewing her HEP with her - she req'd cues for memory on procedure of exercises rarely.      Assessment / Recommendations /  Plan   Plan Continue with current plan of care     Progression Toward Goals   Progression toward goals Progressing toward goals          SLP Education - 05/09/16 1043    Education provided Yes   Education Details compensations for memory   Person(s) Educated Patient;Spouse   Methods Explanation;Demonstration;Verbal cues   Comprehension Need further instruction;Verbalized understanding;Returned demonstration;Verbal cues required          SLP Short Term Goals - 05/09/16 1048      SLP SHORT TERM GOAL #1   Title pt will complete dysarthria HEP with occasional min A over two sessions   Baseline 03/29/16   Status Achieved  pt has completed HEP correctly in one/two sessions     SLP SHORT TERM GOAL #2   Title pt will complete dysphagia HEP with occasional min A over two sessions   Status Partially Met  pt has completed correctly one/two sessions     SLP SHORT TERM GOAL #3   Title pt will report less frequent overt s/s aspiration at home   Status Achieved     SLP SHORT TERM GOAL #4   Title pt will demo dysarthria compensations in 15/20 sentence responses   Time 1   Status Achieved  GOAL MET 05-02-16  SLP Long Term Goals - 05/09/16 1049      SLP LONG TERM GOAL #1   Title pt will complete HEP for dysarthria with rare min A over two sessions   Baseline --   Time 4   Period Weeks   Status On-going  completed in 1/2 sessions     SLP LONG TERM GOAL #2   Title pt will complete HEP for dysphagia with rare min A over two sessions   Baseline --   Time 4   Period Weeks   Status On-going  met in 1/2 sessions     SLP LONG TERM GOAL #3   Title pt will follow swallow precautions with POs with modified independence over two sessions   Status Achieved  met 05-02-16     SLP LONG TERM GOAL #4   Title pt will demo speech intelligibliity compensations with occasional min A in 10 minutes simple to mod complex conversation   Baseline --   Time 4   Period Weeks   Status  On-going  accomplshed in strucutred speech tasks.          Plan - 05/09/16 1229    Clinical Impression Statement Pt and husband were educated today regarding memory compensations and pt/husband thought 3-ring binder would be most helpful with different sections. Cont'd skilled ST targeting speech intelligibilty and swallowing, and cognition/memory.   Speech Therapy Frequency 1x /week   Duration --  8 weeks   Treatment/Interventions Aspiration precaution training;Pharyngeal strengthening exercises;Diet toleration management by SLP;Oral motor exercises;Compensatory strategies;Functional tasks;Cueing hierarchy;Patient/family education;Multimodal communcation approach;Internal/external aids;SLP instruction and feedback;Compensatory techniques   Potential to Achieve Goals Good   Potential Considerations Previous level of function;Severity of impairments;Financial resources   Consulted and Agree with Plan of Care Patient      Patient will benefit from skilled therapeutic intervention in order to improve the following deficits and impairments:   Dysarthria and anarthria  Dysphagia, oropharyngeal phase  Cognitive communication deficit    Problem List Patient Active Problem List   Diagnosis Date Noted  . Iron deficiency anemia due to chronic blood loss 04/01/2016  . Menometrorrhagia 04/01/2016  . Upper respiratory infection 03/25/2016  . History of stroke   . History of intracerebral hemorrhage without residual deficit   . Cerebrovascular accident (CVA) with involvement of left side of body ([place]) 02/28/2016  . Ischemic stroke ([place]) 02/28/2016  . Acute kidney injury ([place]) 10/28/2015  . Acute encephalopathy 10/28/2015  . Diastolic dysfunction   . Stroke (cerebrum) ([place])   . Focal infarction of brain ([place]) 10/08/2015  . Syncope 08/19/2015  . Hypertensive emergency 07/29/2015  . Hypertensive urgency 07/28/2015  . Hypokalemia 07/28/2015  . Accelerated hypertension 09/21/2014  . Type  2 diabetes mellitus with other circulatory complications 79/89/2119  . Hyperlipidemia 09/21/2014  . Thalamic hemorrhage with stroke ([place]) 07/29/2014  . Hyperlipidemia LDL goal <70 07/29/2014  . Hemorrhagic stroke ([place]) 06/22/2014  . ICH (intracerebral hemorrhage) ([place]) 06/22/2014  . Essential hypertension 05/13/2012  . Diabetes mellitus ([place]) 05/13/2012    [facility] ,[place], [place]  05/09/2016, 12:48 PM  [facility] [address] [place], [place], [address] Phone: [phone]   Fax:  [phone]   Name: Stephanie Lloyd MRN: [id number] Date of Birth: 1970-07-07

## 2016-05-10 NOTE — Telephone Encounter (Signed)
Irving Burton, I am covering for Dr. Laury Axon, can you please clarify "SPC" I am unsure of abbreviation. Is that a cane" thanks, Sandford Craze NP

## 2016-05-13 ENCOUNTER — Other Ambulatory Visit (HOSPITAL_BASED_OUTPATIENT_CLINIC_OR_DEPARTMENT_OTHER): Payer: Medicaid Other

## 2016-05-13 ENCOUNTER — Telehealth: Payer: Self-pay | Admitting: Family Medicine

## 2016-05-13 ENCOUNTER — Ambulatory Visit (HOSPITAL_BASED_OUTPATIENT_CLINIC_OR_DEPARTMENT_OTHER): Payer: Medicaid Other | Admitting: Family

## 2016-05-13 VITALS — BP 153/73 | HR 66 | Temp 97.8°F | Resp 16 | Wt 191.4 lb

## 2016-05-13 DIAGNOSIS — N921 Excessive and frequent menstruation with irregular cycle: Secondary | ICD-10-CM | POA: Diagnosis not present

## 2016-05-13 DIAGNOSIS — D500 Iron deficiency anemia secondary to blood loss (chronic): Secondary | ICD-10-CM

## 2016-05-13 LAB — CBC WITH DIFFERENTIAL (CANCER CENTER ONLY)
BASO#: 0 10*3/uL (ref 0.0–0.2)
BASO%: 0.4 % (ref 0.0–2.0)
EOS ABS: 0.3 10*3/uL (ref 0.0–0.5)
EOS%: 3.4 % (ref 0.0–7.0)
HEMATOCRIT: 36.5 % (ref 34.8–46.6)
HGB: 11.9 g/dL (ref 11.6–15.9)
LYMPH#: 2 10*3/uL (ref 0.9–3.3)
LYMPH%: 24.1 % (ref 14.0–48.0)
MCH: 27.7 pg (ref 26.0–34.0)
MCHC: 32.6 g/dL (ref 32.0–36.0)
MCV: 85 fL (ref 81–101)
MONO#: 0.5 10*3/uL (ref 0.1–0.9)
MONO%: 6.5 % (ref 0.0–13.0)
NEUT#: 5.4 10*3/uL (ref 1.5–6.5)
NEUT%: 65.6 % (ref 39.6–80.0)
Platelets: 239 10*3/uL (ref 145–400)
RBC: 4.3 10*6/uL (ref 3.70–5.32)
RDW: 14.6 % (ref 11.1–15.7)
WBC: 8.2 10*3/uL (ref 3.9–10.0)

## 2016-05-13 NOTE — Telephone Encounter (Signed)
Pt brought STD/LTD form to be filled out and signed by Zola Button. Pt req mail to address on form [place] [employer] and call pt to confirm form completed and mailed.

## 2016-05-13 NOTE — Progress Notes (Signed)
Hematology and Oncology Follow Up Visit  Stephanie Lloyd [id number] Dec 12, 1970 46 y.o. 05/13/2016   Principle Diagnosis:  1. Iron deficiency anemia secondary to menometrorrhagia   2. History of hemorrhagic stroke x 2 and ischemic stroke x 1   Current Therapy:   IV iron as indicated - last received in March 2018 x 2   Interim History:  Stephanie Lloyd is here today for follow-up. She received 2 does of IV iron last month. Iron saturation at that time was 14% with a ferritin of 8. Hgb is now 11.9 with an MCV of 85. Platelet count is 239.  She is still symptomatic with fatigued and chills. She states that she has been on her cycle now for a month. She describes this as heavy, no clot. She states that she has an appointment with gynecology next week regarding this.  She has had no other episodes of bleeding, bruising or petechiae.  No fever, n/v, cough, rash, dizziness, headache, vision changes, SOB, chest pain, palpitations, abdominal pain or changes in bowel or bladder habits.  No swelling or tenderness in her extremities. No c/o pain at this time. She has residual weakness on both sides due to previous strokes. Neuropathy in hands and feet is unchanged.  No falls or syncopal episodes. No lymphadenopathy found on exam.  She has a good appetite and is staying well hydrated. Her weight is stable.   ECOG Performance Status: 1 - Symptomatic but completely ambulatory  Medications:  Allergies as of 05/13/2016      Reactions   Metformin And Related Diarrhea      Medication List       Accurate as of 05/13/16  1:43 PM. Always use your most recent med list.          ACCU-CHEK FASTCLIX LANCETS Misc Check blood sugar twice daily. Dx E:11.9   ACCU-CHEK GUIDE w/Device Kit 1 Device by Does not apply route as directed. Check blood sugar daily   amLODipine 10 MG tablet Commonly known as:  NORVASC Take 1 tablet (10 mg total) by mouth daily.   aspirin 325 MG EC tablet Take 1 tablet (325 mg  total) by mouth daily.   atorvastatin 80 MG tablet Commonly known as:  LIPITOR Take 1 tablet (80 mg total) by mouth daily at 6 PM.   fluticasone 50 MCG/ACT nasal spray Commonly known as:  FLONASE Place 2 sprays into both nostrils daily.   glucose blood test strip Commonly known as:  ACCU-CHEK GUIDE Check blood sugar twice daily. Dx E:11.9   insulin NPH-regular Human (70-30) 100 UNIT/ML injection Commonly known as:  NOVOLIN 70/30 RELION Inject 15 Units into the skin 2 (two) times daily with a meal.   levocetirizine 5 MG tablet Commonly known as:  XYZAL Take 1 tablet (5 mg total) by mouth every evening.   lisinopril 40 MG tablet Commonly known as:  PRINIVIL,ZESTRIL Take 40 mg by mouth daily.       Allergies:  Allergies  Allergen Reactions  . Metformin And Related Diarrhea    Past Medical History, Surgical history, Social history, and Family History were reviewed and updated.  Review of Systems: All other 10 point review of systems is negative.   Physical Exam:  vitals were not taken for this visit.  Wt Readings from Last 3 Encounters:  05/06/16 185 lb (83.9 kg)  04/19/16 190 lb 3.2 oz (86.3 kg)  04/05/16 191 lb 12.8 oz (87 kg)    Ocular: Sclerae unicteric, pupils equal, round and  reactive to light Ear-nose-throat: Oropharynx clear, dentition fair Lymphatic: No cervical, supraclavicular or axillary adenopathy Lungs no rales or rhonchi, good excursion bilaterally Heart regular rate and rhythm, no murmur appreciated Abd soft, nontender, positive bowel sounds, no liver or spleen tip palpated on exam, no fluid wave  MSK no focal spinal tenderness, no joint edema Neuro: non-focal, well-oriented, appropriate affect Breasts: Deferred   Lab Results  Component Value Date   WBC 7.9 04/10/2016   HGB 11.7 (L) 04/10/2016   HCT 35.5 (L) 04/10/2016   MCV 80.9 04/10/2016   PLT 219 04/10/2016   Lab Results  Component Value Date   FERRITIN 8 (L) 04/01/2016   IRON 46  04/01/2016   TIBC 320 04/01/2016   UIBC 274 04/01/2016   IRONPCTSAT 14 (L) 04/01/2016   Lab Results  Component Value Date   RBC 4.39 04/10/2016   No results found for: KPAFRELGTCHN, LAMBDASER, KAPLAMBRATIO No results found for: IGGSERUM, IGA, IGMSERUM No results found for: Odetta Pink, SPEI   Chemistry      Component Value Date/Time   NA 135 04/10/2016 1310   NA 139 04/01/2016 0853   K 3.7 04/10/2016 1310   K 4.1 04/01/2016 0853   CL 103 04/10/2016 1310   CO2 25 04/10/2016 1310   CO2 26 04/01/2016 0853   BUN 12 04/10/2016 1310   BUN 14.7 04/01/2016 0853   CREATININE 1.22 (H) 04/10/2016 1310   CREATININE 1.3 (H) 04/01/2016 0853      Component Value Date/Time   CALCIUM 9.4 04/10/2016 1310   CALCIUM 9.4 04/01/2016 0853   ALKPHOS 88 04/01/2016 0853   AST 15 04/01/2016 0853   ALT 17 04/01/2016 0853   BILITOT <0.22 04/01/2016 0853      Impression and Plan: Stephanie Lloyd is a very pleasant 55 yo African American female with iron deficiency anemia secondary to menometrorrhagia. She is symptomatic at this time with fatigued and chills.  She has now been on her cycle for one month and has an appointment with gynecology next week.  Hgb is stable at 11.9 with an MCV of 85 and Platelet count of 239.  We will see what her iron studies show and bring her back in later this week for infusion if needed.  I spent 15 minutes face to face counseling the patient and her husband.  We will plan to see her back in 2 months for repeat lab work and follow-up.  Both she and her husband know to contact our office with any questions or concerns. We can certainly see her sooner if need be.   Eliezer Bottom, NP 4/16/20181:43 PM

## 2016-05-14 ENCOUNTER — Encounter: Payer: Self-pay | Admitting: Dietician

## 2016-05-14 ENCOUNTER — Encounter: Payer: Medicaid Other | Attending: Family Medicine | Admitting: Dietician

## 2016-05-14 ENCOUNTER — Ambulatory Visit: Payer: Medicaid Other | Admitting: Occupational Therapy

## 2016-05-14 ENCOUNTER — Encounter: Payer: Self-pay | Admitting: Occupational Therapy

## 2016-05-14 ENCOUNTER — Telehealth: Payer: Self-pay | Admitting: *Deleted

## 2016-05-14 DIAGNOSIS — Z794 Long term (current) use of insulin: Secondary | ICD-10-CM

## 2016-05-14 DIAGNOSIS — R2681 Unsteadiness on feet: Secondary | ICD-10-CM

## 2016-05-14 DIAGNOSIS — E1151 Type 2 diabetes mellitus with diabetic peripheral angiopathy without gangrene: Secondary | ICD-10-CM | POA: Diagnosis not present

## 2016-05-14 DIAGNOSIS — M6281 Muscle weakness (generalized): Secondary | ICD-10-CM

## 2016-05-14 DIAGNOSIS — I69318 Other symptoms and signs involving cognitive functions following cerebral infarction: Secondary | ICD-10-CM

## 2016-05-14 DIAGNOSIS — R278 Other lack of coordination: Secondary | ICD-10-CM

## 2016-05-14 DIAGNOSIS — E1165 Type 2 diabetes mellitus with hyperglycemia: Secondary | ICD-10-CM | POA: Diagnosis not present

## 2016-05-14 DIAGNOSIS — I69354 Hemiplegia and hemiparesis following cerebral infarction affecting left non-dominant side: Secondary | ICD-10-CM

## 2016-05-14 DIAGNOSIS — Z713 Dietary counseling and surveillance: Secondary | ICD-10-CM | POA: Insufficient documentation

## 2016-05-14 DIAGNOSIS — E1159 Type 2 diabetes mellitus with other circulatory complications: Secondary | ICD-10-CM

## 2016-05-14 LAB — IRON AND TIBC
%SAT: 39 % (ref 21–57)
Iron: 101 ug/dL (ref 41–142)
TIBC: 256 ug/dL (ref 236–444)
UIBC: 155 ug/dL (ref 120–384)

## 2016-05-14 LAB — FERRITIN: Ferritin: 174 ng/ml (ref 9–269)

## 2016-05-14 NOTE — Telephone Encounter (Addendum)
Patient aware of results.   ----- Message from Verdie Mosher, NP sent at 05/14/2016  9:22 AM EDT ----- Regarding: Iron  Iron studies look great! She does not need iron at this time! Thank you!  Sarah  ----- Message ----- From: Interface, Lab In Three Zero One Sent: 05/13/2016   1:52 PM To: Verdie Mosher, NP

## 2016-05-14 NOTE — Progress Notes (Signed)
Patient was seen on 05/14/16 for the first of a series of three diabetes self-management courses at [facility].  Patient Education Plan per assessed needs and concerns is to attend four course education program for Diabetes Self Management Education.  The following learning objectives were met by the patient during this class:  Describe diabetes  State some common risk factors for diabetes  Defines the role of glucose and insulin  Identifies type of diabetes and pathophysiology  Describe the relationship between diabetes and cardiovascular risk  State the members of the Healthcare Team  States the rationale for glucose monitoring  State when to test glucose  State their individual Target Range  State the importance of logging glucose readings  Describe how to interpret glucose readings  Identifies A1C target  Explain the correlation between A1c and eAG values  State symptoms and treatment of high blood glucose  State symptoms and treatment of low blood glucose  Explain proper technique for glucose testing  Identifies proper sharps disposal  Handouts given during class include:  Living Well with Diabetes book  Carb Counting and Meal Planning book  Meal Plan Card  Carbohydrate guide  Meal planning worksheet  Low Sodium Flavoring Tips  The diabetes portion plate  G8E to eAG Conversion Chart  Diabetes Medications  Diabetes Recommended Care Schedule  Support Group  Diabetes Success Plan  Core Class Satisfaction Survey  Follow-Up Plan:  Attend core 2

## 2016-05-14 NOTE — Therapy (Signed)
[facility] [address] [place], [place], [address] Phone: [phone]   Fax:  [phone]  Occupational Therapy Treatment  Patient Details  Name: Stephanie Lloyd MRN: [id number] Date of Birth: 03-08-1970 Referring Provider: Dr. Florina Ou  Encounter Date: 05/14/2016      OT End of Session - 05/14/16 1602    Visit Number 6   Number of Visits 9   Date for OT Re-Evaluation 05/12/16   Authorization Type MCD - pt approved for 8 visits by 06/23/2016   Authorization Time Period 03/18/16 - 05/12/16   Authorization - Visit Number 6   Authorization - Number of Visits 9   OT Start Time 1447   OT Stop Time 1530   OT Time Calculation (min) 43 min   Activity Tolerance Patient tolerated treatment well   Behavior During Therapy [facility] for tasks assessed/performed      Past Medical History:  Diagnosis Date  . Diabetes ([facility])   . Diabetes mellitus without complication ([facility])   . Diastolic dysfunction   . Hypertension   . Immune deficiency disorder ([facility])   . Menometrorrhagia 04/01/2016  . Stroke [facility])     Past Surgical History:  Procedure Laterality Date  . NO PAST SURGERIES      There were no vitals filed for this visit.      Subjective Assessment - 05/14/16 1454    Subjective  I have been doing some laundry and washing dishes, not really cooking   Patient is accompained by: Family member   Pertinent History Hemorrhagic CVA x2 (05/2014, 09/2015)    Limitations Thickened liquids, fall risk, (hasn't driven since 2016)    Patient Stated Goals be able to cook again   Currently in Pain? No/denies   Pain Score 0-No pain                      OT Treatments/Exercises (OP) - 05/14/16 0001      ADLs   Cooking Worked with patient in the kitchen to address goal relating to cooking.  Patient able to prepare a boxed rice dish with assistance to read instructions.  Patient discussed difficulties with cooking at home -  currently lives with sister-in-law, and several other extended family members and friends.  "That place is so busy."  Discussed options to find time with husband where they could prepare a simple meal together, where she could assist with cooking.  She did indicate that she has reheated leftovers in microwave for she and her husband.                   OT Education - 05/14/16 1601    Education provided Yes   Education Details discussed plans to assist husband with meal prep   Person(s) Educated Patient   Methods Explanation;Demonstration   Comprehension Need further instruction             OT Long Term Goals - 05/14/16 1525      Long Term Additional Goals   Additional Long Term Goals Yes               Plan - 05/14/16 1602    Clinical Impression Statement Patient showing steady progress toward goals.     Rehab Potential Fair   Clinical Impairments Affecting Rehab Potential premorbid status (previous CVA's x2), limitied visits with insurance   OT Frequency 1x / week   OT Duration 8 weeks   OT Treatment/Interventions Self-care/ADL training;Neuromuscular education;Therapeutic  exercise;Cognitive remediation/compensation;Therapeutic activities;Patient/family education;Visual/perceptual remediation/compensation   Plan UE functional use, IADL- Cooking, memory strategies   Consulted and Agree with Plan of Care Patient      Patient will benefit from skilled therapeutic intervention in order to improve the following deficits and impairments:  Decreased coordination, Decreased endurance, Decreased safety awareness, Decreased activity tolerance, Decreased knowledge of precautions, Impaired tone, Decreased balance, Decreased knowledge of use of DME, Impaired UE functional use, Decreased cognition, Decreased mobility, Decreased strength, Impaired perceived functional ability, Impaired vision/preception  Visit Diagnosis: Hemiplegia and hemiparesis following cerebral infarction  affecting left non-dominant side (HCC)  Unsteadiness on feet  Muscle weakness (generalized)  Other symptoms and signs involving cognitive functions following cerebral infarction  Other lack of coordination    Problem List Patient Active Problem List   Diagnosis Date Noted  . Iron deficiency anemia due to chronic blood loss 04/01/2016  . Menometrorrhagia 04/01/2016  . Upper respiratory infection 03/25/2016  . History of stroke   . History of intracerebral hemorrhage without residual deficit   . Cerebrovascular accident (CVA) with involvement of left side of body ([facility]) 02/28/2016  . Ischemic stroke ([facility]) 02/28/2016  . Acute kidney injury ([facility]) 10/28/2015  . Acute encephalopathy 10/28/2015  . Diastolic dysfunction   . Stroke (cerebrum) ([facility])   . Focal infarction of brain ([facility]) 10/08/2015  . Syncope 08/19/2015  . Hypertensive emergency 07/29/2015  . Hypertensive urgency 07/28/2015  . Hypokalemia 07/28/2015  . Accelerated hypertension 09/21/2014  . Type 2 diabetes mellitus with other circulatory complications ([facility]) 09/21/2014  . Hyperlipidemia 09/21/2014  . 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 Hyperlipidemia LDL goal <70 07/29/2014  . Hemorrhagic stroke ([facility]) 06/22/2014  . ICH (intracerebral hemorrhage) (HCC) 06/22/2014  . Essential hypertension 05/13/2012  . Diabetes mellitus ([facility]) 05/13/2012    Collier Salina, OTR/L 05/14/2016, 4:04 PM  [facility] [address] [place], [place], [address] Phone: [phone]   Fax:  [phone]  Name: Stephanie Lloyd MRN: [id number] Date of Birth: 1970/11/30

## 2016-05-16 ENCOUNTER — Ambulatory Visit: Payer: Medicaid Other | Admitting: Rehabilitation

## 2016-05-16 ENCOUNTER — Ambulatory Visit: Payer: Medicaid Other

## 2016-05-16 ENCOUNTER — Encounter: Payer: Self-pay | Admitting: Rehabilitation

## 2016-05-16 DIAGNOSIS — R2681 Unsteadiness on feet: Secondary | ICD-10-CM

## 2016-05-16 DIAGNOSIS — I69354 Hemiplegia and hemiparesis following cerebral infarction affecting left non-dominant side: Secondary | ICD-10-CM | POA: Diagnosis not present

## 2016-05-16 DIAGNOSIS — R2689 Other abnormalities of gait and mobility: Secondary | ICD-10-CM

## 2016-05-16 DIAGNOSIS — R1312 Dysphagia, oropharyngeal phase: Secondary | ICD-10-CM

## 2016-05-16 DIAGNOSIS — R471 Dysarthria and anarthria: Secondary | ICD-10-CM

## 2016-05-16 DIAGNOSIS — R41841 Cognitive communication deficit: Secondary | ICD-10-CM

## 2016-05-16 DIAGNOSIS — M6281 Muscle weakness (generalized): Secondary | ICD-10-CM

## 2016-05-16 NOTE — Therapy (Signed)
[facility] [address] [place], [place], [address] Phone: [phone]   Fax:  [phone]  Speech Language Pathology Treatment  Patient Details  Name: Stephanie Lloyd MRN: [id number] Date of Birth: 1970/04/23 Referring Provider: Roma Schanz, MD  Encounter Date: 05/16/2016      End of Session - 05/16/16 1116    Visit Number 7   Number of Visits 9   Date for SLP Re-Evaluation 06/14/16   Authorization Type MEDICAID   Authorization Time Period expires 06-16-16   Authorization - Visit Number 7   Authorization - Number of Visits 9   SLP Start Time 3710   SLP Stop Time  1103   SLP Time Calculation (min) 44 min   Activity Tolerance Patient tolerated treatment well      Past Medical History:  Diagnosis Date  . Diabetes ([place])   . Diabetes mellitus without complication ([place])   . Diastolic dysfunction   . Hypertension   . Immune deficiency disorder ([place])   . Menometrorrhagia 04/01/2016  . Stroke [facility])     Past Surgical History:  Procedure Laterality Date  . NO PAST SURGERIES      There were no vitals filed for this visit.      Subjective Assessment - 05/16/16 1024    Subjective Pt cont with mildly dysarthric speech today.   Currently in Pain? No/denies  "Only if I move my arm"               ADULT SLP TREATMENT - 05/16/16 1025      General Information   Behavior/Cognition Alert;Cooperative;Pleasant mood     Dysphagia Treatment   Temperature Spikes Noted No   Respiratory Status Room air   Oral Cavity - Dentition Adequate natural dentition   Treatment Methods Therapeutic exercise;Skilled observation;Compensation strategy training   Patient observed directly with PO's Yes   Type of PO's observed Dysphagia 3 (soft);Thin liquids   Liquids provided via Cup   Other treatment/comments Pt reports the frequency of cough has not decr'd compared to 2 weeks ago. SLP observed pt with dysIII/thin  today without overt s/s aspiration noted. With her HEP for swallowing, pt req'd to look at her handout for each exercise. When asked, pt stated she completed HEP for dysphagia approx 1-2 week. She told SLP she doesn't mind coughing once/twice a day. SLP reiterated that coughing indicates likely food or liquid entering airway and with acute decondidtioning may lead to aspiration PNA with repeated aspiration.     Cognitive-Linquistic Treatment   Treatment focused on Dysarthria   Skilled Treatment Pt has not obtained 3-ring binder. SLP worked with pt on dysarthria exercises, which she stated of her 3 sets of exercises (PT, swallow, dysarthria) she does the most. Pt did not need to read these exercises and completed with rare min A. Pt did not use speech compensations during session unless cued to do so from SLP, and then for approx 30 seconds.     Assessment / Recommendations / Plan   Plan Continue with current plan of care     Progression Toward Goals   Progression toward goals Progressing toward goals          SLP Education - 05/16/16 1114    Education provided Yes   Education Details procedure of dysarthria HEP, need to complete all sets of HEPs as directed, rationale for doing ST HEPs, danger of aspiration PNA   Person(s) Educated Patient   Methods Explanation;Demonstration;Verbal cues  Comprehension Verbalized understanding;Returned demonstration;Verbal cues required;Need further instruction          SLP Short Term Goals - 05/09/16 1048      SLP SHORT TERM GOAL #1   Title pt will complete dysarthria HEP with occasional min A over two sessions   Baseline 03/29/16   Status Achieved  pt has completed HEP correctly in one/two sessions     SLP [place] #2   Title pt will complete dysphagia HEP with occasional min A over two sessions   Status Partially Met  pt has completed correctly one/two sessions     SLP SHORT TERM GOAL #3   Title pt will report less frequent overt s/s  aspiration at home   Status Achieved     SLP SHORT TERM GOAL #4   Title pt will demo dysarthria compensations in 15/20 sentence responses   Time 1   Status Achieved  GOAL MET 05-02-16          SLP Long Term Goals - 05/16/16 1119      SLP LONG TERM GOAL #1   Title pt will complete HEP for dysarthria with rare min A over two sessions   Status Achieved  completed in 1/2 sessions     SLP LONG TERM GOAL #2   Title pt will complete HEP for dysphagia with rare min A over two sessions   Time 4   Period Weeks   Status On-going  met in 1/2 sessions     SLP LONG TERM GOAL #3   Title pt will follow swallow precautions with POs with modified independence over two sessions   Status Achieved  met 05-02-16     SLP LONG TERM GOAL #4   Title pt will demo speech intelligibliity compensations with occasional min A in 5 minutes simple conversation   Time 4   Period Weeks   Status Revised  accomplshed in strucutred speech tasks.          Plan - 05/16/16 1117    Clinical Impression Statement Although pt/husband thought 3-ring binder would be most helpful to manage pt's memory deficits with different sections to organize, pt has not obtained this in the last 7 days. She admitted to SLP she was completing swallowing HEP approx x2/week, her PT HEP a little more frequently, and her speech HEP the most frequently at 4-5 days/week. SLP reiterated to pt the need to complete all HEPs as directed. Cont'd skilled ST targeting speech intelligibilty and swallowing, and cognition/memory.   Speech Therapy Frequency 1x /week   Duration --  8 weeks   Treatment/Interventions Aspiration precaution training;Pharyngeal strengthening exercises;Diet toleration management by SLP;Oral motor exercises;Compensatory strategies;Functional tasks;Cueing hierarchy;Patient/family education;Multimodal communcation approach;Internal/external aids;SLP instruction and feedback;Compensatory techniques   Potential to Achieve Goals  Good   Potential Considerations Previous level of function;Severity of impairments;Financial resources   Consulted and Agree with Plan of Care Patient      Patient will benefit from skilled therapeutic intervention in order to improve the following deficits and impairments:   Dysarthria and anarthria  Dysphagia, oropharyngeal phase  Cognitive communication deficit    Problem List Patient Active Problem List   Diagnosis Date Noted  . Iron deficiency anemia due to chronic blood loss 04/01/2016  . Menometrorrhagia 04/01/2016  . Upper respiratory infection 03/25/2016  . History of stroke   . History of intracerebral hemorrhage without residual deficit   . Cerebrovascular accident (CVA) with involvement of left side of body ([place]) 02/28/2016  . Ischemic  stroke ([place]) 02/28/2016  . Acute kidney injury ([place]) 10/28/2015  . Acute encephalopathy 10/28/2015  . Diastolic dysfunction   . Stroke (cerebrum) ([place])   . Focal infarction of brain ([place]) 10/08/2015  . Syncope 08/19/2015  . Hypertensive emergency 07/29/2015  . Hypertensive urgency 07/28/2015  . Hypokalemia 07/28/2015  . Accelerated hypertension 09/21/2014  . Type 2 diabetes mellitus with other circulatory complications ([place]) 79/15/0413  . Hyperlipidemia 09/21/2014  . Thalamic hemorrhage with stroke ([place]) 07/29/2014  . Hyperlipidemia LDL goal <70 07/29/2014  . Hemorrhagic stroke ([place]) 06/22/2014  . ICH (intracerebral hemorrhage) ([place]) 06/22/2014  . Essential hypertension 05/13/2012  . Diabetes mellitus ([place]) 05/13/2012    [place] ,[place], [place]  05/16/2016, 11:21 AM  [facility] [address] [place], [place], [address] Phone: [phone]   Fax:  [phone]   Name: Stephanie Lloyd MRN: [id number] Date of Birth: 03-18-1970

## 2016-05-16 NOTE — Therapy (Signed)
[facility] [address] [place], [place], [address] Phone: [phone]   Fax:  [phone]  Physical Therapy Treatment  Patient Details  Name: Stephanie Lloyd MRN: [id number] Date of Birth: [birth year] Referring Provider: Ann Held, DO  Encounter Date: 05/16/2016      PT End of Session - 05/16/16 0941    Visit Number 5   Number of Visits 9   Date for PT Re-Evaluation 06/16/16   Authorization Type MCD-8 visits approved from 2/19-5/27/18   Authorization - Visit Number 4   Authorization - Number of Visits 8   PT Start Time 7867   PT Stop Time 1016   PT Time Calculation (min) 45 min   Activity Tolerance Treatment limited secondary to medical complications (Comment);Patient tolerated treatment well   Behavior During Therapy [facility] for tasks assessed/performed      Past Medical History:  Diagnosis Date  . Diabetes ([place])   . Diabetes mellitus without complication ([place])   . Diastolic dysfunction   . Hypertension   . Immune deficiency disorder ([place])   . Menometrorrhagia 04/01/2016  . Stroke [facility])     Past Surgical History:  Procedure Laterality Date  . NO PAST SURGERIES      There were no vitals filed for this visit.      Subjective Assessment - 05/16/16 0939    Subjective My arm is pretty sore today.     Patient is accompained by: Family member   Pertinent History Goes by "[employer]" (husband Stephanie Lloyd)    Limitations House hold activities;Walking;Standing   Currently in Pain? Yes   Pain Score 6    Pain Location Arm   Pain Orientation Right   Pain Descriptors / Indicators Aching   Pain Type Acute pain   Pain Radiating Towards deltoid down through whole arm   Pain Onset More than a month ago   Pain Frequency Intermittent   Aggravating Factors  certain movements   Pain Relieving Factors rest, not moving it                         [facility] Adult PT Treatment/Exercise - 05/16/16 1010       Ambulation/Gait   Ambulation/Gait Yes   Ambulation/Gait Assistance 5: Supervision   Ambulation/Gait Assistance Details Continue to work on gait with emphasis on large stride length, increase cadence, and also improved arm swing   Ambulation Distance (Feet) 350 Feet   Assistive device None   Gait Pattern Step-through pattern;Decreased arm swing - right;Decreased arm swing - left;Decreased stride length;Decreased hip/knee flexion - right;Decreased hip/knee flexion - left;Lateral hip instability;Shuffle;Narrow base of support   Ambulation Surface Level;Indoor   Gait Comments Discussed with pt today that PT does not feel that pt needs a cane at this time due to progress with balance.      Neuro Re-ed    Neuro Re-ed Details  Went over corner HEP, see pt instruction for details on modifications made and reps performed.  Continue high level balance in // bars on rocker board maintaining balance in vertical bias x 2 reps of 30 secs.  Progressed to tossing ball while on rocker board in both vertical and horizontal bias x 10-15 reps each and finally adding addition of cognitive task of naming foods in alphabetical order.  Note marked decrease in balance and/or ability to dual task with cognitive task.   Educated on importance of being able to dual task, esp in the  home with tasks such as cooking and importance of having quieter environment and S at this time for these tasks.  Pt verbalized understanding.                 PT Education - 05/16/16 0941    Education provided Yes   Education Details dual tasking   Person(s) Educated Patient   Methods Explanation   Comprehension Verbalized understanding          PT Short Term Goals - 03/14/16 1233      PT SHORT TERM GOAL #1   Title Pt will perform initial HEP with mod I using paper handout to indicate safe compliance, maximize functional gains. Target date: Following 4th visit after eval)   Baseline dependent for current program   Time 4    Period Weeks   Status New     PT SHORT TERM GOAL #2   Title Pt will improve BERG balance score to >52/56 in order to indicate decreased fall risk.     Baseline 46/56 on 03/14/16   Time 4   Period Weeks   Status New     PT SHORT TERM GOAL #3   Title Pt will increase gait speed to 1.59 ft/sec in order to indicate decreased fall risk and improved efficiency of gait.     Baseline .99 ft/sec on 03/14/16   Time 4   Period Weeks   Status New     PT SHORT TERM GOAL #4   Title Will assess 6MWT and improve 150' from baseline in order to indicate improved functional endurnace.    Baseline did not have time to assess on eval   Time 4   Period Weeks   Status New     PT SHORT TERM GOAL #5   Title Pt will perform floor recovery transfer at mod I level in order to indicate safe transfer from floor and safety getting up from air mattress.     Baseline requires assist from husband and RW at this time   Time 4   Period Weeks   Status New     Additional Short Term Goals   Additional Short Term Goals Yes     PT SHORT TERM GOAL #6   Title Pt will ambulate up to 200' over indoor surfaces with SPC at S level in order to indicate increased independence with household gait.    Baseline S to mod I level with RW over indoor surfaces   Time 4   Period Weeks   Status New     PT SHORT TERM GOAL #7   Title Pt will improve 5TSS time to <20 secs without UE support in order to indicate improved functional endurance.     Baseline 29.50 secs with UE support on lap           PT Long Term Goals - 05/09/16 1233      PT LONG TERM GOAL #1   Title Pt will be independent with handout with final HEP in order to indicate improved functional mobility.  (Target Date: following 8th visit after eval or by 06/23/16)   Baseline Pt is participating in informal walking program, will need to go over formal HEP in future visits.     Time 8   Period Weeks   Status On-going     PT LONG TERM GOAL #2   Title Will  perform FGA as able and set goal as appropriate to indicate improved balance.     Baseline  16/30 on FGA on 05/02/16   Time 8   Period Weeks   Status Achieved     PT LONG TERM GOAL #3   Title Pt will improve gait speed to 2.19 ft/sec in order to indicate decreased fall risk and improved efficiency of gait.     Baseline Pt has improved gait speed to 1.45 ft/sec without AD, however this is still indicative of fall risk and decreased ability to ambulate at community level.     Time 8   Period Weeks   Status Not Met     PT LONG TERM GOAL #4   Title Pt will ambulate up to 500' over unlevel paved surfaces (including ramp/curb) w/ SPC or LRAD at S level (due to cognition) in order to improve community negotiation.     Baseline 500' over unlevel paved surfaces without AD at S level, would like to update to mod I level due to progress.    Time 8   Period Weeks   Status Achieved     PT LONG TERM GOAL #5   Title Pt will improve 6MWT by 300' from baseline in order to indicate improved functional endurance.     Baseline Pt went from 8' at baseline to 876' an increase of 197'   Time 8   Period Weeks   Status On-going     PT LONG TERM GOAL #6   Title Pt will perform 5TSS in </=15 secs without UE support in order to indicate improved functional strength.     Baseline 16.22 secs without UE support, still indicative of decreased functional strength   Time 8   Period Weeks   Status On-going     PT LONG TERM GOAL #7   Title Pt will improve FGA to >/= 19/30 on FGA in order to indicate decreased fall risk.    Time 8   Period Weeks   Status New               Plan - 05/16/16 1035    Clinical Impression Statement Skilled session focused on high level balance with addition of cognitive task for increased challenge (note marked decrease in balance with cognitive task) and continue to work on gait for improved quality.  BP was 122/82 following activity during today's session.    Rehab Potential  Good   PT Frequency 1x / week   PT Duration 6 weeks   PT Treatment/Interventions ADLs/Self Care Home Management;Electrical Stimulation;DME Instruction;Gait training;Stair training;Functional mobility training;Therapeutic activities;Therapeutic exercise;Balance training;Neuromuscular re-education;Patient/family education;Orthotic Fit/Training;Energy conservation;Vestibular;Visual/perceptual remediation/compensation   PT Next Visit Plan  balance, gait for improved speed and stride length, endurance, dual task   PT Home Exercise Plan updated HEP 05/13/16   Consulted and Agree with Plan of Care Patient      Patient will benefit from skilled therapeutic intervention in order to improve the following deficits and impairments:  Abnormal gait, Decreased activity tolerance, Decreased balance, Decreased cognition, Decreased endurance, Decreased knowledge of use of DME, Decreased mobility, Decreased strength, Impaired perceived functional ability, Impaired flexibility, Impaired UE functional use, Improper body mechanics, Impaired vision/preception, Postural dysfunction  Visit Diagnosis: Hemiplegia and hemiparesis following cerebral infarction affecting left non-dominant side (HCC)  Unsteadiness on feet  Muscle weakness (generalized)  Other abnormalities of gait and mobility     Problem List Patient Active Problem List   Diagnosis Date Noted  . Iron deficiency anemia due to chronic blood loss 04/01/2016  . Menometrorrhagia 04/01/2016  . Upper respiratory infection 03/25/2016  . History  of stroke   . History of intracerebral hemorrhage without residual deficit   . Cerebrovascular accident (CVA) with involvement of left side of body ([facility]) 02/28/2016  . Ischemic stroke ([place]) 02/28/2016  . Acute kidney injury ([place]) 10/28/2015  . Acute encephalopathy 10/28/2015  . Diastolic dysfunction   . Stroke (cerebrum) ([place])   . Focal infarction of brain ([place]) 10/08/2015  . Syncope 08/19/2015  .  Hypertensive emergency 07/29/2015  . Hypertensive urgency 07/28/2015  . Hypokalemia 07/28/2015  . Accelerated hypertension 09/21/2014  . Type 2 diabetes mellitus with other circulatory complications ([place]) 04/88/8916  . Hyperlipidemia 09/21/2014  . Thalamic hemorrhage with stroke ([place]) 07/29/2014  . Hyperlipidemia LDL goal <70 07/29/2014  . Hemorrhagic stroke ([place]) 06/22/2014  . ICH (intracerebral hemorrhage) ([place]) 06/22/2014  . Essential hypertension 05/13/2012  . Diabetes mellitus ([place]) 05/13/2012    Stephanie Lloyd, Stephanie Lloyd 05/16/2016, 11:05 AM  [facility] [address] [place], [place], [address] Phone: [phone]   Fax:  [phone]  Name: Stephanie Lloyd MRN: [id number] Date of Birth: 1970/02/26

## 2016-05-16 NOTE — Patient Instructions (Signed)
Tandem Walking    Walk with each foot directly in front of other, heel of one foot touching toes of other foot with each step. Both feet straight ahead.  When you get to the end of the counter, repeat heel to toe backwards.  Make sure you have counter top or back of couch to hold onto for safety, but use counter as lightly as possible.   Repeat x 3 reps down and back    Copyright  VHI. All rights reserved.   "I love a Parade" Lift    Do this along counter top or along back of couch.  March forward, raising knees as high as you can, go slow!  When you get to the end, go backwards, but make sure you are still taking big steps.   Repeat x 3 reps down and back.   http://gt2.exer.us/345   Copyright  VHI. All rights reserved.     For the next two:  Stand in corner with chair in front of you for safety.   Feet Together (Compliant Surface) Arm Motion - Eyes Closed    Stand on compliant surface: ____pillow____ with feet together. Close eyes and move arms up and down: to front. Repeat __3__ times per session for 30 secs each. Do __1-2__ sessions per day.  Copyright  VHI. All rights reserved.    Feet Together (Compliant Surface) Head Motion - Eyes Closed    Stand on compliant surface: __[pillow______ with feet together. Close eyes and move head slowly, up and down x 10 reps and side to side x 10 reps Repeat _1___ times per session. Do _1-2___ sessions per day.  Copyright  VHI. All rights reserved.

## 2016-05-19 NOTE — Telephone Encounter (Signed)
I'm not sure what it is either

## 2016-05-20 ENCOUNTER — Other Ambulatory Visit (HOSPITAL_COMMUNITY)
Admission: RE | Admit: 2016-05-20 | Discharge: 2016-05-20 | Disposition: A | Discharge status: Home / Self Care | Payer: Medicaid Other | Source: Ambulatory Visit | Attending: Obstetrics & Gynecology | Admitting: Obstetrics & Gynecology

## 2016-05-20 ENCOUNTER — Encounter: Payer: Self-pay | Admitting: Obstetrics & Gynecology

## 2016-05-20 ENCOUNTER — Ambulatory Visit (INDEPENDENT_AMBULATORY_CARE_PROVIDER_SITE_OTHER): Payer: Medicaid Other | Admitting: Obstetrics & Gynecology

## 2016-05-20 ENCOUNTER — Ambulatory Visit (HOSPITAL_BASED_OUTPATIENT_CLINIC_OR_DEPARTMENT_OTHER): Payer: Medicaid Other

## 2016-05-20 VITALS — BP 131/76 | HR 68 | Ht 70.0 in | Wt 186.0 lb

## 2016-05-20 DIAGNOSIS — Z124 Encounter for screening for malignant neoplasm of cervix: Secondary | ICD-10-CM

## 2016-05-20 DIAGNOSIS — Z1151 Encounter for screening for human papillomavirus (HPV): Secondary | ICD-10-CM | POA: Diagnosis not present

## 2016-05-20 DIAGNOSIS — N938 Other specified abnormal uterine and vaginal bleeding: Secondary | ICD-10-CM

## 2016-05-20 DIAGNOSIS — Z0000 Encounter for general adult medical examination without abnormal findings: Secondary | ICD-10-CM

## 2016-05-20 NOTE — Progress Notes (Signed)
   Subjective:    Patient ID: Stephanie Lloyd, female    DOB: 01/12/1971, 46 y.o.   MRN: [id number]  HPI 46 yo M AAP1 (34 yo son) here today to discuss her periods. She has been bleeding for an month.   Review of Systems Last pap more than 3 years ago, normal Last mammo more than a year ago Married for more than 27 years Husband had a vasectomy + FH- no breast/gyn/colon cancer    Objective:   Physical Exam [id number] Breathing, conversing, and ambulating normally Abd- benign Small to moderate amount of vaginal bleeding noted Bimanual exam reveals a 10 week size uterus c/w fibroids, no palpable adnexal masses       Assessment & Plan:  DUB- check TSH, cbc, gyn u/s Preventative care- check pap, mammo

## 2016-05-20 NOTE — Telephone Encounter (Signed)
Could you please contact PT at phone number below to clarify?

## 2016-05-20 NOTE — Progress Notes (Signed)
Patient reports her period has been going on for over a month. Armandina Stammer RNBSN

## 2016-05-21 ENCOUNTER — Ambulatory Visit (HOSPITAL_BASED_OUTPATIENT_CLINIC_OR_DEPARTMENT_OTHER)
Admission: RE | Admit: 2016-05-21 | Discharge: 2016-05-21 | Disposition: A | Discharge status: Home / Self Care | Payer: Medicaid Other | Source: Ambulatory Visit | Attending: Obstetrics & Gynecology | Admitting: Obstetrics & Gynecology

## 2016-05-21 ENCOUNTER — Encounter: Payer: Medicaid Other | Admitting: Dietician

## 2016-05-21 DIAGNOSIS — N938 Other specified abnormal uterine and vaginal bleeding: Secondary | ICD-10-CM

## 2016-05-21 DIAGNOSIS — N83291 Other ovarian cyst, right side: Secondary | ICD-10-CM | POA: Diagnosis not present

## 2016-05-21 DIAGNOSIS — Z713 Dietary counseling and surveillance: Secondary | ICD-10-CM | POA: Diagnosis not present

## 2016-05-21 DIAGNOSIS — E1159 Type 2 diabetes mellitus with other circulatory complications: Secondary | ICD-10-CM

## 2016-05-21 DIAGNOSIS — Z794 Long term (current) use of insulin: Principal | ICD-10-CM

## 2016-05-21 LAB — CBC
HEMATOCRIT: 35.3 % (ref 34.0–46.6)
HEMOGLOBIN: 11.4 g/dL (ref 11.1–15.9)
MCH: 27.2 pg (ref 26.6–33.0)
MCHC: 32.3 g/dL (ref 31.5–35.7)
MCV: 84 fL (ref 79–97)
Platelets: 271 10*3/uL (ref 150–379)
RBC: 4.19 x10E6/uL (ref 3.77–5.28)
RDW: 16.1 % — AB (ref 12.3–15.4)
WBC: 8 10*3/uL (ref 3.4–10.8)

## 2016-05-21 LAB — CYTOLOGY - PAP
Diagnosis: NEGATIVE
HPV: NOT DETECTED

## 2016-05-21 LAB — TSH: TSH: 1.23 u[IU]/mL (ref 0.450–4.500)

## 2016-05-21 NOTE — Progress Notes (Signed)
Patient was seen on 05/21/16 for the second and third of a series of three diabetes self-management courses at the [facility]. The following learning objectives were met by the patient during this class:   Describe the role of different macronutrients on glucose  Explain how carbohydrates affect blood glucose  State what foods contain the most carbohydrates  Demonstrate carbohydrate counting  Demonstrate how to read Nutrition Facts food label  Describe effects of various fats on heart health  Describe the importance of good nutrition for health and healthy eating strategies  Describe techniques for managing your shopping, cooking and meal planning  List strategies to follow meal plan when dining out  Describe the effects of alcohol on glucose and how to use it safel   . State the amount of activity recommended for healthy living . Describe activities suitable for individual needs . Identify ways to regularly incorporate activity into daily life . Identify barriers to activity and ways to over come these barriers  Identify diabetes medications being personally used and their primary action for lowering glucose and possible side effects . Describe role of stress on blood glucose and develop strategies to address psychosocial issues . Identify diabetes complications and ways to prevent them  Explain how to manage diabetes during illness . Evaluate success in meeting personal goal . Establish 2-3 goals that they will plan to diligently work on until they return for the  33-monthfollow-up visit  Goals:   I will be active 15 minutes or more 4 times a week  I will eat less unhealthy fats by eating less margarine and butter.  Your patient has identified these potential barriers to change:  Finances Stress Fatigue  Your patient has identified their diabetes self-care support plan as  Family Support On-line Resources Plan:  Attend Support Group  as desired

## 2016-05-23 ENCOUNTER — Ambulatory Visit: Payer: Medicaid Other

## 2016-05-23 ENCOUNTER — Encounter: Payer: Self-pay | Admitting: Rehabilitation

## 2016-05-23 ENCOUNTER — Ambulatory Visit: Payer: Medicaid Other | Admitting: Rehabilitation

## 2016-05-23 ENCOUNTER — Ambulatory Visit: Payer: Medicaid Other | Admitting: Occupational Therapy

## 2016-05-23 DIAGNOSIS — I69354 Hemiplegia and hemiparesis following cerebral infarction affecting left non-dominant side: Secondary | ICD-10-CM

## 2016-05-23 DIAGNOSIS — I69318 Other symptoms and signs involving cognitive functions following cerebral infarction: Secondary | ICD-10-CM

## 2016-05-23 DIAGNOSIS — R2681 Unsteadiness on feet: Secondary | ICD-10-CM

## 2016-05-23 DIAGNOSIS — R41841 Cognitive communication deficit: Secondary | ICD-10-CM

## 2016-05-23 DIAGNOSIS — M6281 Muscle weakness (generalized): Secondary | ICD-10-CM

## 2016-05-23 DIAGNOSIS — R471 Dysarthria and anarthria: Secondary | ICD-10-CM

## 2016-05-23 DIAGNOSIS — R1312 Dysphagia, oropharyngeal phase: Secondary | ICD-10-CM

## 2016-05-23 NOTE — Therapy (Signed)
[facility] [address] [place], [place], [address] Phone: [phone]   Fax:  [phone]  Occupational Therapy Treatment  Patient Details  Name: Stephanie Lloyd MRN: [id number] Date of Birth: 12/12/1970 Referring Provider: Dr. Lyndal Pulley  Encounter Date: 05/23/2016      OT End of Session - 05/23/16 1151    Visit Number 7   Number of Visits 9   Date for OT Re-Evaluation 05/12/16   Authorization Type MCD - pt approved for 8 visits by 06/23/2016   Authorization Time Period 03/18/16 - 05/12/16, date extension approved 05/13/16 - 06/23/16   Authorization - Visit Number 7   Authorization - Number of Visits 9   OT Start Time 1100   OT Stop Time 1140   OT Time Calculation (min) 40 min   Activity Tolerance Patient tolerated treatment well      Past Medical History:  Diagnosis Date  . Diabetes ([place])   . Diabetes mellitus without complication ([place])   . Diastolic dysfunction   . Hypertension   . Immune deficiency disorder ([place])   . Menometrorrhagia 04/01/2016  . Stroke [facility])     Past Surgical History:  Procedure Laterality Date  . NO PAST SURGERIES      There were no vitals filed for this visit.      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 No/denies                      OT Treatments/Exercises (OP) - 05/23/16 0001      ADLs   Cooking Pt given choice of what to cook and pt chose to make grilled cheese sandwich. Pt gathered all ingredients, turned on stove, adjusted stove and remembered to turn stove off. Pt however did need cues to id appropriate stove eye to use (pt chose small pan but large eye). Pt also had difficulty flipping sandwich  RUE and did not perform with full safety. Recommended placing top piece of bread and letting cheese melt before trying to flip sandwich.    ADL Comments Continued to address memory strategies. Pt reports I'ly remembering medications including new eye drops which are required 2x/day. Pt also has a strategy to look at the day's appointments/schedule that morning and reports remembering her appointments that way. Pt reports mostly problems with recent past events. Suggested keeping a journal or daily calendar with notes for this                     OT Long Term Goals - 05/23/16 1131      OT LONG TERM GOAL #1   Title Independent with coordination HEP and putty HEP    Baseline DEPENDENT   Time 8   Period Weeks   Status Achieved     OT LONG TERM GOAL #2   Title Improve coordination bilaterally as evidenced by reducing speed on 9 hole peg test by 5 sec. or more   Baseline eval: Rt = 43.25 sec, Lt = 45.57 sec   Time 8   Period Weeks   Status Achieved  R= 32.47, L = 34.60     OT LONG TERM GOAL #3  Title Grip strength Rt dominant hand to be 45 lbs or greater to open jars/containers   Baseline eval = 38 lbs (Lt = 50 lbs)    Time 8   Period Weeks   Status Achieved  R= 55, L= 65     OT LONG TERM GOAL #4   Title Pt/family to verbalize understanding with memory compensatory strategies and how to implement    Baseline dependent   Time 8   Period Weeks   Status Partially Met  currently working on identifying strategies to assist and then will help pt and husband to implement     OT LONG TERM GOAL #5   Title Pt to fully return to laundry tasks and consistently preparing snacks, cold meal, microwaveable items    Baseline dependent since recent stroke   Time 8   Period Weeks   Status Partially Met  pt is doing laundry, preparing snack and cold meal prep. Pt is not currently cooking or using microwave yet     OT LONG TERM GOAL #6   Title Cognition goal TBD prn    Baseline  (based on MOCA score next visit)    Time 8   Period Weeks   Status Achieved  Pts scored 22/30 on MOCA  Pt's MOCA score showed predominately ST memory and working memory deficits as well as language issues . ST addressing language issues.  See  goal #4 above.                 Plan - 05/23/16 1153    Clinical Impression Statement Pt making progress towards memory strategies and cooking with supervision. Pt still requires occasional cueing and direct supervision for stovetop cooking   Rehab Potential Fair   Clinical Impairments Affecting Rehab Potential premorbid status (previous CVA's x2), limitied visits with insurance   OT Frequency 1x / week   OT Duration 8 weeks   OT Treatment/Interventions Self-care/ADL training;Neuromuscular education;Therapeutic exercise;Cognitive remediation/compensation;Therapeutic activities;Patient/family education;Visual/perceptual remediation/compensation   Plan make spaghetti meal (last session: make omelette)   Consulted and Agree with Plan of Care Patient      Patient will benefit from skilled therapeutic intervention in order to improve the following deficits and impairments:  Decreased coordination, Decreased endurance, Decreased safety awareness, Decreased activity tolerance, Decreased knowledge of precautions, Impaired tone, Decreased balance, Decreased knowledge of use of DME, Impaired UE functional use, Decreased cognition, Decreased mobility, Decreased strength, Impaired perceived functional ability, Impaired vision/preception  Visit Diagnosis: Hemiplegia and hemiparesis following cerebral infarction affecting left non-dominant side (HCC)  Other symptoms and signs involving cognitive functions following cerebral infarction    Problem List Patient Active Problem List   Diagnosis Date Noted  . Iron deficiency anemia due to chronic blood loss 04/01/2016  . Menometrorrhagia 04/01/2016  . Upper respiratory infection 03/25/2016  . History of  stroke   . History of intracerebral hemorrhage without residual deficit   . Cerebrovascular accident (CVA) with involvement of left side of body ([place]) 02/28/2016  . Ischemic stroke ([place]) 02/28/2016  . Acute kidney injury ([place]) 10/28/2015  . Acute encephalopathy 10/28/2015  . Diastolic dysfunction   . Stroke (cerebrum) ([place])   . Focal infarction of brain ([place]) 10/08/2015  . Syncope 08/19/2015  . Hypertensive emergency 07/29/2015  . Hypertensive urgency 07/28/2015  . Hypokalemia 07/28/2015  . Accelerated hypertension 09/21/2014  . Type 2 diabetes mellitus with other circulatory complications ([place]) 84/13/2440  . Hyperlipidemia 09/21/2014  . 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 Hyperlipidemia LDL goal <  70 07/29/2014  . Hemorrhagic stroke ([place]) 06/22/2014  . ICH (intracerebral hemorrhage) ([place]) 06/22/2014  . Essential hypertension 05/13/2012  . Diabetes mellitus ([place]) 05/13/2012    Carey Bullocks, OTR/L 05/23/2016, 11:55 AM  [facility] [address] [place], [place], [address] Phone: [phone]   Fax:  [phone]  Name: Stephanie Lloyd MRN: [id number] Date of Birth: [birth year]

## 2016-05-23 NOTE — Telephone Encounter (Signed)
Form completed and mailed to [employer].  Copy sent for scanning.

## 2016-05-23 NOTE — Patient Instructions (Signed)
  Please complete the assigned speech therapy homework prior to your next session and return it to the speech therapist at your next visit.  

## 2016-05-23 NOTE — Therapy (Signed)
[facility] [address] [place], [place], [address] Phone: [phone]   Fax:  [phone]  Physical Therapy Treatment  Patient Details  Name: Stephanie Lloyd MRN: [id number] Date of Birth: 07-15-1970 Referring Provider: Ann Held, DO  Encounter Date: 05/23/2016      PT End of Session - 05/23/16 1022    Visit Number 6   Number of Visits 9   Date for PT Re-Evaluation 06/16/16   Authorization Type MCD-8 visits approved from 2/19-5/27/18   Authorization - Visit Number 5   Authorization - Number of Visits 8   PT Start Time 5465   PT Stop Time 1100   PT Time Calculation (min) 42 min   Activity Tolerance Treatment limited secondary to medical complications (Comment);Patient tolerated treatment well   Behavior During Therapy [facility] for tasks assessed/performed      Past Medical History:  Diagnosis Date  . Diabetes ([place])   . Diabetes mellitus without complication ([place])   . Diastolic dysfunction   . Hypertension   . Immune deficiency disorder ([place])   . Menometrorrhagia 04/01/2016  . Stroke [facility])     Past Surgical History:  Procedure Laterality Date  . NO PAST SURGERIES      There were no vitals filed for this visit.      Subjective Assessment - 05/23/16 1021    Subjective "Exercises are still going well, but I'm still wobbly."    Pertinent History Goes by "[name]" (husband Louie Casa)    Limitations House hold activities;Walking;Standing   Currently in Pain? No/denies                         [facility] Adult PT Treatment/Exercise - 05/23/16 1037      Neuro Re-ed    Neuro Re-ed Details  quadruped alternating LEs x 10 reps with tactile cues to prevent lateral movement, progressing to alternating UE/LE x 10 reps again with increased assist to prevent lateral movement.  Also note increaed difficulty maintaining stance on RLE.  Therefore worked on transitions from tall kneeling to R half  kneeling.   Performed x 10 reps. Needs max cues and demonstration due to apraxia in order to complete task accurately.   Continue to work on R hip proximal control with standing L hip abduction with tactile and verbal cues for improved control stance.       Exercises   Exercises Other Exercises   Other Exercises  Also worked on core strength during session and improving posterior pelvic tilt with hooklying posterior pelvic tilt x 10 reps.  Pt with decreased strength noted, therefore provided this for HEP as well as BLE bridging with posterior pelvic tilt prior to lift to further engage core muscles.  See pt instruction for details on exercises.  Also added cat/camel stretch for improved core/anterior abdominal muscle activation to HEP.  Performed x 5 reps                PT Education - 05/23/16 1022    Education provided Yes   Education Details additional exercises to HEP   Person(s) Educated Patient   Methods Explanation;Demonstration;Handout   Comprehension Verbalized understanding;Returned demonstration          PT Short Term Goals - 03/14/16 1233      PT SHORT TERM GOAL #1   Title Pt will perform initial HEP with mod I using paper handout to indicate safe compliance, maximize functional gains. Target date: Following  4th visit after eval)   Baseline dependent for current program   Time 4   Period Weeks   Status New     PT SHORT TERM GOAL #2   Title Pt will improve BERG balance score to >52/56 in order to indicate decreased fall risk.     Baseline 46/56 on 03/14/16   Time 4   Period Weeks   Status New     PT SHORT TERM GOAL #3   Title Pt will increase gait speed to 1.59 ft/sec in order to indicate decreased fall risk and improved efficiency of gait.     Baseline .99 ft/sec on 03/14/16   Time 4   Period Weeks   Status New     PT SHORT TERM GOAL #4   Title Will assess 6MWT and improve 150' from baseline in order to indicate improved functional endurnace.    Baseline  did not have time to assess on eval   Time 4   Period Weeks   Status New     PT SHORT TERM GOAL #5   Title Pt will perform floor recovery transfer at mod I level in order to indicate safe transfer from floor and safety getting up from air mattress.     Baseline requires assist from husband and RW at this time   Time 4   Period Weeks   Status New     Additional Short Term Goals   Additional Short Term Goals Yes     PT SHORT TERM GOAL #6   Title Pt will ambulate up to 200' over indoor surfaces with SPC at S level in order to indicate increased independence with household gait.    Baseline S to mod I level with RW over indoor surfaces   Time 4   Period Weeks   Status New     PT SHORT TERM GOAL #7   Title Pt will improve 5TSS time to <20 secs without UE support in order to indicate improved functional endurance.     Baseline 29.50 secs with UE support on lap           PT Long Term Goals - 05/09/16 1233      PT LONG TERM GOAL #1   Title Pt will be independent with handout with final HEP in order to indicate improved functional mobility.  (Target Date: following 8th visit after eval or by 06/23/16)   Baseline Pt is participating in informal walking program, will need to go over formal HEP in future visits.     Time 8   Period Weeks   Status On-going     PT LONG TERM GOAL #2   Title Will perform FGA as able and set goal as appropriate to indicate improved balance.     Baseline 16/30 on FGA on 05/02/16   Time 8   Period Weeks   Status Achieved     PT LONG TERM GOAL #3   Title Pt will improve gait speed to 2.19 ft/sec in order to indicate decreased fall risk and improved efficiency of gait.     Baseline Pt has improved gait speed to 1.45 ft/sec without AD, however this is still indicative of fall risk and decreased ability to ambulate at community level.     Time 8   Period Weeks   Status Not Met     PT LONG TERM GOAL #4   Title Pt will ambulate up to 500' over unlevel  paved surfaces (including ramp/curb) w/ SPC  or LRAD at S level (due to cognition) in order to improve community negotiation.     Baseline 500' over unlevel paved surfaces without AD at S level, would like to update to mod I level due to progress.    Time 8   Period Weeks   Status Achieved     PT LONG TERM GOAL #5   Title Pt will improve 6MWT by 300' from baseline in order to indicate improved functional endurance.     Baseline Pt went from 81' at baseline to 876' an increase of 197'   Time 8   Period Weeks   Status On-going     PT LONG TERM GOAL #6   Title Pt will perform 5TSS in </=15 secs without UE support in order to indicate improved functional strength.     Baseline 16.22 secs without UE support, still indicative of decreased functional strength   Time 8   Period Weeks   Status On-going     PT LONG TERM GOAL #7   Title Pt will improve FGA to >/= 19/30 on FGA in order to indicate decreased fall risk.    Time 8   Period Weeks   Status New               Plan - 05/23/16 1023    Clinical Impression Statement Skilled session focused on proximal R hip control during closed chain activity as well as core stability as she tends to ambulate with lack of core activation and increased anterior pelvic tilt.     Rehab Potential Good   PT Frequency 1x / week   PT Duration 6 weeks   PT Treatment/Interventions ADLs/Self Care Home Management;Electrical Stimulation;DME Instruction;Gait training;Stair training;Functional mobility training;Therapeutic activities;Therapeutic exercise;Balance training;Neuromuscular re-education;Patient/family education;Orthotic Fit/Training;Energy conservation;Vestibular;Visual/perceptual remediation/compensation   PT Next Visit Plan  balance, gait for improved speed and stride length, endurance, dual task, core activation during gait   PT Home Exercise Plan updated HEP 05/13/16   Consulted and Agree with Plan of Care Patient      Patient will benefit  from skilled therapeutic intervention in order to improve the following deficits and impairments:  Abnormal gait, Decreased activity tolerance, Decreased balance, Decreased cognition, Decreased endurance, Decreased knowledge of use of DME, Decreased mobility, Decreased strength, Impaired perceived functional ability, Impaired flexibility, Impaired UE functional use, Improper body mechanics, Impaired vision/preception, Postural dysfunction  Visit Diagnosis: Hemiplegia and hemiparesis following cerebral infarction affecting left non-dominant side (HCC)  Unsteadiness on feet  Muscle weakness (generalized)     Problem List Patient Active Problem List   Diagnosis Date Noted  . Iron deficiency anemia due to chronic blood loss 04/01/2016  . Menometrorrhagia 04/01/2016  . Upper respiratory infection 03/25/2016  . History of stroke   . History of intracerebral hemorrhage without residual deficit   . Cerebrovascular accident (CVA) with involvement of left side of body ([place]) 02/28/2016  . Ischemic stroke ([place]) 02/28/2016  . Acute kidney injury ([place]) 10/28/2015  . Acute encephalopathy 10/28/2015  . Diastolic dysfunction   . Stroke (cerebrum) ([place])   . Focal infarction of brain ([place]) 10/08/2015  . Syncope 08/19/2015  . Hypertensive emergency 07/29/2015  . Hypertensive urgency 07/28/2015  . Hypokalemia 07/28/2015  . Accelerated hypertension 09/21/2014  . Type 2 diabetes mellitus with other circulatory complications ([place]) 61/95/0932  . Hyperlipidemia 09/21/2014  . Thalamic hemorrhage with stroke ([place]) 07/29/2014  . Hyperlipidemia LDL goal <70 07/29/2014  . Hemorrhagic stroke ([place]) 06/22/2014  . ICH (intracerebral hemorrhage) ([place]) 06/22/2014  .  Essential hypertension 05/13/2012  . Diabetes mellitus ([place]) 05/13/2012    Cameron Sprang, PT, MPT [facility] [address] [employer] [place], [place], [address] Phone: [phone]   Fax:  [phone] 05/23/16,  12:05 PM  Name: Stephanie Lloyd MRN: [id number] Date of Birth: 10/14/1970

## 2016-05-23 NOTE — Therapy (Signed)
[facility] [address] [place], [place], [address] Phone: [phone]   Fax:  [phone]  Speech Language Pathology Treatment  Patient Details  Name: Stephanie Lloyd MRN: [id number] Date of Birth: Aug 30, 1970 Referring Provider: Roma Schanz, MD  Encounter Date: 05/23/2016      End of Session - 05/23/16 1550    Visit Number 8   Number of Visits 9   Date for SLP Re-Evaluation 06/14/16   Authorization Type MEDICAID   Authorization Time Period expires 06-16-16   Authorization - Visit Number 8   Authorization - Number of Visits 9   SLP Start Time 0932   SLP Stop Time  9509   SLP Time Calculation (min) 43 min   Activity Tolerance Patient tolerated treatment well      Past Medical History:  Diagnosis Date  . Diabetes ([place])   . Diabetes mellitus without complication ([place])   . Diastolic dysfunction   . Hypertension   . Immune deficiency disorder ([place])   . Menometrorrhagia 04/01/2016  . Stroke [facility])     Past Surgical History:  Procedure Laterality Date  . NO PAST SURGERIES      There were no vitals filed for this visit.      Subjective Assessment - 05/23/16 0947    Subjective Pt cont with mildly dysarthric speech today.   Currently in Pain? No/denies               ADULT SLP TREATMENT - 05/23/16 0959      General Information   Behavior/Cognition Alert;Cooperative;Pleasant mood     Cognitive-Linquistic Treatment   Treatment focused on Dysarthria   Skilled Treatment SLP and pt discussed goals she made Tuesday with dietician. SLP encouraged/reminded pt to use overarticulation and pt did so for approx 30 seconds after cues from SLP. SLP targeted pt's intelligibility compensations today in sentence tasks - pt req'd usual min-mod A to improve intelligibility. No carryover was noted when SLP assessed presence or absence of speech compensations in conversation/non-therapeutic tasks.     Assessment  / Recommendations / Plan   Plan Continue with current plan of care     Progression Toward Goals   Progression toward goals Progressing toward goals            SLP Short Term Goals - 05/09/16 1048      SLP SHORT TERM GOAL #1   Title pt will complete dysarthria HEP with occasional min A over two sessions   Baseline 03/29/16   Status Achieved  pt has completed HEP correctly in one/two sessions     SLP SHORT TERM GOAL #2   Title pt will complete dysphagia HEP with occasional min A over two sessions   Status Partially Met  pt has completed correctly one/two sessions     SLP SHORT TERM GOAL #3   Title pt will report less frequent overt s/s aspiration at home   Status Achieved     SLP SHORT TERM GOAL #4   Title pt will demo dysarthria compensations in 15/20 sentence responses   Time 1   Status Achieved  GOAL MET 05-02-16          SLP Long Term Goals - 05/23/16 1552      SLP LONG TERM GOAL #1   Title pt will complete HEP for dysarthria with rare min A over two sessions   Status Achieved  completed in 1/2 sessions     SLP LONG TERM GOAL #2  Title pt will complete HEP for dysphagia with rare min A over two sessions   Time 3   Period Weeks   Status On-going  met in 1/2 sessions     SLP LONG TERM GOAL #3   Title pt will follow swallow precautions with POs with modified independence over two sessions   Status Achieved  met 05-02-16     SLP LONG TERM GOAL #4   Title pt will demo speech intelligibliity compensations with occasional min A in 5 minutes simple conversation   Time 4   Period Weeks   Status Revised  accomplshed in strucutred speech tasks.          Plan - 05/23/16 1550    Clinical Impression Statement Pt did not bring 3-ring binder to therapy today, unknown if she has bought yet or not. SLP again reiterated to pt the need to complete all HEPs as directed. Pt's speech compensations req'd cues from SLP to use in structured speech tasks, and no carryover to  unstructured/non-therapeutic tasks. Cont'd skilled ST for one more session to target speech intelligibilty and swallowing, and cognition/memory.   Speech Therapy Frequency 1x /week   Duration --  8 weeks   Treatment/Interventions Aspiration precaution training;Pharyngeal strengthening exercises;Diet toleration management by SLP;Oral motor exercises;Compensatory strategies;Functional tasks;Cueing hierarchy;Patient/family education;Multimodal communcation approach;Internal/external aids;SLP instruction and feedback;Compensatory techniques   Potential to Achieve Goals Good   Potential Considerations Previous level of function;Severity of impairments;Financial resources   Consulted and Agree with Plan of Care Patient      Patient will benefit from skilled therapeutic intervention in order to improve the following deficits and impairments:   Dysarthria and anarthria  Dysphagia, oropharyngeal phase  Cognitive communication deficit    Problem List Patient Active Problem List   Diagnosis Date Noted  . Iron deficiency anemia due to chronic blood loss 04/01/2016  . Menometrorrhagia 04/01/2016  . Upper respiratory infection 03/25/2016  . History of stroke   . History of intracerebral hemorrhage without residual deficit   . Cerebrovascular accident (CVA) with involvement of left side of body ([place]) 02/28/2016  . Ischemic stroke ([place]) 02/28/2016  . Acute kidney injury ([place]) 10/28/2015  . Acute encephalopathy 10/28/2015  . Diastolic dysfunction   . Stroke (cerebrum) ([place])   . Focal infarction of brain ([place]) 10/08/2015  . Syncope 08/19/2015  . Hypertensive emergency 07/29/2015  . Hypertensive urgency 07/28/2015  . Hypokalemia 07/28/2015  . Accelerated hypertension 09/21/2014  . Type 2 diabetes mellitus with other circulatory complications ([place]) 19/14/7829  . Hyperlipidemia 09/21/2014  . Thalamic hemorrhage with stroke ([place]) 07/29/2014  . Hyperlipidemia LDL goal <70 07/29/2014  .  Hemorrhagic stroke ([place]) 06/22/2014  . ICH (intracerebral hemorrhage) ([place]) 06/22/2014  . Essential hypertension 05/13/2012  . Diabetes mellitus ([place]) 05/13/2012    [facility] ,[place], [place]  05/23/2016, 3:53 PM  [facility] [address] [place], [place], [address] Phone: [phone]   Fax:  [phone]   Name: ROSLIND MICHAUX MRN: [id number] Date of Birth: 1970/03/24

## 2016-05-23 NOTE — Patient Instructions (Addendum)
Pelvic Tilt: Posterior - Legs Bent (Supine)    Tighten stomach and flatten back by rolling pelvis down. Hold _5___ seconds. Relax. Repeat _10___ times per set. Do __1__ sets per session. Do __2__ sessions per day.  http://orth.exer.us/203   Copyright  VHI. All rights reserved.   Bracing With Bridging (Hook-Lying)    With neutral spine, tighten pelvic floor and abdominals and hold. Lift bottom. Repeat _10__ times. Do _1-2__ times a day.   Copyright  VHI. All rights reserved.       CAT [employer] CAMEL  While on your hands and knees in a crawl position, raise up your back and arch it towards the ceiling.  Next return to a lowered position and arch your back the opposite direction.  Do this 5 times each and hold each position x 5 seconds.  Make sure your hips are over your knees and shoulders over hands.

## 2016-05-24 NOTE — Telephone Encounter (Signed)
Ok to approve 

## 2016-05-24 NOTE — Telephone Encounter (Signed)
Spoke with PT and confirmed that "SPC" stands for single point cane.  Please advise regarding below message.

## 2016-05-24 NOTE — Telephone Encounter (Signed)
Order placed in EPIC.

## 2016-05-27 ENCOUNTER — Encounter: Payer: Self-pay | Admitting: Obstetrics & Gynecology

## 2016-05-27 ENCOUNTER — Other Ambulatory Visit (HOSPITAL_COMMUNITY)
Admission: RE | Admit: 2016-05-27 | Discharge: 2016-05-27 | Disposition: A | Discharge status: Home / Self Care | Payer: Medicaid Other | Source: Ambulatory Visit | Attending: Obstetrics & Gynecology | Admitting: Obstetrics & Gynecology

## 2016-05-27 ENCOUNTER — Ambulatory Visit (INDEPENDENT_AMBULATORY_CARE_PROVIDER_SITE_OTHER): Payer: Medicaid Other | Admitting: Obstetrics & Gynecology

## 2016-05-27 VITALS — BP 142/77 | HR 63 | Ht 70.0 in | Wt 186.0 lb

## 2016-05-27 DIAGNOSIS — N841 Polyp of cervix uteri: Secondary | ICD-10-CM | POA: Diagnosis not present

## 2016-05-27 DIAGNOSIS — N938 Other specified abnormal uterine and vaginal bleeding: Secondary | ICD-10-CM | POA: Diagnosis not present

## 2016-05-27 NOTE — Progress Notes (Signed)
   Subjective:    Patient ID: Stephanie Lloyd, female    DOB: 18-Dec-1970, 46 y.o.   MRN: [id number]  HPI  46 yo AA MP1 here for follow up after u/s and labs for DUB. Her u/s showed a fibroid, 9 mm lining Hbg fine  Review of Systems     Objective:   Physical Exam  [id number]  UPT negative, consent signed, time out done Cervix prepped with betadine and grasped with a single tooth tenaculum Uterus sounded to 9 cm Pipelle used for 2 passes with a moderate amount of tissue obtained. She tolerated the procedure well.     Assessment & Plan:  DUB- Await pathology results RTC Friday for results/plan

## 2016-05-28 ENCOUNTER — Ambulatory Visit: Payer: Self-pay

## 2016-05-30 ENCOUNTER — Encounter: Payer: Self-pay | Admitting: Physical Therapy

## 2016-05-30 ENCOUNTER — Ambulatory Visit: Payer: Medicaid Other | Admitting: Occupational Therapy

## 2016-05-30 ENCOUNTER — Ambulatory Visit: Payer: Medicaid Other | Admitting: Physical Therapy

## 2016-05-30 ENCOUNTER — Ambulatory Visit: Payer: Medicaid Other | Attending: Family Medicine

## 2016-05-30 DIAGNOSIS — R2689 Other abnormalities of gait and mobility: Secondary | ICD-10-CM

## 2016-05-30 DIAGNOSIS — R41841 Cognitive communication deficit: Secondary | ICD-10-CM | POA: Diagnosis present

## 2016-05-30 DIAGNOSIS — R2681 Unsteadiness on feet: Secondary | ICD-10-CM | POA: Insufficient documentation

## 2016-05-30 DIAGNOSIS — I69351 Hemiplegia and hemiparesis following cerebral infarction affecting right dominant side: Secondary | ICD-10-CM | POA: Insufficient documentation

## 2016-05-30 DIAGNOSIS — I69318 Other symptoms and signs involving cognitive functions following cerebral infarction: Secondary | ICD-10-CM | POA: Diagnosis present

## 2016-05-30 DIAGNOSIS — I69354 Hemiplegia and hemiparesis following cerebral infarction affecting left non-dominant side: Secondary | ICD-10-CM

## 2016-05-30 DIAGNOSIS — R471 Dysarthria and anarthria: Secondary | ICD-10-CM | POA: Diagnosis present

## 2016-05-30 DIAGNOSIS — R1312 Dysphagia, oropharyngeal phase: Secondary | ICD-10-CM | POA: Diagnosis present

## 2016-05-30 DIAGNOSIS — M6281 Muscle weakness (generalized): Secondary | ICD-10-CM

## 2016-05-30 NOTE — Therapy (Signed)
[facility] [address] [place], [place], [address] Phone: [phone]   Fax:  [phone]  Speech Language Pathology Treatment  Patient Details  Name: Stephanie Lloyd MRN: [id number] Date of Birth: 1970-08-17 Referring Provider: Roma Schanz, MD  Encounter Date: 05/30/2016      End of Session - 05/30/16 0939    Visit Number 9   Number of Visits 9   Date for SLP Re-Evaluation 06/14/16   Authorization Type MEDICAID   Authorization Time Period expires 06-16-16   Authorization - Visit Number 9   Authorization - Number of Visits 9   SLP Start Time [phone]  pt in restroom at 0933   Activity Tolerance Patient tolerated treatment well      Past Medical History:  Diagnosis Date  . Diabetes ([place])   . Diabetes mellitus without complication ([place])   . Diastolic dysfunction   . Hypertension   . Immune deficiency disorder ([place])   . Menometrorrhagia 04/01/2016  . Stroke [facility])     Past Surgical History:  Procedure Laterality Date  . NO PAST SURGERIES      There were no vitals filed for this visit.      Subjective Assessment - 05/30/16 0942    Subjective Pt cont with mildly dysarthric speech today. She is aware that today is last Medicaid authorized visit and does not want to switch to self-pay status.   Currently in Pain? Yes   Pain Score 5    Pain Location Arm   Pain Orientation Right   Pain Descriptors / Indicators Aching   Pain Type Acute pain   Pain Onset More than a month ago   Pain Frequency Intermittent   Aggravating Factors  movement   Pain Relieving Factors keeping it still               ADULT SLP TREATMENT - 05/30/16 0943      General Information   Behavior/Cognition Alert;Cooperative;Pleasant mood     Dysphagia Treatment   Other treatment/comments Pt indicated she is comfortable with swallowing exercises at home, does not need review at this time. Reports her swallowing has been "much  better" in that she coughs a few times a week with meals (compared to x1-2/day previously).     Cognitive-Linquistic Treatment   Treatment focused on Dysarthria   Skilled Treatment Usual min-mod A was necessary today to have pt use speech compensations today in sentence-length responses. Pt indicated she was comfortalbe completing the home exercises for dysarthria as well, and did not need to go over them today.     Assessment / Recommendations / Plan   Plan Discharge SLP treatment due to (comment)  insurance reasons (at end of Medicaid visits)     Progression Toward Goals   Progression toward goals --  see goal update - d/c day          SLP Education - 05/30/16 1545    Education provided Yes   Education Details see "pt instructions" for details   Person(s) Educated Patient   Methods Explanation;Handout   Comprehension Verbalized understanding          SLP Short Term Goals - 05/09/16 1048      SLP SHORT TERM GOAL #1   Title pt will complete dysarthria HEP with occasional min A over two sessions   Baseline 03/29/16   Status Achieved  pt has completed HEP correctly in one/two sessions     SLP [place] #2  Title pt will complete dysphagia HEP with occasional min A over two sessions   Status Partially Met  pt has completed correctly one/two sessions     SLP SHORT TERM GOAL #3   Title pt will report less frequent overt s/s aspiration at home   Status Achieved     SLP SHORT TERM GOAL #4   Title pt will demo dysarthria compensations in 15/20 sentence responses   Time 1   Status Achieved  GOAL MET 05-02-16          SLP Long Term Goals - 05/30/16 1012      SLP LONG TERM GOAL #1   Title pt will complete HEP for dysarthria with rare min A over two sessions   Status Achieved  completed in 1/2 sessions     SLP LONG TERM GOAL #2   Title pt will complete HEP for dysphagia with rare min A over two sessions   Time --   Period --   Status Partially Met  met in  1/2 sessions     SLP LONG TERM GOAL #3   Title pt will follow swallow precautions with POs with modified independence over two sessions   Status Achieved  met 05-02-16     SLP LONG TERM GOAL #4   Title pt will demo speech intelligibliity compensations with occasional min A in 5 minutes simple conversation   Time --   Period --   Status Not Met  accomplshed in strucutred speech tasks.          Plan - 05/30/16 1546    Clinical Impression Statement Pt has not obtained 3-ring binder, and tellsSLP she does not think she needs to review dysphagia or dysarthria HEPs. As toda ywas last visit she wanted to focus on "thinking and talking". SLP reiterated to pt the need to complete all HEPs as directed. Discharge ST at this time due to pt not desiring self-pay status.    Treatment/Interventions Aspiration precaution training;Pharyngeal strengthening exercises;Diet toleration management by SLP;Oral motor exercises;Compensatory strategies;Functional tasks;Cueing hierarchy;Patient/family education;Multimodal communcation approach;Internal/external aids;SLP instruction and feedback;Compensatory techniques   Potential to Achieve Goals Good   Potential Considerations Previous level of function;Severity of impairments;Financial resources   Consulted and Agree with Plan of Care Patient      Patient will benefit from skilled therapeutic intervention in order to improve the following deficits and impairments:   Dysarthria and anarthria  Dysphagia, oropharyngeal phase  Cognitive communication deficit   SPEECH THERAPY DISCHARGE SUMMARY  Visits from Start of Care: 9  Current functional level related to goals / functional outcomes: Pt cont to have mildly dysarthric speech which does not negatively affect overall speech intelligibility. She also cont to exhibit mild dysphagia, however states that her frequency of cough has decr'd to a few times per week as opposed to 1-2 times per day in previous  weeks. A degree of deficit in cognitive-linguistics also exists but was not targeted in this course due to pt's focus on dysarthria and dysphagia. If she should return to work a neuropsych eval may be warranted to ID strengths and weaknesses following CVA. Memory compensations were discussed with her and with husband but pt did not reportedly move forward with SLP recommendation (3-ring binder for organization/reminders).   Remaining deficits: Dysarthria, cognitive-linguistics, and dysphagia.   Education / Equipment: HEP for dysarthria, HEP for dysphagia, memory compensations.  Plan: Patient agrees to discharge.  Patient goals were not met. Patient is being discharged due to                                                     ?????  pt does not want to move to selfpay status.       Problem List Patient Active Problem List   Diagnosis Date Noted  . Iron deficiency anemia due to chronic blood loss 04/01/2016  . Menometrorrhagia 04/01/2016  . Upper respiratory infection 03/25/2016  . History of stroke   . History of intracerebral hemorrhage without residual deficit   . Cerebrovascular accident (CVA) with involvement of left side of body ([place]) 02/28/2016  . Ischemic stroke ([place]) 02/28/2016  . Acute kidney injury ([place]) 10/28/2015  . Acute encephalopathy 10/28/2015  . Diastolic dysfunction   . Stroke (cerebrum) ([place])   . Focal infarction of brain ([place]) 10/08/2015  . Syncope 08/19/2015  . Hypertensive emergency 07/29/2015  . Hypertensive urgency 07/28/2015  . Hypokalemia 07/28/2015  . Accelerated hypertension 09/21/2014  . Type 2 diabetes mellitus with other circulatory complications ([place]) 27/67/0110  . Hyperlipidemia 09/21/2014  . Thalamic hemorrhage with stroke ([place]) 07/29/2014  . Hyperlipidemia LDL goal <70 07/29/2014  . Hemorrhagic stroke ([place]) 06/22/2014  . ICH (intracerebral hemorrhage) ([place]) 06/22/2014  . Essential hypertension 05/13/2012  . Diabetes mellitus ([place]) 05/13/2012     [facility] ,[place], [place]  05/30/2016, 3:51 PM  [facility] [address] [place], [place], [address] Phone: [phone]   Fax:  [phone]   Name: DELMA DRONE MRN: [id number] Date of Birth: July 11, 1970

## 2016-05-30 NOTE — Patient Instructions (Addendum)
Keep working on making your speech more clear - overpronounce the words  Doing your exercises regularly may also make your speech clearer, over time  Keep working with your thinking - do some thinking games  If you start to cough MORE, tell your doctor! (Dr. Laury AxonLowne)  Remember the signs/symptoms of aspiration pneumonia  You may want to still buy a 1" 3-ring binder to help organize your papers/handouts

## 2016-05-30 NOTE — Therapy (Signed)
[facility] [address] [place], [place], [address] Phone: [phone]   Fax:  [phone]  Occupational Therapy Treatment  Patient Details  Name: Stephanie Lloyd MRN: [id number] Date of Birth: 01-16-1971 Referring Provider: Dr. Lyndal Pulley  Encounter Date: 05/30/2016      OT End of Session - 05/30/16 1152    Visit Number 8   Number of Visits 9   Authorization Type MCD - pt approved for 8 visits by 06/23/2016   Authorization Time Period 03/18/16 - 05/12/16, date extension approved 05/13/16 - 06/23/16   Authorization - Visit Number 8   Authorization - Number of Visits 9   OT Start Time 1100   OT Stop Time 1145   OT Time Calculation (min) 45 min   Activity Tolerance Patient tolerated treatment well      Past Medical History:  Diagnosis Date  . Diabetes ([place])   . Diabetes mellitus without complication ([place])   . Diastolic dysfunction   . Hypertension   . Immune deficiency disorder ([place])   . Menometrorrhagia 04/01/2016  . Stroke [facility])     Past Surgical History:  Procedure Laterality Date  . NO PAST SURGERIES      There were no vitals filed for this visit.      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 Yes   Pain Score 5    Pain Location Arm   Pain Orientation Right   Pain Descriptors / Indicators Aching   Pain Type Acute pain   Pain Frequency Intermittent   Aggravating Factors  certain movements                      OT Treatments/Exercises (OP) - 05/30/16 1145      ADLs   Cooking Pt asked to make spaghetti (noodles and sauce): pt required mod cues t/o task to adjust stove and plan ahead for  next steps (ex: getting out colander to drain noodles while noodles cooking). Pt also required questioning cues/prompts to perform steps in most logical order (ex: getting water boiling first before heating sauce). Pt remembered to turn off stove I'ly and used correct stove eyes. Pt retrieved items out of cabinets using RUE w/ cues, but instructed to drain water w/ Lt hand for safety secondary to decr. control RUE.      Neurological Re-education Exercises   Other Exercises 1 Pt reported shoulder pain RUE. Therapist assessed and issued exercise to perform at home to help retrain RUE in proper positioning. Pt return demo of ex - supine holding ball for bilateral sh. flexion. Therapist explained proper positioning and to avoid abd and IR with reaching activity                OT Education - 05/30/16 1151    Education provided Yes   Education Details safety with cooking and recommending supervision, proper positioning of RUE w/ reaching and issued exercise   Person(s) Educated Patient   Methods Explanation;Demonstration   Comprehension Verbalized understanding;Returned demonstration             OT Long Term Goals - 05/23/16 1131  OT LONG TERM GOAL #1   Title Independent with coordination HEP and putty HEP    Baseline DEPENDENT   Time 8   Period Weeks   Status Achieved     OT LONG TERM GOAL #2   Title Improve coordination bilaterally as evidenced by reducing speed on 9 hole peg test by 5 sec. or more   Baseline eval: Rt = 43.25 sec, Lt = 45.57 sec   Time 8   Period Weeks   Status Achieved  R= 32.47, L = 34.60     OT LONG TERM GOAL #3   Title Grip strength Rt dominant hand to be 45 lbs or greater to open jars/containers   Baseline eval = 38 lbs (Lt = 50 lbs)    Time 8   Period Weeks   Status Achieved  R= 55, L= 65     OT LONG TERM GOAL #4   Title Pt/family to verbalize understanding with memory compensatory strategies and how to implement    Baseline dependent    Time 8   Period Weeks   Status Partially Met  currently working on identifying strategies to assist and then will help pt and husband to implement     OT LONG TERM GOAL #5   Title Pt to fully return to laundry tasks and consistently preparing snacks, cold meal, microwaveable items    Baseline dependent since recent stroke   Time 8   Period Weeks   Status Partially Met  pt is doing laundry, preparing snack and cold meal prep. Pt is not currently cooking or using microwave yet     OT LONG TERM GOAL #6   Title Cognition goal TBD prn    Baseline (based on MOCA score next visit)    Time 8   Period Weeks   Status Achieved  Pts scored 22/30 on MOCA  Pt's MOCA score showed predominately ST memory and working memory deficits as well as language issues . ST addressing language issues.  See  goal #4 above.                 Plan - 05/30/16 1152    Clinical Impression Statement Pt making progress towards remaining goals, but still requires cueing and supervision for safety with stovetop cooking   Rehab Potential Fair   Clinical Impairments Affecting Rehab Potential premorbid status (previous CVA's x2), limitied visits with insurance   OT Frequency 1x / week   OT Duration 8 weeks   OT Treatment/Interventions Self-care/ADL training;Neuromuscular education;Therapeutic exercise;Cognitive remediation/compensation;Therapeutic activities;Patient/family education;Visual/perceptual remediation/compensation   Plan make omelette requiring chopping, review sh. ex, assess remaining goals and d/c next session   Consulted and Agree with Plan of Care Patient      Patient will benefit from skilled therapeutic intervention in order to improve the following deficits and impairments:  Decreased coordination, Decreased endurance, Decreased safety awareness, Decreased activity tolerance, Decreased knowledge of precautions, Impaired tone, Decreased balance, Decreased knowledge of use of DME, Impaired UE  functional use, Decreased cognition, Decreased mobility, Decreased strength, Impaired perceived functional ability, Impaired vision/preception  Visit Diagnosis: Hemiplegia and hemiparesis following cerebral infarction affecting left non-dominant side (HCC)  Other symptoms and signs involving cognitive functions following cerebral infarction    Problem List Patient Active Problem List   Diagnosis Date Noted  . Iron deficiency anemia due to chronic blood loss 04/01/2016  . Menometrorrhagia 04/01/2016  . Upper respiratory infection 03/25/2016  . History of stroke   . History of intracerebral hemorrhage without  residual deficit   . Cerebrovascular accident (CVA) with involvement of left side of body ([place]) 02/28/2016  . Ischemic stroke ([place]) 02/28/2016  . Acute kidney injury ([place]) 10/28/2015  . Acute encephalopathy 10/28/2015  . Diastolic dysfunction   . Stroke (cerebrum) ([place])   . Focal infarction of brain ([place]) 10/08/2015  . Syncope 08/19/2015  . Hypertensive emergency 07/29/2015  . Hypertensive urgency 07/28/2015  . Hypokalemia 07/28/2015  . Accelerated hypertension 09/21/2014  . Type 2 diabetes mellitus with other circulatory complications ([place]) 20/94/7096  . Hyperlipidemia 09/21/2014  . Thalamic hemorrhage with stroke ([place]) 07/29/2014  . Hyperlipidemia LDL goal <70 07/29/2014  . Hemorrhagic stroke ([place]) 06/22/2014  . ICH (intracerebral hemorrhage) ([place]) 06/22/2014  . Essential hypertension 05/13/2012  . Diabetes mellitus ([place]) 05/13/2012    Carey Bullocks, OTR/L 05/30/2016, 12:02 PM  [facility] [address] [place], [place], [address] Phone: [phone]   Fax:  [phone]  Name: Stephanie Lloyd MRN: [id number] Date of Birth: Apr 23, 1970

## 2016-05-31 ENCOUNTER — Ambulatory Visit (INDEPENDENT_AMBULATORY_CARE_PROVIDER_SITE_OTHER): Payer: Medicaid Other | Admitting: Obstetrics & Gynecology

## 2016-05-31 VITALS — BP 144/80 | HR 67 | Ht 70.0 in | Wt 188.0 lb

## 2016-05-31 DIAGNOSIS — N938 Other specified abnormal uterine and vaginal bleeding: Secondary | ICD-10-CM

## 2016-05-31 NOTE — Therapy (Signed)
[facility] [address] [place], [place], [address] Phone: [phone]   Fax:  [phone]  Physical Therapy Treatment  Patient Details  Name: Stephanie Lloyd MRN: [id number] Date of Birth: 01/11/1971 Referring Provider: Donato Schultz, DO  Encounter Date: 05/30/2016      PT End of Session - 05/30/16 1023    Visit Number 7   Number of Visits 9   Date for PT Re-Evaluation 06/16/16   Authorization Type MCD-8 visits approved from 2/19-5/27/18   Authorization - Visit Number 6   Authorization - Number of Visits 8   PT Start Time 1025  pt in bathroom before session, late start due to this   PT Stop Time 1100   PT Time Calculation (min) 35 min   Equipment Utilized During Treatment Gait belt   Activity Tolerance Patient tolerated treatment well   Behavior During Therapy [facility] for tasks assessed/performed      Past Medical History:  Diagnosis Date  . Diabetes (HCC)   . Diabetes mellitus without complication (HCC)   . Diastolic dysfunction   . Hypertension   . Immune deficiency disorder (HCC)   . Menometrorrhagia 04/01/2016  . Stroke [facility])     Past Surgical History:  Procedure Laterality Date  . NO PAST SURGERIES      There were no vitals filed for this visit.      Subjective Assessment - 05/30/16 1023    Subjective No new complaints. No pain or falls to report. Doing all ex's at home except the ones sent home last session due to no where for her to do them. Currenly staying with family and sleeping on air mattress in living room.    Pertinent History Goes by "Stephanie Lloyd" (husband Harvie Heck)    Limitations House hold activities;Walking;Standing   Currently in Pain? No/denies   Pain Score 0-No pain             [facility] Adult PT Treatment/Exercise - 05/30/16 1035      Transfers   Transfers Sit to Stand;Stand to Sit   Sit to Stand 6: Modified independent (Device/Increase time)   Stand to Sit 6: Modified  independent (Device/Increase time)     Ambulation/Gait   Ambulation/Gait Yes   Ambulation/Gait Assistance 5: Supervision   Ambulation/Gait Assistance Details cues on posture, core activation and incr step length with gait   Ambulation Distance (Feet) 500 Feet   Assistive device None   Gait Pattern Step-through pattern;Decreased arm swing - right;Decreased arm swing - left;Decreased stride length;Decreased hip/knee flexion - right;Decreased hip/knee flexion - left;Lateral hip instability;Shuffle;Narrow base of support   Ambulation Surface Level;Unlevel;Indoor;Outdoor;Paved   Gait Comments gait along ~50 foot hallway:  forward gait with head movements left<>right, then up<>down x 4 laps each way     Neuro Re-ed    Neuro Re-ed Details  quadruped on mat table: alternating UE lifts, alternating leg lifts with cues/mod assist for stability with ex's, expecially at pelvis which tilts laterally; gait around track while self tossing ball and naming animals a-z with min guard assist and occasional discriptive cues for animal (4 times), cues to trask of ball toss needed when pt was working on coming up with animal named (5 times separate from other times cues were given)             PT Short Term Goals - 03/14/16 1233      PT SHORT TERM GOAL #1   Title Pt will perform initial HEP with  mod I using paper handout to indicate safe compliance, maximize functional gains. Target date: Following 4th visit after eval)   Baseline dependent for current program   Time 4   Period Weeks   Status New     PT SHORT TERM GOAL #2   Title Pt will improve BERG balance score to >52/56 in order to indicate decreased fall risk.     Baseline 46/56 on 03/14/16   Time 4   Period Weeks   Status New     PT SHORT TERM GOAL #3   Title Pt will increase gait speed to 1.59 ft/sec in order to indicate decreased fall risk and improved efficiency of gait.     Baseline .99 ft/sec on 03/14/16   Time 4   Period Weeks   Status  New     PT SHORT TERM GOAL #4   Title Will assess 6MWT and improve 150' from baseline in order to indicate improved functional endurnace.    Baseline did not have time to assess on eval   Time 4   Period Weeks   Status New     PT SHORT TERM GOAL #5   Title Pt will perform floor recovery transfer at mod I level in order to indicate safe transfer from floor and safety getting up from air mattress.     Baseline requires assist from husband and RW at this time   Time 4   Period Weeks   Status New     Additional Short Term Goals   Additional Short Term Goals Yes     PT SHORT TERM GOAL #6   Title Pt will ambulate up to 200' over indoor surfaces with SPC at S level in order to indicate increased independence with household gait.    Baseline S to mod I level with RW over indoor surfaces   Time 4   Period Weeks   Status New     PT SHORT TERM GOAL #7   Title Pt will improve 5TSS time to <20 secs without UE support in order to indicate improved functional endurance.     Baseline 29.50 secs with UE support on lap           PT Long Term Goals - 05/09/16 1233      PT LONG TERM GOAL #1   Title Pt will be independent with handout with final HEP in order to indicate improved functional mobility.  (Target Date: following 8th visit after eval or by 06/23/16)   Baseline Pt is participating in informal walking program, will need to go over formal HEP in future visits.     Time 8   Period Weeks   Status On-going     PT LONG TERM GOAL #2   Title Will perform FGA as able and set goal as appropriate to indicate improved balance.     Baseline 16/30 on FGA on 05/02/16   Time 8   Period Weeks   Status Achieved     PT LONG TERM GOAL #3   Title Pt will improve gait speed to 2.19 ft/sec in order to indicate decreased fall risk and improved efficiency of gait.     Baseline Pt has improved gait speed to 1.45 ft/sec without AD, however this is still indicative of fall risk and decreased ability to  ambulate at community level.     Time 8   Period Weeks   Status Not Met     PT LONG TERM GOAL #4  Title Pt will ambulate up to 500' over unlevel paved surfaces (including ramp/curb) w/ SPC or LRAD at S level (due to cognition) in order to improve community negotiation.     Baseline 500' over unlevel paved surfaces without AD at S level, would like to update to mod I level due to progress.    Time 8   Period Weeks   Status Achieved     PT LONG TERM GOAL #5   Title Pt will improve 6MWT by 300' from baseline in order to indicate improved functional endurance.     Baseline Pt went from 84' at baseline to 876' an increase of 197'   Time 8   Period Weeks   Status On-going     PT LONG TERM GOAL #6   Title Pt will perform 5TSS in </=15 secs without UE support in order to indicate improved functional strength.     Baseline 16.22 secs without UE support, still indicative of decreased functional strength   Time 8   Period Weeks   Status On-going     PT LONG TERM GOAL #7   Title Pt will improve FGA to >/= 19/30 on FGA in order to indicate decreased fall risk.    Time 8   Period Weeks   Status New            Plan - 05/30/16 1024    Clinical Impression Statement Today's skilled session continued to focus on gait and high level balance working towards goals. Pt is making steady progress toward goals and should benefit from continued PT to progress toward unmet goals.,   Rehab Potential Good   PT Frequency 1x / week   PT Duration 6 weeks   PT Treatment/Interventions ADLs/Self Care Home Management;Electrical Stimulation;DME Instruction;Gait training;Stair training;Functional mobility training;Therapeutic activities;Therapeutic exercise;Balance training;Neuromuscular re-education;Patient/family education;Orthotic Fit/Training;Energy conservation;Vestibular;Visual/perceptual remediation/compensation   PT Next Visit Plan  balance, gait for improved speed and stride length, endurance, dual  task, core activation during gait   PT Home Exercise Plan updated HEP 05/13/16   Consulted and Agree with Plan of Care Patient      Patient will benefit from skilled therapeutic intervention in order to improve the following deficits and impairments:  Abnormal gait, Decreased activity tolerance, Decreased balance, Decreased cognition, Decreased endurance, Decreased knowledge of use of DME, Decreased mobility, Decreased strength, Impaired perceived functional ability, Impaired flexibility, Impaired UE functional use, Improper body mechanics, Impaired vision/preception, Postural dysfunction  Visit Diagnosis: Hemiplegia and hemiparesis following cerebral infarction affecting left non-dominant side (HCC)  Unsteadiness on feet  Muscle weakness (generalized)  Other abnormalities of gait and mobility     Problem List Patient Active Problem List   Diagnosis Date Noted  . Iron deficiency anemia due to chronic blood loss 04/01/2016  . Menometrorrhagia 04/01/2016  . Upper respiratory infection 03/25/2016  . History of stroke   . History of intracerebral hemorrhage without residual deficit   . Cerebrovascular accident (CVA) with involvement of left side of body ([place]) 02/28/2016  . Ischemic stroke ([place]) 02/28/2016  . Acute kidney injury ([place]) 10/28/2015  . Acute encephalopathy 10/28/2015  . Diastolic dysfunction   . Stroke (cerebrum) ([place])   . Focal infarction of brain ([place]) 10/08/2015  . Syncope 08/19/2015  . Hypertensive emergency 07/29/2015  . Hypertensive urgency 07/28/2015  . Hypokalemia 07/28/2015  . Accelerated hypertension 09/21/2014  . Type 2 diabetes mellitus with other circulatory complications ([place]) 12/87/8676  . Hyperlipidemia 09/21/2014  . 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 Hyperlipidemia  LDL goal <70 07/29/2014  . Hemorrhagic stroke ([place]) 06/22/2014  . ICH (intracerebral hemorrhage) ([place]) 06/22/2014  . Essential hypertension 05/13/2012  . Diabetes  mellitus ([place]) 05/13/2012    Willow Ora, PTA, [place] [address], [place], [place] [address] [phone] 05/31/16, 8:42 AM   Name: Stephanie Lloyd MRN: [id number] Date of Birth: Jun 26, 1970

## 2016-05-31 NOTE — Progress Notes (Signed)
   Subjective:    Patient ID: Stephanie Lloyd, female    DOB: 02/16/1970, 46 y.o.   MRN: [id number]  HPI 46yo MAA P1 here for follow up after her EMBX   Review of Systems She has had no abdominal surgeries She denies GSUI    Objective:   Physical Exam Decent pubic arch for vaginal surgery No uterine descensus Pathology normal       Assessment & Plan:  DUB- I have offered her IUD, endometrial ablation versus TVH. She can call with her decision  And I will schedule whatever she wants.

## 2016-06-03 ENCOUNTER — Telehealth: Payer: Self-pay

## 2016-06-03 MED ORDER — MISOPROSTOL 200 MCG PO TABS: ORAL_TABLET | ORAL | 0 refills | Status: DC

## 2016-06-03 NOTE — Telephone Encounter (Signed)
Patient has decided to go with mirena insertion. Patient would like to see Dr. Marice Potterove. [place] to she would like to see Dr. Marice Potterove [facility] called in for patient and explained she should take it the night before the procedure. Patient states understanding. Armandina StammerJennifer Cordney Barstow RNBSN

## 2016-06-06 ENCOUNTER — Encounter: Payer: Self-pay | Admitting: Rehabilitation

## 2016-06-06 ENCOUNTER — Ambulatory Visit: Payer: Medicaid Other | Admitting: Occupational Therapy

## 2016-06-06 ENCOUNTER — Ambulatory Visit: Payer: Medicaid Other

## 2016-06-06 ENCOUNTER — Ambulatory Visit: Payer: Medicaid Other | Admitting: Rehabilitation

## 2016-06-06 DIAGNOSIS — R2681 Unsteadiness on feet: Secondary | ICD-10-CM

## 2016-06-06 DIAGNOSIS — M6281 Muscle weakness (generalized): Secondary | ICD-10-CM

## 2016-06-06 DIAGNOSIS — R471 Dysarthria and anarthria: Secondary | ICD-10-CM | POA: Diagnosis not present

## 2016-06-06 DIAGNOSIS — I69351 Hemiplegia and hemiparesis following cerebral infarction affecting right dominant side: Secondary | ICD-10-CM

## 2016-06-06 DIAGNOSIS — I69318 Other symptoms and signs involving cognitive functions following cerebral infarction: Secondary | ICD-10-CM

## 2016-06-06 DIAGNOSIS — I69354 Hemiplegia and hemiparesis following cerebral infarction affecting left non-dominant side: Secondary | ICD-10-CM

## 2016-06-06 DIAGNOSIS — R2689 Other abnormalities of gait and mobility: Secondary | ICD-10-CM

## 2016-06-06 NOTE — Therapy (Signed)
[facility] [address] [place], [place], [address] Phone: [phone]   Fax:  [phone]  Physical Therapy Treatment  Patient Details  Name: Stephanie Lloyd MRN: [id number] Date of Birth: 11/28/1970 Referring Provider: Ann Held, DO  Encounter Date: 06/06/2016      PT End of Session - 06/06/16 1109    Visit Number 8   Number of Visits 9   Date for PT Re-Evaluation 06/16/16   Authorization Type MCD-8 visits approved from 2/19-5/27/18   Authorization - Visit Number 7   Authorization - Number of Visits 8   PT Start Time 9211   PT Stop Time 1146   PT Time Calculation (min) 43 min   Equipment Utilized During Treatment Gait belt   Activity Tolerance Patient tolerated treatment well   Behavior During Therapy [facility] for tasks assessed/performed      Past Medical History:  Diagnosis Date  . Diabetes ([place])   . Diabetes mellitus without complication ([place])   . Diastolic dysfunction   . Hypertension   . Immune deficiency disorder ([place])   . Menometrorrhagia 04/01/2016  . Stroke [facility])     Past Surgical History:  Procedure Laterality Date  . NO PAST SURGERIES      There were no vitals filed for this visit.      Subjective Assessment - 06/06/16 1107    Subjective Nothing new to report, no falls.    Pertinent History Goes by "Stephanie Lloyd" (husband Stephanie Lloyd)    Limitations House hold activities;Walking;Standing   Currently in Pain? No/denies            [facility] PT Assessment - 06/06/16 1132      Ambulation/Gait   Ambulation/Gait Yes   Ambulation/Gait Assistance 6: Modified independent (Device/Increase time)   Ambulation/Gait Assistance Details Provided min cues for posture, however pt is safe to ambulate at mod I level.   Ambulated over paved, unlevel and outdoor grassy surfaces at independent level.  No LOB and pt able to kick  ball during gait.    Assistive device None   Gait Pattern Step-through  pattern;Decreased arm swing - right;Decreased arm swing - left;Decreased stride length;Decreased hip/knee flexion - right;Decreased hip/knee flexion - left;Lateral hip instability;Shuffle;Narrow base of support   Ambulation Surface Level;Indoor;Unlevel;Outdoor;Paved;Grass   Gait velocity 3.42 ft/sec without AD     6 Minute Walk- Baseline   6 Minute Walk- Baseline yes   BP (mmHg) 144/80   HR (bpm) 66   Modified Borg Scale for Dyspnea 0- Nothing at all   Perceived Rate of Exertion (Borg) 6-     6 Minute walk- Post Test   6 Minute Walk Post Test yes   BP (mmHg) 142/82   HR (bpm) 67   Modified Borg Scale for Dyspnea 0- Nothing at all   Perceived Rate of Exertion (Borg) 9- very light     6 minute walk test results    Aerobic Endurance Distance Walked 1107   Endurance additional comments no AD, good gait speed                     [facility] Adult PT Treatment/Exercise - 06/06/16 1132      Transfers   Transfers Sit to Stand;Stand to Sit   Sit to Stand 7: Independent   Five time sit to stand comments  12.62 secs without UE support    Stand to Sit 7: Independent     Ambulation/Gait   Ambulation Distance (Feet) 800  Feet  and 1107 for 6MWT     Neuro Re-ed    Neuro Re-ed Details  High level balance over outdoor grassy surface; kicking ball while walking, alternating LEs progressing to a very light jog while kicking ball, and ending with jogging over paved surfaces.  Pt tolerated all very well and without any LOB.                 PT Education - 06/06/16 1109    Education provided Yes   Education Details beginning light jogging outdoors.    Person(s) Educated Patient   Methods Explanation   Comprehension Verbalized understanding          PT Short Term Goals - 03/14/16 1233      PT SHORT TERM GOAL #1   Title Pt will perform initial HEP with mod I using paper handout to indicate safe compliance, maximize functional gains. Target date: Following 4th visit after  eval)   Baseline dependent for current program   Time 4   Period Weeks   Status New     PT SHORT TERM GOAL #2   Title Pt will improve BERG balance score to >52/56 in order to indicate decreased fall risk.     Baseline 46/56 on 03/14/16   Time 4   Period Weeks   Status New     PT SHORT TERM GOAL #3   Title Pt will increase gait speed to 1.59 ft/sec in order to indicate decreased fall risk and improved efficiency of gait.     Baseline .99 ft/sec on 03/14/16   Time 4   Period Weeks   Status New     PT SHORT TERM GOAL #4   Title Will assess 6MWT and improve 150' from baseline in order to indicate improved functional endurnace.    Baseline did not have time to assess on eval   Time 4   Period Weeks   Status New     PT SHORT TERM GOAL #5   Title Pt will perform floor recovery transfer at mod I level in order to indicate safe transfer from floor and safety getting up from air mattress.     Baseline requires assist from husband and RW at this time   Time 4   Period Weeks   Status New     Additional Short Term Goals   Additional Short Term Goals Yes     PT SHORT TERM GOAL #6   Title Pt will ambulate up to 200' over indoor surfaces with SPC at S level in order to indicate increased independence with household gait.    Baseline S to mod I level with RW over indoor surfaces   Time 4   Period Weeks   Status New     PT SHORT TERM GOAL #7   Title Pt will improve 5TSS time to <20 secs without UE support in order to indicate improved functional endurance.     Baseline 29.50 secs with UE support on lap           PT Long Term Goals - 06/06/16 1122      PT LONG TERM GOAL #1   Title Pt will be independent with handout with final HEP in order to indicate improved functional mobility.  (Target Date: following 8th visit after eval or by 06/23/16)   Baseline Pt is participating in informal walking program, will need to go over formal HEP in future visits.     Time 8   Period  Weeks    Status On-going     PT LONG TERM GOAL #2   Title Will perform FGA as able and set goal as appropriate to indicate improved balance.     Baseline 16/30 on FGA on 05/02/16   Time 8   Period Weeks   Status Achieved     PT LONG TERM GOAL #3   Title Pt will improve gait speed to 2.19 ft/sec in order to indicate decreased fall risk and improved efficiency of gait.     Baseline 3.42 ft/sec without AD on 06/06/16   Time 8   Period Weeks   Status Achieved     PT LONG TERM GOAL #4   Title Pt will ambulate up to 500' over unlevel paved surfaces (including ramp/curb) w/ SPC or LRAD at S level (due to cognition) in order to improve community negotiation.     Baseline 500' over unlevel paved surfaces without AD at S level, would like to update to mod I level due to progress.    Time 8   Period Weeks   Status Achieved     PT LONG TERM GOAL #5   Title Pt will improve 6MWT by 300' from baseline in order to indicate improved functional endurance.     Baseline Pt went from 23' at baseline to 39' an increase of 197', and from 8' to 1107' an increase of 231' (428' increase from baseline)   Time 8   Period Weeks   Status Achieved     PT LONG TERM GOAL #6   Title Pt will perform 5TSS in </=15 secs without UE support in order to indicate improved functional strength.     Baseline 12.60 secs without UE support on 06/06/16   Time 8   Period Weeks   Status Achieved     PT LONG TERM GOAL #7   Title Pt will improve FGA to >/= 19/30 on FGA in order to indicate decreased fall risk.    Time 8   Period Weeks   Status New               Plan - 06/06/16 1109    Clinical Impression Statement Skilled session focused on beginning to address LTGs and high level balance over outdoor surfaces.  Pt has met 6MWT test increasing functional endurance, 5TSS time indicative of increased functional strength, and gait speed indicative of decreased fall risk and improved efficiency of gait.  Pt on target to meet  remaining goals on next visit.    Rehab Potential Good   PT Frequency 1x / week   PT Duration 6 weeks   PT Treatment/Interventions ADLs/Self Care Home Management;Electrical Stimulation;DME Instruction;Gait training;Stair training;Functional mobility training;Therapeutic activities;Therapeutic exercise;Balance training;Neuromuscular re-education;Patient/family education;Orthotic Fit/Training;Energy conservation;Vestibular;Visual/perceptual remediation/compensation   PT Next Visit Plan check remaining LTGs and D/C    PT Home Exercise Plan updated HEP 05/13/16   Consulted and Agree with Plan of Care Patient      Patient will benefit from skilled therapeutic intervention in order to improve the following deficits and impairments:  Abnormal gait, Decreased activity tolerance, Decreased balance, Decreased cognition, Decreased endurance, Decreased knowledge of use of DME, Decreased mobility, Decreased strength, Impaired perceived functional ability, Impaired flexibility, Impaired UE functional use, Improper body mechanics, Impaired vision/preception, Postural dysfunction  Visit Diagnosis: Hemiplegia and hemiparesis following cerebral infarction affecting left non-dominant side (HCC)  Unsteadiness on feet  Muscle weakness (generalized)  Other abnormalities of gait and mobility     Problem List Patient Active  Problem List   Diagnosis Date Noted  . Iron deficiency anemia due to chronic blood loss 04/01/2016  . Menometrorrhagia 04/01/2016  . Upper respiratory infection 03/25/2016  . History of stroke   . History of intracerebral hemorrhage without residual deficit   . Cerebrovascular accident (CVA) with involvement of left side of body ([place]) 02/28/2016  . Ischemic stroke ([place]) 02/28/2016  . Acute kidney injury ([place]) 10/28/2015  . Acute encephalopathy 10/28/2015  . Diastolic dysfunction   . Stroke (cerebrum) ([place])   . Focal infarction of brain ([place]) 10/08/2015  . Syncope 08/19/2015  .  Hypertensive emergency 07/29/2015  . Hypertensive urgency 07/28/2015  . Hypokalemia 07/28/2015  . Accelerated hypertension 09/21/2014  . Type 2 diabetes mellitus with other circulatory complications ([place]) 49/61/1643  . Hyperlipidemia 09/21/2014  . 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 Hyperlipidemia LDL goal <70 07/29/2014  . Hemorrhagic stroke ([place]) 06/22/2014  . ICH (intracerebral hemorrhage) ([place]) 06/22/2014  . Essential hypertension 05/13/2012  . Diabetes mellitus ([place]) 05/13/2012   Cameron Sprang, PT, MPT [facility] [address] [place], [place], [address] Phone: [phone]   Fax:  [phone] 06/06/16, 12:58 PM  Name: Stephanie Lloyd MRN: [id number] Date of Birth: 02/25/1970

## 2016-06-06 NOTE — Therapy (Signed)
[facility] [address] [place], [place], [address] Phone: [phone]   Fax:  [phone]  Occupational Therapy Treatment  Patient Details  Name: Stephanie Lloyd MRN: [id number] Date of Birth: 07/30/1970 Referring Provider: Dr. Lyndal Pulley  Encounter Date: 06/06/2016      OT End of Session - 06/06/16 0957    Visit Number 9   Number of Visits 9   Date for OT Re-Evaluation 05/12/16   Authorization Type MCD - pt approved for 8 visits by 06/23/2016   Authorization Time Period 03/18/16 - 05/12/16, date extension approved 05/13/16 - 06/23/16   Authorization - Visit Number 9   Authorization - Number of Visits 9   OT Start Time 6803   OT Stop Time 1015   OT Time Calculation (min) 40 min   Activity Tolerance Patient tolerated treatment well      Past Medical History:  Diagnosis Date  . Diabetes ([place])   . Diabetes mellitus without complication ([place])   . Diastolic dysfunction   . Hypertension   . Immune deficiency disorder ([place])   . Menometrorrhagia 04/01/2016  . Stroke [facility])     Past Surgical History:  Procedure Laterality Date  . NO PAST SURGERIES      There were no vitals filed for this visit.      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 No/denies                      OT Treatments/Exercises (OP) - 06/06/16 0001      ADLs   Cooking Pt making omelette and cutting small amount of pepper and onion with direct supervision. Pt safe with larger pcs but recommended pt use chopper at home for dicing. Also recommended if to chop larger pcs, to have direct supervision - pt agreed. Pt remembered to adjust and turn off stove, however did require min cues for safety with chopping and general safety.    ADL Comments Assessed goals and progress to date                      OT Long Term Goals - 06/06/16 0955      OT LONG TERM GOAL #1   Title Independent with coordination HEP and putty HEP    Baseline DEPENDENT   Time 8   Period Weeks   Status Achieved     OT LONG TERM GOAL #2   Title Improve coordination bilaterally as evidenced by reducing speed on 9 hole peg test by 5 sec. or more   Baseline eval: Rt = 43.25 sec, Lt = 45.57 sec   Time 8   Period Weeks   Status Achieved  R= 32.47, L = 34.60     OT LONG TERM GOAL #3   Title Grip strength Rt dominant hand to be 45 lbs or greater to open jars/containers   Baseline eval = 38 lbs (Lt = 50 lbs)    Time 8   Period Weeks   Status Achieved  R= 55, L= 65     OT LONG TERM GOAL #4   Title Pt/family to verbalize understanding with memory compensatory strategies and how to implement    Baseline dependent   Time 8   Period Weeks   Status Achieved     OT LONG TERM  GOAL #5   Title Pt to fully return to laundry tasks and consistently preparing snacks, cold meal, microwaveable items    Baseline dependent since recent stroke   Time 8   Period Weeks   Status Achieved  However, pt needs direct supervision/cueing for cooking (stovetop, oven, and/or chopping)     OT LONG TERM GOAL #6   Title Cognition goal TBD prn    Baseline (based on MOCA score next visit)    Time 8   Period Weeks   Status Achieved  Pts scored 22/30 on MOCA  Pt's MOCA score showed predominately ST memory and working memory deficits as well as language issues . ST addressing language issues.  See  goal #4 above.                 Plan - 06/06/16 0956    Clinical Impression Statement Pt met all LTG's. Pt will need direct supervision for cooking   OT Treatment/Interventions Self-care/ADL training;Neuromuscular education;Therapeutic exercise;Cognitive remediation/compensation;Therapeutic activities;Patient/family education;Visual/perceptual remediation/compensation   Plan D/C O.T.    Consulted and Agree with Plan of Care Patient       Patient will benefit from skilled therapeutic intervention in order to improve the following deficits and impairments:  Decreased coordination, Decreased endurance, Decreased safety awareness, Decreased activity tolerance, Decreased knowledge of precautions, Impaired tone, Decreased balance, Decreased knowledge of use of DME, Impaired UE functional use, Decreased cognition, Decreased mobility, Decreased strength, Impaired perceived functional ability, Impaired vision/preception  Visit Diagnosis: Hemiplegia and hemiparesis following cerebral infarction affecting right dominant side (HCC)  Other symptoms and signs involving cognitive functions following cerebral infarction    Problem List Patient Active Problem List   Diagnosis Date Noted  . Iron deficiency anemia due to chronic blood loss 04/01/2016  . Menometrorrhagia 04/01/2016  . Upper respiratory infection 03/25/2016  . History of stroke   . History of intracerebral hemorrhage without residual deficit   . Cerebrovascular accident (CVA) with involvement of left side of body ([place]) 02/28/2016  . Ischemic stroke ([place]) 02/28/2016  . Acute kidney injury ([place]) 10/28/2015  . Acute encephalopathy 10/28/2015  . Diastolic dysfunction   . Stroke (cerebrum) ([place])   . Focal infarction of brain ([place]) 10/08/2015  . Syncope 08/19/2015  . Hypertensive emergency 07/29/2015  . Hypertensive urgency 07/28/2015  . Hypokalemia 07/28/2015  . Accelerated hypertension 09/21/2014  . Type 2 diabetes mellitus with other circulatory complications ([place]) 56/31/4970  . Hyperlipidemia 09/21/2014  . 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 Hyperlipidemia LDL goal <70 07/29/2014  . Hemorrhagic stroke ([place]) 06/22/2014  . ICH (intracerebral hemorrhage) ([place]) 06/22/2014  . Essential hypertension 05/13/2012  . Diabetes mellitus ([place]) 05/13/2012     OCCUPATIONAL THERAPY DISCHARGE SUMMARY  Visits from Start of Care: 9  Current functional level  related to goals / functional outcomes: See above   Remaining deficits: Occasional RUE pain Bilateral coordination  RUE strength Cognition including memory and executive functioning skills   Education / Equipment: HEP's for bilateral coordination, strength. Memory strategies, and safety recommendations  Plan: Patient agrees to discharge.  Patient goals were met. Patient is being discharged due to meeting the stated rehab goals.  ?????        Carey Bullocks, OTR/L 06/06/2016, 10:22 AM  [facility] [address] [place], [place], [address] Phone: [phone]   Fax:  [phone]  Name: Stephanie Lloyd MRN: [id number] Date of Birth: 07-Dec-1970

## 2016-06-10 ENCOUNTER — Encounter: Payer: Self-pay | Admitting: Rehabilitation

## 2016-06-10 ENCOUNTER — Ambulatory Visit (INDEPENDENT_AMBULATORY_CARE_PROVIDER_SITE_OTHER): Payer: Medicaid Other | Admitting: Neurology

## 2016-06-10 ENCOUNTER — Ambulatory Visit: Payer: Medicaid Other | Admitting: Rehabilitation

## 2016-06-10 ENCOUNTER — Encounter: Payer: Self-pay | Admitting: Neurology

## 2016-06-10 VITALS — BP 110/76 | HR 71 | Ht 70.0 in | Wt 185.0 lb

## 2016-06-10 DIAGNOSIS — E785 Hyperlipidemia, unspecified: Secondary | ICD-10-CM

## 2016-06-10 DIAGNOSIS — I6302 Cerebral infarction due to thrombosis of basilar artery: Secondary | ICD-10-CM

## 2016-06-10 DIAGNOSIS — R471 Dysarthria and anarthria: Secondary | ICD-10-CM | POA: Diagnosis not present

## 2016-06-10 DIAGNOSIS — E1159 Type 2 diabetes mellitus with other circulatory complications: Secondary | ICD-10-CM

## 2016-06-10 DIAGNOSIS — G4733 Obstructive sleep apnea (adult) (pediatric): Secondary | ICD-10-CM | POA: Diagnosis not present

## 2016-06-10 DIAGNOSIS — R2681 Unsteadiness on feet: Secondary | ICD-10-CM

## 2016-06-10 DIAGNOSIS — I10 Essential (primary) hypertension: Secondary | ICD-10-CM | POA: Diagnosis not present

## 2016-06-10 DIAGNOSIS — R2689 Other abnormalities of gait and mobility: Secondary | ICD-10-CM

## 2016-06-10 DIAGNOSIS — M6281 Muscle weakness (generalized): Secondary | ICD-10-CM

## 2016-06-10 DIAGNOSIS — I69351 Hemiplegia and hemiparesis following cerebral infarction affecting right dominant side: Secondary | ICD-10-CM

## 2016-06-10 MED ORDER — ATORVASTATIN CALCIUM 80 MG PO TABS: 80.0000 mg | ORAL_TABLET | Freq: Every day | ORAL | 3 refills | Status: DC

## 2016-06-10 NOTE — Patient Instructions (Signed)
-   continue ASA and lipitor for stroke prevention - get BP monitoring device and check BP at home - discuss with PCP about insulin regimen - will do sleep test to evaluate for sleep apnea. - diabetic diet and regular exercise - Follow up with your primary care physician for stroke risk factor modification. Recommend maintain blood pressure goal <130/80, diabetes with hemoglobin A1c goal below 6.5% and lipids with LDL cholesterol goal below 70 mg/dL.  - follow up in 4 months

## 2016-06-10 NOTE — Therapy (Signed)
[facility] [address] [place], [place], [address] Phone: [phone]   Fax:  [phone]  Physical Therapy Treatment and D/C Summary   Patient Details  Name: Stephanie Lloyd MRN: [id number] Date of Birth: [birth year] Referring Provider: Ann Held, DO  Encounter Date: 06/10/2016      PT End of Session - 06/10/16 1403    Visit Number 9   Number of Visits 9   Date for PT Re-Evaluation 06/16/16   Authorization Type MCD-8 visits approved from 2/19-5/27/18   Authorization - Visit Number 8   Authorization - Number of Visits 8   PT Start Time 0071   PT Stop Time 1443   PT Time Calculation (min) 41 min   Equipment Utilized During Treatment Gait belt   Activity Tolerance Patient tolerated treatment well   Behavior During Therapy [facility] for tasks assessed/performed      Past Medical History:  Diagnosis Date  . Diabetes ([place])   . Diabetes mellitus without complication ([place])   . Diastolic dysfunction   . Hypertension   . Immune deficiency disorder ([place])   . Menometrorrhagia 04/01/2016  . Stroke [facility])     Past Surgical History:  Procedure Laterality Date  . NO PAST SURGERIES      There were no vitals filed for this visit.      Subjective Assessment - 06/10/16 1402    Subjective Reports things are going great, no falls.    Pertinent History Goes by "[name]" (husband Louie Casa)    Limitations House hold activities;Walking;Standing   Currently in Pain? No/denies            [facility] PT Assessment - 06/10/16 1425      Functional Gait  Assessment   Gait assessed  Yes   Gait Level Surface Walks 20 ft in less than 7 sec but greater than 5.5 sec, uses assistive device, slower speed, mild gait deviations, or deviates 6-10 in outside of the 12 in walkway width.   Change in Gait Speed Able to smoothly change walking speed without loss of balance or gait deviation. Deviate no more than 6 in outside of the 12 in  walkway width.   Gait with Horizontal Head Turns Performs head turns smoothly with no change in gait. Deviates no more than 6 in outside 12 in walkway width   Gait with Vertical Head Turns Performs head turns with no change in gait. Deviates no more than 6 in outside 12 in walkway width.   Gait and Pivot Turn Pivot turns safely within 3 sec and stops quickly with no loss of balance.   Step Over Obstacle Is able to step over one shoe box (4.5 in total height) but must slow down and adjust steps to clear box safely. May require verbal cueing.   Gait with Narrow Base of Support Is able to ambulate for 10 steps heel to toe with no staggering.   Gait with Eyes Closed Walks 20 ft, slow speed, abnormal gait pattern, evidence for imbalance, deviates 10-15 in outside 12 in walkway width. Requires more than 9 sec to ambulate 20 ft.   Ambulating Backwards Walks 20 ft, uses assistive device, slower speed, mild gait deviations, deviates 6-10 in outside 12 in walkway width.   Steps Alternating feet, no rail.   Total Score 24        NMR/TE:  Went over current HEP, see pt instruction for details on exercises and reps performed.    Also assessed  gait speed during session for FGA and it was noted to be 2.97 ft/sec without AD.                       PT Education - 06/10/16 1402    Education provided Yes   Education Details continue to educate on compliance with HEP   Person(s) Educated Patient   Methods Explanation   Comprehension Verbalized understanding          PT Short Term Goals - 03/14/16 1233      PT SHORT TERM GOAL #1   Title Pt will perform initial HEP with mod I using paper handout to indicate safe compliance, maximize functional gains. Target date: Following 4th visit after eval)   Baseline dependent for current program   Time 4   Period Weeks   Status New     PT SHORT TERM GOAL #2   Title Pt will improve BERG balance score to >52/56 in order to indicate decreased fall  risk.     Baseline 46/56 on 03/14/16   Time 4   Period Weeks   Status New     PT SHORT TERM GOAL #3   Title Pt will increase gait speed to 1.59 ft/sec in order to indicate decreased fall risk and improved efficiency of gait.     Baseline .99 ft/sec on 03/14/16   Time 4   Period Weeks   Status New     PT SHORT TERM GOAL #4   Title Will assess 6MWT and improve 150' from baseline in order to indicate improved functional endurnace.    Baseline did not have time to assess on eval   Time 4   Period Weeks   Status New     PT SHORT TERM GOAL #5   Title Pt will perform floor recovery transfer at mod I level in order to indicate safe transfer from floor and safety getting up from air mattress.     Baseline requires assist from husband and RW at this time   Time 4   Period Weeks   Status New     Additional Short Term Goals   Additional Short Term Goals Yes     PT SHORT TERM GOAL #6   Title Pt will ambulate up to 200' over indoor surfaces with SPC at S level in order to indicate increased independence with household gait.    Baseline S to mod I level with RW over indoor surfaces   Time 4   Period Weeks   Status New     PT SHORT TERM GOAL #7   Title Pt will improve 5TSS time to <20 secs without UE support in order to indicate improved functional endurance.     Baseline 29.50 secs with UE support on lap           PT Long Term Goals - 06/10/16 1403      PT LONG TERM GOAL #1   Title Pt will be independent with handout with final HEP in order to indicate improved functional mobility.  (Target Date: following 8th visit after eval or by 06/23/16)   Baseline Pt is participating in informal walking program, will need to go over formal HEP in future visits.     Time 8   Period Weeks   Status Achieved     PT LONG TERM GOAL #2   Title Will perform FGA as able and set goal as appropriate to indicate improved balance.  Baseline 16/30 on FGA on 05/02/16   Time 8   Period Weeks   Status  Achieved     PT LONG TERM GOAL #3   Title Pt will improve gait speed to 2.19 ft/sec in order to indicate decreased fall risk and improved efficiency of gait.     Baseline 3.42 ft/sec without AD on 06/06/16   Time 8   Period Weeks   Status Achieved     PT LONG TERM GOAL #4   Title Pt will ambulate up to 500' over unlevel paved surfaces (including ramp/curb) w/ SPC or LRAD at S level (due to cognition) in order to improve community negotiation.     Baseline 500' over unlevel paved surfaces without AD at S level, would like to update to mod I level due to progress.    Time 8   Period Weeks   Status Achieved     PT LONG TERM GOAL #5   Title Pt will improve 6MWT by 300' from baseline in order to indicate improved functional endurance.     Baseline Pt went from 47' at baseline to 70' an increase of 197', and from 11' to 1107' an increase of 231' (428' increase from baseline)   Time 8   Period Weeks   Status Achieved     PT LONG TERM GOAL #6   Title Pt will perform 5TSS in </=15 secs without UE support in order to indicate improved functional strength.     Baseline 12.60 secs without UE support on 06/06/16   Time 8   Period Weeks   Status Achieved     PT LONG TERM GOAL #7   Title Pt will improve FGA to >/= 19/30 on FGA in order to indicate decreased fall risk.    Baseline 24/30 on 06/10/16   Time 8   Period Weeks   Status Achieved               Plan - 06/10/16 1403    Clinical Impression Statement Pt has met 7/7 LTGs and is ready for D/C.  Pt has made marked progress with balance, endurance and overall strength.  Pt verbalized understanding.    Rehab Potential Good   PT Frequency 1x / week   PT Duration 6 weeks   PT Treatment/Interventions ADLs/Self Care Home Management;Electrical Stimulation;DME Instruction;Gait training;Stair training;Functional mobility training;Therapeutic activities;Therapeutic exercise;Balance training;Neuromuscular re-education;Patient/family  education;Orthotic Fit/Training;Energy conservation;Vestibular;Visual/perceptual remediation/compensation   PT Next Visit Plan --   PT Home Exercise Plan updated HEP 05/13/16   Consulted and Agree with Plan of Care Patient      Patient will benefit from skilled therapeutic intervention in order to improve the following deficits and impairments:  Abnormal gait, Decreased activity tolerance, Decreased balance, Decreased cognition, Decreased endurance, Decreased knowledge of use of DME, Decreased mobility, Decreased strength, Impaired perceived functional ability, Impaired flexibility, Impaired UE functional use, Improper body mechanics, Impaired vision/preception, Postural dysfunction  Visit Diagnosis: Hemiplegia and hemiparesis following cerebral infarction affecting right dominant side (HCC)  Unsteadiness on feet  Muscle weakness (generalized)  Other abnormalities of gait and mobility    PHYSICAL THERAPY DISCHARGE SUMMARY  Visits from Start of Care: 9  Current functional level related to goals / functional outcomes: See LTGs   Remaining deficits: Pt with high level balance, cognitive, and endurance deficits.  Has HEP to address these.    Education / Equipment: HEP  Plan: Patient agrees to discharge.  Patient goals were met. Patient is being discharged due to meeting the  stated rehab goals.  ?????        Problem List Patient Active Problem List   Diagnosis Date Noted  . OSA (obstructive sleep apnea) 06/10/2016  . Iron deficiency anemia due to chronic blood loss 04/01/2016  . Menometrorrhagia 04/01/2016  . Upper respiratory infection 03/25/2016  . History of stroke   . History of intracerebral hemorrhage without residual deficit   . 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 Ischemic stroke ([place]) 02/28/2016  . Acute kidney injury ([place]) 10/28/2015  . Acute encephalopathy 10/28/2015  . Diastolic dysfunction   . Stroke  (cerebrum) ([place])   . Focal infarction of brain ([place]) 10/08/2015  . Syncope 08/19/2015  . Hypertensive emergency 07/29/2015  . Hypertensive urgency 07/28/2015  . Hypokalemia 07/28/2015  . Accelerated hypertension 09/21/2014  . Type 2 diabetes mellitus with other circulatory complications ([place]) 40/45/9136  . Hyperlipidemia 09/21/2014  . 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 Hyperlipidemia LDL goal <70 07/29/2014  . Hemorrhagic stroke ([place]) 06/22/2014  . ICH (intracerebral hemorrhage) ([place]) 06/22/2014  . Essential hypertension 05/13/2012  . Diabetes mellitus ([place]) 05/13/2012    Cameron Sprang, PT, MPT [facility] [address] [place], [place], [address] Phone: [phone]   Fax:  [phone] 06/10/16, 4:38 PM  Name: VERBENA BOEDING MRN: [id number] Date of Birth: 07/06/1970

## 2016-06-10 NOTE — Progress Notes (Signed)
STROKE NEUROLOGY FOLLOW UP NOTE  NAME: Stephanie Lloyd DOB: 1971/01/07  REASON FOR VISIT: stroke follow up HISTORY FROM: pt and chart  Today we had the pleasure of seeing Stephanie Lloyd in follow-up at our [facility]. Pt was accompanied by husband.   History Summary Stephanie Lloyd is a 46 y.o. female with PMH of HTN, DM was admitted on 06/22/14 for acute onset of left sided weakness, slurry speech and dysphagia. 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 She was admitted to neuro ICU with Cardene drip and repeat CT head secondary showed stable hematoma. Other stroke workup showed negative 2-D echo and carotid Doppler, however LDL 140 and A1c 9.0. She was started on Lipitor. She also had elevated creatinine. Her BP was further controlled by po meds. After stabilization, patient was discharged home with PCP follow-up in one week.   09/21/15 follow up - the patient has been doing well. Initially she needs walker for walking, but right now she is able to walk without any device. Left-sided weakness much resolved with only mild left hand dexterity difficulties. Her blood pressure today 116/73, and she reported compliant with medication. She has his pediatrics follow-up next months. She admitted snoring at night, however denies apnea. She declined a sleep study.  Admitted on 10/08/15 for right-sided numbness. MRI showed acute infarct within the posterior inferior left midbrain and pons on the left with multiple remote infarcts. Severe diffuse microbleds and ischemic white matter changes. Due to extensive microbleds, lacunar infarcts, significant white matter ischemic changes at such young age, single gene mutation associated vasculopathy such as COL4A1 and COL4A2 and CADASIL and CARASIL need to be considered. Genetic testing would be more informative. CTA H&N - High-grade left P2 segment stenosis. TTE unremarkable. LDL 102 and A1C 8.5. Put on ASA 81mg  and continued  pravastatin.   Admitted again on 02/28/16 for slurred speech and generalized weakness. MRI showed left paramedian pontine infarct, small right insular cortex infarct, and extensive old ischemic changes. EF 60-65% and no SOE. Increased ASA to 325mg . pt is not a good candidate for anticoagulation due to hx of ICH and extensive microbleds on MRI, also did not recommend plavix for the same reason. LDL 99, changed to lipitor 80. Genetic testing not approved by insurance.  Interval History During the interval time, pt has been doing well. Do not have BP machine at home but today BP good at 110/76. Her glucose not in good control, especially after meal. She is on 70/30 bid. Recommend to follow up with PCP to consider premeal insulin or refer to endocrinology. She has not been on lipitor since discharge, will refill. Pt still has mild dysarthria and right deltoid intermittent pain, but no weakness or numbness.   REVIEW OF SYSTEMS: Full 14 system review of systems performed and notable only for those listed below and in HPI above, all others are negative:  Constitutional:  Cardiovascular:  Ear/Nose/Throat:   Skin:  Eyes:  Blurry vision, double vision, light sensitivity Respiratory:   Gastroitestinal:   Genitourinary: incontinence of bladder Hematology/Lymphatic:  anemia Endocrine: heat intolerance Musculoskeletal:   Allergy/Immunology:   Neurological:  Facial drooping, memory loss, weakness, slurry speech Psychiatric: confusion Sleep: daytime sleepiness, snoring  The following represents the patient's updated allergies and side effects list: Allergies  Allergen Reactions  . Metformin And Related Diarrhea    The neurologically relevant items on the patient's problem list were reviewed on today's visit.  Neurologic Examination  A problem focused neurological exam (12 or more points of the single system neurologic examination, vital signs counts as 1 point, cranial nerves count for 8 points) was  performed.  Blood pressure 110/76, pulse 71, height 5\' 10"  (1.778 m), weight 185 lb (83.9 kg).  General - Well nourished, well developed, in no apparent distress.  Ophthalmologic - Sharp disc margins OU.  Cardiovascular - Regular rate and rhythm with no murmur.  Mental Status -  Level of arousal and orientation to time, place, and person were intact. Language including expression, naming, repetition, comprehension was assessed and found intact, mild dysarthria. Fund of Knowledge was assessed and was intact.  Cranial Nerves II - XII - II - Visual field intact OU. III, IV, VI - Extraocular movements intact. V - Facial sensation intact bilaterally. VII - Facial movement intact bilaterally. VIII - Hearing & vestibular intact bilaterally. X - Palate elevates symmetrically, mild dysarthria. XI - Chin turning & shoulder shrug intact bilaterally. XII - Tongue protrusion intact.  Motor Strength - The patient's strength was normal in all extremities and pronator drift was absent.  Bulk was normal and fasciculations were absent.   Motor Tone - Muscle tone was assessed at the neck and appendages and was normal.  Reflexes - The patient's reflexes were 1+ in all extremities and she had no pathological reflexes.  Sensory - Light touch, temperature/pinprick were assessed and were normal.    Coordination - The patient had normal movements in the hands and feet with no ataxia or dysmetria.  Tremor was absent.  Gait and Station - The patient's transfers, posture, gait, station, and turns were observed as normal.  Data reviewed: I personally reviewed the images and agree with the radiology interpretations.  Ct Head Wo Contrast  06/23/2014 IMPRESSION: 13 mm hemorrhage right thalamus with mild local mass-effect. No ventricular extension Atrophy and chronic microvascular ischemia.   06/22/2014 IMPRESSION: 1. Acute approximately 1.5 cm right thalamic intraparenchymal hemorrhage without  intraventricular extension or significant mass effect. While potentially hypertensive in etiology, given extensive age advanced microvascular ischemic disease, hemorrhagic conversion of a microvascular infarct could have a similar appearance. As such, further evaluation with brain MRI is recommended. 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   Carotid Doppler - Bilateral: 1-39% ICA stenosis. Vertebral artery flow is antegrade.  2D Echocardiogram - Left ventricle: The cavity size was normal. Wall thickness was increased in a pattern of mild LVH. Systolic function was normal. The estimated ejection fraction was in the range of 60% to 65%. Wall motion was normal; there were no regional wall motion abnormalities. Features are consistent with a pseudonormal left ventricular filling pattern, with concomitant abnormal relaxation and increased filling pressure (grade 2 diastolic dysfunction). Doppler parameters are consistent with high ventricular filling pressure. - Atrial septum: No defect or patent foramen ovale was identified. Impressions: - No cardiac source of emboli was indentified.  MRA head 09/2014 1. The right posterior cerebral artery distal segments have moderate to severe stenosis. 2. The right internal carotid artery supraclinoid segment has moderate focal stenosis. 3. The right A1 segment origin has moderate stenosis. 4. The left posterior cerebral artery has mild irregular atherosclerotic changes.   MRI brain 09/2014 1. 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 2. Moderate periventricular and subcortical and pontine chronic small vessel ischemic disease. In addition, there are numerous chronic cerebral microhemorrhages in the bilateral cerebral hemispheres, right pons and right cerebellum, also likely related  to chronic small vessel ischemic disease.  3. No  acute findings.   MRI brain 10/08/15 1. Acute 7 mm nonhemorrhagic infarct within the posterior inferior left midbrain and pons on the left. 2. Multiple more remote lacunar infarcts are present throughout the lower midbrain and upper pons bilaterally. 3. Multiple remote lacunar infarcts are present throughout the basal ganglia. 4. Advanced diffuse white matter disease. 5. Multiple foci of susceptibility throughout the brainstem and bilateral cerebral hemispheres suggesting underlying vasculitis.  CTA head and neck 10/08/15 1. No acute arterial finding. 2. Intracranial atherosclerosis with notable high-grade left P2 segment stenosis. 3. Mild atherosclerosis of the neck without stenosis. 4. Advanced chronic microvascular disease. Known acute brainstem infarct without evidence of progression or hemorrhage. 5. Small layering pleural effusions.  Brain MRI 02/28/16 Acute infarction affecting the left para median pons. Small focus of acute infarction in the right insular cortex. Extensive old ischemic changes throughout the brainstem, thalami and cerebral hemispheric white matter. Many of the old infarctions demonstrate hemosiderin deposition.  TTE 02/29/16 Left ventricle: The cavity size was normal. Wall thickness was   increased in a pattern of moderate LVH. Systolic function was   normal. The estimated ejection fraction was in the range of 60%   to 65%. Wall motion was normal; there were no regional wall   motion abnormalities. Doppler parameters are consistent with   abnormal left ventricular relaxation (grade 1 diastolic   dysfunction). - Aortic valve: There was trivial regurgitation. - Mitral valve: There was mild regurgitation. Impressions: - Normal LV systolic function; grade 1 diastolic dysfunction;   moderate LVH; trace AI; mild MR.   Component     Latest Ref Rng 06/23/2014  Cholesterol     0 - 200 mg/dL 952 (H)  Triglycerides     <150 mg/dL 41  HDL Cholesterol     >40  mg/dL 72  Total CHOL/HDL Ratio      3.1  VLDL     0 - 40 mg/dL 8  LDL (calc)     0 - 99 mg/dL 841 (H)  Hemoglobin L2G     4.8 - 5.6 % 9.0 (H)  Mean Plasma Glucose      212  TSH     0.350 - 4.500 uIU/mL 0.548   Component     Latest Ref Rng & Units 10/09/2015 10/28/2015 02/29/2016 03/01/2016  Cholesterol     0 - 200 mg/dL 401  027   Triglycerides     <150 mg/dL 253  74   HDL Cholesterol     >40 mg/dL 50  56   Total CHOL/HDL Ratio     RATIO 3.5  3.0   VLDL     0 - 40 mg/dL 22  15   LDL (calc)     0 - 99 mg/dL 664 (H)  99   Hemoglobin A1C     4.8 - 5.6 % 8.5 (H)  6.9 (H)   Mean Plasma Glucose     mg/dL 403  474   RPR     Non Reactive  Non Reactive    Vitamin B12     211 - 911 pg/mL  495    TSH     0.450 - 4.500 uIU/mL    0.755   Component     Latest Ref Rng & Units 03/07/2016 05/20/2016  Cholesterol     0 - 200 mg/dL    Triglycerides     <259 mg/dL    HDL Cholesterol     >  40 mg/dL    Total CHOL/HDL Ratio     RATIO    VLDL     0 - 40 mg/dL    LDL (calc)     0 - 99 mg/dL    Hemoglobin Z6X     4.8 - 5.6 %    Mean Plasma Glucose     mg/dL    RPR     Non Reactive    Vitamin B12     211 - 911 pg/mL 411   TSH     0.450 - 4.500 uIU/mL  1.230    Assessment: As you may recall, she is a 46 y.o. African American female with PMH of HTN and DM was admitted on 06/22/14 for right thalamic small ICH. She was admitted to neuro ICU with Cardene drip and repeat CT head secondary showed stable hematoma. LDL 140 and A1c 9.0. She was started on Lipitor. She also had elevated creatinine. Her BP was further controlled by po meds. After discharge, left-sided weakness much improved. Repeat MRI and MRA showed hematoma resolution, and no AVM or aneurysm, however extensive CMBs and WM ischemic changes.   Admitted on 10/08/15 for acute infarct within the posterior inferior left midbrain and pons on the left with multiple remote infarcts. Severe diffuse microbleds and ischemic white matter  changes. CTA H&N - High-grade left P2 segment stenosis. TTE EF 65-70%. LDL 102 and A1C 8.5. Put on ASA 81mg  and continued pravastatin.   Admitted again on 02/28/16 for left paramedian pontine infarct, small right insular cortex infarct, and extensive old ischemic changes. EF 60-65% and no SOE. Increased ASA to 325mg . pt is not a good candidate for anticoagulation due to hx of ICH and extensive microbleds on MRI, also did not recommend plavix for the same reason. LDL 99, changed to lipitor 80.   She does have multiple risk factors (HTN, DM, HLD), as well as extensive hx of lacunar infarcts, right thalamic ICH, extensive microbleds, and significant white matter ischemic changes, small vessel disease is most likely diagnosis. However, due to extensive microbleds, lacunar infarcts, significant white matter ischemic changes at such a young age, single gene mutation associated vasculopathy such as COL4A1, COL4A2, CADASIL and CARASIL need to be considered. Genetic testing would be more informative. Genetic testing so far not approved by insurance (Medicaid).  During the interval time, she has been doing well. Still has mild dysarthria. Not check BP at home and glucose not in good control. Pt also complain s/s of OSA, will do sleep study.    Plan:  - continue ASA and lipitor for stroke prevention - get BP monitoring device and check BP at home - discuss with PCP about insulin regimen - will do sleep test to evaluate for sleep apnea. - diabetic diet and regular exercise - Follow up with your primary care physician for stroke risk factor modification. Recommend maintain blood pressure goal <130/80, diabetes with hemoglobin A1c goal below 6.5% and lipids with LDL cholesterol goal below 70 mg/dL.  - follow up in 4 months  I spent more than 25 minutes of face to face time with the patient. Greater than 50% of time was spent in counseling and coordination of care. We discussed BP and glucose control, monitoring  and sleep study.    Orders Placed This Encounter  Procedures  . Ambulatory referral to Sleep Studies    Referral Priority:   Routine    Referral Type:   Consultation    Referral Reason:   Specialty  Services Required    Number of Visits Requested:   1    Meds ordered this encounter  Medications  . atorvastatin (LIPITOR) 80 MG tablet    Sig: Take 1 tablet (80 mg total) by mouth daily at 6 PM.    Dispense:  90 tablet    Refill:  3    Patient Instructions  - continue ASA and lipitor for stroke prevention - get BP monitoring device and check BP at home - discuss with PCP about insulin regimen - will do sleep test to evaluate for sleep apnea. - diabetic diet and regular exercise - Follow up with your primary care physician for stroke risk factor modification. Recommend maintain blood pressure goal <130/80, diabetes with hemoglobin A1c goal below 6.5% and lipids with LDL cholesterol goal below 70 mg/dL.  - follow up in 4 months   Marvel PlanJindong Ellesse Antenucci, MD PhD [facility] [address], [address] [place], [place] [address] [phone]

## 2016-06-10 NOTE — Patient Instructions (Signed)
   Walk with each foot directly in front of other, heel of one foot touching toes of other foot with each step. Both feet straight ahead. When you get to the end of the counter, repeat heel to toe backwards. Make sure you have counter top or back of couch to hold onto for safety, but use counter as lightly as possible.  Repeat x 3 reps down and back    Copyright  VHI. All rights reserved.  "I love a Parade" Lift    Do this along counter top or along back of couch. March forward, raising knees as high as you can, go slow! When you get to the end, go backwards, but make sure you are still taking big steps.  Repeat x 3 reps down and back.   http://[email or web address].exer.us/345  Copyright  VHI. All rights reserved.    For the next two: Stand in corner with chair in front of you for safety.   Feet Together (Compliant Surface) Arm Motion - Eyes Closed    Stand on compliant surface: ____pillow____ with feet together. Close eyes and move arms up and down: to front. Repeat __3__ times per session for 30 secs each. Do __1-2__ sessions per day.  Copyright  VHI. All rights reserved.   Feet Together (Compliant Surface) Head Motion - Eyes Closed    Stand on compliant surface: __[pillow______ with feet together. Close eyes and move head slowly, up and down x 10 reps and side to side x 10 reps Repeat _1___ times per session. Do _1-2___ sessions per day.  Copyright  VHI. All rights reserved.   Pelvic Tilt: Posterior - Legs Bent (Supine)    Tighten stomach and flatten back by rolling pelvis down. Hold _5___ seconds. Relax. Repeat _10___ times per set. Do __1__ sets per session. Do __2__ sessions per day.  http://orth.exer.us/203   Copyright  VHI. All rights reserved.   Bracing With Bridging (Hook-Lying)    With neutral spine, tighten pelvic floor and abdominals and hold. Lift bottom. Repeat _10__ times. Do _1-2__ times a day.   Copyright  VHI. All rights  reserved.       CAT AND CAMEL  While on your hands and knees in a crawl position, raise up your back and arch it towards the ceiling.  Next return to a lowered position and arch your back the opposite direction.  Do this 5 times each and hold each position x 5 seconds.  Make sure your hips are over your knees and shoulders over hands.

## 2016-06-17 ENCOUNTER — Ambulatory Visit: Payer: Self-pay | Admitting: Rehabilitation

## 2016-07-08 ENCOUNTER — Encounter: Payer: Self-pay | Admitting: Family Medicine

## 2016-07-08 ENCOUNTER — Ambulatory Visit (INDEPENDENT_AMBULATORY_CARE_PROVIDER_SITE_OTHER): Payer: Medicaid Other | Admitting: Family Medicine

## 2016-07-08 VITALS — BP 122/86 | HR 66 | Temp 97.5°F | Resp 16 | Ht 70.0 in | Wt 185.4 lb

## 2016-07-08 DIAGNOSIS — I10 Essential (primary) hypertension: Secondary | ICD-10-CM | POA: Diagnosis not present

## 2016-07-08 DIAGNOSIS — E1151 Type 2 diabetes mellitus with diabetic peripheral angiopathy without gangrene: Secondary | ICD-10-CM | POA: Diagnosis not present

## 2016-07-08 DIAGNOSIS — E785 Hyperlipidemia, unspecified: Secondary | ICD-10-CM | POA: Diagnosis not present

## 2016-07-08 DIAGNOSIS — M79601 Pain in right arm: Secondary | ICD-10-CM | POA: Diagnosis not present

## 2016-07-08 DIAGNOSIS — E1165 Type 2 diabetes mellitus with hyperglycemia: Secondary | ICD-10-CM

## 2016-07-08 DIAGNOSIS — IMO0002 Reserved for concepts with insufficient information to code with codable children: Secondary | ICD-10-CM

## 2016-07-08 LAB — COMPREHENSIVE METABOLIC PANEL
ALT: 18 U/L (ref 0–35)
AST: 17 U/L (ref 0–37)
Albumin: 4.4 g/dL (ref 3.5–5.2)
Alkaline Phosphatase: 74 U/L (ref 39–117)
BUN: 26 mg/dL — ABNORMAL HIGH (ref 6–23)
CHLORIDE: 102 meq/L (ref 96–112)
CO2: 28 meq/L (ref 19–32)
CREATININE: 1.35 mg/dL — AB (ref 0.40–1.20)
Calcium: 9.9 mg/dL (ref 8.4–10.5)
GFR: 53.78 mL/min — AB (ref >60.00)
GLUCOSE: 127 mg/dL — AB (ref 70–99)
Potassium: 4 mEq/L (ref 3.5–5.1)
Sodium: 137 mEq/L (ref 135–145)
Total Bilirubin: 0.3 mg/dL (ref 0.2–1.2)
Total Protein: 7.4 g/dL (ref 6.0–8.3)

## 2016-07-08 LAB — LIPID PANEL
CHOL/HDL RATIO: 3
Cholesterol: 199 mg/dL (ref 0–200)
HDL: 58.3 mg/dL (ref >39.00)
LDL Cholesterol: 123 mg/dL — ABNORMAL HIGH (ref 0–99)
NONHDL: 140.23
Triglycerides: 86 mg/dL (ref 0.0–149.0)
VLDL: 17.2 mg/dL (ref 0.0–40.0)

## 2016-07-08 LAB — HEMOGLOBIN A1C: HEMOGLOBIN A1C: 7.7 % — AB (ref 4.6–6.5)

## 2016-07-08 MED ORDER — INSULIN ASPART PROT & ASPART (70-30 MIX) 100 UNIT/ML PEN: PEN_INJECTOR | SUBCUTANEOUS | 11 refills | Status: DC

## 2016-07-08 MED ORDER — AMLODIPINE BESYLATE 10 MG PO TABS: 10.0000 mg | ORAL_TABLET | Freq: Every day | ORAL | 3 refills | Status: DC

## 2016-07-08 MED ORDER — LISINOPRIL 40 MG PO TABS: 40.0000 mg | ORAL_TABLET | Freq: Every day | ORAL | 3 refills | Status: DC

## 2016-07-08 NOTE — Progress Notes (Signed)
Patient ID: Stephanie Lloyd, female   DOB: [birth year], 46 y.o.   MRN: [id number]     Subjective:  I acted as a Education administrator for Dr. Carollee Herter.  Guerry Bruin, [place]   Patient ID: Stephanie Lloyd, female    DOB: Aug 24, 1970, 46 y.o.   MRN: [id number]  Chief Complaint  Patient presents with  . Hypertension  . Arm Pain    HPI  Patient is in today for follow up blood pressure.  Blood pressure outside of office have been running well.  At last dentist appointment pressure was in the 130s over 70 something.  Also she has had some right arm pain since April and would like it xrayed.  HPI HYPERTENSION   Blood pressure range-not checking   Chest pain- no      Dyspnea- no Lightheadedness- no   Edema- no  Other side effects - no   Medication compliance: good Low salt diet- yes    DIABETES    Blood Sugar ranges-200s   Polyuria- no New Visual problems- no  Hypoglycemic symptoms- no  Other side effects-no Medication compliance - good Last eye exam- see him next week Foot exam- today   HYPERLIPIDEMIA  Medication compliance- good RUQ pain- no  Muscle aches- no Other side effects-no   Patient Care Team: Ann Held, DO as PCP - General (Family Medicine) Marylynn Pearson, MD as Consulting Physician (Ophthalmology)   Past Medical History:  Diagnosis Date  . Diabetes ([place])   . Diabetes mellitus without complication ([place])   . Diastolic dysfunction   . Hypertension   . Immune deficiency disorder ([place])   . Menometrorrhagia 04/01/2016  . Stroke [facility])     Past Surgical History:  Procedure Laterality Date  . NO PAST SURGERIES      Family History  Problem Relation Age of Onset  . Diabetes Father   . Hypertension Father   . Stroke Father   . Diabetes Paternal Uncle   . Stroke Paternal Uncle   . Stroke Brother   . Cancer Neg Hx   . Breast cancer Neg Hx   . Ovarian cancer Neg Hx     Social History   Social History  . Marital status: Married    Spouse name: N/A  . Number of  children: N/A  . Years of education: N/A   Occupational History  . Not on file.   Social History Main Topics  . Smoking status: Never Smoker  . Smokeless tobacco: Never Used  . Alcohol use No     Comment: occ  . Drug use: No  . Sexual activity: Yes    Birth control/ protection: None   Other Topics Concern  . Not on file   Social History Narrative   ** Merged History Encounter **        Outpatient Medications Prior to Visit  Medication Sig Dispense Refill  . ACCU-CHEK FASTCLIX LANCETS MISC Check blood sugar twice daily. Dx E:11.9 (Patient taking differently: Check blood sugar twice daily. Dx E:11.9) 102 each 12  . aspirin EC 325 MG EC tablet Take 1 tablet (325 mg total) by mouth daily. 30 tablet 11  . atorvastatin (LIPITOR) 80 MG tablet Take 1 tablet (80 mg total) by mouth daily at 6 PM. 90 tablet 3  . Blood Glucose Monitoring Suppl (ACCU-CHEK GUIDE) w/Device KIT 1 Device by Does not apply route as directed. Check blood sugar daily 1 kit 0  . glucose blood (ACCU-CHEK GUIDE) test strip Check blood sugar twice  daily. Dx E:11.9 100 each 12  . levocetirizine (XYZAL) 5 MG tablet Take 1 tablet (5 mg total) by mouth every evening. 30 tablet 1  . misoprostol (CYTOTEC) 200 MCG tablet Take on tablet by mouth before the procedure. 2 tablet 0  . prednisoLONE acetate (PRED FORTE) 1 % ophthalmic suspension Place 1 drop into both eyes 2 days.    Marland Kitchen amLODipine (NORVASC) 10 MG tablet Take 1 tablet (10 mg total) by mouth daily. 90 tablet 3  . insulin NPH-regular Human (NOVOLIN 70/30 RELION) (70-30) 100 UNIT/ML injection Inject 15 Units into the skin 2 (two) times daily with a meal. (Patient taking differently: Inject 20 Units into the skin 2 (two) times daily with a meal. ) 10 mL 2  . lisinopril (PRINIVIL,ZESTRIL) 40 MG tablet Take 40 mg by mouth daily.     No facility-administered medications prior to visit.     Allergies  Allergen Reactions  . Metformin And Related Diarrhea    Review of  Systems  Constitutional: Negative for chills, fever and malaise/fatigue.  HENT: Negative for congestion and hearing loss.   Eyes: Negative for blurred vision and discharge.  Respiratory: Negative for cough, sputum production and shortness of breath.   Cardiovascular: Negative for chest pain, palpitations and leg swelling.  Gastrointestinal: Negative for abdominal pain, blood in stool, constipation, diarrhea, heartburn, nausea and vomiting.  Genitourinary: Negative for dysuria, frequency, hematuria and urgency.  Musculoskeletal: Negative for back pain, falls and myalgias.       Right arm pain   Skin: Negative for rash.  Neurological: Negative for dizziness, sensory change, loss of consciousness, weakness and headaches.  Endo/Heme/Allergies: Negative for environmental allergies. Does not bruise/bleed easily.  Psychiatric/Behavioral: Negative for depression and suicidal ideas. The patient is not nervous/anxious and does not have insomnia.        Objective:    Physical Exam  Constitutional: She is oriented to person, place, and time. She appears well-developed and well-nourished. No distress.  HENT:  Head: Normocephalic and atraumatic.  Eyes: Conjunctivae and EOM are normal.  Neck: Normal range of motion. Neck supple. No JVD present. Carotid bruit is not present. No thyromegaly present.  Cardiovascular: Normal rate, regular rhythm and normal heart sounds.   No murmur heard. Pulmonary/Chest: Effort normal and breath sounds normal. No respiratory distress. She has no wheezes. She has no rales. She exhibits no tenderness.  Abdominal: Soft. Bowel sounds are normal. There is no tenderness.  Musculoskeletal: She exhibits tenderness. She exhibits no edema or deformity.       Right shoulder: She exhibits decreased range of motion and tenderness.       Right upper arm: She exhibits tenderness.  Neurological: She is alert and oriented to person, place, and time.  Skin: Skin is warm and dry. She  is not diaphoretic.  Psychiatric: She has a normal mood and affect.  Sensory exam of the foot is normal, tested with the monofilament. Good pulses, no lesions or ulcers, good peripheral pulses.  BP 122/86 (BP Location: Left Arm, Cuff Size: Normal)   Pulse 66   Temp 97.5 F (36.4 C) (Oral)   Resp 16   Ht _0  (1.778 m)   Wt 185 lb 6.4 oz (84.1 kg)   SpO2 97%   BMI 26.60 kg/m  Wt Readings from Last 3 Encounters:  07/08/16 185 lb 6.4 oz (84.1 kg)  06/10/16 185 lb (83.9 kg)  05/31/16 188 lb (85.3 kg)   BP Readings from Last 3 Encounters:  07/08/16  122/86  06/10/16 110/76  05/31/16 (!) 144/80      There is no immunization history on file for this patient.  Health Maintenance  Topic Date Due  . HIV Screening  04/29/1983  . FOOT EXAM  02/16/2016  . PNEUMOCOCCAL POLYSACCHARIDE VACCINE (1) 04/05/2017 (Originally 04/29/1970)  . TETANUS/TDAP  04/05/2017 (Originally 04/29/1987)  . INFLUENZA VACCINE  08/28/2016  . HEMOGLOBIN A1C  08/28/2016  . OPHTHALMOLOGY EXAM  03/28/2017  . PAP SMEAR  05/21/2019    Lab Results  Component Value Date   WBC 8.0 05/20/2016   HGB 11.4 05/20/2016   HCT 35.3 05/20/2016   PLT 271 05/20/2016   GLUCOSE 112 (H) 04/10/2016   CHOL 170 02/29/2016   TRIG 74 02/29/2016   HDL 56 02/29/2016   LDLCALC 99 02/29/2016   ALT 17 04/01/2016   AST 15 04/01/2016   NA 135 04/10/2016   K 3.7 04/10/2016   CL 103 04/10/2016   CREATININE 1.22 (H) 04/10/2016   BUN 12 04/10/2016   CO2 25 04/10/2016   TSH 1.230 05/20/2016   INR 1.26 10/28/2015   HGBA1C 6.9 (H) 02/29/2016    Lab Results  Component Value Date   TSH 1.230 05/20/2016   Lab Results  Component Value Date   WBC 8.0 05/20/2016   HGB 11.4 05/20/2016   HCT 35.3 05/20/2016   MCV 84 05/20/2016   PLT 271 05/20/2016   Lab Results  Component Value Date   NA 135 04/10/2016   K 3.7 04/10/2016   CHLORIDE 105 04/01/2016   CO2 25 04/10/2016   GLUCOSE 112 (H) 04/10/2016   BUN 12 04/10/2016    CREATININE 1.22 (H) 04/10/2016   BILITOT <0.22 04/01/2016   ALKPHOS 88 04/01/2016   AST 15 04/01/2016   ALT 17 04/01/2016   PROT 7.0 04/01/2016   ALBUMIN 3.6 04/01/2016   CALCIUM 9.4 04/10/2016   ANIONGAP 7 04/10/2016   EGFR 58 (L) 04/01/2016   GFR 61.11 03/06/2016   Lab Results  Component Value Date   CHOL 170 02/29/2016   Lab Results  Component Value Date   HDL 56 02/29/2016   Lab Results  Component Value Date   LDLCALC 99 02/29/2016   Lab Results  Component Value Date   TRIG 74 02/29/2016   Lab Results  Component Value Date   CHOLHDL 3.0 02/29/2016   Lab Results  Component Value Date   HGBA1C 6.9 (H) 02/29/2016         Assessment & Plan:   Problem List Items Addressed This Visit      Unprioritized   Hyperlipidemia LDL goal <70   Relevant Medications   amLODipine (NORVASC) 10 MG tablet   lisinopril (PRINIVIL,ZESTRIL) 40 MG tablet   Other Relevant Orders   Lipid panel   Comprehensive metabolic panel   Essential hypertension   Relevant Medications   amLODipine (NORVASC) 10 MG tablet   lisinopril (PRINIVIL,ZESTRIL) 40 MG tablet   Other Relevant Orders   Lipid panel   Hemoglobin A1c   Comprehensive metabolic panel    Other Visit Diagnoses    DM (diabetes mellitus) type II uncontrolled, periph vascular disorder ([place])    -  Primary   Relevant Medications   amLODipine (NORVASC) 10 MG tablet   lisinopril (PRINIVIL,ZESTRIL) 40 MG tablet   insulin aspart protamine - aspart (NOVOLOG 70/30 MIX) (70-30) 100 UNIT/ML FlexPen   Other Relevant Orders   Ambulatory referral to Endocrinology   Lipid panel   Hemoglobin A1c  I have discontinued Ms. Dimiceli's insulin NPH-regular Human. I have also changed her lisinopril. Additionally, I am having her start on insulin aspart protamine - aspart. Lastly, I am having her maintain her ACCU-CHEK GUIDE, ACCU-CHEK FASTCLIX LANCETS, glucose blood, aspirin, levocetirizine, prednisoLONE acetate, misoprostol,  atorvastatin, and amLODipine.  Meds ordered this encounter  Medications  . amLODipine (NORVASC) 10 MG tablet    Sig: Take 1 tablet (10 mg total) by mouth daily.    Dispense:  90 tablet    Refill:  3  . lisinopril (PRINIVIL,ZESTRIL) 40 MG tablet    Sig: Take 1 tablet (40 mg total) by mouth daily.    Dispense:  90 tablet    Refill:  3  . insulin aspart protamine - aspart (NOVOLOG 70/30 MIX) (70-30) 100 UNIT/ML FlexPen    Sig: 20 u sq q am and 10u sq qpm    Dispense:  15 mL    Refill:  11    CMA served as scribe during this visit. History, Physical and Plan performed by medical provider. Documentation and orders reviewed and attested to.  Ann Held, DO

## 2016-07-08 NOTE — Patient Instructions (Signed)

## 2016-07-10 ENCOUNTER — Ambulatory Visit: Payer: Self-pay | Admitting: Obstetrics & Gynecology

## 2016-07-11 ENCOUNTER — Other Ambulatory Visit: Payer: Self-pay | Admitting: Family Medicine

## 2016-07-11 DIAGNOSIS — N289 Disorder of kidney and ureter, unspecified: Secondary | ICD-10-CM

## 2016-07-12 ENCOUNTER — Telehealth: Payer: Self-pay | Admitting: Family Medicine

## 2016-07-12 MED ORDER — INSULIN PEN NEEDLE 31G X 5 MM MISC: 2 refills | Status: DC

## 2016-07-12 NOTE — Telephone Encounter (Signed)
Relation to WU:JWJXpt:self Call back [phone] Pharmacy:  [facility] - [place] [address]  Reason for call:  Patient requesting pen needles for her NOVOLOG, please advise

## 2016-07-12 NOTE — Telephone Encounter (Signed)
Sent in pen needles 

## 2016-07-19 ENCOUNTER — Ambulatory Visit: Payer: Medicaid Other | Admitting: Obstetrics & Gynecology

## 2016-07-19 ENCOUNTER — Encounter: Payer: Self-pay | Admitting: Obstetrics & Gynecology

## 2016-07-19 VITALS — BP 151/77 | HR 68 | Ht 70.0 in | Wt 184.0 lb

## 2016-07-19 DIAGNOSIS — N938 Other specified abnormal uterine and vaginal bleeding: Secondary | ICD-10-CM

## 2016-07-19 DIAGNOSIS — Z30430 Encounter for insertion of intrauterine contraceptive device: Secondary | ICD-10-CM

## 2016-07-19 NOTE — Progress Notes (Signed)
   Subjective:    Patient ID: Stephanie Lloyd, female    DOB: 04/07/1970, 46 y.o.   MRN: [id number]  HPI 46 yo M AA P1 here for Liletta insertion for management of her DUB. Her EMBX was negative.   Review of Systems     Objective:   Physical Exam  WNWHBFNAD UPT negative, consent signed, Time out procedure done. Cervix prepped with betadine and grasped with a single tooth tenaculum. Liletta was easily placed and the strings were cut to 3-4 cm. Uterus sounded to 8 cm. Transvaginal bedside u/s confirmed correct placement She tolerated the procedure well.     Assessment & Plan:  DUB- Liletta RTC 4 weeks for string check

## 2016-07-24 ENCOUNTER — Institutional Professional Consult (permissible substitution): Payer: Medicaid Other | Admitting: Neurology

## 2016-08-02 ENCOUNTER — Encounter: Payer: Self-pay | Admitting: Family Medicine

## 2016-08-02 ENCOUNTER — Ambulatory Visit (INDEPENDENT_AMBULATORY_CARE_PROVIDER_SITE_OTHER): Payer: Medicaid Other | Admitting: Family Medicine

## 2016-08-02 ENCOUNTER — Ambulatory Visit: Payer: Self-pay

## 2016-08-02 VITALS — BP 112/70 | HR 63 | Ht 70.0 in | Wt 184.0 lb

## 2016-08-02 DIAGNOSIS — I639 Cerebral infarction, unspecified: Secondary | ICD-10-CM

## 2016-08-02 DIAGNOSIS — M7501 Adhesive capsulitis of right shoulder: Secondary | ICD-10-CM | POA: Diagnosis not present

## 2016-08-02 DIAGNOSIS — M25511 Pain in right shoulder: Secondary | ICD-10-CM | POA: Diagnosis not present

## 2016-08-02 DIAGNOSIS — M7500 Adhesive capsulitis of unspecified shoulder: Secondary | ICD-10-CM

## 2016-08-02 DIAGNOSIS — E11618 Type 2 diabetes mellitus with other diabetic arthropathy: Secondary | ICD-10-CM | POA: Diagnosis not present

## 2016-08-02 MED ORDER — VITAMIN D (ERGOCALCIFEROL) 1.25 MG (50000 UNIT) PO CAPS: 50000.0000 [IU] | ORAL_CAPSULE | ORAL | 0 refills | Status: DC

## 2016-08-02 NOTE — Assessment & Plan Note (Signed)
Right-sided. It appears the patient rotator cuff is intact in the ultrasound today. We discussed with patient about once weekly vitamin D, icing regimen, the importance of controlling patient's blood sugars. We'll be sent to physical therapy. We discussed topical anti-inflammatories for pain relief. In addition of this I do feel that patient does have some weakness that is likely secondary to patient's stroke of the extremity as well. Patient will work on this strength in physical therapy but discussed that it may not completely come back. Follow-up again in 4 weeks.

## 2016-08-02 NOTE — Patient Instructions (Signed)
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  Once weekly vitamin D for 12 weeks.  Exercises 3 times a week.  PT will be calling you  Over the counter.... Turmeric 500mg  twice daily  pennsaid pinkie amount topically 2 times daily as needed.  See me again in 4 weeks.

## 2016-08-02 NOTE — Assessment & Plan Note (Signed)
Unfortunately ablate some of the stroke is causing some of the weakness. Discussed with patient at great length. Patient is understanding. Will try some over-the-counter medications for any type of possible nerve stimulation. We discussed formal physical therapy to help a strength. Patient will continue to be active. Continue all the other medications including her statin.

## 2016-08-02 NOTE — Progress Notes (Signed)
Stephanie Lloyd D.O. [facility] [address] [place], [place] [address] Phone: [phone] Subjective:    I'm seeing this patient by the request  of:    CC: Right arm pain  BJY:[id number]:Subjective  Stephanie Lloyd is a 46 y.o. female coming in with complaint of right hand pain and weakness. States that this pain is been going on for 3 months. Does not remember any true injury but unfortunately did have a cerebral vascular accident and has multiple ones previously. Patient was admitted September 2017 as well as then again in 02/28/2016 and since then unfortunately is having worsening weakness of the right side. At that time patient did have an MRI that showed a left. Median pontine infarct As well as significant ischemic changes. Patient states unfortunately this is starting to affect daily activities. Has noticed some decreasing range of motion. Rates the severity pain is 6 out of 10 and can wake her up at night. Avoiding medications secondary to her other comorbidities.    Past Medical History:  Diagnosis Date  . Diabetes ([facility])   . Diabetes mellitus without complication ([facility])   . Diastolic dysfunction   . Hypertension   . Immune deficiency disorder ([facility])   . Menometrorrhagia 04/01/2016  . Stroke [facility])    Past Surgical History:  Procedure Laterality Date  . NO PAST SURGERIES     Social History   Social History  . Marital status: Married    Spouse name: N/A  . Number of children: N/A  . Years of education: N/A   Social History Main Topics  . Smoking status: Never Smoker  . Smokeless tobacco: Never Used  . Alcohol use No     Comment: occ  . Drug use: No  . Sexual activity: Yes    Birth control/ protection: None   Other Topics Concern  . None   Social History Narrative   ** Merged History Encounter **       Allergies  Allergen Reactions  . Metformin And Related Diarrhea   Family History  Problem Relation Age of Onset  . Diabetes Father   . Hypertension Father    . Stroke Father   . Diabetes Paternal Uncle   . Stroke Paternal Uncle   . Stroke Brother   . Cancer Neg Hx   . Breast cancer Neg Hx   . Ovarian cancer Neg Hx     Past medical history, social, surgical and family history all reviewed in electronic medical record.  No pertanent information unless stated regarding to the chief complaint.   Review of Systems:Review of systems updated and as accurate as of 08/02/16  No visual changes, nausea, vomiting, diarrhea, constipation, dizziness, abdominal pain, skin rash, fevers, chills, night sweats, weight loss, swollen lymph nodes, body aches, joint swelling, muscle aches, chest pain, shortness of breath, mood changes. Positive headaches  Objective  Blood pressure 112/70, pulse 63, height 5\' 10"  (1.778 m), weight 184 lb (83.5 kg), SpO2 99 %. Systems examined below as of 08/02/16   General: No apparent distress alert and oriented x3 mood and affect normal, dressed appropriately. Does have some weakness on the right side of her body. Slurred speech noted. HEENT: Pupils equal, extraocular movements intact  Respiratory: Patient's speak in full sentences and does not appear short of breath  Cardiovascular: No lower extremity edema, non tender, no erythema  Skin: Warm dry intact with no signs of infection or rash on extremities or on axial skeleton.  Abdomen: Soft nontender  Neuro: Cranial nerves II through XII are intact, neurovascularly intact in all extremities with 2+ DTRs and 2+ pulses.  Lymph: No lymphadenopathy of posterior or anterior cervical chain or axillae bilaterally.  Gait antalgic gait MSK:  Non tender with full range of motion and good stability and strength and tone of  elbows, wrist, hip, knee and ankles bilaterally. Patient does have some weakness of the right upper extremity Shoulder: Right Inspection reveals no abnormalities, atrophy or asymmetry. Palpation is normal with no tenderness over AC joint or bicipital  groove. Significant decrease in range of motion lacking the last 5 of extension, lacks all but 5 of external rotation and does have internal rotation to lateral hip. Rotator cuff strength 4 out of 5 compared to contralateral sign signs of impingement with positive Neer and Hawkin's tests, but negative empty can sign. Speeds and Yergason's tests normal. No labral pathology noted with negative Obrien's, negative clunk and good stability. Normal scapular function observed. Weakness noted. No apprehension sign  MSK US performed of: Right This study was ordered, performed, and interpreted by Terrilee Files D.O.  Shoulder:   Supraspinatus:  Appears normal on long and transverse views, Bursal bulge seen with shoulder abduction on impingement view. Severe thickening of the anterior capsule Infraspinatus:  Appears normal on long and transverse views. Significant increase in Doppler flow severe thickening of the posterior capsule Subscapularis:  Appears normal on long and transverse views. Positive bursa Teres Minor:  Appears normal on long and transverse views. AC joint:  Capsule undistended, no geyser sign. Glenohumeral Joint:  Appears normal without effusion. Glenoid Labrum:  Intact without visualized tears. Biceps Tendon:  Appears normal on long and transverse views, no fraying of tendon, tendon located in intertubercular groove, no subluxation with shoulder internal or external rotation.  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    Impression and Recommendations:     This case required medical decision making of moderate complexity.      Note: This dictation was prepared with Dragon dictation along with smaller phrase technology. Any transcriptional errors that result from this process are unintentional.

## 2016-08-05 ENCOUNTER — Emergency Department (HOSPITAL_COMMUNITY)
Admission: EM | Admit: 2016-08-05 | Discharge: 2016-08-05 | Disposition: A | Discharge status: Home / Self Care | Payer: Medicaid Other | Attending: Emergency Medicine | Admitting: Emergency Medicine

## 2016-08-05 ENCOUNTER — Encounter (HOSPITAL_COMMUNITY): Payer: Self-pay | Admitting: Emergency Medicine

## 2016-08-05 DIAGNOSIS — Z794 Long term (current) use of insulin: Secondary | ICD-10-CM | POA: Insufficient documentation

## 2016-08-05 DIAGNOSIS — E162 Hypoglycemia, unspecified: Secondary | ICD-10-CM | POA: Insufficient documentation

## 2016-08-05 DIAGNOSIS — T383X5A Adverse effect of insulin and oral hypoglycemic [antidiabetic] drugs, initial encounter: Secondary | ICD-10-CM | POA: Insufficient documentation

## 2016-08-05 DIAGNOSIS — I10 Essential (primary) hypertension: Secondary | ICD-10-CM | POA: Insufficient documentation

## 2016-08-05 DIAGNOSIS — Z7982 Long term (current) use of aspirin: Secondary | ICD-10-CM | POA: Diagnosis not present

## 2016-08-05 DIAGNOSIS — Z79899 Other long term (current) drug therapy: Secondary | ICD-10-CM | POA: Insufficient documentation

## 2016-08-05 DIAGNOSIS — I629 Nontraumatic intracranial hemorrhage, unspecified: Secondary | ICD-10-CM | POA: Diagnosis not present

## 2016-08-05 DIAGNOSIS — R410 Disorientation, unspecified: Secondary | ICD-10-CM | POA: Diagnosis present

## 2016-08-05 DIAGNOSIS — E1159 Type 2 diabetes mellitus with other circulatory complications: Secondary | ICD-10-CM | POA: Diagnosis not present

## 2016-08-05 DIAGNOSIS — E160 Drug-induced hypoglycemia without coma: Secondary | ICD-10-CM

## 2016-08-05 LAB — CBC WITH DIFFERENTIAL/PLATELET
BASOS PCT: 0 %
Basophils Absolute: 0 10*3/uL (ref 0.0–0.1)
Eosinophils Absolute: 0.1 10*3/uL (ref 0.0–0.7)
Eosinophils Relative: 1 %
HEMATOCRIT: 42.9 % (ref 36.0–46.0)
HEMOGLOBIN: 13.8 g/dL (ref 12.0–15.0)
LYMPHS PCT: 16 %
Lymphs Abs: 1.6 10*3/uL (ref 0.7–4.0)
MCH: 27.7 pg (ref 26.0–34.0)
MCHC: 32.2 g/dL (ref 30.0–36.0)
MCV: 86.1 fL (ref 78.0–100.0)
MONOS PCT: 3 %
Monocytes Absolute: 0.3 10*3/uL (ref 0.1–1.0)
NEUTROS ABS: 7.9 10*3/uL — AB (ref 1.7–7.7)
NEUTROS PCT: 80 %
Platelets: 248 10*3/uL (ref 150–400)
RBC: 4.98 MIL/uL (ref 3.87–5.11)
RDW: 13.2 % (ref 11.5–15.5)
WBC: 9.8 10*3/uL (ref 4.0–10.5)

## 2016-08-05 LAB — CBG MONITORING, ED
Glucose-Capillary: 18 mg/dL — CL (ref 65–99)
Glucose-Capillary: 65 mg/dL (ref 65–99)
Glucose-Capillary: 157 mg/dL — ABNORMAL HIGH (ref 65–99)
Glucose-Capillary: 272 mg/dL — ABNORMAL HIGH (ref 65–99)
Glucose-Capillary: 224 mg/dL — ABNORMAL HIGH (ref 65–99)

## 2016-08-05 LAB — COMPREHENSIVE METABOLIC PANEL
ALBUMIN: 4.3 g/dL (ref 3.5–5.0)
ALK PHOS: 81 U/L (ref 38–126)
ALT: 21 U/L (ref 14–54)
ANION GAP: 8 (ref 5–15)
AST: 24 U/L (ref 15–41)
BILIRUBIN TOTAL: 0.3 mg/dL (ref 0.3–1.2)
BUN: 19 mg/dL (ref 6–20)
CALCIUM: 9.4 mg/dL (ref 8.9–10.3)
CO2: 27 mmol/L (ref 22–32)
Chloride: 107 mmol/L (ref 101–111)
Creatinine, Ser: 1.26 mg/dL — ABNORMAL HIGH (ref 0.44–1.00)
GFR calc Af Amer: 57 mL/min — ABNORMAL LOW (ref >60)
GFR calc non Af Amer: 50 mL/min — ABNORMAL LOW (ref >60)
GLUCOSE: 50 mg/dL — AB (ref 65–99)
Potassium: 3.7 mmol/L (ref 3.5–5.1)
Sodium: 142 mmol/L (ref 135–145)
TOTAL PROTEIN: 7.8 g/dL (ref 6.5–8.1)

## 2016-08-05 MED ORDER — DEXTROSE 50 % IV SOLN
1.0000 | Freq: Once | INTRAVENOUS | Status: AC
  Administered 2016-08-05: 50 mL via INTRAVENOUS
  Filled 2016-08-05: qty 50

## 2016-08-05 NOTE — ED Triage Notes (Signed)
Spouse reported that pt. Is confused yesterday , CBG = 18 at arrival , alert and oriented x4 at arrival , speech clear with no facial asymmetry . Pt. given orange juice and graham crackers at triage .

## 2016-08-05 NOTE — ED Notes (Addendum)
cbg was 224 

## 2016-08-05 NOTE — ED Notes (Signed)
Pt given a Mal[place]urkey sandwich, apple sauce, graham crackers and peanut butter. Pt also given orange juice with 2 packs of sugar stirred in

## 2016-08-05 NOTE — ED Notes (Signed)
cbg was 272

## 2016-08-05 NOTE — ED Notes (Signed)
Checked pt CBG 65 RN Bobby informed.

## 2016-08-05 NOTE — ED Notes (Addendum)
Pt notified of need for urine sample, pt placed on bedpan and asked for privacy.

## 2016-08-05 NOTE — ED Provider Notes (Signed)
[facility] DEPT Provider Note   CSN: [id number] Arrival date & time: 08/05/16  0555     History   Chief Complaint Chief Complaint  Patient presents with  . Confused    Hypoglycemia    HPI Stephanie Lloyd is a 46 y.o. female with h/o HTN, insulin dependent T2DM, ICH, lacunar infarcts, extensive microbleds and significant white matter ischemic changes presents to ED for evaluation of confusion, that has occurred twice in the last 24 hours.  At 15 yesterday patient felt groggy while doing laundry, she checked her BG and it was 50, she had a snack and felt better.  At 0430 today, patient woke up to use the bathroom and her husband noticed her speech was slowed and she looked "dazed", patient eventually fell back asleep but when waking up again she was still not back to baseline and patient came to ED for further evaluation.   Two weeks ago, patient started using new insulin pen (Flexpen BID, 20 units/10 units). She attributes her symptoms today to her low blood sugar. Since starting new Flexpen, has noticed her blood sugar levels dropping to 50s. She admits to not eating a good breakfast in the mornings, and only eats a small snack. Denies falls, head trauma, anticoagulant use, new or worsening dysarthria, diplopia, focal weakness or numbness, facial asymmetry, headache. Husband who witnessed both episodes notes patient is back to baseline now.   HPI  Past Medical History:  Diagnosis Date  . Diabetes ([place])   . Diabetes mellitus without complication ([place])   . Diastolic dysfunction   . Hypertension   . Immune deficiency disorder ([place])   . Menometrorrhagia 04/01/2016  . Stroke [facility])     Patient Active Problem List   Diagnosis Date Noted  . 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 OSA (obstructive sleep apnea) 06/10/2016  . Iron deficiency anemia due to chronic blood loss 04/01/2016  . Menometrorrhagia 04/01/2016  . Upper respiratory  infection 03/25/2016  . History of stroke   . History of intracerebral hemorrhage without residual deficit   . Cerebrovascular accident (CVA) due to thrombosis of basilar artery ([place]) 02/28/2016  . Ischemic stroke ([place]) 02/28/2016  . Acute kidney injury ([place]) 10/28/2015  . Acute encephalopathy 10/28/2015  . Diastolic dysfunction   . Stroke (cerebrum) ([place])   . Focal infarction of brain ([place]) 10/08/2015  . Syncope 08/19/2015  . Hypertensive emergency 07/29/2015  . Hypertensive urgency 07/28/2015  . Hypokalemia 07/28/2015  . Accelerated hypertension 09/21/2014  . Type 2 diabetes mellitus with other circulatory complications ([place]) 27/74/1287  . Hyperlipidemia 09/21/2014  . Thalamic hemorrhage with stroke ([place]) 07/29/2014  . Hyperlipidemia LDL goal <70 07/29/2014  . Hemorrhagic stroke ([place]) 06/22/2014  . ICH (intracerebral hemorrhage) ([place]) 06/22/2014  . Essential hypertension 05/13/2012  . Diabetes mellitus ([place]) 05/13/2012    Past Surgical History:  Procedure Laterality Date  . NO PAST SURGERIES      OB History    Gravida Para Term Preterm AB Living   '2 1 1 ' 0 1 1   SAB TAB Ectopic Multiple Live Births   0 0 0           Home Medications    Prior to Admission medications   Medication Sig Start Date End Date Taking? Authorizing Provider  ACCU-CHEK FASTCLIX LANCETS MISC Check blood sugar twice daily. Dx E:11.9 Patient taking differently: Check blood sugar twice daily. Dx E:11.9 09/08/15   Ann Held, DO  amLODipine (NORVASC) 10 MG tablet Take 1 tablet (10 mg total) by mouth daily. 07/08/16   Ann Held, DO  aspirin EC 325 MG EC tablet Take 1 tablet (325 mg total) by mouth daily. 03/02/16   Steve Rattler, DO  atorvastatin (LIPITOR) 80 MG tablet Take 1 tablet (80 mg total) by mouth daily at 6 PM. 06/10/16   Rosalin Hawking, MD  Blood Glucose Monitoring Suppl (ACCU-CHEK GUIDE) w/Device KIT 1 Device by Does not apply route as directed. Check blood sugar daily  09/08/15   Carollee Herter, Kendrick Fries R, DO  glucose blood (ACCU-CHEK GUIDE) test strip Check blood sugar twice daily. Dx E:11.9 09/08/15   Carollee Herter, Kendrick Fries R, DO  insulin aspart protamine - aspart (NOVOLOG 70/30 MIX) (70-30) 100 UNIT/ML FlexPen 20 u sq q am and 10u sq qpm 07/08/16   Carollee Herter, Alferd Apa, DO  Insulin Pen Needle (B-D UF III MINI PEN NEEDLES) 31G X 5 MM MISC To use with insulin 07/12/16   Carollee Herter, Alferd Apa, DO  lisinopril (PRINIVIL,ZESTRIL) 40 MG tablet Take 1 tablet (40 mg total) by mouth daily. 07/08/16   Ann Held, DO  prednisoLONE acetate (PRED FORTE) 1 % ophthalmic suspension Place 1 drop into both eyes 2 days.    [provider]  Vitamin D, Ergocalciferol, (DRISDOL) 50000 units CAPS capsule Take 1 capsule (50,000 Units total) by mouth every 7 (seven) days. 08/02/16   Lyndal Pulley, DO    Family History Family History  Problem Relation Age of Onset  . Diabetes Father   . Hypertension Father   . Stroke Father   . Diabetes Paternal Uncle   . Stroke Paternal Uncle   . Stroke Brother   . Cancer Neg Hx   . Breast cancer Neg Hx   . Ovarian cancer Neg Hx     Social History Social History  Substance Use Topics  . Smoking status: Never Smoker  . Smokeless tobacco: Never Used  . Alcohol use No     Comment: occ     Allergies   Metformin and related   Review of Systems Review of Systems  Constitutional: Negative for fever.  HENT: Negative for congestion and sore throat.   Eyes: Negative for visual disturbance.  Respiratory: Negative for cough and shortness of breath.   Cardiovascular: Negative for chest pain.  Gastrointestinal: Negative for abdominal pain, constipation, diarrhea, nausea and vomiting.  Endocrine: Negative for polydipsia and polyuria.  Genitourinary: Negative for decreased urine volume, difficulty urinating and dysuria.  Musculoskeletal: Negative for arthralgias.  Skin: Negative for color change.  Allergic/Immunologic: Positive  for immunocompromised state.  Neurological: Negative for syncope, facial asymmetry, speech difficulty, weakness, light-headedness, numbness and headaches.  Psychiatric/Behavioral: Positive for confusion.     Physical Exam Updated Vital Signs BP (!) 150/82 (BP Location: Right Arm)   Pulse (!) 56   Temp 97.9 F (36.6 C) (Oral)   Resp 12   SpO2 100%   Physical Exam  Constitutional: She is oriented to person, place, and time. She appears well-developed and well-nourished. No distress.  HENT:  Head: Normocephalic and atraumatic.  Nose: Nose normal.  Mouth/Throat: Oropharynx is clear and moist. No oropharyngeal exudate.  Eyes: Conjunctivae are normal.  PERRL and EOMs intact bilaterally   Neck: Normal range of motion. Neck supple.  Full neck ROM without rigidity or pain   Cardiovascular: Normal rate, regular rhythm, normal heart sounds and intact distal pulses.   No murmur heard. Pulmonary/Chest: Effort  normal and breath sounds normal. No respiratory distress. She has no wheezes. She has no rales. She exhibits no tenderness.  Abdominal: Soft. Bowel sounds are normal. There is no tenderness.  Musculoskeletal: Normal range of motion. She exhibits no deformity.  Lymphadenopathy:    She has no cervical adenopathy.  Neurological: She is alert and oriented to person, place, and time. No sensory deficit.  A&Ox3. Mild dysarthria (baseline)  Strength 5/5 in upper and lower extremities.   Sensation to light touch intact in upper and lower extremities.  No leg drift.  Intact finger to nose test. CN I not tested. CN II - CN XII intact bilaterally.   Skin: Skin is warm and dry. Capillary refill takes less than 2 seconds.  Psychiatric: She has a normal mood and affect. Her behavior is normal. Judgment and thought content normal.  Nursing note and vitals reviewed.    ED Treatments / Results  Labs (all labs ordered are listed, but only abnormal results are displayed) Labs Reviewed  CBC WITH  DIFFERENTIAL/PLATELET - Abnormal; Notable for the following:       Result Value   Neutro Abs 7.9 (*)    All other components within normal limits  COMPREHENSIVE METABOLIC PANEL - Abnormal; Notable for the following:    Glucose, Bld 50 (*)    Creatinine, Ser 1.26 (*)    GFR calc non Af Amer 50 (*)    GFR calc Af Amer 57 (*)    All other components within normal limits  CBG MONITORING, ED - Abnormal; Notable for the following:    Glucose-Capillary 18 (*)    All other components within normal limits  CBG MONITORING, ED - Abnormal; Notable for the following:    Glucose-Capillary 157 (*)    All other components within normal limits  CBG MONITORING, ED - Abnormal; Notable for the following:    Glucose-Capillary 272 (*)    All other components within normal limits  CBG MONITORING, ED - Abnormal; Notable for the following:    Glucose-Capillary 224 (*)    All other components within normal limits  CBG MONITORING, ED    EKG  EKG Interpretation  Date/Time:  Monday August 05 2016 06:18:45 EDT Ventricular Rate:  64 PR Interval:  154 QRS Duration: 98 QT Interval:  452 QTC Calculation: 466 R Axis:   38 Text Interpretation:  Normal sinus rhythm Nonspecific ST and T wave abnormality Prolonged QT Abnormal ECG No significant change since last tracing Confirmed by Ward, Cyril Mourning [phone]) on 08/05/2016 6:40:17 AM       Radiology No results found.  Procedures Procedures (including critical care time)  Medications Ordered in ED Medications  dextrose 50 % solution 50 mL (50 mLs Intravenous Given 08/05/16 0350)     Initial Impression / Assessment and Plan / ED Course  I have reviewed the triage vital signs and the nursing notes.  Pertinent labs & imaging results that were available during my care of the patient were reviewed by me and considered in my medical decision making (see chart for details).    46 yo female with h/o HTN, insulin dependent T2DM, ICH, lacunar infarcts presents to ED  for evaluation of confusion. History suggestive of insulin induced hypoglycemia. Patient has noted frequent low CBG readings since recent insulin regimen change two weeks ago. Both episodes of "grogginess", slowed speech and "dazed" resolved after eating. Patient has baseline mild dysarthria, but not new neurological deficits. Patient was given PO and dextrose IV in ED, her CBGs  have seen improved from initial 18 to 224. Given h/o strokes and ICH, considered central cause of symptoms however HPI and physical exam not consistent with this.  Doubt sepsis causing confusion, no fevers, URI or UTI symptoms. No leukocytosis. Patient has scheduled appointment with PCP tomorrow. She is considered safe for discharge at this time with strict ED return precautions.   Patient, ED treatment and discharge plan was discussed with supervising physician who is agreeable with plan.  Final Clinical Impressions(s) / ED Diagnoses   Final diagnoses:  Hypoglycemia due to insulin    New Prescriptions New Prescriptions   No medications on file       Arlean Hopping 08/05/16 7182    Gareth Morgan, MD 08/07/16 1300

## 2016-08-05 NOTE — Discharge Instructions (Signed)
I suspect your symptoms today were from low blood sugar levels. It is very important that you administer your insulin as instructed and that you eat a substantial meal after insulin to avoid low blood sugar levels.   Please go to your scheduled appointment with your doctor tomorrow. Discuss your recent low blood sugar readings.   Return to ED for worsening symptoms, slurred speech, one sided weakness or numbness, or any other symptoms of stroke

## 2016-08-06 ENCOUNTER — Ambulatory Visit (INDEPENDENT_AMBULATORY_CARE_PROVIDER_SITE_OTHER): Payer: Medicaid Other | Admitting: Family Medicine

## 2016-08-06 VITALS — BP 132/87 | HR 73

## 2016-08-06 DIAGNOSIS — I10 Essential (primary) hypertension: Secondary | ICD-10-CM

## 2016-08-06 NOTE — Progress Notes (Signed)
Pre visit review using our clinic review tool, if applicable. No additional management support is needed unless otherwise documented below in the visit note.  Patient presents in office for blood pressure check. Verbal order given by Dr. Zola ButtonLowne Lloyd. Patient voices compliance with medications & regimen. She denies chest pain, headaches, dizziness & etc. RN obtained the following readings during today's visit: BP 132/87 P 73 & O2 97%.  Per Dr. Zola ButtonLowne Lloyd: Continue taking current medications & regimen. Follow-up with Endocrinology. Return in 2-3 months for an office visit with PCP.  Informed patient of the provider's recommendations. She verbalized understanding. Next appointment scheduled for 11/07/16 at 10:15 AM.  Agree.  Donato SchultzYvonne R [name], DO

## 2016-08-06 NOTE — Patient Instructions (Signed)
Per Dr. Zola ButtonLowne Chase: Continue taking current medications & regimen. Follow-up with Endocrinology. Return in 2-3 months for an office visit with PCP.

## 2016-08-07 ENCOUNTER — Encounter: Payer: Self-pay | Admitting: Family Medicine

## 2016-08-16 ENCOUNTER — Encounter: Payer: Self-pay | Admitting: Obstetrics & Gynecology

## 2016-08-16 ENCOUNTER — Ambulatory Visit (INDEPENDENT_AMBULATORY_CARE_PROVIDER_SITE_OTHER): Payer: Medicaid Other | Admitting: Obstetrics & Gynecology

## 2016-08-16 VITALS — BP 122/80 | HR 68 | Ht 70.0 in | Wt 182.0 lb

## 2016-08-16 DIAGNOSIS — A630 Anogenital (venereal) warts: Secondary | ICD-10-CM

## 2016-08-16 DIAGNOSIS — Z30431 Encounter for routine checking of intrauterine contraceptive device: Secondary | ICD-10-CM | POA: Diagnosis not present

## 2016-08-16 MED ORDER — IMIQUIMOD 5 % EX CREA: TOPICAL_CREAM | CUTANEOUS | 5 refills | Status: DC

## 2016-08-16 NOTE — Progress Notes (Signed)
History:  46 y.o. G2P1011 here today for today for IUD string check; Mirena IUD was placed 07/19/2016. No complaints about the Mirena, no concerning side effects.  The following portions of the patient's history were reviewed and updated as appropriate: allergies, current medications, past family history, past medical history, past social history, past surgical history and problem list. Last pap smear on 05/20/2016 was normal, neg HRHPV.  Review of Systems:  Pertinent items are noted in HPI.   Objective:  Physical Exam Blood pressure 122/80, pulse 68, height 5\' 10"  (1.778 m), weight 182 lb (82.6 kg). Gen: NAD Abd: Soft, nontender and nondistended Pelvic: external genitalia on the right labia reveals genital warts; normal appearing vaginal mucosa and cervix.  IUD strings visualized, about 3 cm in length outside cervix.   Assessment & Plan:  Normal IUD check. Patient to keep IUD in place for five years; can come in for removal if she desires pregnancy within the next five years. Routine preventative health maintenance measures emphasized.  Genital warts  [place]   Total face-to-face time with patient was 10 min.  Greater than 50% was spent in counseling and coordination of care with the patient.   Harper Smoker L. Harraway-Smith, M.D., Evern CoreFACOG

## 2016-08-16 NOTE — Patient Instructions (Signed)

## 2016-08-16 NOTE — Progress Notes (Signed)
Patient states she has had some spotting since insertion. Armandina StammerJennifer Canaan Holzer RNBSN

## 2016-08-19 ENCOUNTER — Ambulatory Visit: Payer: Self-pay | Admitting: Family Medicine

## 2016-08-19 ENCOUNTER — Ambulatory Visit: Payer: Medicaid Other | Attending: Family Medicine | Admitting: Physical Therapy

## 2016-08-19 ENCOUNTER — Encounter: Payer: Self-pay | Admitting: Physical Therapy

## 2016-08-19 DIAGNOSIS — I69351 Hemiplegia and hemiparesis following cerebral infarction affecting right dominant side: Secondary | ICD-10-CM | POA: Insufficient documentation

## 2016-08-19 DIAGNOSIS — R531 Weakness: Secondary | ICD-10-CM

## 2016-08-19 DIAGNOSIS — G8929 Other chronic pain: Secondary | ICD-10-CM | POA: Diagnosis present

## 2016-08-19 DIAGNOSIS — M25511 Pain in right shoulder: Secondary | ICD-10-CM | POA: Insufficient documentation

## 2016-08-19 DIAGNOSIS — M7500 Adhesive capsulitis of unspecified shoulder: Secondary | ICD-10-CM | POA: Diagnosis present

## 2016-08-19 DIAGNOSIS — E11618 Type 2 diabetes mellitus with other diabetic arthropathy: Secondary | ICD-10-CM | POA: Diagnosis present

## 2016-08-19 NOTE — Patient Instructions (Signed)
   Lean forward with arm outstretched on the table, pushing arm straight foward Hold 10 seconds Repeat 10-15 times Perform 3 times a day    Place arm at a diagonal Lean forward allowing arm to slide out the the side at an angle Hold 10 seconds Repeat 10-15 times Perform 3 times each day   Wall Slides: Stand facing the wall Slide right arm up and down the wall holding 5 seconds each  Repeat 10 -15 times Perform 3 times a day      Reach your arm out to the side Slide arm out across the table Hold 10 seconds Repeat 10-15 times Perform 3 times each day    Lye on your back with a towel or pillow under your elbow Keep you elbow bent at 90 degrees (make an "L" with your elbow) Push right arm outward Hold 5 seconds Repeat 10-15 times Perform 3 times a day

## 2016-08-20 NOTE — Therapy (Signed)
[facility] [address] [place], [place], [address] Phone: [phone]   Fax:  [phone]  Physical Therapy Evaluation  Patient Details  Name: Stephanie Lloyd MRN: [id number] Date of Birth: 05-21-1970 Referring Provider: Antoine Primas, DO  Encounter Date: 08/19/2016      PT End of Session - 08/19/16 1035    Visit Number 1   Number of Visits 1   Date for PT Re-Evaluation 10/20/16   Authorization Type MCD, one visit only   PT Start Time 1020   PT Stop Time 1100   PT Time Calculation (min) 40 min   Activity Tolerance Patient tolerated treatment well   Behavior During Therapy [facility] for tasks assessed/performed      Past Medical History:  Diagnosis Date  . Diabetes (HCC)   . Diabetes mellitus without complication (HCC)   . Diastolic dysfunction   . Hypertension   . Immune deficiency disorder (HCC)   . Menometrorrhagia 04/01/2016  . Stroke [facility])     Past Surgical History:  Procedure Laterality Date  . NO PAST SURGERIES      There were no vitals filed for this visit.       Subjective Assessment - 08/19/16 1031    Subjective Pt arriving to therapy today reporting her doctor stated she has frozen shoulder in her R UE. Pt with diagnosis of adhesive capsulitis of right shoulder.    Pertinent History Goes by "Stephanie Lloyd" (husband Harvie Heck), Pt with history of CVA with R sided hemiplegia   Limitations House hold activities;Lifting   Currently in Pain? No/denies  at rest, pain with lifting and reaching   Pain Score 4   lifting arm   Pain Location Shoulder   Pain Orientation Right   Pain Descriptors / Indicators Aching;Sore;Tightness   Pain Type Chronic pain   Pain Onset More than a month ago   Pain Frequency Intermittent   Aggravating Factors  lifting arm   Pain Relieving Factors resting, keeping it still            [facility] PT Assessment - 08/20/16 0001      ROM / Strength   AROM / PROM / Strength AROM;Strength      AROM   AROM Assessment Site Shoulder   Right/Left Shoulder Right   Right Shoulder Extension 12 Degrees   Right Shoulder Flexion 88 Degrees   Right Shoulder ABduction 40 Degrees   Right Shoulder Internal Rotation 25 Degrees   Right Shoulder External Rotation 15 Degrees     Strength   Overall Strength Deficits   Overall Strength Comments Due to limited ROM, pt's overall strength in R shoulder was rated as 2+/5, R elbow flexion and extension was 3+/5.   Strength Assessment Site Shoulder     Palpation   Palpation comment Pt reporting pain with palpation of right biceps tendon, anterior deltoid, supraspinatus and pectoralis.      Transfers   Transfers Sit to Stand   Sit to Stand 7: Independent   Five time sit to stand comments  13.8 seconds with Left UE support            Objective measurements completed on examination: See above findings.                  PT Education - 08/20/16 0945    Education provided Yes   Education Details HEP issued to pt, importance of mobility   Person(s) Educated Patient   Methods Explanation;Demonstration;Verbal cues;Handout   Comprehension Verbalized  understanding;Returned demonstration  needs further instruction, limited to one visit          PT Short Term Goals - 08/20/16 [id number]      PT SHORT TERM GOAL #1   Title HEP will be initiated for pt's R shoulder for increased mobility/ROM.    Baseline Issued HEP instruction during evaluation (08/19/16)           PT Long Term Goals - 06/10/16 1403      PT LONG TERM GOAL #1   Title Pt will be independent with handout with final HEP in order to indicate improved functional mobility.  (Target Date: following 8th visit after eval or by 06/23/16)   Baseline Pt is participating in informal walking program, will need to go over formal HEP in future visits.     Time 8   Period Weeks   Status Achieved     PT LONG TERM GOAL #2   Title Will perform FGA as able and set goal as  appropriate to indicate improved balance.     Baseline 16/30 on FGA on 05/02/16   Time 8   Period Weeks   Status Achieved     PT LONG TERM GOAL #3   Title Pt will improve gait speed to 2.19 ft/sec in order to indicate decreased fall risk and improved efficiency of gait.     Baseline 3.42 ft/sec without AD on 06/06/16   Time 8   Period Weeks   Status Achieved     PT LONG TERM GOAL #4   Title Pt will ambulate up to 500' over unlevel paved surfaces (including ramp/curb) w/ SPC or LRAD at S level (due to cognition) in order to improve community negotiation.     Baseline 500' over unlevel paved surfaces without AD at S level, would like to update to mod I level due to progress.    Time 8   Period Weeks   Status Achieved     PT LONG TERM GOAL #5   Title Pt will improve 6MWT by 300' from baseline in order to indicate improved functional endurance.     Baseline Pt went from 37679' at baseline to 58876' an increase of 197', and from 16876' to 1107' an increase of 231' (428' increase from baseline)   Time 8   Period Weeks   Status Achieved     PT LONG TERM GOAL #6   Title Pt will perform 5TSS in </=15 secs without UE support in order to indicate improved functional strength.     Baseline 12.60 secs without UE support on 06/06/16   Time 8   Period Weeks   Status Achieved     PT LONG TERM GOAL #7   Title Pt will improve FGA to >/= 19/30 on FGA in order to indicate decreased fall risk.    Baseline 24/30 on 06/10/16   Time 8   Period Weeks   Status Achieved                Plan - 08/20/16 0947    Clinical Impression Statement Pt is a 46 year old femal s/p CVA arriving to PT with R shoulder adhesive capsulitis. Pt reporting no pain at rest and 4/10 pain with lifting/reaching/abduction of her shoulder. Pt with weakness noted. Pt was instructed in HEP. Pt able to demonstrate exercises during session. Pt would benefit from further PT visits but is limited by financial restrictions.    Clinical  Presentation Stable   Clinical Decision Making  Low   Rehab Potential Good   PT Frequency --  1 visit, due to financial restrictions   PT Home Exercise Plan table flexion/abd/scaption, wall slides, cane ER   Consulted and Agree with Plan of Care Patient      Patient will benefit from skilled therapeutic intervention in order to improve the following deficits and impairments:  Decreased activity tolerance, Decreased mobility, Decreased strength, Impaired flexibility, Impaired UE functional use, Improper body mechanics, Postural dysfunction, Impaired perceived functional ability, Pain  Visit Diagnosis: Hemiplegia and hemiparesis following cerebral infarction affecting right dominant side (HCC)  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 OSA (obstructive sleep apnea) 06/10/2016  . Iron deficiency anemia due to chronic blood loss 04/01/2016  . Menometrorrhagia 04/01/2016  . Upper respiratory infection 03/25/2016  . History of stroke   . History of intracerebral hemorrhage without residual deficit   . Cerebrovascular accident (CVA) due to thrombosis of basilar artery (HCC) 02/28/2016  . Ischemic stroke (HCC) 02/28/2016  . Acute kidney injury (HCC) 10/28/2015  . Acute encephalopathy 10/28/2015  . Diastolic dysfunction   . Stroke (cerebrum) (HCC)   . Focal infarction of brain (HCC) 10/08/2015  . Syncope 08/19/2015  . Hypertensive emergency 07/29/2015  . Hypertensive urgency 07/28/2015  . Hypokalemia 07/28/2015  . Accelerated hypertension 09/21/2014  . Type 2 diabetes mellitus with other circulatory complications (HCC) 09/21/2014  . Hyperlipidemia 09/21/2014  . 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 Hyperlipidemia LDL goal <70  07/29/2014  . Hemorrhagic stroke (HCC) 06/22/2014  . ICH (intracerebral hemorrhage) (HCC) 06/22/2014  . Essential hypertension 05/13/2012  . Diabetes mellitus (HCC) 05/13/2012    Sharmon Leyden, MPT 08/20/2016, 10:02 AM  [facility] [address] [place], [place], [address] Phone: [phone]   Fax:  [phone]  Name: Stephanie Lloyd MRN: [id number] Date of Birth: 01-Feb-1970

## 2016-08-28 ENCOUNTER — Encounter (HOSPITAL_COMMUNITY): Payer: Self-pay | Admitting: *Deleted

## 2016-08-28 ENCOUNTER — Emergency Department (HOSPITAL_COMMUNITY)
Admission: EM | Admit: 2016-08-28 | Discharge: 2016-08-28 | Disposition: A | Discharge status: Home / Self Care | Payer: Medicaid Other | Attending: Emergency Medicine | Admitting: Emergency Medicine

## 2016-08-28 DIAGNOSIS — T887XXA Unspecified adverse effect of drug or medicament, initial encounter: Secondary | ICD-10-CM

## 2016-08-28 DIAGNOSIS — R531 Weakness: Secondary | ICD-10-CM | POA: Diagnosis present

## 2016-08-28 DIAGNOSIS — I10 Essential (primary) hypertension: Secondary | ICD-10-CM | POA: Diagnosis not present

## 2016-08-28 DIAGNOSIS — Z7982 Long term (current) use of aspirin: Secondary | ICD-10-CM | POA: Insufficient documentation

## 2016-08-28 DIAGNOSIS — E162 Hypoglycemia, unspecified: Secondary | ICD-10-CM

## 2016-08-28 DIAGNOSIS — Z794 Long term (current) use of insulin: Secondary | ICD-10-CM | POA: Diagnosis not present

## 2016-08-28 DIAGNOSIS — E11649 Type 2 diabetes mellitus with hypoglycemia without coma: Secondary | ICD-10-CM | POA: Diagnosis not present

## 2016-08-28 DIAGNOSIS — Z79899 Other long term (current) drug therapy: Secondary | ICD-10-CM | POA: Diagnosis not present

## 2016-08-28 DIAGNOSIS — Z8673 Personal history of transient ischemic attack (TIA), and cerebral infarction without residual deficits: Secondary | ICD-10-CM | POA: Insufficient documentation

## 2016-08-28 LAB — I-STAT CHEM 8, ED
BUN: 26 mg/dL — AB (ref 6–20)
CHLORIDE: 105 mmol/L (ref 101–111)
CREATININE: 1.2 mg/dL — AB (ref 0.44–1.00)
Calcium, Ion: 1.17 mmol/L (ref 1.15–1.40)
GLUCOSE: 191 mg/dL — AB (ref 65–99)
HEMATOCRIT: 44 % (ref 36.0–46.0)
HEMOGLOBIN: 15 g/dL (ref 12.0–15.0)
POTASSIUM: 3.9 mmol/L (ref 3.5–5.1)
Sodium: 142 mmol/L (ref 135–145)
TCO2: 25 mmol/L (ref 0–100)

## 2016-08-28 LAB — CBC WITH DIFFERENTIAL/PLATELET
BASOS PCT: 0 %
Basophils Absolute: 0 10*3/uL (ref 0.0–0.1)
Eosinophils Absolute: 0.1 10*3/uL (ref 0.0–0.7)
Eosinophils Relative: 1 %
HEMATOCRIT: 41.6 % (ref 36.0–46.0)
HEMOGLOBIN: 13.2 g/dL (ref 12.0–15.0)
LYMPHS PCT: 12 %
Lymphs Abs: 1.3 10*3/uL (ref 0.7–4.0)
MCH: 26.9 pg (ref 26.0–34.0)
MCHC: 31.7 g/dL (ref 30.0–36.0)
MCV: 84.7 fL (ref 78.0–100.0)
MONO ABS: 0.5 10*3/uL (ref 0.1–1.0)
Monocytes Relative: 4 %
NEUTROS ABS: 8.8 10*3/uL — AB (ref 1.7–7.7)
NEUTROS PCT: 83 %
Platelets: 236 10*3/uL (ref 150–400)
RBC: 4.91 MIL/uL (ref 3.87–5.11)
RDW: 12.9 % (ref 11.5–15.5)
WBC: 10.6 10*3/uL — ABNORMAL HIGH (ref 4.0–10.5)

## 2016-08-28 LAB — CBG MONITORING, ED
GLUCOSE-CAPILLARY: 171 mg/dL — AB (ref 65–99)
GLUCOSE-CAPILLARY: 207 mg/dL — AB (ref 65–99)
Glucose-Capillary: 37 mg/dL — CL (ref 65–99)
Glucose-Capillary: 124 mg/dL — ABNORMAL HIGH (ref 65–99)
Glucose-Capillary: 27 mg/dL — CL (ref 65–99)
Glucose-Capillary: 141 mg/dL — ABNORMAL HIGH (ref 65–99)
Glucose-Capillary: 199 mg/dL — ABNORMAL HIGH (ref 65–99)

## 2016-08-28 MED ORDER — GLUCOSE 40 % PO GEL
1.0000 | ORAL | Status: DC | PRN
  Administered 2016-08-28 (×2): 37.5 g via ORAL
  Filled 2016-08-28 (×2): qty 1

## 2016-08-28 MED ORDER — GLUCAGON HCL RDNA (DIAGNOSTIC) 1 MG IJ SOLR
1.0000 mg | Freq: Once | INTRAMUSCULAR | Status: DC
  Filled 2016-08-28: qty 1

## 2016-08-28 MED ORDER — DEXTROSE 50 % IV SOLN
1.0000 | Freq: Once | INTRAVENOUS | Status: AC
  Administered 2016-08-28: 50 mL via INTRAVENOUS
  Filled 2016-08-28: qty 50

## 2016-08-28 MED ORDER — GLUCAGON HCL RDNA (DIAGNOSTIC) 1 MG IJ SOLR: 5.0000 mg | Freq: Once | INTRAMUSCULAR | Status: DC

## 2016-08-28 NOTE — ED Triage Notes (Addendum)
Pt reports fatigue and weakness since yesterday. Reports her cbg was 6033 yesterday, has continued taking her insulin as prescribed. cbg 37 at triage. Pt is awake, alert and oriented at triage.

## 2016-08-28 NOTE — ED Provider Notes (Signed)
[facility] DEPT Provider Note   CSN: [id number] Arrival date & time: 08/28/16  0815     History   Chief Complaint Chief Complaint  Patient presents with  . Hypoglycemia  . Weakness    HPI Stephanie Lloyd is a 46 y.o. female.  HPI Pt comes in with cc of low sugar and weakness. PT has hx of IDDM. She reports doing well last night, but woke up feeling unwell. Pt came in to the ER for her low blood sugar and she was noted to be hypoglycemic.  Pt reports no recent infection and denies any n/v/f/c. Pt has no chest pain, dib. Pt has not changed her insulin or diet. No new meds. No hx of renal or liver disease.  Past Medical History:  Diagnosis Date  . Diabetes ([place])   . Diabetes mellitus without complication ([place])   . Diastolic dysfunction   . Hypertension   . Immune deficiency disorder ([place])   . Menometrorrhagia 04/01/2016  . Stroke [facility])     Patient Active Problem List   Diagnosis Date Noted  . 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 OSA (obstructive sleep apnea) 06/10/2016  . Iron deficiency anemia due to chronic blood loss 04/01/2016  . Menometrorrhagia 04/01/2016  . Upper respiratory infection 03/25/2016  . History of stroke   . History of intracerebral hemorrhage without residual deficit   . 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 Ischemic stroke ([place]) 02/28/2016  . Acute kidney injury ([place]) 10/28/2015  . Acute encephalopathy 10/28/2015  . Diastolic dysfunction   . Stroke (cerebrum) ([place])   . Focal infarction of brain ([place]) 10/08/2015  . Syncope 08/19/2015  . Hypertensive emergency 07/29/2015  . Hypertensive urgency 07/28/2015  . Hypokalemia 07/28/2015  . Accelerated hypertension 09/21/2014  . Type 2 diabetes mellitus with other circulatory complications ([place]) 78/46/9629  . Hyperlipidemia 09/21/2014  . 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  Hyperlipidemia LDL goal <70 07/29/2014  . Hemorrhagic stroke ([place]) 06/22/2014  . ICH (intracerebral hemorrhage) ([place]) 06/22/2014  . Essential hypertension 05/13/2012  . Diabetes mellitus ([place]) 05/13/2012    Past Surgical History:  Procedure Laterality Date  . NO PAST SURGERIES      OB History    Gravida Para Term Preterm AB Living   '2 1 1 ' 0 1 1   SAB TAB Ectopic Multiple Live Births   0 0 0           Home Medications    Prior to Admission medications   Medication Sig Start Date End Date Taking? Authorizing Provider  amLODipine (NORVASC) 10 MG tablet Take 1 tablet (10 mg total) by mouth daily. 07/08/16  Yes Ann Held, DO  aspirin EC 325 MG EC tablet Take 1 tablet (325 mg total) by mouth daily. 03/02/16  Yes Riccio, Angela C, DO  imiquimod (ALDARA) 5 % cream Apply topically 3 (three) times a week. Apply until total clearance or maximum of 16 weeks 08/16/16  Yes Lavonia Drafts, MD  insulin aspart protamine - aspart (NOVOLOG 70/30 MIX) (70-30) 100 UNIT/ML FlexPen 20 u sq q am and 10u sq qpm 07/08/16  Yes Lowne Lyndal Pulley R, DO  lisinopril (PRINIVIL,ZESTRIL) 40 MG tablet Take 1 tablet (40 mg total) by mouth daily. 07/08/16  Yes Roma Schanz R, DO  prednisoLONE acetate (PRED FORTE) 1 % ophthalmic suspension Place 1 drop into both eyes 2 (two) times daily.  Yes [provider]  Vitamin D, Ergocalciferol, (DRISDOL) 50000 units CAPS capsule Take 1 capsule (50,000 Units total) by mouth every 7 (seven) days. 08/02/16  Yes Hulan Saas M, DO  ACCU-CHEK FASTCLIX LANCETS MISC Check blood sugar twice daily. Dx E:11.9 Patient taking differently: Check blood sugar twice daily. Dx E:11.9 09/08/15   Carollee Herter, Alferd Apa, DO  atorvastatin (LIPITOR) 80 MG tablet Take 1 tablet (80 mg total) by mouth daily at 6 PM. Patient not taking: Reported on 08/28/2016 06/10/16   Rosalin Hawking, MD  Blood Glucose Monitoring Suppl (ACCU-CHEK GUIDE) w/Device KIT 1 Device by Does not  apply route as directed. Check blood sugar daily 09/08/15   Carollee Herter, Kendrick Fries R, DO  glucose blood (ACCU-CHEK GUIDE) test strip Check blood sugar twice daily. Dx E:11.9 09/08/15   Ann Held, DO  Insulin Pen Needle (B-D UF III MINI PEN NEEDLES) 31G X 5 MM MISC To use with insulin 07/12/16   Ann Held, DO    Family History Family History  Problem Relation Age of Onset  . Diabetes Father   . Hypertension Father   . Stroke Father   . Diabetes Paternal Uncle   . Stroke Paternal Uncle   . Stroke Brother   . Cancer Neg Hx   . Breast cancer Neg Hx   . Ovarian cancer Neg Hx     Social History Social History  Substance Use Topics  . Smoking status: Never Smoker  . Smokeless tobacco: Never Used  . Alcohol use No     Comment: occ     Allergies   Metformin and related   Review of Systems Review of Systems  Constitutional: Positive for activity change.  Respiratory: Negative for shortness of breath.   Cardiovascular: Negative for chest pain.  Neurological: Positive for weakness.  All other systems reviewed and are negative.    Physical Exam Updated Vital Signs BP (!) 171/90 (BP Location: Right Arm)   Pulse 77   Temp 97.6 F (36.4 C) (Oral)   Resp 12   SpO2 100%   Physical Exam  Constitutional: She is oriented to person, place, and time. She appears well-developed.  HENT:  Head: Normocephalic and atraumatic.  Eyes: EOM are normal.  Neck: Normal range of motion. Neck supple.  Cardiovascular: Normal rate.   Pulmonary/Chest: Effort normal.  Abdominal: Bowel sounds are normal.  Neurological: She is alert and oriented to person, place, and time.  Skin: Skin is warm and dry.  Nursing note and vitals reviewed.    ED Treatments / Results  Labs (all labs ordered are listed, but only abnormal results are displayed) Labs Reviewed  CBC WITH DIFFERENTIAL/PLATELET - Abnormal; Notable for the following:       Result Value   WBC 10.6 (*)    Neutro Abs  8.8 (*)    All other components within normal limits  CBG MONITORING, ED - Abnormal; Notable for the following:    Glucose-Capillary 37 (*)    All other components within normal limits  I-STAT CHEM 8, ED - Abnormal; Notable for the following:    BUN 26 (*)    Creatinine, Ser 1.20 (*)    Glucose, Bld 191 (*)    All other components within normal limits  CBG MONITORING, ED - Abnormal; Notable for the following:    Glucose-Capillary 27 (*)    All other components within normal limits  CBG MONITORING, ED - Abnormal; Notable for the following:    Glucose-Capillary  124 (*)    All other components within normal limits  CBG MONITORING, ED - Abnormal; Notable for the following:    Glucose-Capillary 141 (*)    All other components within normal limits  CBG MONITORING, ED - Abnormal; Notable for the following:    Glucose-Capillary 171 (*)    All other components within normal limits  CBG MONITORING, ED - Abnormal; Notable for the following:    Glucose-Capillary 207 (*)    All other components within normal limits  CBG MONITORING, ED - Abnormal; Notable for the following:    Glucose-Capillary 199 (*)    All other components within normal limits  CBG MONITORING, ED    EKG  EKG Interpretation None       Radiology No results found.  Procedures Procedures (including critical care time) CRITICAL CARE Performed by: Varney Biles   Total critical care time: 34 minutes  Critical care time was exclusive of separately billable procedures and treating other patients.  Critical care was necessary to treat or prevent imminent or life-threatening deterioration.  Critical care was time spent personally by me on the following activities: development of treatment plan with patient and/or surrogate as well as nursing, discussions with consultants, evaluation of patient's response to treatment, examination of patient, obtaining history from patient or surrogate, ordering and performing  treatments and interventions, ordering and review of laboratory studies, ordering and review of radiographic studies, pulse oximetry and re-evaluation of patient's condition.   Medications Ordered in ED Medications  dextrose (GLUTOSE) 40 % oral gel 37.5 g (37.5 g Oral Given 08/28/16 0903)  glucagon (human recombinant) (GLUCAGEN) injection 1 mg (0 mg Intramuscular Hold 08/28/16 0906)  dextrose 50 % solution 50 mL (50 mLs Intravenous Given 08/28/16 0903)     Initial Impression / Assessment and Plan / ED Course  I have reviewed the triage vital signs and the nursing notes.  Pertinent labs & imaging results that were available during my care of the patient were reviewed by me and considered in my medical decision making (see chart for details).     Pt with hypoglycemia. She has iddm, she had visit for the same reason earlier in the month. We are not sure what the underlying cause is - but there is no clinical concerns for infection / ACS / renal failure. It seems like there is no new changes to the med dose and pt has no nausea / emesis /diarrhea or poor po intake either. We called PCP and set up a close f/u for tomorrow morning. Pt advised to take 1/2 the dose of her insulin tonight. Strict ER return precautions have been discussed, and patient is agreeing with the plan and is comfortable with the workup done and the recommendations from the ER.  Pt has had 3 straight normal CBG and has passed po challenge.   Final Clinical Impressions(s) / ED Diagnoses   Final diagnoses:  Weakness  Hypoglycemia  Medication side effect    New Prescriptions New Prescriptions   No medications on file     Varney Biles, MD 08/28/16 1333

## 2016-08-28 NOTE — ED Notes (Signed)
ED Provider at bedside. 

## 2016-08-28 NOTE — Discharge Instructions (Signed)
Please see your doctor tomorrow morning - call them to confirm the time of your appointment tomorrow. We would want you to take half the dose of insulin tonight.

## 2016-08-29 ENCOUNTER — Emergency Department (HOSPITAL_COMMUNITY): Payer: Medicaid Other

## 2016-08-29 ENCOUNTER — Ambulatory Visit: Payer: Medicaid Other | Admitting: Medical

## 2016-08-29 ENCOUNTER — Telehealth: Payer: Self-pay | Admitting: Family Medicine

## 2016-08-29 ENCOUNTER — Observation Stay (HOSPITAL_COMMUNITY)
Admission: EM | Admit: 2016-08-29 | Discharge: 2016-08-30 | Disposition: A | Discharge status: Home / Self Care | Payer: Medicaid Other | Attending: Nephrology | Admitting: Nephrology

## 2016-08-29 ENCOUNTER — Encounter (HOSPITAL_COMMUNITY): Payer: Self-pay

## 2016-08-29 DIAGNOSIS — E784 Other hyperlipidemia: Secondary | ICD-10-CM

## 2016-08-29 DIAGNOSIS — I5189 Other ill-defined heart diseases: Secondary | ICD-10-CM | POA: Diagnosis present

## 2016-08-29 DIAGNOSIS — K5730 Diverticulosis of large intestine without perforation or abscess without bleeding: Secondary | ICD-10-CM | POA: Diagnosis not present

## 2016-08-29 DIAGNOSIS — E119 Type 2 diabetes mellitus without complications: Secondary | ICD-10-CM | POA: Diagnosis not present

## 2016-08-29 DIAGNOSIS — E160 Drug-induced hypoglycemia without coma: Secondary | ICD-10-CM | POA: Diagnosis not present

## 2016-08-29 DIAGNOSIS — Z8673 Personal history of transient ischemic attack (TIA), and cerebral infarction without residual deficits: Secondary | ICD-10-CM | POA: Insufficient documentation

## 2016-08-29 DIAGNOSIS — N179 Acute kidney failure, unspecified: Secondary | ICD-10-CM | POA: Diagnosis present

## 2016-08-29 DIAGNOSIS — E785 Hyperlipidemia, unspecified: Secondary | ICD-10-CM | POA: Diagnosis present

## 2016-08-29 DIAGNOSIS — Z8679 Personal history of other diseases of the circulatory system: Secondary | ICD-10-CM | POA: Diagnosis not present

## 2016-08-29 DIAGNOSIS — E162 Hypoglycemia, unspecified: Secondary | ICD-10-CM

## 2016-08-29 DIAGNOSIS — I519 Heart disease, unspecified: Secondary | ICD-10-CM

## 2016-08-29 DIAGNOSIS — I5032 Chronic diastolic (congestive) heart failure: Secondary | ICD-10-CM | POA: Diagnosis not present

## 2016-08-29 DIAGNOSIS — Z794 Long term (current) use of insulin: Secondary | ICD-10-CM | POA: Insufficient documentation

## 2016-08-29 DIAGNOSIS — Z7982 Long term (current) use of aspirin: Secondary | ICD-10-CM | POA: Diagnosis not present

## 2016-08-29 DIAGNOSIS — I110 Hypertensive heart disease with heart failure: Secondary | ICD-10-CM | POA: Diagnosis not present

## 2016-08-29 DIAGNOSIS — E1159 Type 2 diabetes mellitus with other circulatory complications: Secondary | ICD-10-CM | POA: Diagnosis not present

## 2016-08-29 DIAGNOSIS — D849 Immunodeficiency, unspecified: Secondary | ICD-10-CM | POA: Insufficient documentation

## 2016-08-29 DIAGNOSIS — K5792 Diverticulitis of intestine, part unspecified, without perforation or abscess without bleeding: Principal | ICD-10-CM | POA: Diagnosis present

## 2016-08-29 DIAGNOSIS — G4733 Obstructive sleep apnea (adult) (pediatric): Secondary | ICD-10-CM | POA: Diagnosis present

## 2016-08-29 DIAGNOSIS — N182 Chronic kidney disease, stage 2 (mild): Secondary | ICD-10-CM | POA: Diagnosis not present

## 2016-08-29 DIAGNOSIS — Z79899 Other long term (current) drug therapy: Secondary | ICD-10-CM | POA: Diagnosis not present

## 2016-08-29 DIAGNOSIS — I10 Essential (primary) hypertension: Secondary | ICD-10-CM | POA: Diagnosis not present

## 2016-08-29 DIAGNOSIS — K5790 Diverticulosis of intestine, part unspecified, without perforation or abscess without bleeding: Secondary | ICD-10-CM | POA: Diagnosis present

## 2016-08-29 DIAGNOSIS — T383X5A Adverse effect of insulin and oral hypoglycemic [antidiabetic] drugs, initial encounter: Secondary | ICD-10-CM | POA: Diagnosis present

## 2016-08-29 DIAGNOSIS — G4730 Sleep apnea, unspecified: Secondary | ICD-10-CM | POA: Diagnosis not present

## 2016-08-29 LAB — CBG MONITORING, ED
GLUCOSE-CAPILLARY: 162 mg/dL — AB (ref 65–99)
GLUCOSE-CAPILLARY: 138 mg/dL — AB (ref 65–99)
GLUCOSE-CAPILLARY: 175 mg/dL — AB (ref 65–99)
Glucose-Capillary: 58 mg/dL — ABNORMAL LOW (ref 65–99)
Glucose-Capillary: 88 mg/dL (ref 65–99)
Glucose-Capillary: 102 mg/dL — ABNORMAL HIGH (ref 65–99)

## 2016-08-29 LAB — URINALYSIS, ROUTINE W REFLEX MICROSCOPIC
BILIRUBIN URINE: NEGATIVE
KETONES UR: NEGATIVE mg/dL
Nitrite: NEGATIVE
PROTEIN: NEGATIVE mg/dL
Specific Gravity, Urine: 1.018 (ref 1.005–1.030)
pH: 7 (ref 5.0–8.0)

## 2016-08-29 LAB — COMPREHENSIVE METABOLIC PANEL
ALBUMIN: 4.5 g/dL (ref 3.5–5.0)
ALT: 29 U/L (ref 14–54)
AST: 29 U/L (ref 15–41)
Alkaline Phosphatase: 88 U/L (ref 38–126)
Anion gap: 9 (ref 5–15)
BUN: 17 mg/dL (ref 6–20)
CHLORIDE: 105 mmol/L (ref 101–111)
CO2: 26 mmol/L (ref 22–32)
Calcium: 9.9 mg/dL (ref 8.9–10.3)
Creatinine, Ser: 1.19 mg/dL — ABNORMAL HIGH (ref 0.44–1.00)
GFR calc Af Amer: >60 mL/min (ref >60)
GFR calc non Af Amer: 53 mL/min — ABNORMAL LOW (ref >60)
GLUCOSE: 109 mg/dL — AB (ref 65–99)
POTASSIUM: 4.3 mmol/L (ref 3.5–5.1)
SODIUM: 140 mmol/L (ref 135–145)
Total Bilirubin: 0.5 mg/dL (ref 0.3–1.2)
Total Protein: 8.4 g/dL — ABNORMAL HIGH (ref 6.5–8.1)

## 2016-08-29 LAB — CBC WITH DIFFERENTIAL/PLATELET
BASOS ABS: 0 10*3/uL (ref 0.0–0.1)
BASOS PCT: 0 %
EOS ABS: 0 10*3/uL (ref 0.0–0.7)
EOS PCT: 0 %
HEMATOCRIT: 42.5 % (ref 36.0–46.0)
Hemoglobin: 14.1 g/dL (ref 12.0–15.0)
Lymphocytes Relative: 6 %
Lymphs Abs: 1.1 10*3/uL (ref 0.7–4.0)
MCH: 27.9 pg (ref 26.0–34.0)
MCHC: 33.2 g/dL (ref 30.0–36.0)
MCV: 84.2 fL (ref 78.0–100.0)
MONO ABS: 0.4 10*3/uL (ref 0.1–1.0)
Monocytes Relative: 2 %
NEUTROS ABS: 16.6 10*3/uL — AB (ref 1.7–7.7)
Neutrophils Relative %: 92 %
PLATELETS: 261 10*3/uL (ref 150–400)
RBC: 5.05 MIL/uL (ref 3.87–5.11)
RDW: 13 % (ref 11.5–15.5)
WBC: 18.1 10*3/uL — ABNORMAL HIGH (ref 4.0–10.5)

## 2016-08-29 LAB — I-STAT BETA HCG BLOOD, ED (MC, WL, AP ONLY)

## 2016-08-29 MED ORDER — DEXTROSE 5 % IV SOLN
2.0000 g | Freq: Once | INTRAVENOUS | Status: AC
  Administered 2016-08-29: 2 g via INTRAVENOUS
  Filled 2016-08-29: qty 2

## 2016-08-29 MED ORDER — FLEET ENEMA 7-19 GM/118ML RE ENEM: 1.0000 | ENEMA | Freq: Once | RECTAL | Status: DC

## 2016-08-29 MED ORDER — BISACODYL 10 MG RE SUPP: 10.0000 mg | Freq: Every day | RECTAL | Status: DC | PRN

## 2016-08-29 MED ORDER — ACETAMINOPHEN 650 MG RE SUPP: 650.0000 mg | Freq: Four times a day (QID) | RECTAL | Status: DC | PRN

## 2016-08-29 MED ORDER — DEXTROSE 50 % IV SOLN
INTRAVENOUS | Status: AC
  Filled 2016-08-29: qty 50

## 2016-08-29 MED ORDER — PREDNISOLONE ACETATE 1 % OP SUSP
1.0000 [drp] | Freq: Two times a day (BID) | OPHTHALMIC | Status: DC
  Administered 2016-08-30 (×2): 1 [drp] via OPHTHALMIC
  Filled 2016-08-29 (×2): qty 5

## 2016-08-29 MED ORDER — DEXTROSE 50 % IV SOLN
1.0000 | Freq: Once | INTRAVENOUS | Status: AC
  Administered 2016-08-29: 50 mL via INTRAVENOUS

## 2016-08-29 MED ORDER — SODIUM CHLORIDE 0.9 % IV BOLUS (SEPSIS)
1000.0000 mL | Freq: Once | INTRAVENOUS | Status: AC
  Administered 2016-08-29: 1000 mL via INTRAVENOUS

## 2016-08-29 MED ORDER — ONDANSETRON HCL 4 MG PO TABS: 4.0000 mg | ORAL_TABLET | Freq: Four times a day (QID) | ORAL | Status: DC | PRN

## 2016-08-29 MED ORDER — IOPAMIDOL (ISOVUE-300) INJECTION 61%
INTRAVENOUS | Status: AC
  Administered 2016-08-29: 100 mL
  Filled 2016-08-29: qty 100

## 2016-08-29 MED ORDER — ASPIRIN EC 325 MG PO TBEC
325.0000 mg | DELAYED_RELEASE_TABLET | Freq: Every day | ORAL | Status: DC
  Administered 2016-08-30: 325 mg via ORAL
  Filled 2016-08-29: qty 1

## 2016-08-29 MED ORDER — AMLODIPINE BESYLATE 10 MG PO TABS
10.0000 mg | ORAL_TABLET | Freq: Every day | ORAL | Status: DC
  Administered 2016-08-30: 10 mg via ORAL
  Filled 2016-08-29: qty 1

## 2016-08-29 MED ORDER — DEXTROSE-NACL 5-0.45 % IV SOLN
INTRAVENOUS | Status: DC
  Administered 2016-08-29: 23:00:00 via INTRAVENOUS

## 2016-08-29 MED ORDER — ACETAMINOPHEN 325 MG PO TABS: 650.0000 mg | ORAL_TABLET | Freq: Four times a day (QID) | ORAL | Status: DC | PRN

## 2016-08-29 MED ORDER — POLYETHYLENE GLYCOL 3350 17 G PO PACK: 17.0000 g | PACK | Freq: Every day | ORAL | Status: DC | PRN

## 2016-08-29 MED ORDER — INSULIN ASPART 100 UNIT/ML ~~LOC~~ SOLN
0.0000 [IU] | SUBCUTANEOUS | Status: DC
  Administered 2016-08-30 (×3): 2 [IU] via SUBCUTANEOUS

## 2016-08-29 MED ORDER — SENNA 8.6 MG PO TABS
1.0000 | ORAL_TABLET | Freq: Two times a day (BID) | ORAL | Status: DC
  Administered 2016-08-30 (×2): 8.6 mg via ORAL
  Filled 2016-08-29 (×2): qty 1

## 2016-08-29 MED ORDER — ONDANSETRON HCL 4 MG/2ML IJ SOLN: 4.0000 mg | Freq: Four times a day (QID) | INTRAMUSCULAR | Status: DC | PRN

## 2016-08-29 MED ORDER — PIPERACILLIN-TAZOBACTAM 3.375 G IVPB
3.3750 g | Freq: Three times a day (TID) | INTRAVENOUS | Status: DC
  Administered 2016-08-30 (×2): 3.375 g via INTRAVENOUS
  Filled 2016-08-29 (×4): qty 50

## 2016-08-29 MED ORDER — METRONIDAZOLE IN NACL 5-0.79 MG/ML-% IV SOLN
500.0000 mg | Freq: Once | INTRAVENOUS | Status: AC
  Administered 2016-08-29: 500 mg via INTRAVENOUS
  Filled 2016-08-29: qty 100

## 2016-08-29 MED ORDER — HYDROCODONE-ACETAMINOPHEN 5-325 MG PO TABS
1.0000 | ORAL_TABLET | ORAL | Status: DC | PRN
  Administered 2016-08-30: 2 via ORAL
  Filled 2016-08-29: qty 2

## 2016-08-29 MED ORDER — LISINOPRIL 40 MG PO TABS
40.0000 mg | ORAL_TABLET | Freq: Every day | ORAL | Status: DC
  Administered 2016-08-30: 40 mg via ORAL
  Filled 2016-08-29: qty 1

## 2016-08-29 NOTE — Telephone Encounter (Signed)
charge 

## 2016-08-29 NOTE — ED Notes (Signed)
Pt wants a snack

## 2016-08-29 NOTE — ED Triage Notes (Signed)
Pt endorses that her LBM was "a few days ago" and 1 hour PTA pt states "I feel like I have to go to the bathroom and I can't" VSS.

## 2016-08-29 NOTE — ED Provider Notes (Signed)
[facility] DEPT Provider Note   CSN: [id number] Arrival date & time: 08/29/16  1233     History   Chief Complaint Chief Complaint  Patient presents with  . Constipation  . Hypoglycemia    HPI   Blood pressure (!) 168/101, pulse 65, temperature (!) 97.4 F (36.3 C), temperature source Oral, resp. rate 18, height 5' 10" (1.778 m), weight 82.6 kg (182 lb), SpO2 100 %.  Stephanie Lloyd is a 46 y.o. female IDDM, CVA c/o constipation and hypoglcemia. Patient states she checked her blood sugar this morning it was normal, she administered her regular dose and when she rechecked her blood sugar it was low in the mid 60s. She did not feel poorly at that time. There was no recent change to her insulin regimen. She does not take any oral diabetic medications. She was scheduled to see her primary care physician today but could not go because there was an issue with transportation. She's been in her normal state of health with no fever, chills, nausea, vomiting, pain except for she is having anal pressure and states that she hasn't had a bowel movement in 2 days. She also notes that she's not passing flatus normally today. She doesn't have a history of abdominal surgeries.  Past Medical History:  Diagnosis Date  . Diabetes ([place])   . Diabetes mellitus without complication ([place])   . Diastolic dysfunction   . Hypertension   . Immune deficiency disorder ([place])   . Menometrorrhagia 04/01/2016  . Stroke [facility])     Patient Active Problem List   Diagnosis Date Noted  . Hypoglycemia due to insulin 08/29/2016  . Diverticulosis 08/29/2016  . 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 OSA (obstructive sleep apnea) 06/10/2016  . Iron deficiency anemia due to chronic blood loss 04/01/2016  . Menometrorrhagia 04/01/2016  . Upper respiratory infection 03/25/2016  . History of stroke   . History of intracerebral hemorrhage without residual deficit   .  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 Ischemic stroke ([place]) 02/28/2016  . Acute kidney injury ([place]) 10/28/2015  . Acute encephalopathy 10/28/2015  . Diastolic dysfunction   . Stroke (cerebrum) ([place])   . Focal infarction of brain ([place]) 10/08/2015  . Syncope 08/19/2015  . Hypertensive emergency 07/29/2015  . Hypertensive urgency 07/28/2015  . Hypokalemia 07/28/2015  . Accelerated hypertension 09/21/2014  . Type 2 diabetes mellitus with other circulatory complications ([place]) 70/96/2836  . Hyperlipidemia 09/21/2014  . 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 Hyperlipidemia LDL goal <70 07/29/2014  . Hemorrhagic stroke ([place]) 06/22/2014  . ICH (intracerebral hemorrhage) ([place]) 06/22/2014  . Essential hypertension 05/13/2012  . Diabetes mellitus ([place]) 05/13/2012    Past Surgical History:  Procedure Laterality Date  . NO PAST SURGERIES      OB History    Gravida Para Term Preterm AB Living   _0 0 1 1   SAB TAB Ectopic Multiple Live Births   0 0 0           Home Medications    Prior to Admission medications   Medication Sig Start Date End Date Taking? Authorizing Provider  amLODipine (NORVASC) 10 MG tablet Take 1 tablet (10 mg total) by mouth daily. 07/08/16  Yes Ann Held, DO  aspirin EC 325 MG EC tablet Take 1 tablet (325 mg total) by mouth daily. 03/02/16  Yes Steve Rattler, DO  Cyanocobalamin (VITAMIN B-12 PO) Take 1 tablet by mouth daily with breakfast.   Yes [provider]  imiquimod (ALDARA) 5 % cream Apply topically 3 (three) times a week. Apply until total clearance or maximum of 16 weeks 08/16/16  Yes Harraway-Smith, Hoyle Sauer, MD  insulin aspart protamine - aspart (NOVOLOG 70/30 MIX) (70-30) 100 UNIT/ML FlexPen 20 u sq q am and 10u sq qpm Patient taking differently: Inject 10-20 Units into the skin See admin instructions. 20 units with breakfast and 10 units at bedtime 07/08/16  Yes Lowne  Lyndal Pulley R, DO  lisinopril (PRINIVIL,ZESTRIL) 40 MG tablet Take 1 tablet (40 mg total) by mouth daily. 07/08/16  Yes Roma Schanz R, DO  prednisoLONE acetate (PRED FORTE) 1 % ophthalmic suspension Place 1 drop into both eyes 2 (two) times daily.    Yes [provider]  Pyridoxine HCl (VITAMIN B-6 PO) Take 1 tablet by mouth daily with breakfast.   Yes [provider]  Vitamin D, Ergocalciferol, (DRISDOL) 50000 units CAPS capsule Take 1 capsule (50,000 Units total) by mouth every 7 (seven) days. 08/02/16  Yes Hulan Saas M, DO  ACCU-CHEK FASTCLIX LANCETS MISC Check blood sugar twice daily. Dx E:11.9 Patient taking differently: Check blood sugar twice daily. Dx E:11.9 09/08/15   Carollee Herter, Alferd Apa, DO  atorvastatin (LIPITOR) 80 MG tablet Take 1 tablet (80 mg total) by mouth daily at 6 PM. Patient not taking: Reported on 08/29/2016 06/10/16   Rosalin Hawking, MD  Blood Glucose Monitoring Suppl (ACCU-CHEK GUIDE) w/Device KIT 1 Device by Does not apply route as directed. Check blood sugar daily 09/08/15   Carollee Herter, Kendrick Fries R, DO  glucose blood (ACCU-CHEK GUIDE) test strip Check blood sugar twice daily. Dx E:11.9 09/08/15   Ann Held, DO  Insulin Pen Needle (B-D UF III MINI PEN NEEDLES) 31G X 5 MM MISC To use with insulin 07/12/16   Ann Held, DO    Family History Family History  Problem Relation Age of Onset  . Diabetes Father   . Hypertension Father   . Stroke Father   . Diabetes Paternal Uncle   . Stroke Paternal Uncle   . Stroke Brother   . Cancer Neg Hx   . Breast cancer Neg Hx   . Ovarian cancer Neg Hx     Social History Social History  Substance Use Topics  . Smoking status: Never Smoker  . Smokeless tobacco: Never Used  . Alcohol use No     Comment: occ     Allergies   Metformin and related   Review of Systems Review of Systems  A complete review of systems was obtained and all systems are negative except as noted in the  HPI and PMH.    Physical Exam Updated Vital Signs BP (!) 159/103   Pulse 70   Temp (!) 97.4 F (36.3 C) (Oral)   Resp (!) 28   Ht 5' 10" (1.778 m)   Wt 82.6 kg (182 lb)   SpO2 99%   BMI 26.11 kg/m   Physical Exam  Constitutional: She is oriented to person, place, and time. She appears well-developed and well-nourished. No distress.  HENT:  Head: Normocephalic and atraumatic.  Mouth/Throat: Oropharynx is clear and moist.  Eyes: Pupils are equal, round, and reactive to light. Conjunctivae and EOM are normal.  Neck: Normal range of motion.  Cardiovascular: Normal rate, regular rhythm and intact distal pulses.  Exam reveals no gallop and no friction rub.  No murmur heard. Pulmonary/Chest: Effort normal and breath sounds normal. No respiratory distress. She has no wheezes. She has no rales. She exhibits no tenderness.  Abdominal: Soft. She exhibits no distension and no mass. There is no tenderness. There is no rebound and no guarding. No hernia.  Musculoskeletal: Normal range of motion.  Neurological: She is alert and oriented to person, place, and time.  Skin: She is not diaphoretic.  Psychiatric: She has a normal mood and affect.  Nursing note and vitals reviewed.    ED Treatments / Results  Labs (all labs ordered are listed, but only abnormal results are displayed) Labs Reviewed  CBC WITH DIFFERENTIAL/PLATELET - Abnormal; Notable for the following:       Result Value   WBC 18.1 (*)    Neutro Abs 16.6 (*)    All other components within normal limits  COMPREHENSIVE METABOLIC PANEL - Abnormal; Notable for the following:    Glucose, Bld 109 (*)    Creatinine, Ser 1.19 (*)    Total Protein 8.4 (*)    GFR calc non Af Amer 53 (*)    All other components within normal limits  URINALYSIS, ROUTINE W REFLEX MICROSCOPIC - Abnormal; Notable for the following:    Color, Urine STRAW (*)    Glucose, UA >=500 (*)    Hgb urine dipstick SMALL (*)    Leukocytes, UA LARGE (*)     Bacteria, UA RARE (*)    Squamous Epithelial / LPF 0-5 (*)    All other components within normal limits  CBG MONITORING, ED - Abnormal; Notable for the following:    Glucose-Capillary 58 (*)    All other components within normal limits  CBG MONITORING, ED - Abnormal; Notable for the following:    Glucose-Capillary 162 (*)    All other components within normal limits  CBG MONITORING, ED - Abnormal; Notable for the following:    Glucose-Capillary 138 (*)    All other components within normal limits  CBG MONITORING, ED - Abnormal; Notable for the following:    Glucose-Capillary 102 (*)    All other components within normal limits  CBG MONITORING, ED  I-STAT BETA HCG BLOOD, ED ([facility], [facility], [facility] ONLY)    EKG  EKG Interpretation None       Radiology Ct Abdomen Pelvis W Contrast  Result Date: 08/29/2016 CLINICAL DATA:  Abdominal distention for several days. EXAM: CT ABDOMEN AND PELVIS WITH CONTRAST TECHNIQUE: Multidetector CT imaging of the abdomen and pelvis was performed using the standard protocol following bolus administration of intravenous contrast. CONTRAST:  157m ISOVUE-300 IOPAMIDOL (ISOVUE-300) INJECTION 61% COMPARISON:  None. FINDINGS: Lower chest: Small bilateral pleural effusions. Small pericardial effusion measuring 12 mm in maximum thickness. Mild patchy opacity at the right lung base and minimal patchy opacity at the left lung base. Hepatobiliary: No focal liver abnormality is seen. No gallstones, gallbladder wall thickening, or biliary dilatation. Pancreas: Unremarkable. No pancreatic ductal dilatation or surrounding inflammatory changes. Spleen: Normal in size without focal abnormality. Adrenals/Urinary Tract: Adrenal glands are unremarkable. Kidneys are normal, without renal calculi, focal lesion, or hydronephrosis. Bladder is unremarkable. Stomach/Bowel: Prominent stool in the colon with mild perirectal edema. Under distended sigmoid colon with mild diffuse wall thickening and  mildly ill-defined margins. No gastric or small bowel abnormalities. No evidence of appendicitis. Vascular/Lymphatic: Mild atheromatous arterial calcifications without aneurysm. No enlarged lymph nodes. Reproductive: Intrauterine device in the central uterus. Left anterior exophytic uterine mass measuring 3.2 x 1.8 cm on image number 59  of series 3. This is also seen on coronal image number 49 and sagittal image number 104. No adnexal masses. Other: Minimal free peritoneal fluid. Musculoskeletal: Minimal lumbar spine degenerative changes. IMPRESSION: 1. Findings suggesting mild sigmoid colitis or diverticulitis. 2. Small amount of free peritoneal fluid. 3. Small bilateral pleural effusions. 4. Small pericardial effusion. 5. 3.2 cm exophytic uterine fibroid. 6. Mild right basilar and minimal left basilar patchy atelectasis or pneumonia. Electronically Signed   By: Claudie Revering M.D.   On: 08/29/2016 20:54    Procedures Fecal disimpaction Date/Time: 08/29/2016 5:13 PM Performed by: Monico Blitz Authorized by: Monico Blitz  Consent: Verbal consent obtained. Consent given by: patient Patient identity confirmed: verbally with patient Local anesthesia used: no  Anesthesia: Local anesthesia used: no  Sedation: Patient sedated: no Patient tolerance: Patient tolerated the procedure well with no immediate complications Comments: Large amount of firm stool root moved without complication    (including critical care time)  Medications Ordered in ED Medications  dextrose 50 % solution (not administered)  cefTRIAXone (ROCEPHIN) 2 g in dextrose 5 % 50 mL IVPB (2 g Intravenous New Bag/Given 08/29/16 2152)    And  metroNIDAZOLE (FLAGYL) IVPB 500 mg (500 mg Intravenous New Bag/Given 08/29/16 2152)  dextrose 50 % solution 50 mL (50 mLs Intravenous Given 08/29/16 1252)  sodium chloride 0.9 % bolus 1,000 mL (0 mLs Intravenous Stopped 08/29/16 1900)  iopamidol (ISOVUE-300) 61 % injection (100 mLs  Contrast  Given 08/29/16 2023)     Initial Impression / Assessment and Plan / ED Course  I have reviewed the triage vital signs and the nursing notes.  Pertinent labs & imaging results that were available during my care of the patient were reviewed by me and considered in my medical decision making (see chart for details).    Vitals:   08/29/16 1900 08/29/16 1915 08/29/16 2015 08/29/16 2151  BP: (!) 158/92 (!) 168/96 (!) 158/91 (!) 159/103  Pulse:      Resp: (!) _0 (!) 28  Temp:      TempSrc:      SpO2:      Weight:      Height:        Medications  dextrose 50 % solution (not administered)  cefTRIAXone (ROCEPHIN) 2 g in dextrose 5 % 50 mL IVPB (2 g Intravenous New Bag/Given 08/29/16 2152)    And  metroNIDAZOLE (FLAGYL) IVPB 500 mg (500 mg Intravenous New Bag/Given 08/29/16 2152)  dextrose 50 % solution 50 mL (50 mLs Intravenous Given 08/29/16 1252)  sodium chloride 0.9 % bolus 1,000 mL (0 mLs Intravenous Stopped 08/29/16 1900)  iopamidol (ISOVUE-300) 61 % injection (100 mLs  Contrast Given 08/29/16 2023)    Stephanie Lloyd is 46 y.o. female presenting with Hypoglycemia and constipation. Abdominal exam is benign. She reports decreased passage of flatus. Given this and her elevated white count will obtain CT abdomen pelvis. Disimpaction performed with good relief. Patient's creatinine is at her baseline.   Patient has an elevated white count of 1800, she's been a diabetic for 20 years, there's been no recent change in her medications, question why she's been hypoglycemic multiple times in the last several days. Will obtain CT abdomen pelvis.  CAT scan shows a diverticulitis versus sigmoid colitis, small amount of free fluid. Patient started on Rocephin and Flagyl, admitted to [facility] hospitalist   Final Clinical Impressions(s) / ED Diagnoses   Final diagnoses:  Hypoglycemia  Diverticulitis    New Prescriptions New  Prescriptions   No medications on file     Waynetta Pean 08/29/16 2158    Charlesetta Shanks, MD 08/30/16 0005

## 2016-08-29 NOTE — H&P (Signed)
Stephanie Lloyd:[id number] DOB: [birth year] DOA: 08/29/2016     PCP: Ann Held, DO   Outpatient Specialists: [facility]   Patient coming from:  home Lives   With family    Chief Complaint: generalized fatigue and weakness  HPI: Stephanie Lloyd is a 46 y.o. female with medical history significant of DM2, HLD, HTN, hx of ICH and multiple strokes, diastolic CHF, anemia    Presented with Constipation for the past 4 days denied any nausea vomiting no fever no chills. Not passing gas as much as usual.  In ER she under gone manual disimpaction with good results. She was noted to have  a cytosis up to 18 and CT of abdomen was obtained. Showing mild sigmoid colitis or diverticulitis.  Denies any cough or wheezing no shortness of breath.  Yesterday she was seen for generalized fatigue and weakness she was having blood sugars down to 33  Continue to use her insulin 70/30. She was instructed to decrease her insulin dose by half and follow up with her PCP. This morning she checked her blood sugar was normal and she admit also friend's son after which she became hypoglycemic again she was asymptomatic she can follow-up with her PCP because she had no transportation. Regarding pertinent Chronic problems: History of diabetes mellitus on 70/30.  History of right thalamic small ICH 2016 resulting in left sided weakness, slurry speech and dysphagia Which have now resolved and patient is doing much better. In September 2017 she had repeat admission for right-sided numbness was found to have acute infarct within the posterior inferior left midbrain and pons on the left with multiple remote infarcts.  In January 2018 MRI showed left paramedian pontine infarct, small right insular cortex infarct, and extensive old ischemic changes. EF 60-65% and no SOE. Increased ASA to 347m. pt is not a good candidate for anticoagulation due to hx of ICH and extensive microbleds on MRI, also did not recommend  plavix for the same reason.   IN ER:  Temp (24hrs), Avg:97.6 F (36.4 C), Min:97.4 F (36.3 C), Max:97.7 F (36.5 C)      on arrival  ED Triage Vitals  Enc Vitals Group     BP 08/29/16 1238 139/74     Pulse Rate 08/29/16 1238 62     Resp 08/29/16 1238 18     Temp 08/29/16 1238 97.7 F (36.5 C)     Temp Source 08/29/16 1238 Oral     SpO2 08/29/16 1238 100 %     Weight 08/29/16 1240 182 lb (82.6 kg)     Height 08/29/16 1240 '5\' 10"'  (1.778 m)     Head Circumference --      Peak Flow --      Pain Score 08/29/16 1239 0     Pain Loc --      Pain Edu? --      Excl. in [place] --    RR 22 98% HR 82 BP 142/104 WBC 18.1 Hg 14.1 Na 140 K 4.3 Cr 1.19 at baseline BG 58>162>138  CT abd: mild sigmoid colitis or diverticulitis also Mild right basilar and minimal left basilar patchy atelectasis or pneumonia.  Following Medications were ordered in ER: Medications  dextrose 50 % solution (not administered)  cefTRIAXone (ROCEPHIN) 2 g in dextrose 5 % 50 mL IVPB (not administered)    And  metroNIDAZOLE (FLAGYL) IVPB 500 mg (not administered)  dextrose 50 % solution 50 mL (50 mLs Intravenous Given  08/29/16 1252)  sodium chloride 0.9 % bolus 1,000 mL (1,000 mLs Intravenous New Bag/Given 08/29/16 1650)  iopamidol (ISOVUE-300) 61 % injection (100 mLs  Contrast Given 08/29/16 2023)      Hospitalist was called for admission for Diverticulitis in the setting of Hypoglycemia  Review of Systems:    Pertinent positives include:  Fatigue, constipation  Constitutional:  No weight loss, night sweats, Fevers, chills,, weight loss  HEENT:  No headaches, Difficulty swallowing,Tooth/dental problems,Sore throat,  No sneezing, itching, ear ache, nasal congestion, post nasal drip,  Cardio-vascular:  No chest pain, Orthopnea, PND, anasarca, dizziness, palpitations.no Bilateral lower extremity swelling  GI:  No heartburn, indigestion, abdominal pain, nausea, vomiting, diarrhea, change in bowel habits, loss of  appetite, melena, blood in stool, hematemesis Resp:  no shortness of breath at rest. No dyspnea on exertion, No excess mucus, no productive cough, No non-productive cough, No coughing up of blood.No change in color of mucus.No wheezing. Skin:  no rash or lesions. No jaundice GU:  no dysuria, change in color of urine, no urgency or frequency. No straining to urinate.  No flank pain.  Musculoskeletal:  No joint pain or no joint swelling. No decreased range of motion. No back pain.  Psych:  No change in mood or affect. No depression or anxiety. No memory loss.  Neuro: no localizing neurological complaints, no tingling, no weakness, no double vision, no gait abnormality, no slurred speech, no confusion  As per HPI otherwise 10 point review of systems negative.   Past Medical History: Past Medical History:  Diagnosis Date  . Diabetes ([place])   . Diabetes mellitus without complication ([place])   . Diastolic dysfunction   . Hypertension   . Immune deficiency disorder ([place])   . Menometrorrhagia 04/01/2016  . Stroke [facility])    Past Surgical History:  Procedure Laterality Date  . NO PAST SURGERIES      2 Social History:  Ambulatory   independently    reports that she has never smoked. She has never used smokeless tobacco. She reports that she does not drink alcohol or use drugs.  Allergies:   Allergies  Allergen Reactions  . Metformin And Related Diarrhea       Family History:   Family History  Problem Relation Age of Onset  . Diabetes Father   . Hypertension Father   . Stroke Father   . Diabetes Paternal Uncle   . Stroke Paternal Uncle   . Stroke Brother   . Cancer Neg Hx   . Breast cancer Neg Hx   . Ovarian cancer Neg Hx     Medications: Prior to Admission medications   Medication Sig Start Date End Date Taking? Authorizing Provider  amLODipine (NORVASC) 10 MG tablet Take 1 tablet (10 mg total) by mouth daily. 07/08/16  Yes Ann Held, DO  aspirin EC 325 MG  EC tablet Take 1 tablet (325 mg total) by mouth daily. 03/02/16  Yes Riccio, Levada Dy C, DO  Cyanocobalamin (VITAMIN B-12 PO) Take 1 tablet by mouth daily with breakfast.   Yes [provider]  imiquimod (ALDARA) 5 % cream Apply topically 3 (three) times a week. Apply until total clearance or maximum of 16 weeks 08/16/16  Yes Harraway-Smith, Hoyle Sauer, MD  insulin aspart protamine - aspart (NOVOLOG 70/30 MIX) (70-30) 100 UNIT/ML FlexPen 20 u sq q am and 10u sq qpm Patient taking differently: Inject 10-20 Units into the skin See admin instructions. 20 units with breakfast and 10 units at bedtime  07/08/16  Yes Roma Schanz R, DO  lisinopril (PRINIVIL,ZESTRIL) 40 MG tablet Take 1 tablet (40 mg total) by mouth daily. 07/08/16  Yes Roma Schanz R, DO  prednisoLONE acetate (PRED FORTE) 1 % ophthalmic suspension Place 1 drop into both eyes 2 (two) times daily.    Yes [provider]  Pyridoxine HCl (VITAMIN B-6 PO) Take 1 tablet by mouth daily with breakfast.   Yes [provider]  Vitamin D, Ergocalciferol, (DRISDOL) 50000 units CAPS capsule Take 1 capsule (50,000 Units total) by mouth every 7 (seven) days. 08/02/16  Yes Hulan Saas M, DO  ACCU-CHEK FASTCLIX LANCETS MISC Check blood sugar twice daily. Dx E:11.9 Patient taking differently: Check blood sugar twice daily. Dx E:11.9 09/08/15   Carollee Herter, Alferd Apa, DO  atorvastatin (LIPITOR) 80 MG tablet Take 1 tablet (80 mg total) by mouth daily at 6 PM. Patient not taking: Reported on 08/29/2016 06/10/16   Rosalin Hawking, MD  Blood Glucose Monitoring Suppl (ACCU-CHEK GUIDE) w/Device KIT 1 Device by Does not apply route as directed. Check blood sugar daily 09/08/15   Carollee Herter, Kendrick Fries R, DO  glucose blood (ACCU-CHEK GUIDE) test strip Check blood sugar twice daily. Dx E:11.9 09/08/15   Carollee Herter, Alferd Apa, DO  Insulin Pen Needle (B-D UF III MINI PEN NEEDLES) 31G X 5 MM MISC To use with insulin 07/12/16   Ann Held, DO     Physical Exam: Patient Vitals for the past 24 hrs:  BP Temp Temp src Pulse Resp SpO2 Height Weight  08/29/16 1745 (!) 142/104 - - - (!) 22 - - -  08/29/16 1715 (!) 158/86 - - 70 20 99 % - -  08/29/16 1630 (!) 159/95 - - - 20 - - -  08/29/16 1530 (!) 161/87 - - - 17 - - -  08/29/16 1515 (!) 168/101 - - 71 16 95 % - -  08/29/16 1500 (!) 168/101 - - 65 18 100 % - -  08/29/16 1423 (!) 170/77 (!) 97.4 F (36.3 C) Oral 68 16 100 % - -  08/29/16 1240 - - - - - - '5\' 10"'  (1.778 m) 82.6 kg (182 lb)  08/29/16 1238 139/74 97.7 F (36.5 C) Oral 62 18 100 % - -    1. General:  in No Acute distress 2. Psychological: Alert and  Oriented 3. Head/ENT:     Dry Mucous Membranes                          Head Non traumatic, neck supple                           Poor Dentition 4. SKIN:  decreased Skin turgor,  Skin clean Dry and intact no rash 5. Heart: Regular rate and rhythm no  Murmur, Rub or gallop 6. Lungs:   no wheezes or crackles   7. Abdomen: Soft, non-tender, Non distended 8. Lower extremities: no clubbing, cyanosis, or edema 9. Neurologically Grossly intact, moving all 4 extremities equally   10. MSK: Normal range of motion   body mass index is 26.11 kg/m.  Labs on Admission:   Labs on Admission: I have personally reviewed following labs and imaging studies  CBC:  Recent Labs Lab 08/28/16 1011 08/28/16 1022 08/29/16 1527  WBC 10.6*  --  18.1*  NEUTROABS 8.8*  --  16.6*  HGB 13.2 15.0 14.1  HCT 41.6 44.0 42.5  MCV 84.7  --  84.2  PLT 236  --  616   Basic Metabolic Panel:  Recent Labs Lab 08/28/16 1022 08/29/16 1527  NA 142 140  K 3.9 4.3  CL 105 105  CO2  --  26  GLUCOSE 191* 109*  BUN 26* 17  CREATININE 1.20* 1.19*  CALCIUM  --  9.9   GFR: Estimated Creatinine Clearance: 67.6 mL/min (A) (by C-G formula based on SCr of 1.19 mg/dL (H)). Liver Function Tests:  Recent Labs Lab 08/29/16 1527  AST 29  ALT 29  ALKPHOS 88  BILITOT 0.5  PROT 8.4*  ALBUMIN  4.5   No results for input(s): LIPASE, AMYLASE in the last 168 hours. No results for input(s): AMMONIA in the last 168 hours. Coagulation Profile: No results for input(s): INR, PROTIME in the last 168 hours. Cardiac Enzymes: No results for input(s): CKTOTAL, CKMB, CKMBINDEX, TROPONINI in the last 168 hours. BNP (last 3 results) No results for input(s): PROBNP in the last 8760 hours. HbA1C: No results for input(s): HGBA1C in the last 72 hours. CBG:  Recent Labs Lab 08/29/16 1245 08/29/16 1318 08/29/16 1432 08/29/16 1601 08/29/16 1757  GLUCAP 58* 162* 138* 88 102*   Lipid Profile: No results for input(s): CHOL, HDL, LDLCALC, TRIG, CHOLHDL, LDLDIRECT in the last 72 hours. Thyroid Function Tests: No results for input(s): TSH, T4TOTAL, FREET4, T3FREE, THYROIDAB in the last 72 hours. Anemia Panel: No results for input(s): VITAMINB12, FOLATE, FERRITIN, TIBC, IRON, RETICCTPCT in the last 72 hours. Urine analysis:    Component Value Date/Time   COLORURINE STRAW (A) 08/29/2016 2130   APPEARANCEUR CLEAR 08/29/2016 2130   LABSPEC 1.018 08/29/2016 2130   PHURINE 7.0 08/29/2016 2130   GLUCOSEU >=500 (A) 08/29/2016 2130   HGBUR SMALL (A) 08/29/2016 2130   BILIRUBINUR NEGATIVE 08/29/2016 2130   BILIRUBINUR neg 03/06/2016 1619   KETONESUR NEGATIVE 08/29/2016 2130   PROTEINUR NEGATIVE 08/29/2016 2130   UROBILINOGEN 1.0 03/06/2016 1619   UROBILINOGEN 0.2 12/12/2011 1135   NITRITE NEGATIVE 08/29/2016 2130   LEUKOCYTESUR LARGE (A) 08/29/2016 2130   Sepsis Labs: '@LABRCNTIP' (procalcitonin:4,lacticidven:4) )No results found for this or any previous visit (from the past 240 hour(s)).    UA  no evidence of UTI     Lab Results  Component Value Date   HGBA1C 7.7 (H) 07/08/2016    Estimated Creatinine Clearance: 67.6 mL/min (A) (by C-G formula based on SCr of 1.19 mg/dL (H)).  BNP (last 3 results) No results for input(s): PROBNP in the last 8760 hours.   ECG REPORT Not  ordered  Filed Weights   08/29/16 1240  Weight: 82.6 kg (182 lb)     Cultures:    Component Value Date/Time   SDES URINE, RANDOM 10/29/2015 1330   SPECREQUEST NONE 10/29/2015 1330   CULT MULTIPLE SPECIES PRESENT, SUGGEST RECOLLECTION (A) 10/29/2015 1330   REPTSTATUS 10/30/2015 FINAL 10/29/2015 1330     Radiological Exams on Admission: Ct Abdomen Pelvis W Contrast  Result Date: 08/29/2016 CLINICAL DATA:  Abdominal distention for several days. EXAM: CT ABDOMEN AND PELVIS WITH CONTRAST TECHNIQUE: Multidetector CT imaging of the abdomen and pelvis was performed using the standard protocol following bolus administration of intravenous contrast. CONTRAST:  160m ISOVUE-300 IOPAMIDOL (ISOVUE-300) INJECTION 61% COMPARISON:  None. FINDINGS: Lower chest: Small bilateral pleural effusions. Small pericardial effusion measuring 12 mm in maximum thickness. Mild patchy opacity at the right lung base and minimal patchy opacity at the left lung base. Hepatobiliary: No  focal liver abnormality is seen. No gallstones, gallbladder wall thickening, or biliary dilatation. Pancreas: Unremarkable. No pancreatic ductal dilatation or surrounding inflammatory changes. Spleen: Normal in size without focal abnormality. Adrenals/Urinary Tract: Adrenal glands are unremarkable. Kidneys are normal, without renal calculi, focal lesion, or hydronephrosis. Bladder is unremarkable. Stomach/Bowel: Prominent stool in the colon with mild perirectal edema. Under distended sigmoid colon with mild diffuse wall thickening and mildly ill-defined margins. No gastric or small bowel abnormalities. No evidence of appendicitis. Vascular/Lymphatic: Mild atheromatous arterial calcifications without aneurysm. No enlarged lymph nodes. Reproductive: Intrauterine device in the central uterus. Left anterior exophytic uterine mass measuring 3.2 x 1.8 cm on image number 59 of series 3. This is also seen on coronal image number 49 and sagittal image number  104. No adnexal masses. Other: Minimal free peritoneal fluid. Musculoskeletal: Minimal lumbar spine degenerative changes. IMPRESSION: 1. Findings suggesting mild sigmoid colitis or diverticulitis. 2. Small amount of free peritoneal fluid. 3. Small bilateral pleural effusions. 4. Small pericardial effusion. 5. 3.2 cm exophytic uterine fibroid. 6. Mild right basilar and minimal left basilar patchy atelectasis or pneumonia. Electronically Signed   By: Claudie Revering M.D.   On: 08/29/2016 20:54    Chart has been reviewed    Assessment/Plan  46 y.o. female with medical history significant of DM2, HLD, HTN, hx of ICH and multiple strokes, diastolic CHF, anemia  Admitted for Diverticulitis in the setting of Hypoglycemia Present on Admission:    . Diverticulitis/COlitis - admit for IV antibiotics (zosyn) and rehydration, Clear liquid diet and advance as able . Essential hypertension - Continue home medications.  . Hyperlipidemia - stable continue home medications . OSA (obstructive sleep apnea) . Type 2 diabetes mellitus with other circulatory complications (HCC) - Hold home insulin for tonight given hypoglycemia and decreased PO intake ,Order sliding scale . Hypoglycemia due to insulin - frequent CBG's hold home dose of insulin Constipation - bowel regimen CKD  - at baseline avoid nephrotoxic medications . Diastolic dysfunction - - currently appears to be slightly on the dry side, hold home diuretics for tonight and restart when appears euvolemic, carefuly follow fluid status and Cr Abnormal CT - clinically no evidence of PNA Other plan as per orders.  DVT prophylaxis:  SCD   Code Status:  FULL CODE as per prior reccords  Family Communication:   Family not  at  Bedside    Disposition Plan:     To home once workup is complete and patient is stable                      Diabetes coordinator    consulted                          Consults called: none   Admission status:    obs   Level of  care    medical floor           I have spent a total of  56 min on this admission  Damiel Barthold 08/29/2016, 10:31 PM    [facility]  Pager [phone]   after 2 AM please page floor coverage PA If 7AM-7PM, please contact the day team taking care of the patient  [email or web address]  Password [email or web address]

## 2016-08-29 NOTE — ED Notes (Signed)
This RN spoke with Dr Judd Lienelo and he ordered to start a line on pt and give D50.

## 2016-08-29 NOTE — Telephone Encounter (Signed)
Pt lvm at 8:37 to make provider aware that she will not be at her apt today.

## 2016-08-29 NOTE — ED Notes (Signed)
Pt was seen here yesterday for hypoglycemia, while this RN was triaging pt, pt appeared a little sleepy but alert and oriented. CBG checked and 58, pt given orange juice and graham crackers.

## 2016-08-30 ENCOUNTER — Ambulatory Visit: Payer: Self-pay | Admitting: Family Medicine

## 2016-08-30 DIAGNOSIS — K5792 Diverticulitis of intestine, part unspecified, without perforation or abscess without bleeding: Secondary | ICD-10-CM | POA: Diagnosis not present

## 2016-08-30 DIAGNOSIS — Z794 Long term (current) use of insulin: Secondary | ICD-10-CM | POA: Diagnosis not present

## 2016-08-30 DIAGNOSIS — E1159 Type 2 diabetes mellitus with other circulatory complications: Secondary | ICD-10-CM

## 2016-08-30 LAB — COMPREHENSIVE METABOLIC PANEL
ALBUMIN: 3.4 g/dL — AB (ref 3.5–5.0)
ALT: 20 U/L (ref 14–54)
AST: 19 U/L (ref 15–41)
Alkaline Phosphatase: 67 U/L (ref 38–126)
Anion gap: 10 (ref 5–15)
BUN: 12 mg/dL (ref 6–20)
CALCIUM: 8.7 mg/dL — AB (ref 8.9–10.3)
CO2: 22 mmol/L (ref 22–32)
Chloride: 107 mmol/L (ref 101–111)
Creatinine, Ser: 1.09 mg/dL — ABNORMAL HIGH (ref 0.44–1.00)
GFR calc Af Amer: >60 mL/min (ref >60)
GFR calc non Af Amer: 59 mL/min — ABNORMAL LOW (ref >60)
GLUCOSE: 158 mg/dL — AB (ref 65–99)
POTASSIUM: 3.4 mmol/L — AB (ref 3.5–5.1)
Sodium: 139 mmol/L (ref 135–145)
TOTAL PROTEIN: 6.3 g/dL — AB (ref 6.5–8.1)
Total Bilirubin: 0.4 mg/dL (ref 0.3–1.2)

## 2016-08-30 LAB — HIV ANTIBODY (ROUTINE TESTING W REFLEX): HIV Screen 4th Generation wRfx: NONREACTIVE

## 2016-08-30 LAB — GLUCOSE, CAPILLARY
GLUCOSE-CAPILLARY: 155 mg/dL — AB (ref 65–99)
GLUCOSE-CAPILLARY: 164 mg/dL — AB (ref 65–99)
Glucose-Capillary: 105 mg/dL — ABNORMAL HIGH (ref 65–99)
Glucose-Capillary: 186 mg/dL — ABNORMAL HIGH (ref 65–99)

## 2016-08-30 LAB — CBC
HCT: 36.5 % (ref 36.0–46.0)
Hemoglobin: 11.8 g/dL — ABNORMAL LOW (ref 12.0–15.0)
MCH: 26.9 pg (ref 26.0–34.0)
MCHC: 32.3 g/dL (ref 30.0–36.0)
MCV: 83.3 fL (ref 78.0–100.0)
Platelets: 239 10*3/uL (ref 150–400)
RBC: 4.38 MIL/uL (ref 3.87–5.11)
RDW: 12.9 % (ref 11.5–15.5)
WBC: 12.5 10*3/uL — ABNORMAL HIGH (ref 4.0–10.5)

## 2016-08-30 LAB — MAGNESIUM: Magnesium: 1.8 mg/dL (ref 1.7–2.4)

## 2016-08-30 LAB — PHOSPHORUS: PHOSPHORUS: 3.1 mg/dL (ref 2.5–4.6)

## 2016-08-30 LAB — TSH: TSH: 0.969 u[IU]/mL (ref 0.350–4.500)

## 2016-08-30 MED ORDER — AMOXICILLIN-POT CLAVULANATE 875-125 MG PO TABS: 1.0000 | ORAL_TABLET | Freq: Two times a day (BID) | ORAL | 0 refills | Status: AC

## 2016-08-30 MED ORDER — POLYETHYLENE GLYCOL 3350 17 G PO PACK: 17.0000 g | PACK | Freq: Every day | ORAL | 0 refills | Status: DC

## 2016-08-30 MED ORDER — DOCUSATE SODIUM 100 MG PO CAPS: 100.0000 mg | ORAL_CAPSULE | Freq: Two times a day (BID) | ORAL | 1 refills | Status: DC

## 2016-08-30 NOTE — Discharge Summary (Signed)
Physician Discharge Summary  Stephanie Lloyd MWN:[id number] DOB: 01-Apr-1970 DOA: 08/29/2016  PCP: Ann Held, DO  Admit date: 08/29/2016 Discharge date: 08/30/2016  Admitted From:home Disposition:home  Recommendations for Outpatient Follow-up:  1. Follow up with PCP in 1-2 weeks 2. Please obtain BMP/CBC in one week  Home Health:no Equipment/Devices:no Discharge Condition:stable CODE STATUS:full Diet recommendation:carb modified heart healthy  Brief/Interim Summary: 46 year old female with history of type 2 diabetes, hyperlipidemia, hypertension, prior history of a stroke, chronic diastolic congestive heart failure presented with constipation for 4 days associated with generalized fatigue. In the ER patient was found to have mild sigmoid colitis/diverticulitis without complication. Treated with IV antibiotics and admitted for close observation. Leukocytosis improved. Patient reported that she has no nausea vomiting or abdominal pain. Tolerating diet well. Discharging with MiraLAX and Colace for the management of constipation. Patient was educated for hydration, fibers food and stool softener for the management of constipation. Her abdomen exam is benign. She is able to ambulate. On admission she was found to have borderline low blood sugar level/hypoglycemia. Patient reported poor oral intake yesterday. Now patient is able to tolerate diet well. Blood sugar level is better controlled. I advised patient to monitor blood sugar level and follow-up with PCP. I'm discharging patient with tenderness of oral Augmentin for possible colitis/mild diverticulitis without complication. Resume all medication on discharge. Patient was recommended to follow up with PCP or her GYN for the evaluation of uterine fibroid.  Discharge Diagnoses:  Active Problems:   Essential hypertension   Diabetes mellitus ([place])   Type 2 diabetes mellitus with other circulatory complications (HCC)   Hyperlipidemia    Acute kidney injury ([place])   Diastolic dysfunction   History of intracerebral hemorrhage without residual deficit   OSA (obstructive sleep apnea)   Hypoglycemia due to insulin   Diverticulosis   CKD (chronic kidney disease), stage II   Diverticulitis    Discharge Instructions  Discharge Instructions    Call MD for:  difficulty breathing, headache or visual disturbances    Complete by:  As directed    Call MD for:  extreme fatigue    Complete by:  As directed    Call MD for:  hives    Complete by:  As directed    Call MD for:  persistant dizziness or light-headedness    Complete by:  As directed    Call MD for:  persistant nausea and vomiting    Complete by:  As directed    Call MD for:  severe uncontrolled pain    Complete by:  As directed    Call MD for:  temperature >100.4    Complete by:  As directed    Diet - low sodium heart healthy    Complete by:  As directed    Diet Carb Modified    Complete by:  As directed    Discharge instructions    Complete by:  As directed    Monitor blood sugar level at home and follow up with PCP. Please follow up with her PCP or GYN for uterine fibroid.   Increase activity slowly    Complete by:  As directed      Allergies as of 08/30/2016      Reactions   Metformin And Related Diarrhea      Medication List    STOP taking these medications   atorvastatin 80 MG tablet Commonly known as:  LIPITOR     TAKE these medications   ACCU-CHEK FASTCLIX LANCETS  Misc Check blood sugar twice daily. Dx E:11.9 What changed:  additional instructions   ACCU-CHEK GUIDE w/Device Kit 1 Device by Does not apply route as directed. Check blood sugar daily   amLODipine 10 MG tablet Commonly known as:  NORVASC Take 1 tablet (10 mg total) by mouth daily.   amoxicillin-clavulanate 875-125 MG tablet Commonly known as:  AUGMENTIN Take 1 tablet by mouth every 12 (twelve) hours.   aspirin 325 MG EC tablet Take 1 tablet (325 mg total) by mouth  daily.   docusate sodium 100 MG capsule Commonly known as:  COLACE Take 1 capsule (100 mg total) by mouth 2 (two) times daily.   glucose blood test strip Commonly known as:  ACCU-CHEK GUIDE Check blood sugar twice daily. Dx E:11.9   imiquimod 5 % cream Commonly known as:  ALDARA Apply topically 3 (three) times a week. Apply until total clearance or maximum of 16 weeks   insulin aspart protamine - aspart (70-30) 100 UNIT/ML FlexPen Commonly known as:  NOVOLOG 70/30 MIX 20 u sq q am and 10u sq qpm What changed:  how much to take  how to take this  when to take this  additional instructions   Insulin Pen Needle 31G X 5 MM Misc Commonly known as:  B-D UF III MINI PEN NEEDLES To use with insulin   lisinopril 40 MG tablet Commonly known as:  PRINIVIL,ZESTRIL Take 1 tablet (40 mg total) by mouth daily.   polyethylene glycol packet Commonly known as:  MIRALAX / GLYCOLAX Take 17 g by mouth daily.   prednisoLONE acetate 1 % ophthalmic suspension Commonly known as:  PRED FORTE Place 1 drop into both eyes 2 (two) times daily.   VITAMIN B-12 PO Take 1 tablet by mouth daily with breakfast.   VITAMIN B-6 PO Take 1 tablet by mouth daily with breakfast.   Vitamin D (Ergocalciferol) 50000 units Caps capsule Commonly known as:  DRISDOL Take 1 capsule (50,000 Units total) by mouth every 7 (seven) days.      Follow-up Information    Ann Held, DO. Schedule an appointment as soon as possible for a visit today.   Specialty:  Family Medicine Contact information: [address] [place] [address] [phone]          Allergies  Allergen Reactions  . Metformin And Related Diarrhea    Consultations: None  Procedures/Studies: CT abdomen pelvis  Subjective: Patient was seen and examined at bedside. Reported feeling good. Patient denied headache, dizziness, nausea vomiting chest pain shortness of breath. Able to tolerate diet  well.  Discharge Exam: Vitals:   08/30/16 0436 08/30/16 1017  BP: (!) 165/82 134/82  Pulse: 66 71  Resp: 18 18  Temp: 97.9 F (36.6 C) 98.3 F (36.8 C)   Vitals:   08/29/16 2230 08/29/16 2307 08/30/16 0436 08/30/16 1017  BP: (!) 164/99 (!) 161/105 (!) 165/82 134/82  Pulse:  83 66 71  Resp: (!) '22 19 18 18  ' Temp:  97.8 F (36.6 C) 97.9 F (36.6 C) 98.3 F (36.8 C)  TempSrc:  Oral  Oral  SpO2:  98% 100% 100%  Weight:  83.9 kg (184 lb 14.4 oz)    Height:  '5\' 10"'  (1.778 m)      General: Pt is alert, awake, not in acute distress Cardiovascular: RRR, S1/S2 +, no rubs, no gallops Respiratory: CTA bilaterally, no wheezing, no rhonchi Abdominal: Soft, NT, ND, bowel sounds + Extremities: no edema, no  cyanosis    The results of significant diagnostics from this hospitalization (including imaging, microbiology, ancillary and laboratory) are listed below for reference.     Microbiology: No results found for this or any previous visit (from the past 240 hour(s)).   Labs: BNP (last 3 results) No results for input(s): BNP in the last 8760 hours. Basic Metabolic Panel:  Recent Labs Lab 08/28/16 1022 08/29/16 1527 08/30/16 0420  NA 142 140 139  K 3.9 4.3 3.4*  CL 105 105 107  CO2  --  26 22  GLUCOSE 191* 109* 158*  BUN 26* 17 12  CREATININE 1.20* 1.19* 1.09*  CALCIUM  --  9.9 8.7*  MG  --   --  1.8  PHOS  --   --  3.1   Liver Function Tests:  Recent Labs Lab 08/29/16 1527 08/30/16 0420  AST 29 19  ALT 29 20  ALKPHOS 88 67  BILITOT 0.5 0.4  PROT 8.4* 6.3*  ALBUMIN 4.5 3.4*   No results for input(s): LIPASE, AMYLASE in the last 168 hours. No results for input(s): AMMONIA in the last 168 hours. CBC:  Recent Labs Lab 08/28/16 1011 08/28/16 1022 08/29/16 1527 08/30/16 0420  WBC 10.6*  --  18.1* 12.5*  NEUTROABS 8.8*  --  16.6*  --   HGB 13.2 15.0 14.1 11.8*  HCT 41.6 44.0 42.5 36.5  MCV 84.7  --  84.2 83.3  PLT 236  --  261 239   Cardiac  Enzymes: No results for input(s): CKTOTAL, CKMB, CKMBINDEX, TROPONINI in the last 168 hours. BNP: Invalid input(s): POCBNP CBG:  Recent Labs Lab 08/29/16 1757 08/29/16 2238 08/30/16 0021 08/30/16 0403 08/30/16 0737  GLUCAP 102* 175* 186* 155* 164*   D-Dimer No results for input(s): DDIMER in the last 72 hours. Hgb A1c No results for input(s): HGBA1C in the last 72 hours. Lipid Profile No results for input(s): CHOL, HDL, LDLCALC, TRIG, CHOLHDL, LDLDIRECT in the last 72 hours. Thyroid function studies  Recent Labs  08/30/16 0420  TSH 0.969   Anemia work up No results for input(s): VITAMINB12, FOLATE, FERRITIN, TIBC, IRON, RETICCTPCT in the last 72 hours. Urinalysis    Component Value Date/Time   COLORURINE STRAW (A) 08/29/2016 2130   APPEARANCEUR CLEAR 08/29/2016 2130   LABSPEC 1.018 08/29/2016 2130   PHURINE 7.0 08/29/2016 2130   GLUCOSEU >=500 (A) 08/29/2016 2130   HGBUR SMALL (A) 08/29/2016 2130   BILIRUBINUR NEGATIVE 08/29/2016 2130   BILIRUBINUR neg 03/06/2016 1619   KETONESUR NEGATIVE 08/29/2016 2130   PROTEINUR NEGATIVE 08/29/2016 2130   UROBILINOGEN 1.0 03/06/2016 1619   UROBILINOGEN 0.2 12/12/2011 1135   NITRITE NEGATIVE 08/29/2016 2130   LEUKOCYTESUR LARGE (A) 08/29/2016 2130   Sepsis Labs Invalid input(s): PROCALCITONIN,  WBC,  LACTICIDVEN Microbiology No results found for this or any previous visit (from the past 240 hour(s)).   Time coordinating discharge: 30 minutes  SIGNED:   Rosita Fire, MD  [facility] 08/30/2016, 12:15 PM  If 7PM-7AM, please contact night-coverage [email or web address] Password [email or web address]

## 2016-08-31 LAB — HEMOGLOBIN A1C
HEMOGLOBIN A1C: 7.2 % — AB (ref 4.8–5.6)
MEAN PLASMA GLUCOSE: 160 mg/dL

## 2016-09-02 ENCOUNTER — Telehealth: Payer: Self-pay | Admitting: Behavioral Health

## 2016-09-02 NOTE — Telephone Encounter (Signed)
Unable to reach patient for TCM/Hospital Follow-up call. Left message for patient to return call when available.    

## 2016-09-02 NOTE — Telephone Encounter (Signed)
Transition Care Management Follow-up Telephone Call  PCP: Donato SchultzLowne Chase, [name], DO  Admit date: 08/29/2016 Discharge date: 08/30/2016  Admitted From:home Disposition:home  Recommendations for Outpatient Follow-up:  1. Follow up with PCP in 1-2 weeks 2. Please obtain BMP/CBC in one week  Home Health:no Equipment/Devices:no Discharge Condition:stable   How have you been since you were released from the hospital? Patient stated, "I'm doing ok".   Do you understand why you were in the hospital? yes, pt. voiced "bowel impaction, colon infection & low blood sugar".   Do you understand the discharge instructions? yes   Where were you discharged to? Home   Items Reviewed:  Medications reviewed: yes  Allergies reviewed: yes  Dietary changes reviewed: yes, carb modified/heart healthy diet  Referrals reviewed: yes, Follow up with PCP in 1-2 weeks; Please obtain BMP/CBC in one week   Functional Questionnaire:   Activities of Daily Living (ADLs):   She states they are independent in the following: ambulation, bathing and hygiene, feeding, continence, grooming, toileting and dressing States they require assistance with the following: None   Any transportation issues/concerns?: no   Any patient concerns? no   Confirmed importance and date/time of follow-up visits scheduled yes, 09/06/16 at 11:15 AM.  Provider Appointment booked with Dr. Carmelia RollerWendling.  Confirmed with patient if condition begins to worsen call PCP or go to the ER.  Patient was given the office number and encouraged to call back with question or concerns.  : yes

## 2016-09-06 ENCOUNTER — Encounter: Payer: Self-pay | Admitting: Family Medicine

## 2016-09-06 ENCOUNTER — Ambulatory Visit (INDEPENDENT_AMBULATORY_CARE_PROVIDER_SITE_OTHER): Payer: Medicaid Other | Admitting: Family Medicine

## 2016-09-06 VITALS — BP 132/78 | HR 69 | Temp 97.7°F | Resp 18 | Ht 70.0 in | Wt 187.2 lb

## 2016-09-06 DIAGNOSIS — I69392 Facial weakness following cerebral infarction: Secondary | ICD-10-CM | POA: Diagnosis not present

## 2016-09-06 DIAGNOSIS — R29810 Facial weakness: Secondary | ICD-10-CM

## 2016-09-06 DIAGNOSIS — Z794 Long term (current) use of insulin: Secondary | ICD-10-CM | POA: Diagnosis not present

## 2016-09-06 DIAGNOSIS — E1159 Type 2 diabetes mellitus with other circulatory complications: Secondary | ICD-10-CM | POA: Diagnosis not present

## 2016-09-06 DIAGNOSIS — Z09 Encounter for follow-up examination after completed treatment for conditions other than malignant neoplasm: Secondary | ICD-10-CM

## 2016-09-06 LAB — BASIC METABOLIC PANEL
BUN: 20 mg/dL (ref 6–23)
CALCIUM: 9.7 mg/dL (ref 8.4–10.5)
CO2: 30 mEq/L (ref 19–32)
Chloride: 102 mEq/L (ref 96–112)
Creatinine, Ser: 1.21 mg/dL — ABNORMAL HIGH (ref 0.40–1.20)
GFR: 60.98 mL/min (ref >60.00)
Glucose, Bld: 156 mg/dL — ABNORMAL HIGH (ref 70–99)
Potassium: 3.9 mEq/L (ref 3.5–5.1)
SODIUM: 138 meq/L (ref 135–145)

## 2016-09-06 LAB — CBC
HEMATOCRIT: 37.4 % (ref 36.0–46.0)
Hemoglobin: 12.2 g/dL (ref 12.0–15.0)
MCHC: 32.5 g/dL (ref 30.0–36.0)
MCV: 85.5 fl (ref 78.0–100.0)
Platelets: 220 10*3/uL (ref 150.0–400.0)
RBC: 4.38 Mil/uL (ref 3.87–5.11)
RDW: 13.1 % (ref 11.5–15.5)
WBC: 7 10*3/uL (ref 4.0–10.5)

## 2016-09-06 MED ORDER — PIOGLITAZONE HCL 30 MG PO TABS: 30.0000 mg | ORAL_TABLET | Freq: Every day | ORAL | 2 refills | Status: DC

## 2016-09-06 NOTE — Patient Instructions (Addendum)
Take 10 units of your insulin in the morning and take 5 units in the evening.  If you are still having low sugars, decrease to 6 units in the morning and 3 units in the evening. Let me know on Monday how you are doing and what you are taking.   Keep up the great work with your diet and exercising.  If you do not hear anything about your referral in the next 1-2 weeks, call our office and ask for an update.  Let us know if you need anything.

## 2016-09-06 NOTE — Progress Notes (Signed)
Chief Complaint  Patient presents with  . Hospitalization Follow-up    Patient wants to know if she can remove her insulin off of her medicaiton list. She states she no longer wants to take them.     HPI Stephanie Lloyd is a 46 y.o. y.o. female who presents for a transition of care visit.  Pt was discharged from [facility] on 08/30/16.  Within 48 business hours of discharge our office contacted her via telephone to coordinate her care and needs. She is here with her husband.  The patient was admitted on 08/29/16 to [facility] for colitis/diverticulitis and hypoglycemia. She was treated with fluids and IV antibiotics, she subsequently improved. Over the past month, the patient is been having more episodes of hypoglycemia and subsequent confusion. She takes 20 units of 70/30 insulin in the morning and 10 units of the same in the evening. She's been getting more low readings in the morning but some in the evening. She has lost weight since starting on her current dosage. She is failed metformin and Jardine's in the past. She has been on Actos, however does not remember why she stopped taking it. She is not taking any other oral medication. She does reaffirm that she has type 2 diabetes. She is eating well and not having any abdominal pain, fevers, or bowel changes.  Her husband also stated that she has been having difficulty keeping an saliva and secretions when she sneezes. She has a history of a hemorrhagic stroke and subsequent facial droop.  Social History   Social History  . Marital status: Married   Social History Main Topics  . Smoking status: Never Smoker  . Smokeless tobacco: Never Used  . Alcohol use No     Comment: occ  . Drug use: No  . Sexual activity: Yes    Birth control/ protection: None   Past Medical History:  Diagnosis Date  . Diabetes ([place])   . Diabetes mellitus without complication ([place])   . Diastolic dysfunction   . Hypertension   . Immune deficiency disorder ([place])    . Menometrorrhagia 04/01/2016  . Stroke [facility])    Family History  Problem Relation Age of Onset  . Diabetes Father   . Hypertension Father   . Stroke Father   . Diabetes Paternal Uncle   . Stroke Paternal Uncle   . Stroke Brother   . Cancer Neg Hx   . Breast cancer Neg Hx   . Ovarian cancer Neg Hx    Allergies as of 09/06/2016      Reactions   Metformin And Related Diarrhea      Medication List       Accurate as of 09/06/16 12:24 PM. Always use your most recent med list.          ACCU-CHEK FASTCLIX LANCETS Misc Check blood sugar twice daily. Dx E:11.9   ACCU-CHEK GUIDE w/Device Kit 1 Device by Does not apply route as directed. Check blood sugar daily   amLODipine 10 MG tablet Commonly known as:  NORVASC Take 1 tablet (10 mg total) by mouth daily.   amoxicillin-clavulanate 875-125 MG tablet Commonly known as:  AUGMENTIN Take 1 tablet by mouth every 12 (twelve) hours.   aspirin 325 MG EC tablet Take 1 tablet (325 mg total) by mouth daily.   docusate sodium 100 MG capsule Commonly known as:  COLACE Take 1 capsule (100 mg total) by mouth 2 (two) times daily.   glucose blood test strip Commonly known  as:  ACCU-CHEK GUIDE Check blood sugar twice daily. Dx E:11.9   imiquimod 5 % cream Commonly known as:  ALDARA Apply topically 3 (three) times a week. Apply until total clearance or maximum of 16 weeks   insulin aspart protamine - aspart (70-30) 100 UNIT/ML FlexPen Commonly known as:  NOVOLOG 70/30 MIX 10 u sq q am and 5u sq qpm   Insulin Pen Needle 31G X 5 MM Misc Commonly known as:  B-D UF III MINI PEN NEEDLES To use with insulin   lisinopril 40 MG tablet Commonly known as:  PRINIVIL,ZESTRIL Take 1 tablet (40 mg total) by mouth daily.   pioglitazone 30 MG tablet Commonly known as:  ACTOS Take 1 tablet (30 mg total) by mouth daily.   polyethylene glycol packet Commonly known as:  MIRALAX / GLYCOLAX Take 17 g by mouth daily.   prednisoLONE acetate 1 %  ophthalmic suspension Commonly known as:  PRED FORTE Place 1 drop into both eyes 2 (two) times daily.   VITAMIN B-12 PO Take 1 tablet by mouth daily with breakfast.   VITAMIN B-6 PO Take 1 tablet by mouth daily with breakfast.   Vitamin D (Ergocalciferol) 50000 units Caps capsule Commonly known as:  DRISDOL Take 1 capsule (50,000 Units total) by mouth every 7 (seven) days.       ROS:  Constitutional: No fevers or chills, no weight loss HEENT: No headaches, hearing loss, or runny nose, no sore throat Heart: No chest pain Lungs: No SOB, no cough Abd: No bowel changes, no pain, no N/V GU: No urinary complaints Neuro: No numbness, tingling or weakness Msk: No joint or muscle pain  Objective BP 132/78 (BP Location: Left Arm, Patient Position: Sitting, Cuff Size: Normal)   Pulse 69   Temp 97.7 F (36.5 C) (Oral)   Resp 18   Ht _0  (1.778 m)   Wt 187 lb 3.2 oz (84.9 kg)   SpO2 99%   BMI 26.86 kg/m  General Appearance:  awake, alert, oriented, in no acute distress and well developed, well nourished Skin:  there are no suspicious lesions or rashes of concern Head/face:  NCAT Eyes:  EOMI, PERRLA Ears:  canals and TMs NI Nose/Sinuses:  negative Mouth/Throat:  Mucosa moist, no lesions; pharynx without erythema, edema or exudate. Neck:  neck- supple, no mass, non-tender and no jvd Lungs: Clear to auscultation.  No rales, rhonchi, or wheezing. Normal effort, no accessory muscle use. Heart:  Heart sounds are normal.  Regular rate and rhythm, 2/6 SEM heard loudest at aortic listening post, no gallop or rub. No LE edema Abdomen:  BS+, soft, NT, ND, no masses or organomegaly Musculoskeletal:  No muscle group atrophy or asymmetry, gait normal Neurologic:  Alert and oriented x 3, gait normal., Fluent speech, +facial droop, no cerebellar signs Psych exam: Nml mood and affect, age appropriate judgment and insight  Hospital discharge follow-up - Plan: pioglitazone (ACTOS) 30 MG  tablet, CBC, Basic metabolic panel  Type 2 diabetes mellitus with other circulatory complications (HCC) - Plan: insulin aspart protamine - aspart (NOVOLOG 70/30 MIX) (70-30) 100 UNIT/ML FlexPen  Mouth droop due to facial weakness - Plan: Ambulatory referral to Speech Therapy  Discharge summary and medication list have been reviewed/reconciled.  Labs pending at the time of discharge have been reviewed or are still pending at the time of this visit.  Follow-up labs and appointments have been ordered and/or coordinated appropriately. Educational materials regarding the patient's admitting diagnosis provided.  TRANSITIONAL CARE MANAGEMENT  CERTIFICATION:  I certify the following are true:   1. Communication with the patient/care giver was made within 2 business days of discharge.  2. Complexity of Medical decision making is high.  3. Face to face visit occurred within 7 days of discharge.   I will decrease her insulin to 10 units in the morning and 5 units in the evening. I would rather have her run high than low. I will add Actos in the meantime. Counseled on diet and exercise, sounds like she is doing a good job. Continue Augmentin. It appears she is clinically improving. For the issue with her saliva, my guess is that her oral muscles are not sufficiently strong to hold in her secretions. This is likely a sequela from her stroke, will refer to SLP to help with strengthening. F/u in 4 weeks to recheck sugars with regular PCP. The patient and her spouse voiced understanding and agreement to the plan.  [place], DO 09/06/16 12:24 PM

## 2016-09-09 ENCOUNTER — Telehealth: Payer: Self-pay | Admitting: Family Medicine

## 2016-09-09 NOTE — Telephone Encounter (Signed)
Caller name: Gypsy BalsamMarva Mortell Relationship to patient: self Can be reached: [phone]  Reason for call: Pt called to notify Dr. Carmelia RollerWendling that there has been no problems with changes to insulin medication. She was unable to log in to [employer]. Her computer is not working well.

## 2016-09-16 ENCOUNTER — Ambulatory Visit: Payer: Self-pay

## 2016-09-16 ENCOUNTER — Ambulatory Visit (INDEPENDENT_AMBULATORY_CARE_PROVIDER_SITE_OTHER): Payer: Medicaid Other | Admitting: Family Medicine

## 2016-09-16 ENCOUNTER — Encounter: Payer: Self-pay | Admitting: Family Medicine

## 2016-09-16 VITALS — BP 122/80 | HR 60 | Wt 187.0 lb

## 2016-09-16 DIAGNOSIS — M7500 Adhesive capsulitis of unspecified shoulder: Secondary | ICD-10-CM

## 2016-09-16 DIAGNOSIS — M25511 Pain in right shoulder: Principal | ICD-10-CM

## 2016-09-16 DIAGNOSIS — G8929 Other chronic pain: Secondary | ICD-10-CM

## 2016-09-16 DIAGNOSIS — E11618 Type 2 diabetes mellitus with other diabetic arthropathy: Secondary | ICD-10-CM | POA: Diagnosis not present

## 2016-09-16 NOTE — Progress Notes (Signed)
[facility] [address] [place], [place] [address] Phone: [phone] Subjective:    I'm seeing this patient by the request  of:    CC: Right arm pain  [id number]  Stephanie Lloyd is a 46 y.o. female coming in with complaint of right hand pain and weakness. States that this pain is been going on for 3 months. Does not remember any true injury but unfortunately did have a cerebral vascular accident and has multiple ones previously. Patient was admitted September 2017 as well as then again in 02/28/2016 and since then unfortunately is having worsening weakness of the right side. At that time patient did have an MRI that showed a left. Median pontine infarct As well as significant ischemic changes.  Patient was seen by me and did have what appeared to be more weakness secondary to the upper extremity but also of what appeared to be frozen shoulder. Patient was to do physical therapy 1 time. Unfortunately continues to have pain in that still has limited range of motion. Some mild increase in popping that is painful. Denies any radiation down the arm at this time. Patient though states that it is waking her up at night.     Past Medical History:  Diagnosis Date  . Diabetes (HCC)   . Diabetes mellitus without complication (HCC)   . Diastolic dysfunction   . Hypertension   . Immune deficiency disorder (HCC)   . Menometrorrhagia 04/01/2016  . Stroke [facility])    Past Surgical History:  Procedure Laterality Date  . NO PAST SURGERIES     Social History   Social History  . Marital status: Married    Spouse name: N/A  . Number of children: N/A  . Years of education: N/A   Social History Main Topics  . Smoking status: Never Smoker  . Smokeless tobacco: Never Used  . Alcohol use No     Comment: occ  . Drug use: No  . Sexual activity: Yes    Birth control/ protection: None   Other Topics Concern  . None   Social History Narrative   ** Merged History  Encounter **       Allergies  Allergen Reactions  . Metformin And Related Diarrhea   Family History  Problem Relation Age of Onset  . Diabetes Father   . Hypertension Father   . Stroke Father   . Diabetes Paternal Uncle   . Stroke Paternal Uncle   . Stroke Brother   . Cancer Neg Hx   . Breast cancer Neg Hx   . Ovarian cancer Neg Hx     Past medical history, social, surgical and family history all reviewed in electronic medical record.  No pertanent information unless stated regarding to the chief complaint.   Review of Systems: No headache, visual changes, nausea, vomiting, diarrhea, constipation, dizziness, abdominal pain, skin rash, fevers, chills, night sweats, weight loss, swollen lymph nodes,, chest pain, shortness of breath, mood changes.  Positive muscle aches, body aches  Objective  Blood pressure 122/80, pulse 60, weight 187 lb (84.8 kg).   General: No apparent distress alert and oriented x3 mood and affect normal, dressed appropriately. Does have some weakness on the right side of her body. Slurred speech noted.About the same as previous exam HEENT: Pupils equal, extraocular movements intact  Respiratory: Patient's speak in full sentences and does not appear short of breath  Cardiovascular: Trace lower extremity edema, non tender, no erythema  Skin: Warm dry  intact with no signs of infection or rash on extremities or on axial skeleton.  Abdomen: Soft nontender  Neuro: Cranial nerves II through XII are intact, neurovascularly intact in all extremities with 2+ DTRs and 2+ pulses.  Lymph: No lymphadenopathy of posterior or anterior cervical chain or axillae bilaterally.  Gait antalgic gait still noted secondary to hemiparesis MSK:  Non tender with full range of motion and good stability and strength and tone of  elbows, wrist, hip, knee and ankles bilaterally. Patient does have some weakness of the right upper extremity stable from previous exam Shoulder:  Right Inspection reveals no abnormalities, atrophy or asymmetry. Palpation is normal with no tenderness over AC joint or bicipital groove. Continued limited range of motion lacking the last 10 of external rotation and lateral hip is as far as with internal rotation. Patient does lack the last 10 of forward flexion Rotator cuff strength 4 out of 5 compared to contralateral sign signs of impingement with positive Neer and Hawkin's tests, but negative empty can sign. Speeds and Yergason's tests normal. No labral pathology noted with negative Obrien's, negative clunk and good stability. Normal scapular function observed. Weakness noted. No apprehension sign  Procedure: Real-time Ultrasound Guided Injection of right glenohumeral joint Device: GE Logiq Q7  Ultrasound guided injection is preferred based studies that show increased duration, increased effect, greater accuracy, decreased procedural pain, increased response rate with ultrasound guided versus blind injection.  Verbal informed consent obtained.  Time-out conducted.  Noted no overlying erythema, induration, or other signs of local infection.  Skin prepped in a sterile fashion.  Local anesthesia: Topical Ethyl chloride.  With sterile technique and under real time ultrasound guidance:  Joint visualized.  23g 1  inch needle inserted posterior approach. Pictures taken for needle placement. Patient did have injection of 2 cc of 1% lidocaine, 2 cc of 0.5% Marcaine, and 1.0 cc of Kenalog 40 mg/dL. Completed without difficulty  Pain immediately resolved suggesting accurate placement of the medication.  Advised to call if fevers/chills, erythema, induration, drainage, or persistent bleeding.  Images permanently stored and available for review in the ultrasound unit.  Impression: Technically successful ultrasound guided injection.    Impression and Recommendations:     This case required medical decision making of moderate  complexity.      Note: This dictation was prepared with Dragon dictation along with smaller phrase technology. Any transcriptional errors that result from this process are unintentional.

## 2016-09-16 NOTE — Patient Instructions (Addendum)
Good to see you  Stephanie Lloyd is your friend.  Keep making sure you are moving it  Start my exercises again in 3 days and do the me 3 times a week.  Continue the vitamins See em again in 6 weeks and we will see if we have to do another one.  Watch blood sugars a little closer today and next 3 days.

## 2016-09-16 NOTE — Assessment & Plan Note (Signed)
Worsening Patient given injection. We'll monitor blood sugars closely. HEP and encouraged PT.  Discussed icing  Stay active.   RTC in 6 weeks.

## 2016-09-27 ENCOUNTER — Telehealth: Payer: Self-pay | Admitting: Family Medicine

## 2016-09-27 NOTE — Telephone Encounter (Addendum)
Relation to UE:AVWUpt:self Call back [phone]   Reason for call:   Spouse requesting letter for [employer] insurance stating patient needs to continue physical therapy and would like frequency to increase to 1x daily, please advise

## 2016-09-27 NOTE — Telephone Encounter (Signed)
Please advise 

## 2016-09-27 NOTE — Telephone Encounter (Signed)
Ok to extend pt

## 2016-10-01 ENCOUNTER — Telehealth: Payer: Self-pay | Admitting: Family Medicine

## 2016-10-01 MED ORDER — FLUTICASONE PROPIONATE 50 MCG/ACT NA SUSP: 2.0000 | Freq: Every day | NASAL | 1 refills | Status: DC

## 2016-10-01 NOTE — Telephone Encounter (Signed)
Please advise 

## 2016-10-01 NOTE — Telephone Encounter (Signed)
Sent in flonase/patient notified request sent in.

## 2016-10-01 NOTE — Telephone Encounter (Signed)
°  Relation to WG:NFAOpt:self Call back [phone] Pharmacy: [facility] [address] [place], [place], [place] [address] [phone]   Reason for call:  -[facility] [address], [place], [place] [address] [phone]

## 2016-10-03 NOTE — Telephone Encounter (Signed)
Noted. Letter sent via Mychart.

## 2016-10-08 ENCOUNTER — Ambulatory Visit: Payer: Self-pay | Admitting: Family Medicine

## 2016-10-08 ENCOUNTER — Telehealth: Payer: Self-pay | Admitting: Family Medicine

## 2016-10-08 NOTE — Telephone Encounter (Signed)
°  Reason for call:   Patient cancelled 11:15am appointment for today [facility] charge or no charge

## 2016-10-08 NOTE — Telephone Encounter (Signed)
charge 

## 2016-10-11 ENCOUNTER — Ambulatory Visit: Payer: Medicaid Other | Admitting: Nurse Practitioner

## 2016-10-17 ENCOUNTER — Encounter: Payer: Self-pay | Admitting: Family Medicine

## 2016-10-17 ENCOUNTER — Ambulatory Visit: Payer: Self-pay | Admitting: Family Medicine

## 2016-10-17 ENCOUNTER — Ambulatory Visit (INDEPENDENT_AMBULATORY_CARE_PROVIDER_SITE_OTHER): Payer: Medicaid Other | Admitting: Family Medicine

## 2016-10-17 DIAGNOSIS — I10 Essential (primary) hypertension: Secondary | ICD-10-CM

## 2016-10-17 DIAGNOSIS — E785 Hyperlipidemia, unspecified: Secondary | ICD-10-CM | POA: Diagnosis not present

## 2016-10-17 DIAGNOSIS — M7500 Adhesive capsulitis of unspecified shoulder: Secondary | ICD-10-CM

## 2016-10-17 DIAGNOSIS — E1159 Type 2 diabetes mellitus with other circulatory complications: Secondary | ICD-10-CM | POA: Diagnosis not present

## 2016-10-17 DIAGNOSIS — E11618 Type 2 diabetes mellitus with other diabetic arthropathy: Secondary | ICD-10-CM

## 2016-10-17 DIAGNOSIS — Z09 Encounter for follow-up examination after completed treatment for conditions other than malignant neoplasm: Secondary | ICD-10-CM | POA: Diagnosis not present

## 2016-10-17 MED ORDER — PIOGLITAZONE HCL 30 MG PO TABS: 30.0000 mg | ORAL_TABLET | Freq: Every day | ORAL | 2 refills | Status: DC

## 2016-10-17 MED ORDER — GLUCOSE BLOOD VI STRP: ORAL_STRIP | 12 refills | Status: DC

## 2016-10-17 MED ORDER — ACCU-CHEK FASTCLIX LANCETS MISC: 12 refills | Status: DC

## 2016-10-17 MED ORDER — INSULIN PEN NEEDLE 31G X 5 MM MISC: 2 refills | Status: DC

## 2016-10-17 MED ORDER — ACCU-CHEK GUIDE W/DEVICE KIT: 1.0000 | PACK | 0 refills | Status: DC

## 2016-10-17 MED ORDER — INSULIN ASPART PROT & ASPART (70-30 MIX) 100 UNIT/ML PEN: PEN_INJECTOR | SUBCUTANEOUS | 11 refills | Status: DC

## 2016-10-17 NOTE — Progress Notes (Signed)
Patient ID: Stephanie Lloyd, female    DOB: 1970/03/24  Age: 46 y.o. MRN: [id number]    Subjective:  Subjective  HPI Stephanie Lloyd presents for f/u dm. Pt ran out of meds   Needs her dm meds refilled and she has not been checking her blood sugars.    Review of Systems  Constitutional: Negative for appetite change, diaphoresis, fatigue and unexpected weight change.  Eyes: Negative for pain, redness and visual disturbance.  Respiratory: Negative for cough, chest tightness, shortness of breath and wheezing.   Cardiovascular: Negative for chest pain, palpitations and leg swelling.  Endocrine: Negative for cold intolerance, heat intolerance, polydipsia, polyphagia and polyuria.  Genitourinary: Negative for difficulty urinating, dysuria and frequency.  Neurological: Negative for dizziness, light-headedness, numbness and headaches.    History Past Medical History:  Diagnosis Date  . Diabetes ([place])   . Diabetes mellitus without complication ([place])   . Diastolic dysfunction   . Hypertension   . Immune deficiency disorder ([place])   . Menometrorrhagia 04/01/2016  . Stroke [facility])     She has a past surgical history that includes No past surgeries.   Her family history includes Diabetes in her father and paternal uncle; Hypertension in her father; Stroke in her brother, father, and paternal uncle.She reports that she has never smoked. She has never used smokeless tobacco. She reports that she does not drink alcohol or use drugs.  Current Outpatient Prescriptions on File Prior to Visit  Medication Sig Dispense Refill  . amLODipine (NORVASC) 10 MG tablet Take 1 tablet (10 mg total) by mouth daily. 90 tablet 3  . aspirin EC 325 MG EC tablet Take 1 tablet (325 mg total) by mouth daily. 30 tablet 11  . Cyanocobalamin (VITAMIN B-12 PO) Take 1 tablet by mouth daily with breakfast.    . docusate sodium (COLACE) 100 MG capsule Take 1 capsule (100 mg total) by mouth 2 (two) times daily. 30 capsule 1  .  fluticasone (FLONASE) 50 MCG/ACT nasal spray Place 2 sprays into both nostrils daily. 16 g 1  . imiquimod (ALDARA) 5 % cream Apply topically 3 (three) times a week. Apply until total clearance or maximum of 16 weeks 24 each 5  . lisinopril (PRINIVIL,ZESTRIL) 40 MG tablet Take 1 tablet (40 mg total) by mouth daily. 90 tablet 3  . polyethylene glycol (MIRALAX / GLYCOLAX) packet Take 17 g by mouth daily. 14 each 0  . prednisoLONE acetate (PRED FORTE) 1 % ophthalmic suspension Place 1 drop into both eyes 2 (two) times daily.     . Pyridoxine HCl (VITAMIN B-6 PO) Take 1 tablet by mouth daily with breakfast.    . Vitamin D, Ergocalciferol, (DRISDOL) 50000 units CAPS capsule Take 1 capsule (50,000 Units total) by mouth every 7 (seven) days. 12 capsule 0   No current facility-administered medications on file prior to visit.      Objective:  Objective  Physical Exam  Constitutional: She is oriented to person, place, and time. She appears well-developed and well-nourished.  HENT:  Head: Normocephalic and atraumatic.  Eyes: Conjunctivae and EOM are normal.  Neck: Normal range of motion. Neck supple. No JVD present. Carotid bruit is not present. No thyromegaly present.  Cardiovascular: Normal rate, regular rhythm and normal heart sounds.   No murmur heard. Pulmonary/Chest: Effort normal and breath sounds normal. No respiratory distress. She has no wheezes. She has no rales. She exhibits no tenderness.  Musculoskeletal: She exhibits no edema.  Neurological: She is alert and  oriented to person, place, and time.  Psychiatric: She has a normal mood and affect.  Nursing note and vitals reviewed.  BP 122/78 (BP Location: Right Arm, Patient Position: Sitting, Cuff Size: Normal)   Pulse 62   Temp 97.7 F (36.5 C) (Oral)   Ht '5\' 10"'  (1.778 m)   Wt 189 lb 6.4 oz (85.9 kg)   SpO2 99%   BMI 27.18 kg/m  Wt Readings from Last 3 Encounters:  10/17/16 189 lb 6.4 oz (85.9 kg)  09/16/16 187 lb (84.8 kg)    09/06/16 187 lb 3.2 oz (84.9 kg)     Lab Results  Component Value Date   WBC 7.0 09/06/2016   HGB 12.2 09/06/2016   HCT 37.4 09/06/2016   PLT 220.0 09/06/2016   GLUCOSE 156 (H) 09/06/2016   CHOL 199 07/08/2016   TRIG 86.0 07/08/2016   HDL 58.30 07/08/2016   LDLCALC 123 (H) 07/08/2016   ALT 20 08/30/2016   AST 19 08/30/2016   NA 138 09/06/2016   K 3.9 09/06/2016   CL 102 09/06/2016   CREATININE 1.21 (H) 09/06/2016   BUN 20 09/06/2016   CO2 30 09/06/2016   TSH 0.969 08/30/2016   INR 1.26 10/28/2015   HGBA1C 7.2 (H) 08/30/2016    Ct Abdomen Pelvis W Contrast  Result Date: 08/29/2016 CLINICAL DATA:  Abdominal distention for several days. EXAM: CT ABDOMEN AND PELVIS WITH CONTRAST TECHNIQUE: Multidetector CT imaging of the abdomen and pelvis was performed using the standard protocol following bolus administration of intravenous contrast. CONTRAST:  185m ISOVUE-300 IOPAMIDOL (ISOVUE-300) INJECTION 61% COMPARISON:  None. FINDINGS: Lower chest: Small bilateral pleural effusions. Small pericardial effusion measuring 12 mm in maximum thickness. Mild patchy opacity at the right lung base and minimal patchy opacity at the left lung base. Hepatobiliary: No focal liver abnormality is seen. No gallstones, gallbladder wall thickening, or biliary dilatation. Pancreas: Unremarkable. No pancreatic ductal dilatation or surrounding inflammatory changes. Spleen: Normal in size without focal abnormality. Adrenals/Urinary Tract: Adrenal glands are unremarkable. Kidneys are normal, without renal calculi, focal lesion, or hydronephrosis. Bladder is unremarkable. Stomach/Bowel: Prominent stool in the colon with mild perirectal edema. Under distended sigmoid colon with mild diffuse wall thickening and mildly ill-defined margins. No gastric or small bowel abnormalities. No evidence of appendicitis. Vascular/Lymphatic: Mild atheromatous arterial calcifications without aneurysm. No enlarged lymph nodes.  Reproductive: Intrauterine device in the central uterus. Left anterior exophytic uterine mass measuring 3.2 x 1.8 cm on image number 59 of series 3. This is also seen on coronal image number 49 and sagittal image number 104. No adnexal masses. Other: Minimal free peritoneal fluid. Musculoskeletal: Minimal lumbar spine degenerative changes. IMPRESSION: 1. Findings suggesting mild sigmoid colitis or diverticulitis. 2. Small amount of free peritoneal fluid. 3. Small bilateral pleural effusions. 4. Small pericardial effusion. 5. 3.2 cm exophytic uterine fibroid. 6. Mild right basilar and minimal left basilar patchy atelectasis or pneumonia. Electronically Signed   By: SClaudie ReveringM.D.   On: 08/29/2016 20:54     Assessment & Plan:  Plan  I am having Stephanie Lloyd maintain her aspirin, prednisoLONE acetate, amLODipine, lisinopril, Vitamin D (Ergocalciferol), imiquimod, Pyridoxine HCl (VITAMIN B-6 PO), Cyanocobalamin (VITAMIN B-12 PO), polyethylene glycol, docusate sodium, fluticasone, insulin aspart protamine - aspart, ACCU-CHEK FASTCLIX LANCETS, ACCU-CHEK GUIDE, glucose blood, Insulin Pen Needle, and pioglitazone.  Meds ordered this encounter  Medications  . insulin aspart protamine - aspart (NOVOLOG 70/30 MIX) (70-30) 100 UNIT/ML FlexPen    Sig: 10 u sq q am  and 5u sq qpm    Dispense:  15 mL    Refill:  11  . ACCU-CHEK FASTCLIX LANCETS MISC    Sig: Check blood sugar twice daily. Dx E:11.9    Dispense:  102 each    Refill:  12  . Blood Glucose Monitoring Suppl (ACCU-CHEK GUIDE) w/Device KIT    Sig: 1 Device by Does not apply route as directed. Check blood sugar daily    Dispense:  1 kit    Refill:  0  . glucose blood (ACCU-CHEK GUIDE) test strip    Sig: Check blood sugar twice daily. Dx E:11.9    Dispense:  100 each    Refill:  12    Accu-chek guide  . Insulin Pen Needle (B-D UF III MINI PEN NEEDLES) 31G X 5 MM MISC    Sig: To use with insulin    Dispense:  100 each    Refill:  2  .  pioglitazone (ACTOS) 30 MG tablet    Sig: Take 1 tablet (30 mg total) by mouth daily.    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 Encouraged heart healthy diet such as the DASH diet and exercise as tolerated.       Hyperlipidemia LDL goal <70    Tolerating statin, encouraged heart healthy diet, avoid trans fats, minimize simple carbs and saturated fats. Increase exercise as tolerated       Other Visit Diagnoses    Hospital discharge follow-up       Relevant Medications   pioglitazone (ACTOS) 30 MG tablet      Follow-up: Return in about 3 months (around 01/16/2017).  Ann Held, DO

## 2016-10-17 NOTE — Patient Instructions (Signed)

## 2016-10-18 ENCOUNTER — Telehealth: Payer: Self-pay | Admitting: Family Medicine

## 2016-10-18 NOTE — Telephone Encounter (Signed)
Pt was checked in at the 2:15 slot since she had an appt in the morning at 10:00 am but came in late at 11:15 and was reschedule for sameday at 2:15.

## 2016-10-18 NOTE — Telephone Encounter (Signed)
-----   Message from Stephanie Schultz, DO sent at 10/17/2016  9:14 PM EDT ----- For some reason she is marked unarrived but she was here and I did doc on her chart she I don't know what happened

## 2016-10-19 NOTE — Assessment & Plan Note (Signed)
Glucose check and meds refilled Discussed with pt imp of not running out of med rto 3 months

## 2016-10-19 NOTE — Assessment & Plan Note (Signed)
Tolerating statin, encouraged heart healthy diet, avoid trans fats, minimize simple carbs and saturated fats. Increase exercise as tolerated 

## 2016-10-19 NOTE — Assessment & Plan Note (Signed)
Well controlled, no changes to meds. Encouraged heart healthy diet such as the DASH diet and exercise as tolerated.  °

## 2016-10-31 ENCOUNTER — Ambulatory Visit: Payer: Self-pay

## 2016-10-31 ENCOUNTER — Encounter: Payer: Self-pay | Admitting: Family Medicine

## 2016-10-31 ENCOUNTER — Ambulatory Visit (INDEPENDENT_AMBULATORY_CARE_PROVIDER_SITE_OTHER): Payer: Medicaid Other | Admitting: Family Medicine

## 2016-10-31 DIAGNOSIS — M7500 Adhesive capsulitis of unspecified shoulder: Secondary | ICD-10-CM

## 2016-10-31 DIAGNOSIS — E11618 Type 2 diabetes mellitus with other diabetic arthropathy: Secondary | ICD-10-CM

## 2016-10-31 DIAGNOSIS — M25511 Pain in right shoulder: Principal | ICD-10-CM

## 2016-10-31 DIAGNOSIS — G8929 Other chronic pain: Secondary | ICD-10-CM | POA: Diagnosis not present

## 2016-10-31 NOTE — Patient Instructions (Signed)
Good to see you  Stephanie Lloyd is your friend.  We will get yo in with physical therapy  I would like to avoid another injection today but will consider it at follow up Keep up everything else.  See em again in 4-6 weeks!

## 2016-10-31 NOTE — Assessment & Plan Note (Signed)
Minimal improvement with the injection. Sent to formal physical therapy. Continue to do the home exercises and regimen. Continue the vitamin D. Patient will start to increase activity slowly over the course of next several weeks. Follow-up again with me in 4-6 weeks

## 2016-10-31 NOTE — Progress Notes (Signed)
Stephanie Lloyd [address] [place], [place] [address] Phone: [phone] Subjective:     CC: Right shoulder pain  [id number]  Stephanie Lloyd is a 46 y.o. female coming in for follow up for right shoulder pain. She has not had any improvement. Her pain is intermittent depending on how she moves her arm. She continues to have pain with overhead motions. Found to have more of a shoulder bursitis as well as a frozen shoulder. Patient states that the injection we made some minimal improvement. Continues to have limited range of motion in continuing to have pain.      Past Medical History:  Diagnosis Date  . Diabetes (HCC)   . Diabetes mellitus without complication (HCC)   . Diastolic dysfunction   . Hypertension   . Immune deficiency disorder (HCC)   . Menometrorrhagia 04/01/2016  . Stroke [facility])    Past Surgical History:  Procedure Laterality Date  . NO PAST SURGERIES     Social History   Social History  . Marital status: Married    Spouse name: N/A  . Number of children: N/A  . Years of education: N/A   Social History Main Topics  . Smoking status: Never Smoker  . Smokeless tobacco: Never Used  . Alcohol use No     Comment: occ  . Drug use: No  . Sexual activity: Yes    Birth control/ protection: None   Other Topics Concern  . Not on file   Social History Narrative   ** Merged History Encounter **       Allergies  Allergen Reactions  . Metformin And Related Diarrhea   Family History  Problem Relation Age of Onset  . Diabetes Father   . Hypertension Father   . Stroke Father   . Diabetes Paternal Uncle   . Stroke Paternal Uncle   . Stroke Brother   . Cancer Neg Hx   . Breast cancer Neg Hx   . Ovarian cancer Neg Hx      Past medical history, social, surgical and family history all reviewed in electronic medical record.  No pertanent information unless stated regarding to the chief complaint.   Review of Systems:Review  of systems updated and as accurate as of 10/31/16  No headache, visual changes, nausea, vomiting, diarrhea, constipation, dizziness, abdominal pain, skin rash, fevers, chills, night sweats, weight loss, swollen lymph nodes, body aches, joint swelling, muscle aches, chest pain, shortness of breath, mood changes.   Objective  There were no vitals taken for this visit. Systems examined below as of 10/31/16   General: No apparent distress alert and oriented x3 mood and affect normal, dressed appropriately.  HEENT: Pupils equal, extraocular movements intact  Respiratory: Patient's speak in full sentences and does not appear short of breath  Cardiovascular: No lower extremity edema, non tender, no erythema  Skin: Warm dry intact with no signs of infection or rash on extremities or on axial skeleton.  Abdomen: Soft nontender  Neuro: Cranial nerves II through XII are intact, neurovascularly intact in all extremities with 2+ DTRs and 2+ pulses.  Lymph: No lymphadenopathy of posterior or anterior cervical chain or axillae bilaterally.  Gait antalgic gait still noted secondary to hemiparesis MSK:  Non tender with full range of motion and good stability and strength and tone of  elbows, wrist, hip, knee and ankles bilaterally. Patient does have some weakness of the right upper extremity stable from previous exam  Shoulder: Right Inspection  reveals mild atrophy Palpation continued diffuse tenderness ROM decreased internal most all planes. Patient has forward flexion of 95, minimal external rotation, internal rotation to sacrum Rotator cuff strength 4 out of 5 which seems to be similar to previous exam No signs of impingement with negative Neer and Hawkin's tests, empty can sign. Speeds and Yergason's tests normal. No labral pathology noted with negative Obrien's, negative clunk and good stability. Normal scapular function observed. No painful arc and no drop arm sign. No apprehension  sign Contralateral shoulder has significant weakness       Impression and Recommendations:     This case required medical decision making of moderate complexity.      Note: This dictation was prepared with Dragon dictation along with smaller phrase technology. Any transcriptional errors that result from this process are unintentional.

## 2016-11-04 ENCOUNTER — Telehealth: Payer: Self-pay | Admitting: Family Medicine

## 2016-11-04 NOTE — Telephone Encounter (Signed)
Pt wants script for stool softner that is covered by medicare. Pt uses [facility] [address] [place].

## 2016-11-04 NOTE — Telephone Encounter (Signed)
Patient informed of PCP instructions. 

## 2016-11-04 NOTE — Telephone Encounter (Signed)
I don't know that there is one covered by medicare--- they are otc Colace 1 a day to start #30  2 refills

## 2016-11-06 ENCOUNTER — Ambulatory Visit: Payer: Medicaid Other | Attending: Family Medicine | Admitting: Speech Pathology

## 2016-11-06 DIAGNOSIS — R471 Dysarthria and anarthria: Secondary | ICD-10-CM | POA: Diagnosis present

## 2016-11-06 NOTE — Therapy (Signed)
[facility] [address] [place], [place], [address] Phone: [phone]   Fax:  [phone]  Speech Language Pathology Evaluation  Patient Details  Name: Stephanie Lloyd MRN: [id number] Date of Birth: Nov 04, 1970 Referring Provider: Dr. Arva Chafe  Encounter Date: 11/06/2016      End of Session - 11/06/16 1221    Visit Number 1   Number of Visits 9   Date for SLP Re-Evaluation 12/20/16   Authorization Type pending medicaid auth   SLP Start Time [phone]   SLP Stop Time  1015   SLP Time Calculation (min) 29 min   Activity Tolerance Patient tolerated treatment well      Past Medical History:  Diagnosis Date  . Diabetes (HCC)   . Diabetes mellitus without complication (HCC)   . Diastolic dysfunction   . Hypertension   . Immune deficiency disorder (HCC)   . Menometrorrhagia 04/01/2016  . Stroke [facility])     Past Surgical History:  Procedure Laterality Date  . NO PAST SURGERIES      There were no vitals filed for this visit.      Subjective Assessment - 11/06/16 1017    Subjective "I'm still having trouble talking" - Pt arrived 16 minutes late for evaluation            SLP Evaluation [facility] - 11/06/16 0955      SLP Visit Information   SLP Received On 11/06/16   Referring Provider Dr. Arva Chafe   Onset Date January 2018   Medical Diagnosis CVA     Subjective   Subjective Pt arrived 16 minutes late to eval   Patient/Family Stated Goal "To learn some different techniques to make my speech better"     General Information   HPI 46 y.o. female know to [place] from prior course of ST 03/14/16 to 05/30/16 for dysarthria and dysphagia. 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 46 year old female with history of type 2 diabetes, hyperlipidemia, hypertension, prior history of a stroke, chronic diastolic congestive heart failure.   Mobility Status reduced  balance, denies falls, stumbles a lot; had PT prior     Prior Functional Status   Cognitive/Linguistic Baseline Baseline deficits   Type of Home House    Lives With Spouse;Family   Available Support Family   Vocation On disability  disablility pending     Cognition   Overall Cognitive Status History of cognitive impairments - at baseline     Oral Motor/Sensory Function   Overall Oral Motor/Sensory Function Impaired at baseline   Labial ROM Reduced left   Labial Symmetry Abnormal symmetry left   Labial Strength Reduced Left   Labial Sensation Within Functional Limits   Labial Coordination Reduced   Lingual ROM Reduced left   Lingual Symmetry Within Functional Limits   Lingual Strength Reduced Left   Lingual Sensation Within Functional Limits   Lingual Coordination Reduced   Facial ROM Within Functional Limits   Velum Within Functional Limits     Motor Speech   Overall Motor Speech Impaired   Respiration Impaired   Level of Impairment Conversation   Phonation Low vocal intensity   Resonance Within functional limits   Articulation Impaired   Level of Impairment Sentence   Intelligibility Intelligibility reduced   Word 75-100% accurate   Phrase 75-100% accurate   Sentence 75-100% accurate   Conversation 75-100% accurate   Motor Planning Witnin functional limits   Motor Speech Errors Aware  Effective Techniques Slow rate;Increased vocal intensity;Over-articulate;Pause                         SLP Education - 11/06/16 1220    Education provided Yes   Education Details compensations for dysarthria, HEP for dysarthria, ST pending medicaid approval   Person(s) Educated Patient   Methods Explanation;Demonstration;Verbal cues;Handout   Comprehension Verbalized understanding;Returned demonstration;Verbal cues required;Need further instruction            SLP Long Term Goals - 11/06/16 1230      SLP LONG TERM GOAL #1   Title pt will complete HEP for  dysarthria with rare min A over two sessions   Baseline she is not completing HEP for dysarthria   Time 6   Period Weeks   Status New     SLP LONG TERM GOAL #2   Title Pt will utilize compensations for dysarthria in simple conversation over 5 minutes with rare min A   Baseline pt not utilizing compensations for dysarthria   Time 6   Period Weeks   Status New     SLP LONG TERM GOAL #3   Title Pt will be 100% intellgilble in noisy environment and while walking over 12 minute conversation with rare min A   Baseline Pt 90% intellgible in noisy environment   Time 6   Period Weeks   Status New          Plan - 11/06/16 1222    Clinical Impression Statement Mrs. Hallstrom, a 46 y.o. female with recent hospitalization 08/29/16 to 08/30/16 for colitis, is referred by physician due to dysarthria and mouth weakness from prior CVA. Mrs. Benningfield reports her speech has declined since prior CVA. According to Mrs. Melina Fiddler, her slurred speech affects her ability to communicate in the community, especially at church and stores and more noisy environments. She reports she is frequently asked to repeat herself outside of the home. Today she presents with mild dysarthria. She received outpt ST 03/14/16 to 05/30/16, however does not recall her HEP for dysarthria, and recalled only 1 compensation for dysarthria. Rapid alternating speech tasks reveal reduced coordination of speech musculature, with slur and  inconsistent rate. Her conversation in this quiet treatment room is 100%, however she reports difficulty in noisy situations. Mildly reduced breath support for speech with low volume also affecting pt's intellgibility I recommend short course of skilled ST to maximize her intellgilbity for improved independence in community activities. Today, she was trained in HEP for dysarthria and intiated training in compensations for dysarthria.    Speech Therapy Frequency --  6 visits over 8 weeks   Treatment/Interventions  SLP instruction and feedback;Compensatory strategies;Internal/external aids;Environmental controls;Patient/family education;Functional tasks;Cueing hierarchy   Potential to Achieve Goals Fair   Potential Considerations Ability to learn/carryover information;Previous level of function      Patient will benefit from skilled therapeutic intervention in order to improve the following deficits and impairments:   Dysarthria and anarthria - Plan: SLP plan of care cert/re-cert    Problem List Patient Active Problem List   Diagnosis Date Noted  . Hypoglycemia due to insulin 08/29/2016  . Diverticulosis 08/29/2016  . 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 Diverticulitis 08/29/2016  . 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 OSA (obstructive sleep apnea) 06/10/2016  . Iron deficiency anemia due to chronic blood loss 04/01/2016  . Menometrorrhagia 04/01/2016  . Upper respiratory infection 03/25/2016  .  History of stroke   . History of intracerebral hemorrhage without residual deficit   . Cerebrovascular accident (CVA) due to thrombosis of basilar artery (HCC) 02/28/2016  . Ischemic stroke (HCC) 02/28/2016  . Acute kidney injury (HCC) 10/28/2015  . Acute encephalopathy 10/28/2015  . Diastolic dysfunction   . Stroke (cerebrum) (HCC)   . Focal infarction of brain (HCC) 10/08/2015  . Syncope 08/19/2015  . Hypertensive emergency 07/29/2015  . Hypertensive urgency 07/28/2015  . Hypokalemia 07/28/2015  . Accelerated hypertension 09/21/2014  . Type 2 diabetes mellitus with other circulatory complications (HCC) 09/21/2014  . Hyperlipidemia 09/21/2014  . 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 Hyperlipidemia LDL goal <70 07/29/2014  . Hemorrhagic stroke (HCC) 06/22/2014  . ICH (intracerebral hemorrhage) (HCC) 06/22/2014  . Essential hypertension 05/13/2012    Lemma Tetro, Radene Journey MS, CCC-SLP 11/06/2016, 3:16 PM  [facility] [address] [place], [place], [address] Phone: [phone]   Fax:  [phone]  Name: KINZE LABO MRN: [id number] Date of Birth: 08/09/1970

## 2016-11-06 NOTE — Patient Instructions (Signed)
    SLOW LOUD OVER-ENNUNCIATE PAUSE  Say the following 5x each slow, big - make each sound distinct  PA TA KA  PATA TAKA KAPA PATAKA  BUTTERCUP  CATERPILLAR  BASEBALLL PLAYER  [place]  [place] BUCCANEERS  SLOW AND BIG - EXAGGERATE YOUR MOUTH, MAKE EACH CONSONANT  Speech exercises - do 5x each, x2-3/day SLOW BIG  SAY THE FOLLOWING- make every sound! Red leather, yellow leather       [place] [employer]aneers Proper copper coffee pot Three free throws [place] [employer] Container, Blue Bulb Flash Message Six Thick Thistles Stick Double Bubble Gum Freshly Fried Fat Fish Cinnamon aluminum linoleum Black bugs blood Lovely lemon linament  Tying Tape Takes Time A Shifty Salt Shaker   Shirts shrink, shells shouldn't [address] Call the cat "Buttercup" A calendar of [place] Unique [place] A Three Toed Tree Toad Knapsack Strap Snap Rubber [address] Bumpers

## 2016-11-07 ENCOUNTER — Ambulatory Visit: Payer: Self-pay | Admitting: Family Medicine

## 2016-11-14 ENCOUNTER — Ambulatory Visit: Payer: Medicaid Other | Attending: Family Medicine | Admitting: Physical Therapy

## 2016-11-14 DIAGNOSIS — R29898 Other symptoms and signs involving the musculoskeletal system: Secondary | ICD-10-CM | POA: Diagnosis present

## 2016-11-14 DIAGNOSIS — M25511 Pain in right shoulder: Secondary | ICD-10-CM | POA: Diagnosis present

## 2016-11-14 DIAGNOSIS — M25611 Stiffness of right shoulder, not elsewhere classified: Secondary | ICD-10-CM | POA: Diagnosis not present

## 2016-11-14 DIAGNOSIS — R293 Abnormal posture: Secondary | ICD-10-CM | POA: Insufficient documentation

## 2016-11-14 NOTE — Patient Instructions (Signed)
Cane Exercise: Flexion   Lie on back, holding cane above chest. Keeping arms as straight as possible, lower cane toward floor beyond head. Hold __5-10__ seconds. Repeat __15__ times. Do __2-3__ sessions per day.  Cane Exercise: Abduction   Hold cane with right hand over end, palm-up, with other hand palm-down. Move arm out from side and up by pushing with other arm. Hold __5-10__ seconds. Repeat _15___ times. Do __2-3__ sessions per day.  SHOULDER: External Rotation - Supine (Cane)   Hold cane with both hands. Rotate arm away from body. Keep elbow on floor and next to body. _15__ reps per set, _2-3__ sets per day.  Scapular Retraction (Standing)   With arms at sides, pinch shoulder blades together. Repeat __15__ times per set.  Do __2-3__ sessions per day.  Closed Chain: Shoulder Flexion / Extension - on Wall   Hands on wall, step backward. Return. Stepping causes shoulder flexion and extension Do __15_ times **or hands on countertop**  Shoulder Internal Rotation   Standing, feet shoulder width apart, grasp club with one hand palm forward, arm extended above head, and other hand palm back behind back, arm bent elbow down. Pull gently upward. Hold __30__ seconds.  Repeat _3-5___ times. Do __2-3__ sessions per day.

## 2016-11-14 NOTE — Therapy (Signed)
[facility] [place] [address]  [address] [place], [place], [address] Phone: [phone]   Fax:  [phone]  Physical Therapy Treatment  Patient Details  Name: Manfred ShirtsMarva N Henneman MRN: [id number] Date of Birth: 10/17/1970 Referring Provider: Dr. Adria DillZachery Smith  Encounter Date: 11/14/2016      PT End of Session - 11/14/16 1407    Visit Number 1   Authorization Type Medicaid (submitted for approval)   PT Start Time 0917   PT Stop Time 1015   PT Time Calculation (min) 58 min   Activity Tolerance Patient tolerated treatment well   Behavior During Therapy [facility] for tasks assessed/performed      Past Medical History:  Diagnosis Date  . Diabetes (HCC)   . Diabetes mellitus without complication (HCC)   . Diastolic dysfunction   . Hypertension   . Immune deficiency disorder (HCC)   . Menometrorrhagia 04/01/2016  . Stroke [facility](HCC)     Past Surgical History:  Procedure Laterality Date  . NO PAST SURGERIES      There were no vitals filed for this visit.      Subjective Assessment - 11/14/16 0918    Subjective Patient reporting frozen shoulder for a couple months. Has most trouble with lifting arm, is able to sleep on shoulder. Hx of 4 CVA (May 2016, Jan 2017, Aug 2017, Sept 2017). Did try an injection - little relief.    Pertinent History DM 2, HTN (controlled), Hx 4 CVA   Patient Stated Goals improve motion and use of arm   Currently in Pain? No/denies   Pain Score 0-No pain  pain "if I hit my arm on something"            [facility] - 11/14/16 0921      Assessment   Medical Diagnosis Diabetic frozen shoulder associated with DM2   Referring Provider Dr. Adria DillZachery Smith   Next MD Visit --  "next month"   Prior Therapy yes - unsure of timeline     Precautions   Precautions None     Restrictions   Weight Bearing Restrictions No     Balance Screen   Has the patient fallen in the past 6 months No   Has the patient  had a decrease in activity level because of a fear of falling?  No   Is the patient reluctant to leave their home because of a fear of falling?  No     Home [employer] information centre managernvironment   Living Environment Private residence   Living Arrangements Spouse/significant other;Other relatives   Available Help at Discharge Family   Type of Home House   Home Access Level entry   Home Layout One level     Prior Function   Level of Independence Independent   Vocation Unemployed     Cognition   Overall Cognitive Status History of cognitive impairments - at baseline     Observation/Other Assessments   Focus on Therapeutic Outcomes (FOTO)  Shoulder: 51 (49% limited, predicted 37% limited)     Sensation   Light Touch Appears Intact     Coordination   Gross Motor Movements are Fluid and Coordinated No  at L shoulder due to dx     Posture/Postural Control   Posture/Postural Control Postural limitations   Postural Limitations Rounded Shoulders;Forward head     ROM / Strength   AROM / PROM / Strength AROM;PROM;Strength     AROM   AROM Assessment Site Shoulder  Right/Left Shoulder Right   Right Shoulder Flexion 93 Degrees   Right Shoulder ABduction 81 Degrees   Right Shoulder Internal Rotation --  FIR to iliac crest   Right Shoulder External Rotation --  FER to occiput   Left Shoulder Flexion --   Left Shoulder ABduction --   Left Shoulder Internal Rotation --   Left Shoulder External Rotation --     PROM   PROM Assessment Site Shoulder   Right/Left Shoulder Right   Right Shoulder Flexion 99 Degrees   Right Shoulder ABduction 83 Degrees   Right Shoulder Internal Rotation 32 Degrees   Right Shoulder External Rotation 27 Degrees     Strength   Overall Strength Comments deferred due to inability to lift UE against gravity     Palpation   Palpation comment diffusely non-tender                     [facility] Adult PT Treatment/Exercise - 11/14/16 0921      Exercises   Exercises  Shoulder     Shoulder Exercises: Supine   External Rotation AAROM;Right;10 reps   Flexion AAROM;Right;10 reps   ABduction AAROM;Right;10 reps     Shoulder Exercises: Seated   Retraction 10 reps;Both     Shoulder Exercises: Standing   Other Standing Exercises counter walk outs for flexion ROM x 5 reps                PT Education - 11/14/16 1406    Education provided Yes   Education Details exam findings, POC, HEP - medicaid processes   Person(s) Educated Patient;Child(ren)   Methods Explanation;Demonstration;Handout   Comprehension Verbalized understanding;Returned demonstration;Tactile cues required;Verbal cues required;Need further instruction          PT Short Term Goals - 11/14/16 1433      PT SHORT TERM GOAL #1   Title patient to be independent with initial HEP for improved functional mobility of R UE.    Status New   Target Date 12/12/16           PT Long Term Goals - 11/14/16 1434      PT LONG TERM GOAL #1   Title patient to be independent with advanced HEP for improved functional mobility of R UE   Status New   Target Date 12/26/16     PT LONG TERM GOAL #2   Title patient to improve R UE AROM to >/= 150 degrees flexion and abduction and >/= 60 degrees of IR and ER for improved functional use of R UE   Status New   Target Date 12/26/16     PT LONG TERM GOAL #3   Title patient to improve strength to >/= 4+/5 at R UE   Status New   Target Date 12/26/16     PT LONG TERM GOAL #4   Title Patient to report ability to perform ADLs and light household tasks without pain or ROM limiting R UE   Status New   Target Date 12/26/16               Plan - 11/14/16 1411    Clinical Impression Statement Ms. Putman is a 46 y/o female presenting to OPPT today regarding frozen shoulder of R UE, that has been present for some time. Patient presents with her son. Patient with a complicated medical history significant for CVA x 4, DM 2, and HTN. Patient  today with restricted PROM and AROM of R shoulder with pain  at end ranges of all motion. Strength testing deferred today due to inability to raise UE against gravity. Patient given initial HEP today for getnle stretching along with periscapular strengthening with good carryover. Patient to benefit from PT to address the above listed deficits to allow for improved functional use of R UE.    Clinical Presentation Evolving   Clinical Presentation due to: CVA x 4, DM2, HTN   Clinical Decision Making Moderate   Rehab Potential Good   PT Frequency 1x / week   PT Duration 3 weeks  per Medicaid protocol, will adjust accordingly   PT Treatment/Interventions ADLs/Self Care Home Management;Cryotherapy;Electrical Stimulation;Iontophoresis 4mg /ml Dexamethasone;Moist Heat;Ultrasound;Neuromuscular re-education;Therapeutic exercise;Therapeutic activities;Patient/family education;Manual techniques;Passive range of motion;Vasopneumatic Device;Taping;Dry needling   Consulted and Agree with Plan of Care Patient      Patient will benefit from skilled therapeutic intervention in order to improve the following deficits and impairments:  Pain, Impaired UE functional use, Decreased strength, Decreased range of motion, Postural dysfunction  Visit Diagnosis: Stiffness of right shoulder, not elsewhere classified - Plan: PT plan of care cert/re-cert  Acute pain of right shoulder - Plan: PT plan of care cert/re-cert  Abnormal posture - Plan: PT plan of care cert/re-cert  Other symptoms and signs involving the musculoskeletal system - Plan: PT plan of care cert/re-cert     Problem List Patient Active Problem List   Diagnosis Date Noted  . Hypoglycemia due to insulin 08/29/2016  . Diverticulosis 08/29/2016  . 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 Diverticulitis 08/29/2016  . 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 OSA (obstructive sleep apnea)  06/10/2016  . Iron deficiency anemia due to chronic blood loss 04/01/2016  . Menometrorrhagia 04/01/2016  . Upper respiratory infection 03/25/2016  . History of stroke   . History of intracerebral hemorrhage without residual deficit   . Cerebrovascular accident (CVA) due to thrombosis of basilar artery (HCC) 02/28/2016  . Ischemic stroke (HCC) 02/28/2016  . Acute kidney injury (HCC) 10/28/2015  . Acute encephalopathy 10/28/2015  . Diastolic dysfunction   . Stroke (cerebrum) (HCC)   . Focal infarction of brain (HCC) 10/08/2015  . Syncope 08/19/2015  . Hypertensive emergency 07/29/2015  . Hypertensive urgency 07/28/2015  . Hypokalemia 07/28/2015  . Accelerated hypertension 09/21/2014  . Type 2 diabetes mellitus with other circulatory complications (HCC) 09/21/2014  . Hyperlipidemia 09/21/2014  . 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 Hyperlipidemia LDL goal <70 07/29/2014  . Hemorrhagic stroke (HCC) 06/22/2014  . ICH (intracerebral hemorrhage) (HCC) 06/22/2014  . Essential hypertension 05/13/2012     Kipp Laurence, PT, DPT 11/14/16 2:47 PM   [facility] [address]  [address] [place], [place], [address] Phone: [phone]   Fax:  [phone]  Name: RENELL COAXUM MRN: [id number] Date of Birth: 1971-01-18

## 2016-11-18 ENCOUNTER — Telehealth: Payer: Self-pay | Admitting: *Deleted

## 2016-11-18 NOTE — Telephone Encounter (Signed)
Received [2] faxes from Retta [employer] regarding their representation of patient for her Social Security Disability case. They are requesting a letter from provider summerizing medical impairments and limitations, also; there is a Medical Assessment form [6 pages] they are requesting to be completed. Both letters are requesting Entire Medical Records to be released for patient [records, exam, results, reports pathology, consultation, imaging, Emergency Room, discharge, progress notes, films]. Will forward to [place] as to where this paperwork should be forwarded [all to medical records [?]/SLS 10/22

## 2016-11-22 ENCOUNTER — Ambulatory Visit: Payer: Medicaid Other | Admitting: Physical Therapy

## 2016-11-22 DIAGNOSIS — M25511 Pain in right shoulder: Secondary | ICD-10-CM

## 2016-11-22 DIAGNOSIS — M25611 Stiffness of right shoulder, not elsewhere classified: Secondary | ICD-10-CM

## 2016-11-22 DIAGNOSIS — R29898 Other symptoms and signs involving the musculoskeletal system: Secondary | ICD-10-CM

## 2016-11-22 DIAGNOSIS — R293 Abnormal posture: Secondary | ICD-10-CM

## 2016-11-22 NOTE — Therapy (Addendum)
[facility] [address]  [place], [place], [address] Phone: [phone]   Fax:  [phone]  Physical Therapy Treatment  Patient Details  Name: Stephanie Lloyd MRN: [id number] Date of Birth: [birth year] Referring Provider: Dr. Drusilla Kanner  Encounter Date: 11/22/2016      PT End of Session - 11/22/16 0859    Visit Number 2   Number of Visits --   Authorization Type --   Authorization - Visit Number --   Authorization - Number of Visits --   PT Start Time 0848   PT Stop Time 0931   PT Time Calculation (min) 43 min   Activity Tolerance Patient tolerated treatment well   Behavior During Therapy [facility] for tasks assessed/performed      Past Medical History:  Diagnosis Date  . Diabetes ([place])   . Diabetes mellitus without complication ([place])   . Diastolic dysfunction   . Hypertension   . Immune deficiency disorder ([place])   . Menometrorrhagia 04/01/2016  . Stroke [facility])     Past Surgical History:  Procedure Laterality Date  . NO PAST SURGERIES      There were no vitals filed for this visit.      Subjective Assessment - 11/22/16 0851    Subjective feels about the same; son went back to [facility]; has not done any of HEP/stretching due to husband being in the hospital   Pertinent History DM 2, HTN (controlled), Hx 4 CVA   Patient Stated Goals improve motion and use of arm   Currently in Pain? No/denies   Pain Score 0-No pain  6/10 with PROM                         [facility] Adult PT Treatment/Exercise - 11/22/16 0001      Shoulder Exercises: ROM/Strengthening   Wall Wash flexion x 10 reps; abduction x 10 reps - slight scaption with both motions   Other ROM/Strengthening Exercises elbow push-outs x 10 - heavy VC for upright posture     Shoulder Exercises: Stretch   Other Shoulder Stretches low doorway stretch - 3 x 30 seconds     Manual Therapy   Manual Therapy Soft tissue  mobilization;Joint mobilization;Passive ROM   Manual therapy comments patient supine with bolster   Joint Mobilization R GH joint - inferior and posterior - 10 x 10 sec each direction   Passive ROM PROM at R [facility] joint into all planes - pain at end ranges of all motion                  PT Short Term Goals - 11/22/16 0859      PT SHORT TERM GOAL #1   Title patient to be independent with initial HEP for improved functional mobility of R UE.    Status On-going  poor adherenace currently           PT Long Term Goals - 11/22/16 0900      PT LONG TERM GOAL #1   Title patient to be independent with advanced HEP for improved functional mobility of R UE   Status On-going     PT LONG TERM GOAL #2   Title patient to improve R UE AROM to >/= 150 degrees flexion and abduction and >/= 60 degrees of IR and ER for improved functional use of R UE   Status On-going     PT LONG TERM GOAL #3  Title patient to improve strength to >/= 4+/5 at R UE   Status On-going     PT LONG TERM GOAL #4   Title Patient to report ability to perform ADLs and light household tasks without pain or ROM limiting R UE   Status On-going               Plan - 11/22/16 0900    Clinical Impression Statement Jim reporting poor compliance with HEP thus far - reinforced education on need for good compliance to allow for improved ROM and funcitonal use of R UE. Patient tolerating all stretching at R [facility] joint, however, pain at all end ranges of available motion. Joint mobilizations also performed for gentle capsualr stretching. Will continue to progress as patient tolerates.    PT Treatment/Interventions ADLs/Self Care Home Management;Cryotherapy;Electrical Stimulation;Iontophoresis 75m/ml Dexamethasone;Moist Heat;Ultrasound;Neuromuscular re-education;Therapeutic exercise;Therapeutic activities;Patient/family education;Manual techniques;Passive range of motion;Vasopneumatic Device;Taping;Dry needling    Consulted and Agree with Plan of Care Patient      Patient will benefit from skilled therapeutic intervention in order to improve the following deficits and impairments:  Pain, Impaired UE functional use, Decreased strength, Decreased range of motion, Postural dysfunction  Visit Diagnosis: Stiffness of right shoulder, not elsewhere classified  Acute pain of right shoulder  Abnormal posture  Other symptoms and signs involving the musculoskeletal system     Problem List Patient Active Problem List   Diagnosis Date Noted  . Hypoglycemia due to insulin 08/29/2016  . Diverticulosis 08/29/2016  . 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 Diverticulitis 08/29/2016  . 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 OSA (obstructive sleep apnea) 06/10/2016  . Iron deficiency anemia due to chronic blood loss 04/01/2016  . Menometrorrhagia 04/01/2016  . Upper respiratory infection 03/25/2016  . History of stroke   . History of intracerebral hemorrhage without residual deficit   . Cerebrovascular accident (CVA) due to thrombosis of basilar artery ([place] 02/28/2016  . Ischemic stroke ([place] 02/28/2016  . Acute kidney injury ([place] 10/28/2015  . Acute encephalopathy 10/28/2015  . Diastolic dysfunction   . Stroke (cerebrum) ([place]   . Focal infarction of brain ([place] 10/08/2015  . Syncope 08/19/2015  . Hypertensive emergency 07/29/2015  . Hypertensive urgency 07/28/2015  . Hypokalemia 07/28/2015  . Accelerated hypertension 09/21/2014  . Type 2 diabetes mellitus with other circulatory complications ([place] 057/84/6962 . Hyperlipidemia 09/21/2014  . Thalamic hemorrhage with stroke ([place] 07/29/2014  . Hyperlipidemia LDL goal <70 07/29/2014  . Hemorrhagic stroke ([place] 06/22/2014  . ICH (intracerebral hemorrhage) ([place] 06/22/2014  . Essential hypertension 05/13/2012     SLanney Gins PT, DPT 11/22/16 10:09 AM   PT submitting for  Medicaid approval, after visit, PT seeing that patient was denied. Therefore, no charges made on this visit today.   PHYSICAL THERAPY DISCHARGE SUMMARY  Visits from Start of Care: 2  Current functional level related to goals / functional outcomes: See above, continued pain and limited ROM and strength   Remaining deficits: See above, ROM, strength, pain   Education / Equipment: HEP  Plan: Patient agrees to discharge.  Patient goals were not met. Patient is being discharged due to                                                     ?????  Patient medicaid benefits denied for PT at this time as patient has utilized all allotted visits for this calendar year. PT did make recommendations for continued PT at [facility] with good self and family understanding.  Lanney Gins, PT, DPT 12/17/16 2:33 PM   [facility] [address]  [place], [place], [address] Phone: [phone]   Fax:  [phone]  Name: Stephanie Lloyd MRN: [id number] Date of Birth: 22-Jan-1971

## 2016-11-22 NOTE — Patient Instructions (Signed)
Wall Wash   With Right arm on wall, slide up, hold for stretch, gently bring arm down 10-15 reps, 1-2 times a day     Hands behind head and head up tall - push elbows aprt for stretch, bring them back together, and repeat 10-15 times, 1-2 times a day    Hands down low at frame - step in and hold for 30 seconds, repeat 3 times

## 2016-11-26 ENCOUNTER — Ambulatory Visit: Payer: Medicaid Other | Admitting: Speech Pathology

## 2016-11-26 DIAGNOSIS — R471 Dysarthria and anarthria: Secondary | ICD-10-CM | POA: Diagnosis not present

## 2016-11-26 NOTE — Therapy (Signed)
[facility] [address] [place], [place], [address] Phone: [phone]   Fax:  [phone]  Speech Language Pathology Treatment  Patient Details  Name: Stephanie ShirtsMarva N Smaltz MRN: [id number] Date of Birth: 04/02/1970 Referring Provider: Dr. Arva ChafeNicholas Wendling  Encounter Date: 11/26/2016      End of Session - 11/26/16 1101    Visit Number 2   Number of Visits 9  medicaid auth 3 visits   Date for SLP Re-Evaluation 12/20/16   Authorization Type medicaid auth 3 visits 11/26/16 thru 12/26/16   Authorization - Visit Number 1   Authorization - Number of Visits 3   SLP Start Time 1017   SLP Stop Time  1056   SLP Time Calculation (min) 39 min      Past Medical History:  Diagnosis Date  . Diabetes ([facility])   . Diabetes mellitus without complication ([facility])   . Diastolic dysfunction   . Hypertension   . Immune deficiency disorder ([facility])   . Menometrorrhagia 04/01/2016  . Stroke [facility](HCC)     Past Surgical History:  Procedure Laterality Date  . NO PAST SURGERIES      There were no vitals filed for this visit.             ADULT SLP TREATMENT - 11/26/16 1025      General Information   Behavior/Cognition Alert;Cooperative;Pleasant mood     Treatment Provided   Treatment provided Cognitive-Linquistic     Pain Assessment   Pain Assessment No/denies pain     Cognitive-Linquistic Treatment   Treatment focused on Dysarthria   Skilled Treatment Pt performed HEP for dysarthria with occasional min modeling and instruction. Facilitated compensations for dysarthria in strcutured task, generating sentences with multiple meaning words and similarities and differences with occasional min to mod cues for slow rate and over articulation. Simple conversation re: holiday tradition with 100% intelligibility      Assessment / Recommendations / Plan   Plan Continue with current plan of care     Progression Toward Goals   Progression  toward goals Progressing toward goals          SLP Education - 11/26/16 1056    Education provided Yes   Education Details HEP for dysarthria, compensations for dysarthria, complete exercises twice a day; practice compensations in community   Person(s) Educated Patient   Methods Explanation;Demonstration;Verbal cues;Handout   Comprehension Verbalized understanding;Returned demonstration;Verbal cues required;Need further instruction            SLP Long Term Goals - 11/26/16 1101      SLP LONG TERM GOAL #1   Title pt will complete HEP for dysarthria with rare min A over two sessions   Baseline she is not completing HEP for dysarthria   Time 6   Period Weeks   Status On-going     SLP LONG TERM GOAL #2   Title Pt will utilize compensations for dysarthria in simple conversation over 5 minutes with rare min A   Baseline pt not utilizing compensations for dysarthria   Time 6   Period Weeks   Status On-going     SLP LONG TERM GOAL #3   Title Pt will be 100% intellgilble in noisy environment and while walking over 12 minute conversation with rare min A   Baseline Pt 90% intellgible in noisy environment   Time 6   Period Weeks   Status On-going          Plan - 11/26/16 1057  Clinical Impression Statement Pt performed HEP for dysarthria with occasional min A - I added to HEP with instructions to complete these twice a day. Ongoing instruction and training for compensations for dysarthria in structured tasks and simple converation with occasional min A.  Continue skilled ST to maximize intellgibilty for community interactions.   Speech Therapy Frequency --  medicaid approved 3 visits - 1x every other week   Duration 4 weeks   Treatment/Interventions SLP instruction and feedback;Compensatory strategies;Internal/external aids;Environmental controls;Patient/family education;Functional tasks;Cueing hierarchy   Potential to Achieve Goals Fair   Potential Considerations Ability  to learn/carryover information;Previous level of function   SLP Home Exercise Plan HEP for dysarthria- See pt instructions   Consulted and Agree with Plan of Care Patient      Patient will benefit from skilled therapeutic intervention in order to improve the following deficits and impairments:   Dysarthria and anarthria    Problem List Patient Active Problem List   Diagnosis Date Noted  . Hypoglycemia due to insulin 08/29/2016  . Diverticulosis 08/29/2016  . 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 Diverticulitis 08/29/2016  . 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 OSA (obstructive sleep apnea) 06/10/2016  . Iron deficiency anemia due to chronic blood loss 04/01/2016  . Menometrorrhagia 04/01/2016  . Upper respiratory infection 03/25/2016  . History of stroke   . History of intracerebral hemorrhage without residual deficit   . Cerebrovascular accident (CVA) due to thrombosis of basilar artery (HCC) 02/28/2016  . Ischemic stroke (HCC) 02/28/2016  . Acute kidney injury (HCC) 10/28/2015  . Acute encephalopathy 10/28/2015  . Diastolic dysfunction   . Stroke (cerebrum) (HCC)   . Focal infarction of brain (HCC) 10/08/2015  . Syncope 08/19/2015  . Hypertensive emergency 07/29/2015  . Hypertensive urgency 07/28/2015  . Hypokalemia 07/28/2015  . Accelerated hypertension 09/21/2014  . Type 2 diabetes mellitus with other circulatory complications (HCC) 09/21/2014  . Hyperlipidemia 09/21/2014  . 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 Hyperlipidemia LDL goal <70 07/29/2014  . Hemorrhagic stroke (HCC) 06/22/2014  . ICH (intracerebral hemorrhage) (HCC) 06/22/2014  . Essential hypertension 05/13/2012    Mittie Knittel, Radene Journey MS, CCC-SLP 11/26/2016, 11:03 AM  [facility] [address] [place], [place], [address] Phone: [phone]   Fax:   [phone]   Name: ATHELENE HURSEY MRN: [id number] Date of Birth: Feb 18, 1970

## 2016-11-26 NOTE — Patient Instructions (Addendum)
   Continue speech exercises twice a day  Say each tongue twisters 3x each   Practice talking Slow, Loud and Big in the community and over the phone - you will feel funny and un-natural - that's OK    Get the persons attention before you speak  Use eye contact and face the person you are speaking to  Be in close proximity to the person you are speaking to  Turn down any noise in the environment such as the TV, walk away from loud appliances, air conditioners, fans, dish washers, running faucets etc

## 2016-11-29 ENCOUNTER — Ambulatory Visit: Payer: Medicaid Other | Admitting: Physical Therapy

## 2016-12-03 ENCOUNTER — Encounter: Payer: Self-pay | Admitting: Speech Pathology

## 2016-12-03 ENCOUNTER — Other Ambulatory Visit: Payer: Self-pay | Admitting: Family Medicine

## 2016-12-03 NOTE — Telephone Encounter (Signed)
Refill denied. Pt has completed course of treatment.  

## 2016-12-04 NOTE — Progress Notes (Signed)
[facility] [address] [place], [place] [address] Phone: [phone] Subjective:     CC: right shoulder pain follow-up  [id number]  Stephanie Lloyd is a 46 y.o. female coming in with complaint of right shoulder pain.  Patient was found to have frozen shoulder and was not making improvement with home exercises.  Sent to formal physical therapy with patient has been working diligently.  Patient states having still pain.  Only states that she is 50% better.  States that the movement is still becoming more difficult overall.   Past medical history significant for left-sided weakness after a stroke.  Also brittle diabetes      Past Medical History:  Diagnosis Date  . Diabetes (HCC)   . Diabetes mellitus without complication (HCC)   . Diastolic dysfunction   . Hypertension   . Immune deficiency disorder (H[facility])   . Menometrorrhagia 04/01/2016  . Stroke [facility])    Past Surgical History:  Procedure Laterality Date  . NO PAST SURGERIES     Social History   Socioeconomic History  . Marital status: Married    Spouse name: Not on file  . Number of children: Not on file  . Years of education: Not on file  . Highest education level: Not on file  Social Needs  . Financial resource strain: Not on file  . Food insecurity - worry: Not on file  . Food insecurity - inability: Not on file  . Transportation needs - medical: Not on file  . Transportation needs - non-medical: Not on file  Occupational History  . Not on file  Tobacco Use  . Smoking status: Never Smoker  . Smokeless tobacco: Never Used  Substance and Sexual Activity  . Alcohol use: No    Alcohol/week: 0.0 oz    Comment: occ  . Drug use: No  . Sexual activity: Yes    Birth control/protection: None  Other Topics Concern  . Not on file  Social History Narrative   ** Merged History Encounter **       Allergies  Allergen Reactions  . Metformin And Related Diarrhea   Family History    Problem Relation Age of Onset  . Diabetes Father   . Hypertension Father   . Stroke Father   . Diabetes Paternal Uncle   . Stroke Paternal Uncle   . Stroke Brother   . Cancer Neg Hx   . Breast cancer Neg Hx   . Ovarian cancer Neg Hx      Past medical history, social, surgical and family history all reviewed in electronic medical record.  No pertanent information unless stated regarding to the chief complaint.   Review of Systems:Review of systems updated and as accurate as of 12/04/16  No headache, visual changes, nausea, vomiting, diarrhea, constipation, dizziness, abdominal pain, skin rash, fevers, chills, night sweats, weight loss, swollen lymph nodes, body aches, joint swelling, muscle aches, chest pain, shortness of breath, mood changes.   Objective  There were no vitals taken for this visit. Systems examined below as of 12/04/16   General: No apparent distress alert and oriented x3 mood and affect normal, dressed appropriately.  HEENT: Pupils equal, extraocular movements intact  Respiratory: Patient's speak in full sentences and does not appear short of breath  Cardiovascular: No lower extremity edema, non tender, no erythema  Skin: Warm dry intact with no signs of infection or rash on extremities or on axial skeleton.  Abdomen: Soft nontender  Neuro: Cranial  nerves II through XII are intact, neurovascularly intact in all extremities with 2+ DTRs and 2+ pulses.  Lymph: No lymphadenopathy of posterior or anterior cervical chain or axillae bilaterally.  Gait normal with good balance and coordination.  Mild antalgic MSK:  Non tender with full range of motion and good stability and symmetric strength and tone of  elbows, wrist, hip, knee and ankles bilaterally.  Weakness of the left upper extremity.  3 out of 5 strength.  Shoulder: Right Inspection reveals no abnormalities, atrophy or asymmetry. Palpation is diffusely tender. Decreased range of motion still in all planes.   Only 5 degrees of external rotation.  Internal rotation to lateral hip forward flexion to 95 degrees Rotator cuff strength 4+ out of 5 Mild impingement Speeds and Yergason's tests normal. No labral pathology noted with negative Obrien's, negative clunk and good stability. Normal scapular function observed. No painful arc and no drop arm sign. No apprehension sign Contralateral shoulder shows weakness from patient's previous stroke   Procedure: Real-time Ultrasound Guided Injection of right glenohumeral joint Device: GE Logiq Q7  Ultrasound guided injection is preferred based studies that show increased duration, increased effect, greater accuracy, decreased procedural pain, increased response rate with ultrasound guided versus blind injection.  Verbal informed consent obtained.  Time-out conducted.  Noted no overlying erythema, induration, or other signs of local infection.  Skin prepped in a sterile fashion.  Local anesthesia: Topical Ethyl chloride.  With sterile technique and under real time ultrasound guidance:  Joint visualized.  23g 1  inch needle inserted posterior approach. Pictures taken for needle placement. Patient did have injection of 2 cc of 1% lidocaine, 2 cc of 0.5% Marcaine, and 1.0 cc of Kenalog 40 mg/dL. Completed without difficulty  Pain immediately resolved suggesting accurate placement of the medication.  Advised to call if fevers/chills, erythema, induration, drainage, or persistent bleeding.  Images permanently stored and available for review in the ultrasound unit.  Impression: Technically successful ultrasound guided injection.    Impression and Recommendations:     This case required medical decision making of moderate complexity.      Note: This dictation was prepared with Dragon dictation along with smaller phrase technology. Any transcriptional errors that result from this process are unintentional.

## 2016-12-05 ENCOUNTER — Encounter: Payer: Self-pay | Admitting: Family Medicine

## 2016-12-05 ENCOUNTER — Ambulatory Visit: Payer: Self-pay

## 2016-12-05 ENCOUNTER — Ambulatory Visit: Payer: Medicaid Other | Admitting: Family Medicine

## 2016-12-05 VITALS — BP 110/80 | HR 63 | Ht 70.0 in | Wt 201.0 lb

## 2016-12-05 DIAGNOSIS — E11618 Type 2 diabetes mellitus with other diabetic arthropathy: Secondary | ICD-10-CM

## 2016-12-05 DIAGNOSIS — M25511 Pain in right shoulder: Secondary | ICD-10-CM | POA: Diagnosis not present

## 2016-12-05 DIAGNOSIS — E138 Other specified diabetes mellitus with unspecified complications: Secondary | ICD-10-CM

## 2016-12-05 DIAGNOSIS — G8929 Other chronic pain: Secondary | ICD-10-CM

## 2016-12-05 DIAGNOSIS — M7500 Adhesive capsulitis of unspecified shoulder: Secondary | ICD-10-CM | POA: Diagnosis not present

## 2016-12-05 NOTE — Assessment & Plan Note (Signed)
Patient given another injection.  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  Worsening symptoms only need to consider potentially advanced imaging.  Due to patient's diabetes will refer to endocrinology for further evaluation and treatment.  We discussed icing regimen.  Patient will come back and see me again in 4 weeks for further evaluation and treatment.

## 2016-12-05 NOTE — Patient Instructions (Signed)
Good to see you  We injected the shoulder  Lets do a little sliding scale  If blood sugar is above 200 in - 12 units of novolog If above 250- 14 units If above 300- 16 unites If below 140- then 8 units in AM.  At night though continue the 6 units for now unless above 300 then 8 units.  Keep doing oithe exercises at home See me one more time in 4 weeks and should be much better

## 2016-12-06 ENCOUNTER — Ambulatory Visit: Payer: Medicaid Other | Admitting: Physical Therapy

## 2016-12-10 ENCOUNTER — Ambulatory Visit: Payer: Medicaid Other | Admitting: Speech Pathology

## 2016-12-10 ENCOUNTER — Encounter: Payer: Self-pay | Admitting: Speech Pathology

## 2016-12-10 ENCOUNTER — Ambulatory Visit: Payer: Medicaid Other | Attending: Family Medicine | Admitting: Speech Pathology

## 2016-12-10 DIAGNOSIS — R471 Dysarthria and anarthria: Secondary | ICD-10-CM | POA: Diagnosis not present

## 2016-12-10 NOTE — Therapy (Signed)
[facility] [address] [place], [place], [address] Phone: [phone]   Fax:  [phone]  Speech Language Pathology Treatment  Patient Details  Name: Stephanie ShirtsMarva N Lloyd MRN: [id number] Date of Birth: 06/21/1970 Referring Provider: Dr. Arva ChafeNicholas Wendling   Encounter Date: 12/10/2016  End of Session - 12/10/16 1216    Visit Number  3    Number of Visits  9 medicaid auth 3 visits 1st month    Date for SLP Re-Evaluation  12/20/16    Authorization Type  medicaid auth 3 visits 11/26/16 thru 12/26/16    Authorization - Visit Number  2    Authorization - Number of Visits  3    SLP Start Time  0933    SLP Stop Time   1014    SLP Time Calculation (min)  41 min       Past Medical History:  Diagnosis Date  . Diabetes (HCC)   . Diabetes mellitus without complication (HCC)   . Diastolic dysfunction   . Hypertension   . Immune deficiency disorder (HCC)   . Menometrorrhagia 04/01/2016  . Stroke [facility](HCC)     Past Surgical History:  Procedure Laterality Date  . NO PAST SURGERIES      There were no vitals filed for this visit.  Subjective Assessment - 12/10/16 1207    Subjective  "Not as easy to understand her when she is tired" (son)            ADULT SLP TREATMENT - 12/10/16 0935      General Information   Behavior/Cognition  Alert;Cooperative;Pleasant mood      Pain Assessment   Pain Assessment  No/denies pain      Cognitive-Linquistic Treatment   Treatment focused on  Dysarthria    Skilled Treatment  Pt verbalized compensations for dysarthria with mod I. Son present today. HEP for dysarthria with rare min A.  structured speech task with occasional min A to carryover compensations, however as cognitive load of task increased, pt required increase of cues. Simple conversation re: family vacation with occasional  min A to utilize compensations for intelligible speech, however pt reqired frequent questioning cues to  generate cohesive narrative due to cognitive impairments.       Assessment / Recommendations / Plan   Plan  Continue with current plan of care      Progression Toward Goals   Progression toward goals  Progressing toward goals       SLP Education - 12/10/16 1209    Education provided  Yes    Education Details  Added to HEP for dysarthria, compensations for dysarthria, cognitive activities to do daily, environmental compensations for reduced intelligiblity    Person(s) Educated  Patient;Child(ren) son, Stephanie Lloyd   son, Stephanie Lloyd   Methods  Explanation;Demonstration;Verbal cues    Comprehension  Verbalized understanding;Returned demonstration;Verbal cues required;Need further instruction         SLP Long Term Goals - 12/10/16 1216      SLP LONG TERM GOAL #1   Title  pt will complete HEP for dysarthria with rare min A over two sessions    Baseline  she is not completing HEP for dysarthria    Time  5    Period  Weeks    Status  On-going      SLP LONG TERM GOAL #2   Title  Pt will utilize compensations for dysarthria in simple conversation over 5 minutes with rare min A    Baseline  pt not utilizing compensations for dysarthria    Time  5    Period  Weeks    Status  On-going      SLP LONG TERM GOAL #3   Title  Pt will be 100% intellgilble in noisy environment and while walking over 12 minute conversation with rare min A    Baseline  Pt 90% intellgible in noisy environment    Time  5    Period  Weeks    Status  On-going       Plan - 12/10/16 1212    Clinical Impression Statement  Pt's son, Stephanie Lloyd accompanied her to ST today. He reported that he did have a hard time understanding his mom, which increased as she fatigued. Pt required min A for utilization of compensations for dysarthria in HEP and structured tasks. She required occasional min A to carryover the strategies to simple conversation., however as cognitive load increases in conversation, pt required increase in cues. Son verbalized  motivation to work with his mom on her speech and cognition (pt with premorbid cognitive impairment). Continue skilled to maximize pt's intelligibiltiy for successfull communication at home and in the community.    Speech Therapy Frequency  1x /week    Duration  4 weeks will request more visits per POC after 1st month of 3 per medicaid    Treatment/Interventions  SLP instruction and feedback;Compensatory strategies;Internal/external aids;Environmental controls;Patient/family education;Functional tasks;Cueing hierarchy    Potential to Achieve Goals  Fair    Potential Considerations  Ability to learn/carryover information;Previous level of function    SLP Home Exercise Plan  HEP for dysarthria- See pt instructions    Consulted and Agree with Plan of Care  Patient       Patient will benefit from skilled therapeutic intervention in order to improve the following deficits and impairments:   Dysarthria and anarthria    Problem List Patient Active Problem List   Diagnosis Date Noted  . Hypoglycemia due to insulin 08/29/2016  . Diverticulosis 08/29/2016  . 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 Diverticulitis 08/29/2016  . 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 OSA (obstructive sleep apnea) 06/10/2016  . Iron deficiency anemia due to chronic blood loss 04/01/2016  . Menometrorrhagia 04/01/2016  . Upper respiratory infection 03/25/2016  . History of stroke   . History of intracerebral hemorrhage without residual deficit   . Cerebrovascular accident (CVA) due to thrombosis of basilar artery (HCC) 02/28/2016  . Ischemic stroke (HCC) 02/28/2016  . Acute kidney injury (HCC) 10/28/2015  . Acute encephalopathy 10/28/2015  . Diastolic dysfunction   . Stroke (cerebrum) (HCC)   . Focal infarction of brain (HCC) 10/08/2015  . Syncope 08/19/2015  . Hypertensive emergency 07/29/2015  . Hypertensive urgency 07/28/2015  . Hypokalemia  07/28/2015  . Accelerated hypertension 09/21/2014  . Type 2 diabetes mellitus with other circulatory complications (HCC) 09/21/2014  . Hyperlipidemia 09/21/2014  . 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 Hyperlipidemia LDL goal <70 07/29/2014  . Hemorrhagic stroke (HCC) 06/22/2014  . ICH (intracerebral hemorrhage) (HCC) 06/22/2014  . Essential hypertension 05/13/2012    Uri Turnbough, Radene JourneyLaura Ann  MS, CCC-SLP 12/10/2016, 12:17 PM  [facility] [address] [place], [place], [address] Phone: [phone]   Fax:  [phone]   Name: Stephanie ShirtsMarva N Lloyd MRN: [id number] Date of Birth: 10/16/1970

## 2016-12-10 NOTE — Patient Instructions (Signed)
  Group conversations and noisy places [employer], restaurants, in the car) may be harder to be understood  When you are asked to repeat yourself, think Slow and Big and Loud    Cognitive Activities you can do at home:   - Solitaire  - Majong  - Scrabble  - Chess/Checkers  - Crosswords (easy level)  - Education officer, communityUno  - Card Games  - Board Games  - Connect 4  - Simon  - the Memory Game  - Dominoes  - Backgammon  On your computer, tablet or phone:  [employer] Memory Match Game App [employer] [place] Sort it out (easy) Spot the difference  Outburst or Sunocoutburst jr.

## 2016-12-17 ENCOUNTER — Encounter: Payer: Self-pay | Admitting: Speech Pathology

## 2016-12-24 ENCOUNTER — Encounter: Payer: Self-pay | Admitting: Speech Pathology

## 2016-12-24 ENCOUNTER — Ambulatory Visit: Payer: Medicaid Other | Admitting: Speech Pathology

## 2016-12-24 ENCOUNTER — Other Ambulatory Visit: Payer: Self-pay

## 2016-12-24 DIAGNOSIS — R471 Dysarthria and anarthria: Secondary | ICD-10-CM

## 2016-12-24 NOTE — Therapy (Signed)
[facility] [address] [place], [place], [address] Phone: [phone]   Fax:  [phone]  Speech Language Pathology Treatment  Patient Details  Name: Stephanie Lloyd MRN: [id number] Date of Birth: [birth year] Referring Provider: Dr. Riki Sheer   Encounter Date: 12/24/2016  End of Session - 12/24/16 1019    Visit Number  4    Number of Visits  9    Date for SLP Re-Evaluation  12/20/16    Authorization - Visit Number  3    Authorization - Number of Visits  3    SLP Start Time  0932    SLP Stop Time   1517    SLP Time Calculation (min)  46 min       Past Medical History:  Diagnosis Date  . Diabetes ([place])   . Diabetes mellitus without complication ([place])   . Diastolic dysfunction   . Hypertension   . Immune deficiency disorder ([place])   . Menometrorrhagia 04/01/2016  . Stroke [facility])     Past Surgical History:  Procedure Laterality Date  . NO PAST SURGERIES      There were no vitals filed for this visit.  Subjective Assessment - 12/24/16 0947    Subjective  "My husband is in the hospital - I didn't get to practice much"    Patient is accompained by:  Family member son            ADULT SLP TREATMENT - 12/24/16 0951      General Information   Behavior/Cognition  Alert;Cooperative;Pleasant mood      Pain Assessment   Pain Assessment  No/denies pain      Cognitive-Linquistic Treatment   Treatment focused on  Dysarthria    Skilled Treatment  Pt verbalized compensations for dysarthria with min questioning cues. Pt performed HEP for dysarthria with rare min A. Pt required usual min A to carryover strategies in structured speech tasks, especially as cognitive load increased. Instructed pt and her son to continue speech exercises at home, as well as cognitive activities.      Assessment / Recommendations / Plan   Plan  Discharge SLP treatment due to (comment) medicaid did not approve request for  more visits      Progression Toward Goals   Progression toward goals  Progressing toward goals       SLP Education - 12/24/16 1003    Education provided  Yes    Education Details  continue daily HEP and cognitive activities    Person(s) Educated  Patient;Child(ren)    Methods  Explanation;Demonstration    Comprehension  Verbalized understanding        SPEECH THERAPY DISCHARGE SUMMARY  Visits from Start of Care: 4  Current functional level related to goals / functional outcomes: See goals below   Remaining deficits: Mild to moderate dysarthria; cognitive impairment   Education / Equipment: HEP for dysarthria, compensations for dysarthria, cognitive activities to do at home Plan: Patient agrees to discharge.  Patient goals were partially met. Patient is being discharged due to financial reasons.  ?????       SLP Long Term Goals - 12/24/16 1019      SLP LONG TERM GOAL #1   Title  pt will complete HEP for dysarthria with rare min A over two sessions    Baseline  she is not completing HEP for dysarthria    Time  5    Period  Weeks    Status  Achieved  SLP LONG TERM GOAL #2   Title  Pt will utilize compensations for dysarthria in simple conversation over 5 minutes with rare min A    Baseline  pt not utilizing compensations for dysarthria    Time  5    Period  Weeks    Status  Not Met      SLP LONG TERM GOAL #3   Title  Pt will be 100% intellgilble in noisy environment and while walking over 12 minute conversation with rare min A    Baseline  Pt 90% intellgible in noisy environment    Time  5    Period  Weeks    Status  Not Met       Plan - 12/24/16 1005    Clinical Impression Statement  Pt continues to have mild to moderate dysarthria. She has had inconsistent carryover of HEP due to husband's illness. Pt provided a packet for ongoing speech practice. D/C ST at this time due to medicaid did not approve request for more visits.     Speech Therapy Frequency   1x /week    Duration  4 weeks    Treatment/Interventions  SLP instruction and feedback;Compensatory strategies;Internal/external aids;Environmental controls;Patient/family education;Functional tasks;Cueing hierarchy    Potential to Achieve Goals  Fair    Potential Considerations  Ability to learn/carryover information;Previous level of function    SLP Home Exercise Plan  HEP for dysarthria-     Consulted and Agree with Plan of Care  Patient       Patient will benefit from skilled therapeutic intervention in order to improve the following deficits and impairments:   Dysarthria and anarthria    Problem List Patient Active Problem List   Diagnosis Date Noted  . Hypoglycemia due to insulin 08/29/2016  . Diverticulosis 08/29/2016  . 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 Diverticulitis 08/29/2016  . 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 OSA (obstructive sleep apnea) 06/10/2016  . Iron deficiency anemia due to chronic blood loss 04/01/2016  . Menometrorrhagia 04/01/2016  . Upper respiratory infection 03/25/2016  . History of stroke   . History of intracerebral hemorrhage without residual deficit   . Cerebrovascular accident (CVA) due to thrombosis of basilar artery ([place]) 02/28/2016  . Ischemic stroke ([place]) 02/28/2016  . Acute kidney injury ([place]) 10/28/2015  . Acute encephalopathy 10/28/2015  . Diastolic dysfunction   . Stroke (cerebrum) ([place])   . Focal infarction of brain ([place]) 10/08/2015  . Syncope 08/19/2015  . Hypertensive emergency 07/29/2015  . Hypertensive urgency 07/28/2015  . Hypokalemia 07/28/2015  . Accelerated hypertension 09/21/2014  . Type 2 diabetes mellitus with other circulatory complications ([place]) 65/46/5035  . Hyperlipidemia 09/21/2014  . Thalamic hemorrhage with stroke ([place]) 07/29/2014  . Hyperlipidemia LDL goal <70 07/29/2014  . Hemorrhagic stroke ([place]) 06/22/2014  . ICH (intracerebral  hemorrhage) ([place]) 06/22/2014  . Essential hypertension 05/13/2012    Brittie Whisnant, Annye Rusk MS, CCC-SLP 12/24/2016, 10:23 AM  [facility] [address] [place], [place], [address] Phone: [phone]   Fax:  [phone]   Name: Stephanie Lloyd MRN: [id number] Date of Birth: Jun 22, 1970

## 2017-01-02 ENCOUNTER — Encounter: Payer: Self-pay | Admitting: Family Medicine

## 2017-01-02 ENCOUNTER — Ambulatory Visit: Payer: Medicaid Other | Admitting: Family Medicine

## 2017-01-02 DIAGNOSIS — E11618 Type 2 diabetes mellitus with other diabetic arthropathy: Secondary | ICD-10-CM

## 2017-01-02 DIAGNOSIS — M7500 Adhesive capsulitis of unspecified shoulder: Secondary | ICD-10-CM | POA: Diagnosis not present

## 2017-01-02 NOTE — Patient Instructions (Signed)
Good to see you  Stephanie Lloyd is your friend.  Keep working on the movement.  You are doing great  See me again in 6-8 weeks just in case and can repeat another injection if needed  Happy New Year!

## 2017-01-02 NOTE — Assessment & Plan Note (Signed)
Patient is doing relatively well overall.  We can repeat injections if necessary.  I do believe the patient already having an discontinuing of pain and patient will do very well with conservative therapy.  Follow-up with me again in 2 months

## 2017-01-02 NOTE — Progress Notes (Signed)
Stephanie Lloyd D.O. [facility] [address] [place], [place] [address] Phone: [phone] Subjective:       CC: Shoulder pain follow-up  BJY:[id number]:Subjective  Stephanie ShirtsMarva N Lloyd is a 46 y.o. female coming in for follow up for right shoulder pain. She said that she have overall improved. She cannot perform full flexion but states that she is better.  States that the pain is about 85% better.  Still having lack of range of motion.  Was given injection at last follow-up.      Past Medical History:  Diagnosis Date  . Diabetes (HCC)   . Diabetes mellitus without complication (HCC)   . Diastolic dysfunction   . Hypertension   . Immune deficiency disorder (HCC)   . Menometrorrhagia 04/01/2016  . Stroke [facility](HCC)    Past Surgical History:  Procedure Laterality Date  . NO PAST SURGERIES     Social History   Socioeconomic History  . Marital status: Married    Spouse name: None  . Number of children: None  . Years of education: None  . Highest education level: None  Social Needs  . Financial resource strain: None  . Food insecurity - worry: None  . Food insecurity - inability: None  . Transportation needs - medical: None  . Transportation needs - non-medical: None  Occupational History  . None  Tobacco Use  . Smoking status: Never Smoker  . Smokeless tobacco: Never Used  Substance and Sexual Activity  . Alcohol use: No    Alcohol/week: 0.0 oz    Comment: occ  . Drug use: No  . Sexual activity: Yes    Birth control/protection: None  Other Topics Concern  . None  Social History Narrative   ** Merged History Encounter **       Allergies  Allergen Reactions  . Metformin And Related Diarrhea   Family History  Problem Relation Age of Onset  . Diabetes Father   . Hypertension Father   . Stroke Father   . Diabetes Paternal Uncle   . Stroke Paternal Uncle   . Stroke Brother   . Cancer Neg Hx   . Breast cancer Neg Hx   . Ovarian cancer Neg Hx      Past medical  history, social, surgical and family history all reviewed in electronic medical record.  No pertanent information unless stated regarding to the chief complaint.   Review of Systems:Review of systems updated and as accurate as of 01/02/17  No headache, visual changes, nausea, vomiting, diarrhea, constipation, dizziness, abdominal pain, skin rash, fevers, chills, night sweats, weight loss, swollen lymph nodes, body aches, joint swelling,  chest pain, shortness of breath, mood changes.  Positive muscle aches  Objective  Blood pressure 128/78, pulse (!) 53, height 5\' 10"  (1.778 m), weight 196 lb (88.9 kg), SpO2 (!) 83 %. Systems examined below as of 01/02/17   General: No apparent distress alert and oriented x3 mood and affect normal, dressed appropriately.  HEENT: Pupils equal, extraocular movements intact  Respiratory: Patient's speak in full sentences and does not appear short of breath  Cardiovascular: No lower extremity edema, non tender, no erythema  Skin: Warm dry intact with no signs of infection or rash on extremities or on axial skeleton.  Abdomen: Soft nontender  Neuro: Cranial nerves II through XII are intact, neurovascularly intact in all extremities with 2+ DTRs and 2+ pulses.  Lymph: No lymphadenopathy of posterior or anterior cervical chain or axillae bilaterally.  Gait mild antalgic  MSK:  MSK:  Non tender with full range of motion and good stability and symmetric strength and tone of  elbows, wrist, hip, knee and ankles bilaterally.  Weakness of the left upper extremity.  3 out of 5 strength. Right shoulder exam shows the patient does have some improving range of motion.  Has about 70 degrees of external range of motion, does have internal range of motion to sacrum.  Forward flexion to 115 degrees.  Rotator cuff strength 4+ out of 5 still noted.  Nontender on exam with palpation. Contralateral shoulder has 3 out of 5 strength secondary to patient's previous stroke     Impression  and Recommendations:     This case required medical decision making of moderate complexity.      Note: This dictation was prepared with Dragon dictation along with smaller phrase technology. Any transcriptional errors that result from this process are unintentional.

## 2017-01-16 ENCOUNTER — Encounter: Payer: Self-pay | Admitting: Family Medicine

## 2017-01-16 ENCOUNTER — Ambulatory Visit (INDEPENDENT_AMBULATORY_CARE_PROVIDER_SITE_OTHER): Payer: Medicaid Other | Admitting: Family Medicine

## 2017-01-16 VITALS — BP 136/86 | HR 69 | Temp 97.6°F | Resp 16 | Ht 70.0 in | Wt 194.6 lb

## 2017-01-16 DIAGNOSIS — I639 Cerebral infarction, unspecified: Secondary | ICD-10-CM | POA: Diagnosis not present

## 2017-01-16 DIAGNOSIS — I10 Essential (primary) hypertension: Secondary | ICD-10-CM

## 2017-01-16 DIAGNOSIS — F419 Anxiety disorder, unspecified: Secondary | ICD-10-CM | POA: Insufficient documentation

## 2017-01-16 DIAGNOSIS — E785 Hyperlipidemia, unspecified: Secondary | ICD-10-CM

## 2017-01-16 DIAGNOSIS — R252 Cramp and spasm: Secondary | ICD-10-CM | POA: Diagnosis not present

## 2017-01-16 DIAGNOSIS — E1159 Type 2 diabetes mellitus with other circulatory complications: Secondary | ICD-10-CM | POA: Diagnosis not present

## 2017-01-16 MED ORDER — GLUCOSE BLOOD VI STRP: ORAL_STRIP | 12 refills | Status: DC

## 2017-01-16 MED ORDER — ESCITALOPRAM OXALATE 10 MG PO TABS: 10.0000 mg | ORAL_TABLET | Freq: Every day | ORAL | 2 refills | Status: DC

## 2017-01-16 MED ORDER — LISINOPRIL 40 MG PO TABS: 40.0000 mg | ORAL_TABLET | Freq: Every day | ORAL | 3 refills | Status: DC

## 2017-01-16 NOTE — Assessment & Plan Note (Signed)
Start lexapro 10 mg daily and rto 1 month or sooner prn

## 2017-01-16 NOTE — Assessment & Plan Note (Signed)
Check labs Try hylands for leg cramps rto prn

## 2017-01-16 NOTE — Patient Instructions (Signed)

## 2017-01-16 NOTE — Assessment & Plan Note (Signed)
Tolerating statin, encouraged heart healthy diet, avoid trans fats, minimize simple carbs and saturated fats. Increase exercise as tolerated 

## 2017-01-16 NOTE — Assessment & Plan Note (Signed)
Still with speech slurring  No new symptoms

## 2017-01-16 NOTE — Assessment & Plan Note (Signed)
[id number] to be done, minimize simple carbs. Increase exercise as tolerated. Continue current meds  

## 2017-01-16 NOTE — Progress Notes (Signed)
Patient ID: Stephanie Lloyd, female   DOB: 10-09-1970, 46 y.o.   MRN: [id number]     Subjective:  I acted as a Education administrator for Dr. Carollee Herter.  Guerry Bruin, [place]nd[place]   Patient ID: Stephanie Lloyd, female    DOB: 24-Jan-1971, 46 y.o.   MRN: [id number]  Chief Complaint  Patient presents with  . Diabetes    HPI  Patient is in today for follow up diabetes, bp and cholesterol    She is also under a lot of stress due to husband being in the hosp with sickle cell crisis at [facility] for last 2 months.  Her son is with her and c/o he having inc anxiety.    Patient Care Team: Carollee Herter, Alferd Apa, DO as PCP - General (Family Medicine) Marylynn Pearson, MD as Consulting Physician (Ophthalmology)   Past Medical History:  Diagnosis Date  . Diabetes ([place])   . Diabetes mellitus without complication ([place])   . Diastolic dysfunction   . Hypertension   . Immune deficiency disorder ([place])   . Menometrorrhagia 04/01/2016  . Stroke [facility])     Past Surgical History:  Procedure Laterality Date  . NO PAST SURGERIES      Family History  Problem Relation Age of Onset  . Diabetes Father   . Hypertension Father   . Stroke Father   . Diabetes Paternal Uncle   . Stroke Paternal Uncle   . Stroke Brother   . Cancer Neg Hx   . Breast cancer Neg Hx   . Ovarian cancer Neg Hx     Social History   Socioeconomic History  . Marital status: Married    Spouse name: Not on file  . Number of children: Not on file  . Years of education: Not on file  . Highest education level: Not on file  Social Needs  . Financial resource strain: Not on file  . Food insecurity - worry: Not on file  . Food insecurity - inability: Not on file  . Transportation needs - medical: Not on file  . Transportation needs - non-medical: Not on file  Occupational History  . Not on file  Tobacco Use  . Smoking status: Never Smoker  . Smokeless tobacco: Never Used  Substance and Sexual Activity  . Alcohol use: No    Alcohol/week: 0.0 oz   Comment: occ  . Drug use: No  . Sexual activity: Yes    Birth control/protection: None  Other Topics Concern  . Not on file  Social History Narrative   ** Merged History Encounter **        Outpatient Medications Prior to Visit  Medication Sig Dispense Refill  . ACCU-CHEK FASTCLIX LANCETS MISC Check blood sugar twice daily. Dx E:11.9 102 each 12  . amLODipine (NORVASC) 10 MG tablet Take 1 tablet (10 mg total) by mouth daily. 90 tablet 3  . aspirin EC 325 MG EC tablet Take 1 tablet (325 mg total) by mouth daily. 30 tablet 11  . Blood Glucose Monitoring Suppl (ACCU-CHEK GUIDE) w/Device KIT 1 Device by Does not apply route as directed. Check blood sugar daily 1 kit 0  . Cyanocobalamin (VITAMIN B-12 PO) Take 1 tablet by mouth daily with breakfast.    . docusate sodium (COLACE) 100 MG capsule Take 1 capsule (100 mg total) by mouth 2 (two) times daily. 30 capsule 1  . fluticasone (FLONASE) 50 MCG/ACT nasal spray Place 2 sprays into both nostrils daily. 16 g 1  . imiquimod (ALDARA) 5 %  cream Apply topically 3 (three) times a week. Apply until total clearance or maximum of 16 weeks 24 each 5  . insulin aspart protamine - aspart (NOVOLOG 70/30 MIX) (70-30) 100 UNIT/ML FlexPen 10 u sq q am and 5u sq qpm 15 mL 11  . Insulin Pen Needle (B-D UF III MINI PEN NEEDLES) 31G X 5 MM MISC To use with insulin 100 each 2  . pioglitazone (ACTOS) 30 MG tablet Take 1 tablet (30 mg total) by mouth daily. 30 tablet 2  . polyethylene glycol (MIRALAX / GLYCOLAX) packet Take 17 g by mouth daily. 14 each 0  . prednisoLONE acetate (PRED FORTE) 1 % ophthalmic suspension Place 1 drop into both eyes 2 (two) times daily.     . Pyridoxine HCl (VITAMIN B-6 PO) Take 1 tablet by mouth daily with breakfast.    . Vitamin D, Ergocalciferol, (DRISDOL) 50000 units CAPS capsule Take 1 capsule (50,000 Units total) by mouth every 7 (seven) days. 12 capsule 0  . glucose blood (ACCU-CHEK GUIDE) test strip Check blood sugar twice daily.  Dx E:11.9 100 each 12  . lisinopril (PRINIVIL,ZESTRIL) 40 MG tablet Take 1 tablet (40 mg total) by mouth daily. 90 tablet 3   No facility-administered medications prior to visit.     Allergies  Allergen Reactions  . Metformin And Related Diarrhea    Review of Systems  Constitutional: Negative for chills, fever and malaise/fatigue.  HENT: Negative for congestion and hearing loss.   Eyes: Negative for discharge.  Respiratory: Negative for cough, sputum production and shortness of breath.   Cardiovascular: Negative for chest pain, palpitations and leg swelling.  Gastrointestinal: Negative for abdominal pain, blood in stool, constipation, diarrhea, heartburn, nausea and vomiting.  Genitourinary: Negative for dysuria, frequency, hematuria and urgency.  Musculoskeletal: Negative for back pain, falls and myalgias.  Skin: Negative for rash.  Neurological: Negative for dizziness, sensory change, loss of consciousness, weakness and headaches.  Endo/Heme/Allergies: Negative for environmental allergies. Does not bruise/bleed easily.  Psychiatric/Behavioral: Negative for depression, hallucinations, memory loss, substance abuse and suicidal ideas. The patient is nervous/anxious and has insomnia.        Objective:    Physical Exam  Constitutional: She is oriented to person, place, and time. She appears well-developed and well-nourished.  HENT:  Head: Normocephalic and atraumatic.  Eyes: Conjunctivae and EOM are normal.  Neck: Normal range of motion. Neck supple. No JVD present. Carotid bruit is not present. No thyromegaly present.  Cardiovascular: Normal rate, regular rhythm and normal heart sounds.  No murmur heard. Pulmonary/Chest: Effort normal and breath sounds normal. No respiratory distress. She has no wheezes. She has no rales. She exhibits no tenderness.  Musculoskeletal: She exhibits no edema.  Neurological: She is alert and oriented to person, place, and time.  Psychiatric: Her  mood appears anxious.  Nursing note and vitals reviewed.   BP 136/86   Pulse 69   Temp 97.6 F (36.4 C) (Oral)   Resp 16   Ht '5\' 10"'  (1.778 m)   Wt 194 lb 9.6 oz (88.3 kg)   SpO2 97%   BMI 27.92 kg/m  Wt Readings from Last 3 Encounters:  01/16/17 194 lb 9.6 oz (88.3 kg)  01/02/17 196 lb (88.9 kg)  12/05/16 201 lb (91.2 kg)   BP Readings from Last 3 Encounters:  01/16/17 136/86  01/02/17 128/78  12/05/16 110/80      There is no immunization history on file for this patient.  Health Maintenance  Topic Date Due  .  PNEUMOCOCCAL POLYSACCHARIDE VACCINE (1) 04/05/2017 (Originally 04/29/1970)  . TETANUS/TDAP  04/05/2017 (Originally 04/29/1987)  . INFLUENZA VACCINE  04/27/2017 (Originally 08/28/2016)  . HEMOGLOBIN A1C  03/02/2017  . OPHTHALMOLOGY EXAM  03/28/2017  . FOOT EXAM  07/08/2017  . PAP SMEAR  05/21/2019  . HIV Screening  Completed    Lab Results  Component Value Date   WBC 7.0 09/06/2016   HGB 12.2 09/06/2016   HCT 37.4 09/06/2016   PLT 220.0 09/06/2016   GLUCOSE 156 (H) 09/06/2016   CHOL 199 07/08/2016   TRIG 86.0 07/08/2016   HDL 58.30 07/08/2016   LDLCALC 123 (H) 07/08/2016   ALT 20 08/30/2016   AST 19 08/30/2016   NA 138 09/06/2016   K 3.9 09/06/2016   CL 102 09/06/2016   CREATININE 1.21 (H) 09/06/2016   BUN 20 09/06/2016   CO2 30 09/06/2016   TSH 0.969 08/30/2016   INR 1.26 10/28/2015   HGBA1C 7.2 (H) 08/30/2016    Lab Results  Component Value Date   TSH 0.969 08/30/2016   Lab Results  Component Value Date   WBC 7.0 09/06/2016   HGB 12.2 09/06/2016   HCT 37.4 09/06/2016   MCV 85.5 09/06/2016   PLT 220.0 09/06/2016   Lab Results  Component Value Date   NA 138 09/06/2016   K 3.9 09/06/2016   CHLORIDE 105 04/01/2016   CO2 30 09/06/2016   GLUCOSE 156 (H) 09/06/2016   BUN 20 09/06/2016   CREATININE 1.21 (H) 09/06/2016   BILITOT 0.4 08/30/2016   ALKPHOS 67 08/30/2016   AST 19 08/30/2016   ALT 20 08/30/2016   PROT 6.3 (L) 08/30/2016    ALBUMIN 3.4 (L) 08/30/2016   CALCIUM 9.7 09/06/2016   ANIONGAP 10 08/30/2016   EGFR 58 (L) 04/01/2016   GFR 60.98 09/06/2016   Lab Results  Component Value Date   CHOL 199 07/08/2016   Lab Results  Component Value Date   HDL 58.30 07/08/2016   Lab Results  Component Value Date   LDLCALC 123 (H) 07/08/2016   Lab Results  Component Value Date   TRIG 86.0 07/08/2016   Lab Results  Component Value Date   CHOLHDL 3 07/08/2016   Lab Results  Component Value Date   HGBA1C 7.2 (H) 08/30/2016         Assessment & Plan:   Problem List Items Addressed This Visit      Unprioritized   Anxiety - Primary    Start lexapro 10 mg daily and rto 1 month or sooner prn       Relevant Medications   escitalopram (LEXAPRO) 10 MG tablet   Essential hypertension    Well controlled, no changes to meds. Encouraged heart healthy diet such as the DASH diet and exercise as tolerated.       Relevant Medications   lisinopril (PRINIVIL,ZESTRIL) 40 MG tablet   Hyperlipidemia LDL goal <70    Tolerating statin, encouraged heart healthy diet, avoid trans fats, minimize simple carbs and saturated fats. Increase exercise as tolerated      Relevant Medications   lisinopril (PRINIVIL,ZESTRIL) 40 MG tablet   Other Relevant Orders   Lipid panel   Comprehensive metabolic panel   Ischemic stroke ([place])    Still with speech slurring  No new symptoms       Relevant Medications   lisinopril (PRINIVIL,ZESTRIL) 40 MG tablet   Leg cramps    Check labs Try hylands for leg cramps rto prn  Relevant Orders   CBC with Differential/Platelet   Comprehensive metabolic panel   Magnesium   Type 2 diabetes mellitus with other circulatory complications (HCC)    AQVO7C to be done, minimize simple carbs. Increase exercise as tolerated. Continue current meds       Relevant Medications   glucose blood (ACCU-CHEK GUIDE) test strip   lisinopril (PRINIVIL,ZESTRIL) 40 MG tablet   Other Relevant  Orders   Lipid panel   Hemoglobin A1c   Comprehensive metabolic panel      I am having Erikah N. Bhola start on escitalopram. I am also having her maintain her aspirin, prednisoLONE acetate, amLODipine, Vitamin D (Ergocalciferol), imiquimod, Pyridoxine HCl (VITAMIN B-6 PO), Cyanocobalamin (VITAMIN B-12 PO), polyethylene glycol, docusate sodium, fluticasone, insulin aspart protamine - aspart, ACCU-CHEK FASTCLIX LANCETS, ACCU-CHEK GUIDE, Insulin Pen Needle, pioglitazone, glucose blood, and lisinopril.  Meds ordered this encounter  Medications  . glucose blood (ACCU-CHEK GUIDE) test strip    Sig: Check blood sugar twice daily. Dx E:11.9    Dispense:  100 each    Refill:  12    Accu-chek guide  . lisinopril (PRINIVIL,ZESTRIL) 40 MG tablet    Sig: Take 1 tablet (40 mg total) by mouth daily.    Dispense:  90 tablet    Refill:  3  . escitalopram (LEXAPRO) 10 MG tablet    Sig: Take 1 tablet (10 mg total) by mouth daily.    Dispense:  30 tablet    Refill:  2    CMA served as scribe during this visit. History, Physical and Plan performed by medical provider. Documentation and orders reviewed and attested to.  Ann Held, DO

## 2017-01-16 NOTE — Assessment & Plan Note (Signed)
Well controlled, no changes to meds. Encouraged heart healthy diet such as the DASH diet and exercise as tolerated.  °

## 2017-01-19 ENCOUNTER — Other Ambulatory Visit: Payer: Self-pay | Admitting: Family Medicine

## 2017-01-19 DIAGNOSIS — Z09 Encounter for follow-up examination after completed treatment for conditions other than malignant neoplasm: Secondary | ICD-10-CM

## 2017-01-30 ENCOUNTER — Telehealth: Payer: Self-pay | Admitting: Family Medicine

## 2017-01-30 NOTE — Telephone Encounter (Signed)
Copied from CRM [phone]. Topic: Quick Communication - Rx Refill/Question >> Jan 30, 2017  2:51 PM Oneal GroutSebastian, Jennifer S wrote: Has the patient contacted their pharmacy? Yes.     (Agent: If no, request that the patient contact the pharmacy for the refill.)   Preferred Pharmacy (with phone number or street name): [facility]   Agent: Please be advised that RX refills may take up to 3 business days. We ask that you follow-up with your pharmacy. Patient calling and stating medicaid will only cover Veba Plus Glucose Monitor, needs script for monitor and test strips

## 2017-01-30 NOTE — Telephone Encounter (Signed)
Pt  Requesting   veba plus GLUCOSE  Monitor/ strips    LOV  01/16/2017

## 2017-01-31 ENCOUNTER — Ambulatory Visit (INDEPENDENT_AMBULATORY_CARE_PROVIDER_SITE_OTHER): Payer: Medicaid Other | Admitting: Endocrinology

## 2017-01-31 ENCOUNTER — Telehealth: Payer: Self-pay | Admitting: *Deleted

## 2017-01-31 ENCOUNTER — Encounter: Payer: Self-pay | Admitting: Endocrinology

## 2017-01-31 VITALS — BP 130/90 | HR 70 | Wt 201.2 lb

## 2017-01-31 DIAGNOSIS — E1159 Type 2 diabetes mellitus with other circulatory complications: Secondary | ICD-10-CM | POA: Diagnosis not present

## 2017-01-31 LAB — POCT GLYCOSYLATED HEMOGLOBIN (HGB A1C): Hemoglobin A1C: 7.3

## 2017-01-31 MED ORDER — GLUCOSE BLOOD VI STRP: ORAL_STRIP | 1 refills | Status: DC

## 2017-01-31 MED ORDER — INSULIN ASPART PROT & ASPART (70-30 MIX) 100 UNIT/ML PEN: 5.0000 [IU] | PEN_INJECTOR | Freq: Every day | SUBCUTANEOUS | 11 refills | Status: DC

## 2017-01-31 MED ORDER — LINAGLIPTIN 5 MG PO TABS: 5.0000 mg | ORAL_TABLET | Freq: Every day | ORAL | 11 refills | Status: DC

## 2017-01-31 MED ORDER — ACCU-CHEK AVIVA PLUS W/DEVICE KIT: PACK | 0 refills | Status: DC

## 2017-01-31 MED ORDER — ACCU-CHEK SOFTCLIX LANCETS MISC: 1 refills | Status: AC

## 2017-01-31 NOTE — Telephone Encounter (Signed)
Patient states that ins will cover Accu check Aviva Plus.  Supplies sent in.

## 2017-01-31 NOTE — Telephone Encounter (Signed)
See other phone note   Copied from CRM [phone]. Topic: Quick Communication - Rx Refill/Question >> Jan 30, 2017  2:51 PM Oneal GroutSebastian, Jennifer S wrote: Has the patient contacted their pharmacy? Yes.     (Agent: If no, request that the patient contact the pharmacy for the refill.)   Preferred Pharmacy (with phone number or street name): [facility] [place]   Agent: Please be advised that RX refills may take up to 3 business days. We ask that you follow-up with your pharmacy. Patient calling and stating medicaid will only cover Veba Plus Glucose Monitor, needs script for monitor and test strips >> Jan 31, 2017 11:48 AM Alexander BergeronBarksdale, Harvey B wrote: Pt called to get a call back about a glucose machine to discuss w/ the office, contact pt

## 2017-01-31 NOTE — Telephone Encounter (Signed)
Husband will have patient to call back with what kind of machine she need.

## 2017-01-31 NOTE — Progress Notes (Signed)
Subjective:    Patient ID: Stephanie Lloyd, female    DOB: 03/28/1970, 47 y.o.   MRN: [id number]  HPI pt is referred by Dr Mikle Bosworth, for diabetes.  Pt states DM was dx'ed in 1998; she has mild neuropathy of the lower extremities; she has associated CVA and renal insuff; she has been on insulin since soon after dx; pt says her diet and exercise are fair; she has never had GDM, pancreatitis, pancreatic surgery, or DKA.  She says cbg's vary from 50-300.  There is no trend throughout the day.  Last episode of severe hypoglycemia was mid-2018.   Past Medical History:  Diagnosis Date  . Diabetes ([place])   . Diabetes mellitus without complication ([place])   . Diastolic dysfunction   . Hypertension   . Immune deficiency disorder ([place])   . Menometrorrhagia 04/01/2016  . Stroke [facility])     Past Surgical History:  Procedure Laterality Date  . NO PAST SURGERIES      Social History   Socioeconomic History  . Marital status: Married    Spouse name: Not on file  . Number of children: Not on file  . Years of education: Not on file  . Highest education level: Not on file  Social Needs  . Financial resource strain: Not on file  . Food insecurity - worry: Not on file  . Food insecurity - inability: Not on file  . Transportation needs - medical: Not on file  . Transportation needs - non-medical: Not on file  Occupational History  . Not on file  Tobacco Use  . Smoking status: Never Smoker  . Smokeless tobacco: Never Used  Substance and Sexual Activity  . Alcohol use: No    Alcohol/week: 0.0 oz    Comment: occ  . Drug use: No  . Sexual activity: Yes    Birth control/protection: None  Other Topics Concern  . Not on file  Social History Narrative   ** Merged History Encounter **        Current Outpatient Medications on File Prior to Visit  Medication Sig Dispense Refill  . amLODipine (NORVASC) 10 MG tablet Take 1 tablet (10 mg total) by mouth daily. 90 tablet 3  . aspirin EC 325 MG EC tablet  Take 1 tablet (325 mg total) by mouth daily. 30 tablet 11  . Cyanocobalamin (VITAMIN B-12 PO) Take 1 tablet by mouth daily with breakfast.    . fluticasone (FLONASE) 50 MCG/ACT nasal spray Place 2 sprays into both nostrils daily. 16 g 1  . Insulin Pen Needle (B-D UF III MINI PEN NEEDLES) 31G X 5 MM MISC To use with insulin 100 each 2  . lisinopril (PRINIVIL,ZESTRIL) 40 MG tablet Take 1 tablet (40 mg total) by mouth daily. 90 tablet 3  . pioglitazone (ACTOS) 30 MG tablet Take 1 tablet (30 mg total) by mouth daily. 30 tablet 2  . polyethylene glycol (MIRALAX / GLYCOLAX) packet Take 17 g by mouth daily. 14 each 0  . prednisoLONE acetate (PRED FORTE) 1 % ophthalmic suspension Place 1 drop into both eyes 2 (two) times daily.     . Pyridoxine HCl (VITAMIN B-6 PO) Take 1 tablet by mouth daily with breakfast.    . ACCU-CHEK SOFTCLIX LANCETS lancets Use as directed twice a day.  Dx Code: E11.9 100 each 1  . Blood Glucose Monitoring Suppl (ACCU-CHEK AVIVA PLUS) w/Device KIT Use as directed twice a day.  Dx Code: E11.9 1 kit 0  . docusate sodium (  COLACE) 100 MG capsule Take 1 capsule (100 mg total) by mouth 2 (two) times daily. (Patient not taking: Reported on 01/31/2017) 30 capsule 1  . escitalopram (LEXAPRO) 10 MG tablet Take 1 tablet (10 mg total) by mouth daily. (Patient not taking: Reported on 01/31/2017) 30 tablet 2  . glucose blood (ACCU-CHEK AVIVA PLUS) test strip Use as directed twice a day.  Dx Code: E11.9 100 each 1  . imiquimod (ALDARA) 5 % cream Apply topically 3 (three) times a week. Apply until total clearance or maximum of 16 weeks (Patient not taking: Reported on 01/31/2017) 24 each 5  . Vitamin D, Ergocalciferol, (DRISDOL) 50000 units CAPS capsule Take 1 capsule (50,000 Units total) by mouth every 7 (seven) days. (Patient not taking: Reported on 01/31/2017) 12 capsule 0   No current facility-administered medications on file prior to visit.     Allergies  Allergen Reactions  . Metformin And  Related Diarrhea    Family History  Problem Relation Age of Onset  . Diabetes Father   . Hypertension Father   . Stroke Father   . Diabetes Paternal Uncle   . Stroke Paternal Uncle   . Stroke Brother   . Cancer Neg Hx   . Breast cancer Neg Hx   . Ovarian cancer Neg Hx     BP 130/90 (BP Location: Left Arm, Patient Position: Sitting, Cuff Size: Normal)   Pulse 70   Wt 201 lb 3.2 oz (91.3 kg)   SpO2 97%   BMI 28.87 kg/m    Review of Systems denies blurry vision, headache, chest pain, sob, n/v, excessive diaphoresis, depression, rhinorrhea, and easy bruising.  She has gained weight.  She has frequent urination, depression, memory loss, cold intolerance, easy bruising, and leg cramps.      Objective:   Physical Exam VS: see vs page GEN: no distress HEAD: head: no deformity eyes: no periorbital swelling, no proptosis external nose and ears are normal mouth: no lesion seen NECK: supple, thyroid is not enlarged CHEST WALL: no deformity LUNGS: clear to auscultation CV: reg rate and rhythm, no murmur ABD: abdomen is soft, nontender.  no hepatosplenomegaly.  not distended.  no hernia MUSCULOSKELETAL: muscle bulk and strength are grossly normal.  no obvious joint swelling.  gait is normal and steady EXTEMITIES: no deformity.  no ulcer on the feet.  feet are of normal color and temp.  no edema.  PULSES: dorsalis pedis intact bilat.  no carotid bruit NEURO:  cn 2-12 grossly intact, except for slurred speech.   readily moves all 4's, but strength is decreased on the right side.  sensation is intact to touch on the feet.    SKIN:  Normal texture and temperature.  No rash or suspicious lesion is visible.   NODES:  None palpable at the neck.  PSYCH: alert, well-oriented.  Does not appear anxious nor depressed.   Lab Results  Component Value Date   HGBA1C 7.3 01/31/2017   Lab Results  Component Value Date   CREATININE 1.21 (H) 09/06/2016   BUN 20 09/06/2016   NA 138 09/06/2016    K 3.9 09/06/2016   CL 102 09/06/2016   CO2 30 09/06/2016   I personally reviewed electrocardiogram tracing (08/05/16): Indication: hypoglycemia Impression: NSR with long QT.  No MI.  No hypertrophy. NS-T changes Compared to earlier in 2018: LVH and SB are resolved.     Assessment & Plan:  type 2 DM:, with CVA: she may be manageable off insulin.  memory  loss: this limits complexity of regimen. Renal insuff: this limits rx options.  Patient Instructions  good diet and exercise significantly improve the control of your diabetes.  please let me know if you wish to be referred to a dietician.  high blood sugar is very risky to your health.  you should see an eye doctor and dentist every year.  It is very important to get all recommended vaccinations.  Controlling your blood pressure and cholesterol drastically reduces the damage diabetes does to your body.  Those who smoke should quit.  Please discuss these with your doctor.  check your blood sugar twice a day.  vary the time of day when you check, between before the 3 meals, and at bedtime.  also check if you have symptoms of your blood sugar being too high or too low.  please keep a record of the readings and bring it to your next appointment here (or you can bring the meter itself).  You can write it on any piece of paper.  please call us sooner if your blood sugar goes below 70, or if you have a lot of readings over 200. We will need to take this complex situation in stages.  For now, please:  Add "tradjenta."  I have sent a prescription to your pharmacy, and:  Reduce the insulin to 5 units with breakfast, and none with supper. Please call or message [place] next week, to tell us how the blood sugar is doing.   If necessary, we can add "farxiga."  Please come back for a follow-up appointment in 2 months.

## 2017-01-31 NOTE — Patient Instructions (Addendum)
good diet and exercise significantly improve the control of your diabetes.  please let me know if you wish to be referred to a dietician.  high blood sugar is very risky to your health.  you should see an eye doctor and dentist every year.  It is very important to get all recommended vaccinations.  Controlling your blood pressure and cholesterol drastically reduces the damage diabetes does to your body.  Those who smoke should quit.  Please discuss these with your doctor.  check your blood sugar twice a day.  vary the time of day when you check, between before the 3 meals, and at bedtime.  also check if you have symptoms of your blood sugar being too high or too low.  please keep a record of the readings and bring it to your next appointment here (or you can bring the meter itself).  You can write it on any piece of paper.  please call us sooner if your blood sugar goes below 70, or if you have a lot of readings over 200. We will need to take this complex situation in stages.  For now, please:  Add "tradjenta."  I have sent a prescription to your pharmacy, and:  Reduce the insulin to 5 units with breakfast, and none with supper. Please call or message us next week, to tell us how the blood sugar is doing.   If necessary, we can add "farxiga."  Please come back for a follow-up appointment in 2 months.

## 2017-02-03 ENCOUNTER — Telehealth: Payer: Self-pay | Admitting: Endocrinology

## 2017-02-03 NOTE — Telephone Encounter (Signed)
linagliptin (TRADJENTA) 5 MG TABS tablet  Pt stated medicaid will not cover this script, Pt wants to know if can scribe anything else similar  PLEASE ADVISE

## 2017-02-03 NOTE — Telephone Encounter (Signed)
Yes, I just need to know what the alternative is.

## 2017-02-04 NOTE — Telephone Encounter (Signed)
Yes, please.  She can't take metformin due to renal insufficiency

## 2017-02-04 NOTE — Telephone Encounter (Signed)
[facility] but she has to have tried & failed metformin. Would you like me to call & do PA with [facility]?

## 2017-02-04 NOTE — Telephone Encounter (Signed)
I called [employer] to initiate PA & it is currently pending with #[id number]. It also has to be reviewed by pharmacist.

## 2017-02-07 ENCOUNTER — Telehealth: Payer: Self-pay

## 2017-02-07 MED ORDER — LINAGLIPTIN 5 MG PO TABS: 5.0000 mg | ORAL_TABLET | Freq: Every day | ORAL | 11 refills | Status: DC

## 2017-02-07 NOTE — Telephone Encounter (Signed)
I called [employer] & the PA was approved through 02/04/2018. I tried to call patient to find out which pharmacy she preferred, but [facility] [place] since patient didn't answer.

## 2017-02-07 NOTE — Telephone Encounter (Signed)
Patient stated medicaid will not medicationTrajenta she need Everardo Allllison to prescribe another, medication that medicaid will cover. Please advise

## 2017-02-07 NOTE — Telephone Encounter (Signed)
I called and left patient VM explaining that PA was approve by medicaid.. She should be fine to pick uo medication from pharmacy when they have it ready for pick up.

## 2017-02-12 ENCOUNTER — Telehealth: Payer: Self-pay | Admitting: Endocrinology

## 2017-02-12 NOTE — Progress Notes (Signed)
Tawana ScaleZach Smith D.O. [facility] [address] [place], [place] [address] Phone: [phone] Subjective:     CC: Shoulder pain follow-up  OZH:[id number]:Subjective  Stephanie ShirtsMarva N Lloyd is a 47 y.o. female coming in with complaint of found to have shoulder pain.  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  Left-sided weakness secondary to history of stroke.  Patient is in the last exam 1 month ago was doing 85% better.  Patient was to continue conservative therapy.  Patient states continues to have pain.  Patient states that it is maybe been a little bit weaker than what it was previously.      Past Medical History:  Diagnosis Date  . Diabetes (HCC)   . Diabetes mellitus without complication (HCC)   . Diastolic dysfunction   . Hypertension   . Immune deficiency disorder (HCC)   . Menometrorrhagia 04/01/2016  . Stroke [facility](HCC)    Past Surgical History:  Procedure Laterality Date  . NO PAST SURGERIES     Social History   Socioeconomic History  . Marital status: Married    Spouse name: None  . Number of children: None  . Years of education: None  . Highest education level: None  Social Needs  . Financial resource strain: None  . Food insecurity - worry: None  . Food insecurity - inability: None  . Transportation needs - medical: None  . Transportation needs - non-medical: None  Occupational History  . None  Tobacco Use  . Smoking status: Never Smoker  . Smokeless tobacco: Never Used  Substance and Sexual Activity  . Alcohol use: No    Alcohol/week: 0.0 oz    Comment: occ  . Drug use: No  . Sexual activity: Yes    Birth control/protection: None  Other Topics Concern  . None  Social History Narrative   ** Merged History Encounter **       Allergies  Allergen Reactions  . Metformin And Related Diarrhea   Family History  Problem Relation Age of Onset  . Diabetes Father   . Hypertension Father   . Stroke Father   . Diabetes Paternal Uncle   . Stroke Paternal Uncle   .  Stroke Brother   . Cancer Neg Hx   . Breast cancer Neg Hx   . Ovarian cancer Neg Hx      Past medical history, social, surgical and family history all reviewed in electronic medical record.  No pertanent information unless stated regarding to the chief complaint.   Review of Systems:Review of systems updated and as accurate as of 02/13/17  No headache, visual changes, nausea, vomiting, diarrhea, constipation, dizziness, abdominal pain, skin rash, fevers, chills, night sweats, weight loss, swollen lymph nodes, body aches, joint swelling, chest pain, shortness of breath, mood changes.  Positive muscle aches  Objective  Blood pressure (!) 144/78, pulse 63, height 5\' 10"  (1.778 m), weight 201 lb (91.2 kg), SpO2 96 %. Systems examined below as of 02/13/17      General: No apparent distress alert and oriented x3 mood and affect normal, dressed appropriately.  HEENT: Pupils equal, extraocular movements intact  Respiratory: Patient's speak in full sentences and does not appear short of breath  Cardiovascular: No lower extremity edema, non tender, no erythema  Skin: Warm dry intact with no signs of infection or rash on extremities or on axial skeleton.  Abdomen: Soft nontender  Neuro: Cranial nerves II through XII are intact, neurovascularly intact in all extremities with 2+ DTRs  and 2+ pulses.  Lymph: No lymphadenopathy of posterior or anterior cervical chain or axillae bilaterally.  Gait mild antalgic MSK:  MSK: Non tender with full range of motion and good stability and symmetric strength and tone of elbows, wrist, hip, knee and ankles bilaterally. Weakness of the left upper extremity. 3 out of 5 strength. Right shoulder exam shows the patient does have some improving range of motion.  Has about 70 degrees of external range of motion, does have internal range of motion to sacrum.  Forward flexion to 115 degrees.  Rotator cuff strength 3+ out of 5 still noted.  This is worse than previous  exam Nontender on exam with palpation. Contralateral shoulder has 3 out of 5 strength secondary to patient's previous stroke   Procedure: Real-time Ultrasound Guided Injection of right glenohumeral joint Device: GE Logiq Q7  Ultrasound guided injection is preferred based studies that show increased duration, increased effect, greater accuracy, decreased procedural pain, increased response rate with ultrasound guided versus blind injection.  Verbal informed consent obtained.  Time-out conducted.  Noted no overlying erythema, induration, or other signs of local infection.  Skin prepped in a sterile fashion.  Local anesthesia: Topical Ethyl chloride.  With sterile technique and under real time ultrasound guidance:  Joint visualized.  23g 1  inch needle inserted posterior approach. Pictures taken for needle placement. Patient did have injection of 2 cc of 1% lidocaine, 2 cc of 0.5% Marcaine, and 1.0 cc of Kenalog 40 mg/dL. Completed without difficulty  Pain immediately resolved suggesting accurate placement of the medication.  Advised to call if fevers/chills, erythema, induration, drainage, or persistent bleeding.  Images permanently stored and available for review in the ultrasound unit.  Impression: Technically successful ultrasound guided injection.      Impression and Recommendations:     This case required medical decision making of moderate complexity.      Note: This dictation was prepared with Dragon dictation along with smaller phrase technology. Any transcriptional errors that result from this process are unintentional.

## 2017-02-12 NOTE — Telephone Encounter (Signed)
Patient cannot afford Tragenta-can you prescribe a more affordable medication for patient-must be accepted by Medicaid. [facility] [place] ([address] Please let patient know status re: the above at ph# [phone]

## 2017-02-13 ENCOUNTER — Ambulatory Visit (INDEPENDENT_AMBULATORY_CARE_PROVIDER_SITE_OTHER)
Admission: RE | Admit: 2017-02-13 | Discharge: 2017-02-13 | Disposition: A | Discharge status: Home / Self Care | Payer: Medicaid Other | Source: Ambulatory Visit | Attending: Family Medicine | Admitting: Family Medicine

## 2017-02-13 ENCOUNTER — Encounter: Payer: Self-pay | Admitting: Family Medicine

## 2017-02-13 ENCOUNTER — Ambulatory Visit: Payer: Self-pay

## 2017-02-13 ENCOUNTER — Ambulatory Visit: Payer: Medicaid Other | Admitting: Family Medicine

## 2017-02-13 VITALS — BP 144/78 | HR 63 | Ht 70.0 in | Wt 201.0 lb

## 2017-02-13 DIAGNOSIS — G8929 Other chronic pain: Secondary | ICD-10-CM

## 2017-02-13 DIAGNOSIS — M25511 Pain in right shoulder: Secondary | ICD-10-CM

## 2017-02-13 DIAGNOSIS — E11618 Type 2 diabetes mellitus with other diabetic arthropathy: Secondary | ICD-10-CM | POA: Diagnosis not present

## 2017-02-13 DIAGNOSIS — M7500 Adhesive capsulitis of unspecified shoulder: Secondary | ICD-10-CM | POA: Diagnosis not present

## 2017-02-13 NOTE — Patient Instructions (Signed)
Good to see you  Lets go down and get an xray to make sure we are not missing anything Ice is your friend. Do the home exercises at least 3 times a week.  See me again in 3-4 weeks and if not great we will need to consider an MRI

## 2017-02-13 NOTE — Telephone Encounter (Signed)
What is alterrnative?

## 2017-02-13 NOTE — Assessment & Plan Note (Signed)
Discussed with patient this will be the last injection.  Increasing weakness..  Concern for possible rotator cuff pathology that is not being visualized on the ultrasound.  Hopefully patient will make improvement.  Follow-up with me again in 3-4 weeks

## 2017-02-13 NOTE — Telephone Encounter (Signed)
I called patient & left VM for her to call back to clarify that even with PA I am assuming tradjenta is still too expensive? Should alternative be sent?

## 2017-02-17 ENCOUNTER — Ambulatory Visit: Payer: Self-pay | Admitting: Family Medicine

## 2017-02-18 NOTE — Telephone Encounter (Signed)
I have called patient to call back to ask about cost since tradjenta was approved.

## 2017-02-19 NOTE — Telephone Encounter (Signed)
I called & again left VM for patient to call back.

## 2017-02-19 NOTE — Telephone Encounter (Signed)
Pt states that she is returning a call from our office about her medication    Please advise

## 2017-02-20 ENCOUNTER — Ambulatory Visit (INDEPENDENT_AMBULATORY_CARE_PROVIDER_SITE_OTHER): Payer: Medicaid Other | Admitting: Family Medicine

## 2017-02-20 ENCOUNTER — Encounter: Payer: Self-pay | Admitting: Family Medicine

## 2017-02-20 VITALS — BP 132/70 | HR 68 | Temp 97.5°F | Resp 16 | Ht 70.0 in | Wt 199.4 lb

## 2017-02-20 DIAGNOSIS — F419 Anxiety disorder, unspecified: Secondary | ICD-10-CM

## 2017-02-20 NOTE — Progress Notes (Signed)
Patient ID: Stephanie Lloyd, female   DOB: [birth year], 47 y.o.   MRN: [id number]    Subjective:  I acted as a Education administrator for Dr. Carollee Herter.  Stephanie Lloyd, Stephanie Lloyd   Patient ID: Stephanie Lloyd, female    DOB: 02-13-1970, 47 y.o.   MRN: [id number]  Chief Complaint  Patient presents with  . Anxiety    HPI  Patient is in today for follow up anxiety.  Patient states she is doing much better.  Patient Care Team: Carollee Herter, Alferd Apa, DO as PCP - General (Family Medicine) Marylynn Pearson, MD as Consulting Physician (Ophthalmology)   Past Medical History:  Diagnosis Date  . Diabetes ([place])   . Diabetes mellitus without complication ([place])   . Diastolic dysfunction   . Hypertension   . Immune deficiency disorder ([place])   . Menometrorrhagia 04/01/2016  . Stroke [facility])     Past Surgical History:  Procedure Laterality Date  . NO PAST SURGERIES      Family History  Problem Relation Age of Onset  . Diabetes Father   . Hypertension Father   . Stroke Father   . Diabetes Paternal Uncle   . Stroke Paternal Uncle   . Stroke Brother   . Cancer Neg Hx   . Breast cancer Neg Hx   . Ovarian cancer Neg Hx     Social History   Socioeconomic History  . Marital status: Married    Spouse name: Not on file  . Number of children: Not on file  . Years of education: Not on file  . Highest education level: Not on file  Social Needs  . Financial resource strain: Not on file  . Food insecurity - worry: Not on file  . Food insecurity - inability: Not on file  . Transportation needs - medical: Not on file  . Transportation needs - non-medical: Not on file  Occupational History  . Not on file  Tobacco Use  . Smoking status: Never Smoker  . Smokeless tobacco: Never Used  Substance and Sexual Activity  . Alcohol use: No    Alcohol/week: 0.0 oz    Comment: occ  . Drug use: No  . Sexual activity: Yes    Birth control/protection: None  Other Topics Concern  . Not on file  Social History Narrative   **  Merged History Encounter **        Outpatient Medications Prior to Visit  Medication Sig Dispense Refill  . ACCU-CHEK SOFTCLIX LANCETS lancets Use as directed twice a day.  Dx Code: E11.9 100 each 1  . amLODipine (NORVASC) 10 MG tablet Take 1 tablet (10 mg total) by mouth daily. 90 tablet 3  . aspirin EC 325 MG EC tablet Take 1 tablet (325 mg total) by mouth daily. 30 tablet 11  . Blood Glucose Monitoring Suppl (ACCU-CHEK AVIVA PLUS) w/Device KIT Use as directed twice a day.  Dx Code: E11.9 1 kit 0  . Cyanocobalamin (VITAMIN B-12 PO) Take 1 tablet by mouth daily with breakfast.    . docusate sodium (COLACE) 100 MG capsule Take 1 capsule (100 mg total) by mouth 2 (two) times daily. 30 capsule 1  . escitalopram (LEXAPRO) 10 MG tablet Take 1 tablet (10 mg total) by mouth daily. 30 tablet 2  . fluticasone (FLONASE) 50 MCG/ACT nasal spray Place 2 sprays into both nostrils daily. 16 g 1  . glucose blood (ACCU-CHEK AVIVA PLUS) test strip Use as directed twice a day.  Dx Code: E11.9 100 each  1  . imiquimod (ALDARA) 5 % cream Apply topically 3 (three) times a week. Apply until total clearance or maximum of 16 weeks 24 each 5  . insulin aspart protamine - aspart (NOVOLOG 70/30 MIX) (70-30) 100 UNIT/ML FlexPen Inject 0.05 mLs (5 Units total) into the skin daily with breakfast. 10 u sq q am and 5u sq qpm 15 mL 11  . Insulin Pen Needle (B-D UF III MINI PEN NEEDLES) 31G X 5 MM MISC To use with insulin 100 each 2  . linagliptin (TRADJENTA) 5 MG TABS tablet Take 1 tablet (5 mg total) by mouth daily. 30 tablet 11  . lisinopril (PRINIVIL,ZESTRIL) 40 MG tablet Take 1 tablet (40 mg total) by mouth daily. 90 tablet 3  . pioglitazone (ACTOS) 30 MG tablet Take 1 tablet (30 mg total) by mouth daily. 30 tablet 2  . polyethylene glycol (MIRALAX / GLYCOLAX) packet Take 17 g by mouth daily. 14 each 0  . prednisoLONE acetate (PRED FORTE) 1 % ophthalmic suspension Place 1 drop into both eyes 2 (two) times daily.     .  Pyridoxine HCl (VITAMIN B-6 PO) Take 1 tablet by mouth daily with breakfast.    . Vitamin D, Ergocalciferol, (DRISDOL) 50000 units CAPS capsule Take 1 capsule (50,000 Units total) by mouth every 7 (seven) days. 12 capsule 0   No facility-administered medications prior to visit.     Allergies  Allergen Reactions  . Metformin And Related Diarrhea    Review of Systems  Constitutional: Negative for fever and malaise/fatigue.  HENT: Negative for congestion.   Eyes: Negative for blurred vision.  Respiratory: Negative for cough and shortness of breath.   Cardiovascular: Negative for chest pain, palpitations and leg swelling.  Gastrointestinal: Negative for vomiting.  Musculoskeletal: Negative for back pain.  Skin: Negative for rash.  Neurological: Negative for loss of consciousness and headaches.       Objective:    Physical Exam  Constitutional: She is oriented to person, place, and time. She appears well-developed and well-nourished.  HENT:  Head: Normocephalic and atraumatic.  Eyes: Conjunctivae and EOM are normal.  Neck: Normal range of motion. Neck supple. No JVD present. Carotid bruit is not present. No thyromegaly present.  Cardiovascular: Normal rate, regular rhythm and normal heart sounds.  No murmur heard. Pulmonary/Chest: Effort normal and breath sounds normal. No respiratory distress. She has no wheezes. She has no rales. She exhibits no tenderness.  Musculoskeletal: She exhibits no edema.  Neurological: She is alert and oriented to person, place, and time.  Psychiatric: She has a normal mood and affect.  Nursing note and vitals reviewed.   BP 132/70 (BP Location: Left Arm, Cuff Size: Normal)   Pulse 68   Temp (!) 97.5 F (36.4 C) (Oral)   Resp 16   Ht '5\' 10"'  (1.778 m)   Wt 199 lb 6.4 oz (90.4 kg)   LMP  (LMP Unknown) Comment: IUD  SpO2 96%   BMI 28.61 kg/m  Wt Readings from Last 3 Encounters:  02/20/17 199 lb 6.4 oz (90.4 kg)  02/13/17 201 lb (91.2 kg)    01/31/17 201 lb 3.2 oz (91.3 kg)   BP Readings from Last 3 Encounters:  02/20/17 132/70  02/13/17 (!) 144/78  01/31/17 130/90      There is no immunization history on file for this patient.  Health Maintenance  Topic Date Due  . PNEUMOCOCCAL POLYSACCHARIDE VACCINE (1) 04/05/2017 (Originally 04/29/1970)  . TETANUS/TDAP  04/05/2017 (Originally 04/29/1987)  . INFLUENZA  VACCINE  04/27/2017 (Originally 08/28/2016)  . OPHTHALMOLOGY EXAM  03/28/2017  . FOOT EXAM  07/08/2017  . HEMOGLOBIN A1C  07/31/2017  . PAP SMEAR  05/21/2019  . HIV Screening  Completed    Lab Results  Component Value Date   WBC 7.0 09/06/2016   HGB 12.2 09/06/2016   HCT 37.4 09/06/2016   PLT 220.0 09/06/2016   GLUCOSE 156 (H) 09/06/2016   CHOL 199 07/08/2016   TRIG 86.0 07/08/2016   HDL 58.30 07/08/2016   LDLCALC 123 (H) 07/08/2016   ALT 20 08/30/2016   AST 19 08/30/2016   NA 138 09/06/2016   K 3.9 09/06/2016   CL 102 09/06/2016   CREATININE 1.21 (H) 09/06/2016   BUN 20 09/06/2016   CO2 30 09/06/2016   TSH 0.969 08/30/2016   INR 1.26 10/28/2015   HGBA1C 7.3 01/31/2017    Lab Results  Component Value Date   TSH 0.969 08/30/2016   Lab Results  Component Value Date   WBC 7.0 09/06/2016   HGB 12.2 09/06/2016   HCT 37.4 09/06/2016   MCV 85.5 09/06/2016   PLT 220.0 09/06/2016   Lab Results  Component Value Date   NA 138 09/06/2016   K 3.9 09/06/2016   CHLORIDE 105 04/01/2016   CO2 30 09/06/2016   GLUCOSE 156 (H) 09/06/2016   BUN 20 09/06/2016   CREATININE 1.21 (H) 09/06/2016   BILITOT 0.4 08/30/2016   ALKPHOS 67 08/30/2016   AST 19 08/30/2016   ALT 20 08/30/2016   PROT 6.3 (L) 08/30/2016   ALBUMIN 3.4 (L) 08/30/2016   CALCIUM 9.7 09/06/2016   ANIONGAP 10 08/30/2016   EGFR 58 (L) 04/01/2016   GFR 60.98 09/06/2016   Lab Results  Component Value Date   CHOL 199 07/08/2016   Lab Results  Component Value Date   HDL 58.30 07/08/2016   Lab Results  Component Value Date   LDLCALC 123  (H) 07/08/2016   Lab Results  Component Value Date   TRIG 86.0 07/08/2016   Lab Results  Component Value Date   CHOLHDL 3 07/08/2016   Lab Results  Component Value Date   HGBA1C 7.3 01/31/2017         Assessment & Plan:   Problem List Items Addressed This Visit      Unprioritized   Anxiety - Primary    con't meds Pt doing much better   I am having Han N. Coutant maintain her aspirin, prednisoLONE acetate, amLODipine, Vitamin D (Ergocalciferol), imiquimod, Pyridoxine HCl (VITAMIN B-6 PO), Cyanocobalamin (VITAMIN B-12 PO), polyethylene glycol, docusate sodium, fluticasone, Insulin Pen Needle, pioglitazone, lisinopril, escitalopram, insulin aspart protamine - aspart, ACCU-CHEK AVIVA PLUS, glucose blood, ACCU-CHEK SOFTCLIX LANCETS, and linagliptin.  No orders of the defined types were placed in this encounter.   CMA served as Education administrator during this visit. History, Physical and Plan performed by medical provider. Documentation and orders reviewed and attested to.  Ann Held, DO

## 2017-02-20 NOTE — Patient Instructions (Signed)

## 2017-02-21 ENCOUNTER — Telehealth: Payer: Self-pay | Admitting: Family Medicine

## 2017-02-21 DIAGNOSIS — E1159 Type 2 diabetes mellitus with other circulatory complications: Secondary | ICD-10-CM

## 2017-02-21 DIAGNOSIS — Z09 Encounter for follow-up examination after completed treatment for conditions other than malignant neoplasm: Secondary | ICD-10-CM

## 2017-02-21 MED ORDER — PIOGLITAZONE HCL 30 MG PO TABS
30.0000 mg | ORAL_TABLET | Freq: Every day | ORAL | 2 refills | Status: DC
Start: 2017-02-21 — End: 2017-02-28

## 2017-02-21 NOTE — Telephone Encounter (Signed)
Copied from CRM [phone]#[id number]. Topic: Quick Communication - Rx Refill/Question >> Feb 21, 2017 11:08 AM Herby AbrahamJohnson, Shiquita C wrote: Medication: pioglitazone    Has the patient contacted their pharmacy? no   (Agent: If no, request that the patient contact the pharmacy for the refill.)   Preferred Pharmacy (with phone number or street name): [phone] - [place], [place] - [address] [facility] [address]   Agent: Please be advised that RX refills may take up to 3 business days. We ask that you follow-up with your pharmacy.

## 2017-02-28 ENCOUNTER — Telehealth: Payer: Self-pay | Admitting: Family Medicine

## 2017-02-28 ENCOUNTER — Other Ambulatory Visit: Payer: Self-pay

## 2017-02-28 DIAGNOSIS — E1159 Type 2 diabetes mellitus with other circulatory complications: Secondary | ICD-10-CM

## 2017-02-28 DIAGNOSIS — Z09 Encounter for follow-up examination after completed treatment for conditions other than malignant neoplasm: Secondary | ICD-10-CM

## 2017-02-28 MED ORDER — PIOGLITAZONE HCL 30 MG PO TABS
30.0000 mg | ORAL_TABLET | Freq: Every day | ORAL | 2 refills | Status: DC
Start: 2017-02-28 — End: 2017-07-16

## 2017-02-28 NOTE — Telephone Encounter (Signed)
Copied from CRM [phone]#[id number]. Topic: General - Other >> Feb 28, 2017 12:03 PM Percival SpanishKennedy, Cheryl W wrote:  RX [facility] the wrong pharmacy  [facility] [place] and not sure why since that pharmacy is not showing on her file   [facility] [place]    pioglitazone (ACTOS) 30 MG tablet

## 2017-03-04 ENCOUNTER — Ambulatory Visit (INDEPENDENT_AMBULATORY_CARE_PROVIDER_SITE_OTHER): Payer: Medicaid Other | Admitting: Family Medicine

## 2017-03-04 ENCOUNTER — Encounter: Payer: Self-pay | Admitting: Family Medicine

## 2017-03-04 VITALS — BP 110/76 | HR 66 | Temp 98.1°F | Resp 16 | Ht 70.0 in | Wt 197.0 lb

## 2017-03-04 DIAGNOSIS — N3941 Urge incontinence: Secondary | ICD-10-CM

## 2017-03-04 LAB — POC URINALSYSI DIPSTICK (AUTOMATED)
Bilirubin, UA: NEGATIVE
Blood, UA: NEGATIVE
Glucose, UA: NEGATIVE
Ketones, UA: NEGATIVE
Leukocytes, UA: NEGATIVE
NITRITE UA: NEGATIVE
PROTEIN UA: NEGATIVE
Spec Grav, UA: >=1.03 — AB (ref 1.010–1.025)
UROBILINOGEN UA: 0.2 U/dL
pH, UA: 6 (ref 5.0–8.0)

## 2017-03-04 MED ORDER — SOLIFENACIN SUCCINATE 10 MG PO TABS: 10.0000 mg | ORAL_TABLET | Freq: Every day | ORAL | 2 refills | Status: DC

## 2017-03-04 NOTE — Patient Instructions (Signed)
Urinary Incontinence Urinary incontinence is the involuntary loss of urine from your bladder. What are the causes? There are many causes of urinary incontinence. They include:  Medicines.  Infections.  Prostatic enlargement, leading to overflow of urine from your bladder.  Surgery.  Neurological diseases.  Emotional factors.  What are the signs or symptoms? Urinary Incontinence can be divided into four types: 1. Urge incontinence. Urge incontinence is the involuntary loss of urine before you have the opportunity to go to the bathroom. There is a sudden urge to void but not enough time to reach a bathroom. 2. Stress incontinence. Stress incontinence is the sudden loss of urine with any activity that forces urine to pass. It is commonly caused by anatomical changes to the pelvis and sphincter areas of your body. 3. Overflow incontinence. Overflow incontinence is the loss of urine from an obstructed opening to your bladder. This results in a backup of urine and a resultant buildup of pressure within the bladder. When the pressure within the bladder exceeds the closing pressure of the sphincter, the urine overflows, which causes incontinence, similar to water overflowing a dam. 4. Total incontinence. Total incontinence is the loss of urine as a result of the inability to store urine within your bladder.  How is this diagnosed? Evaluating the cause of incontinence may require:  A thorough and complete medical and obstetric history.  A complete physical exam.  Laboratory tests such as a urine culture and sensitivities.  When additional tests are indicated, they can include:  An ultrasound exam.  Kidney and bladder X-rays.  Cystoscopy. This is an exam of the bladder using a narrow scope.  Urodynamic testing to test the nerve function to the bladder and sphincter areas.  How is this treated? Treatment for urinary incontinence depends on the cause:  For urge incontinence caused  by a bacterial infection, antibiotics will be prescribed. If the urge incontinence is related to medicines you take, your health care provider may have you change the medicine.  For stress incontinence, surgery to re-establish anatomical support to the bladder or sphincter, or both, will often correct the condition.  For overflow incontinence caused by an enlarged prostate, an operation to open the channel through the enlarged prostate will allow the flow of urine out of the bladder. In women with fibroids, a hysterectomy may be recommended.  For total incontinence, surgery on your urinary sphincter may help. An artificial urinary sphincter (an inflatable cuff placed around the urethra) may be required. In women who have developed a hole-like passage between their bladder and vagina (vesicovaginal fistula), surgery to close the fistula often is required.  Follow these instructions at home:  Normal daily hygiene and the use of pads or adult diapers that are changed regularly will help prevent odors and skin damage.  Avoid caffeine. It can overstimulate your bladder.  Use the bathroom regularly. Try about every 2-3 hours to go to the bathroom, even if you do not feel the need to do so. Take time to empty your bladder completely. After urinating, wait a minute. Then try to urinate again.  For causes involving nerve dysfunction, keep a log of the medicines you take and a journal of the times you go to the bathroom. Contact a health care provider if:  You experience worsening of pain instead of improvement in pain after your procedure.  Your incontinence becomes worse instead of better. Get help right away if:  You experience fever or shaking chills.  You are unable to   pass your urine.  You have redness spreading into your groin or down into your thighs. This information is not intended to replace advice given to you by your health care provider. Make sure you discuss any questions you have  with your health care provider. Document Released: 02/22/2004 Document Revised: 08/25/2015 Document Reviewed: 06/23/2012 Elsevier Interactive Patient Education  2018 [employer]  

## 2017-03-04 NOTE — Progress Notes (Signed)
Patient ID: Stephanie Lloyd, female   DOB: Apr 11, 1970, 47 y.o.   MRN: [id number]     Subjective:  I acted as a Education administrator for Dr. Carollee Herter.  Guerry Bruin, [place]   Patient ID: Stephanie Lloyd, female    DOB: 1970/12/13, 47 y.o.   MRN: [id number]  Chief Complaint  Patient presents with  . cannot hold urine    HPI  Patient is in today for incontinence.  She can hardly make it to the bathroom with out wetting her self.    Patient Care Team: Carollee Herter, Alferd Apa, DO as PCP - General (Family Medicine) Marylynn Pearson, MD as Consulting Physician (Ophthalmology)   Past Medical History:  Diagnosis Date  . Diabetes ([place])   . Diabetes mellitus without complication ([place])   . Diastolic dysfunction   . Hypertension   . Immune deficiency disorder ([place])   . Menometrorrhagia 04/01/2016  . Stroke [facility])     Past Surgical History:  Procedure Laterality Date  . NO PAST SURGERIES      Family History  Problem Relation Age of Onset  . Diabetes Father   . Hypertension Father   . Stroke Father   . Diabetes Paternal Uncle   . Stroke Paternal Uncle   . Stroke Brother   . Cancer Neg Hx   . Breast cancer Neg Hx   . Ovarian cancer Neg Hx     Social History   Socioeconomic History  . Marital status: Married    Spouse name: Not on file  . Number of children: Not on file  . Years of education: Not on file  . Highest education level: Not on file  Social Needs  . Financial resource strain: Not on file  . Food insecurity - worry: Not on file  . Food insecurity - inability: Not on file  . Transportation needs - medical: Not on file  . Transportation needs - non-medical: Not on file  Occupational History  . Not on file  Tobacco Use  . Smoking status: Never Smoker  . Smokeless tobacco: Never Used  Substance and Sexual Activity  . Alcohol use: No    Alcohol/week: 0.0 oz    Comment: occ  . Drug use: No  . Sexual activity: Yes    Birth control/protection: None  Other Topics Concern  . Not on  file  Social History Narrative   ** Merged History Encounter **        Outpatient Medications Prior to Visit  Medication Sig Dispense Refill  . ACCU-CHEK SOFTCLIX LANCETS lancets Use as directed twice a day.  Dx Code: E11.9 100 each 1  . amLODipine (NORVASC) 10 MG tablet Take 1 tablet (10 mg total) by mouth daily. 90 tablet 3  . aspirin EC 325 MG EC tablet Take 1 tablet (325 mg total) by mouth daily. 30 tablet 11  . Blood Glucose Monitoring Suppl (ACCU-CHEK AVIVA PLUS) w/Device KIT Use as directed twice a day.  Dx Code: E11.9 1 kit 0  . Cyanocobalamin (VITAMIN B-12 PO) Take 1 tablet by mouth daily with breakfast.    . docusate sodium (COLACE) 100 MG capsule Take 1 capsule (100 mg total) by mouth 2 (two) times daily. 30 capsule 1  . escitalopram (LEXAPRO) 10 MG tablet Take 1 tablet (10 mg total) by mouth daily. 30 tablet 2  . fluticasone (FLONASE) 50 MCG/ACT nasal spray Place 2 sprays into both nostrils daily. 16 g 1  . glucose blood (ACCU-CHEK AVIVA PLUS) test strip Use as directed  twice a day.  Dx Code: E11.9 100 each 1  . imiquimod (ALDARA) 5 % cream Apply topically 3 (three) times a week. Apply until total clearance or maximum of 16 weeks 24 each 5  . insulin aspart protamine - aspart (NOVOLOG 70/30 MIX) (70-30) 100 UNIT/ML FlexPen Inject 0.05 mLs (5 Units total) into the skin daily with breakfast. 10 u sq q am and 5u sq qpm 15 mL 11  . Insulin Pen Needle (B-D UF III MINI PEN NEEDLES) 31G X 5 MM MISC To use with insulin 100 each 2  . linagliptin (TRADJENTA) 5 MG TABS tablet Take 1 tablet (5 mg total) by mouth daily. 30 tablet 11  . lisinopril (PRINIVIL,ZESTRIL) 40 MG tablet Take 1 tablet (40 mg total) by mouth daily. 90 tablet 3  . pioglitazone (ACTOS) 30 MG tablet Take 1 tablet (30 mg total) by mouth daily. 30 tablet 2  . polyethylene glycol (MIRALAX / GLYCOLAX) packet Take 17 g by mouth daily. 14 each 0  . prednisoLONE acetate (PRED FORTE) 1 % ophthalmic suspension Place 1 drop into both  eyes 2 (two) times daily.     . Pyridoxine HCl (VITAMIN B-6 PO) Take 1 tablet by mouth daily with breakfast.    . Vitamin D, Ergocalciferol, (DRISDOL) 50000 units CAPS capsule Take 1 capsule (50,000 Units total) by mouth every 7 (seven) days. 12 capsule 0   No facility-administered medications prior to visit.     Allergies  Allergen Reactions  . Metformin And Related Diarrhea    Review of Systems  Constitutional: Negative for fever and malaise/fatigue.  HENT: Negative for congestion.   Eyes: Negative for blurred vision.  Respiratory: Negative for cough and shortness of breath.   Cardiovascular: Negative for chest pain, palpitations and leg swelling.  Gastrointestinal: Negative for vomiting.  Genitourinary: Negative for dysuria, frequency and urgency.       Incontinence   Musculoskeletal: Negative for back pain.  Skin: Negative for rash.  Neurological: Negative for loss of consciousness and headaches.       Objective:    Physical Exam  Constitutional: She is oriented to person, place, and time. She appears well-developed and well-nourished.  HENT:  Head: Normocephalic and atraumatic.  Eyes: Conjunctivae and EOM are normal.  Neck: Normal range of motion. Neck supple. No JVD present. Carotid bruit is not present. No thyromegaly present.  Cardiovascular: Normal rate, regular rhythm and normal heart sounds.  No murmur heard. Pulmonary/Chest: Effort normal and breath sounds normal. No respiratory distress. She has no wheezes. She has no rales. She exhibits no tenderness.  Abdominal: Soft. There is no tenderness. There is no rebound and no guarding.  Musculoskeletal: She exhibits no edema.  Neurological: She is alert and oriented to person, place, and time.  Psychiatric: She has a normal mood and affect.  Nursing note and vitals reviewed.   BP 110/76 (BP Location: Left Arm, Cuff Size: Normal)   Pulse 66   Temp 98.1 F (36.7 C) (Oral)   Resp 16   Ht '5\' 10"'  (1.778 m)   Wt  197 lb (89.4 kg)   LMP  (LMP Unknown) Comment: IUD  SpO2 98%   BMI 28.27 kg/m  Wt Readings from Last 3 Encounters:  03/04/17 197 lb (89.4 kg)  02/20/17 199 lb 6.4 oz (90.4 kg)  02/13/17 201 lb (91.2 kg)   BP Readings from Last 3 Encounters:  03/04/17 110/76  02/20/17 132/70  02/13/17 (!) 144/78      There is no  immunization history on file for this patient.  Health Maintenance  Topic Date Due  . PNEUMOCOCCAL POLYSACCHARIDE VACCINE (1) 04/05/2017 (Originally 04/29/1970)  . TETANUS/TDAP  04/05/2017 (Originally 04/29/1987)  . INFLUENZA VACCINE  04/27/2017 (Originally 08/28/2016)  . OPHTHALMOLOGY EXAM  03/28/2017  . FOOT EXAM  07/08/2017  . HEMOGLOBIN A1C  07/31/2017  . PAP SMEAR  05/21/2019  . HIV Screening  Completed    Lab Results  Component Value Date   WBC 7.0 09/06/2016   HGB 12.2 09/06/2016   HCT 37.4 09/06/2016   PLT 220.0 09/06/2016   GLUCOSE 156 (H) 09/06/2016   CHOL 199 07/08/2016   TRIG 86.0 07/08/2016   HDL 58.30 07/08/2016   LDLCALC 123 (H) 07/08/2016   ALT 20 08/30/2016   AST 19 08/30/2016   NA 138 09/06/2016   K 3.9 09/06/2016   CL 102 09/06/2016   CREATININE 1.21 (H) 09/06/2016   BUN 20 09/06/2016   CO2 30 09/06/2016   TSH 0.969 08/30/2016   INR 1.26 10/28/2015   HGBA1C 7.3 01/31/2017    Lab Results  Component Value Date   TSH 0.969 08/30/2016   Lab Results  Component Value Date   WBC 7.0 09/06/2016   HGB 12.2 09/06/2016   HCT 37.4 09/06/2016   MCV 85.5 09/06/2016   PLT 220.0 09/06/2016   Lab Results  Component Value Date   NA 138 09/06/2016   K 3.9 09/06/2016   CHLORIDE 105 04/01/2016   CO2 30 09/06/2016   GLUCOSE 156 (H) 09/06/2016   BUN 20 09/06/2016   CREATININE 1.21 (H) 09/06/2016   BILITOT 0.4 08/30/2016   ALKPHOS 67 08/30/2016   AST 19 08/30/2016   ALT 20 08/30/2016   PROT 6.3 (L) 08/30/2016   ALBUMIN 3.4 (L) 08/30/2016   CALCIUM 9.7 09/06/2016   ANIONGAP 10 08/30/2016   EGFR 58 (L) 04/01/2016   GFR 60.98 09/06/2016    Lab Results  Component Value Date   CHOL 199 07/08/2016   Lab Results  Component Value Date   HDL 58.30 07/08/2016   Lab Results  Component Value Date   LDLCALC 123 (H) 07/08/2016   Lab Results  Component Value Date   TRIG 86.0 07/08/2016   Lab Results  Component Value Date   CHOLHDL 3 07/08/2016   Lab Results  Component Value Date   HGBA1C 7.3 01/31/2017         Assessment & Plan:   Problem List Items Addressed This Visit    None    Visit Diagnoses    Urge incontinence of urine    -  Primary   Relevant Medications   solifenacin (VESICARE) 10 MG tablet   Other Relevant Orders   POCT Urinalysis Dipstick (Automated)    ua done-- normal If med does not give her relief we will refer to urology I am having Mckynna N. Youkhana start on solifenacin. I am also having her maintain her aspirin, prednisoLONE acetate, amLODipine, Vitamin D (Ergocalciferol), imiquimod, Pyridoxine HCl (VITAMIN B-6 PO), Cyanocobalamin (VITAMIN B-12 PO), polyethylene glycol, docusate sodium, fluticasone, Insulin Pen Needle, lisinopril, escitalopram, insulin aspart protamine - aspart, ACCU-CHEK AVIVA PLUS, glucose blood, ACCU-CHEK SOFTCLIX LANCETS, linagliptin, and pioglitazone.  Meds ordered this encounter  Medications  . solifenacin (VESICARE) 10 MG tablet    Sig: Take 1 tablet (10 mg total) by mouth daily.    Dispense:  30 tablet    Refill:  2    CMA served as scribe during this visit. History, Physical and Plan performed  by medical provider. Documentation and orders reviewed and attested to.  Ann Held, DO

## 2017-03-10 NOTE — Progress Notes (Signed)
Tawana ScaleZach Smith D.O. [facility] [address] [place], [place] [address] Phone: [phone] Subjective:     CC: Right shoulder pain follow-up  ZHY:[id number]:Subjective  Manfred ShirtsMarva N Lloyd is a 47 y.o. female coming in with complaint of right shoulder pain.  Has had difficulty with this previously.  Doing much better after the last injection.  Patient did have right shoulder x-rays at last exam that were negative for any bony abnormalities that were independently visualized by me.  Patient states that she is feeling 90-95% better.  Happy with the results so far.  Feels like she has made increasing progress with strength as well as range of motion.      Past Medical History:  Diagnosis Date  . Diabetes ([facility])   . Diabetes mellitus without complication ([facility])   . Diastolic dysfunction   . Hypertension   . Immune deficiency disorder ([facility])   . Menometrorrhagia 04/01/2016  . Stroke [facility])    Past Surgical History:  Procedure Laterality Date  . NO PAST SURGERIES     Social History   Socioeconomic History  . Marital status: Married    Spouse name: Not on file  . Number of children: Not on file  . Years of education: Not on file  . Highest education level: Not on file  Social Needs  . Financial resource strain: Not on file  . Food insecurity - worry: Not on file  . Food insecurity - inability: Not on file  . Transportation needs - medical: Not on file  . Transportation needs - non-medical: Not on file  Occupational History  . Not on file  Tobacco Use  . Smoking status: Never Smoker  . Smokeless tobacco: Never Used  Substance and Sexual Activity  . Alcohol use: No    Alcohol/week: 0.0 oz    Comment: occ  . Drug use: No  . Sexual activity: Yes    Birth control/protection: None  Other Topics Concern  . Not on file  Social History Narrative   ** Merged History Encounter **       Allergies  Allergen Reactions  . Metformin And Related Diarrhea   Family History  Problem  Relation Age of Onset  . Diabetes Father   . Hypertension Father   . Stroke Father   . Diabetes Paternal Uncle   . Stroke Paternal Uncle   . Stroke Brother   . Cancer Neg Hx   . Breast cancer Neg Hx   . Ovarian cancer Neg Hx      Past medical history, social, surgical and family history all reviewed in electronic medical record.  No pertanent information unless stated regarding to the chief complaint.   Review of Systems:Review of systems updated and as accurate as of 03/11/17  No headache, visual changes, nausea, vomiting, diarrhea, constipation, dizziness, abdominal pain, skin rash, fevers, chills, night sweats, weight loss, swollen lymph nodes, body aches, joint swelling, muscle aches, chest pain, shortness of breath, mood changes.  Positive muscle aches  Objective  Blood pressure 126/80, pulse 63, height 5\' 10"  (1.778 m), weight 201 lb (91.2 kg). Systems examined below as of 03/11/17   General: No apparent distress alert and oriented x3 mood and affect normal, dressed appropriately.  HEENT: Pupils equal, extraocular movements intact  Respiratory: Patient's speak in full sentences and does not appear short of breath  Cardiovascular: No lower extremity edema, non tender, no erythema  Skin: Warm dry intact with no signs of infection or rash on extremities or  on axial skeleton.  Abdomen: Soft nontender  Neuro: Cranial nerves II through XII are intact, neurovascularly intact in all extremities with 2+ DTRs and 2+ pulses.  Lymph: No lymphadenopathy of posterior or anterior cervical chain or axillae bilaterally.   Gaitmild antalgic MSK:MSK: Non tender with full range of motion and good stability and symmetric strength and tone of elbows, wrist, hip, knee and ankles bilaterally. Weakness of the left upper extremity. 3 out of 5 strength. Right shoulder exam shows the patient does have some improving range of motion. Has about 40 degrees of external range of motion, does have  internal range of motion to sacrum. Forward flexion to 165 degrees. Rotator cuff strength 4+ out of 5 still noted.  This is worse than previous examNontender on exam with palpation. Contralateral shoulder has 3 out of 5 strength secondary to patient's previous stroke    Impression and Recommendations:     This case required medical decision making of moderate complexity.      Note: This dictation was prepared with Dragon dictation along with smaller phrase technology. Any transcriptional errors that result from this process are unintentional.        ns

## 2017-03-11 ENCOUNTER — Ambulatory Visit: Payer: Medicaid Other | Admitting: Family Medicine

## 2017-03-11 DIAGNOSIS — E11618 Type 2 diabetes mellitus with other diabetic arthropathy: Secondary | ICD-10-CM

## 2017-03-11 DIAGNOSIS — M7500 Adhesive capsulitis of unspecified shoulder: Secondary | ICD-10-CM

## 2017-03-11 NOTE — Assessment & Plan Note (Signed)
Improved.  Patient is doing well after the last injection.  Follow-up as needed

## 2017-03-25 ENCOUNTER — Telehealth: Payer: Self-pay | Admitting: Family Medicine

## 2017-03-25 NOTE — Telephone Encounter (Signed)
Copied from CRM #[id number]. Topic: Quick [id number] - See Telephone Encounter >> Mar 25, 2017  1:12 PM Valentina LucksMatos, Jackelin wrote: CRM for notification. See Telephone encounter for:  03/25/17.   Pt dropped off document to be filled out by provider (Claim Form to Continue Disability Insurance Benefits- 1 page) Pt would like to have document faxed to [phone]. Document put at front office tray under providers name.

## 2017-03-28 NOTE — Telephone Encounter (Signed)
Completed as much as possible; forwarded to provider/SLS 03/01

## 2017-03-31 ENCOUNTER — Encounter: Payer: Self-pay | Admitting: Endocrinology

## 2017-03-31 ENCOUNTER — Ambulatory Visit (INDEPENDENT_AMBULATORY_CARE_PROVIDER_SITE_OTHER): Payer: Medicaid Other | Admitting: Endocrinology

## 2017-03-31 VITALS — BP 124/78 | HR 67 | Temp 97.5°F | Wt 205.0 lb

## 2017-03-31 DIAGNOSIS — E1159 Type 2 diabetes mellitus with other circulatory complications: Secondary | ICD-10-CM

## 2017-03-31 LAB — POCT GLYCOSYLATED HEMOGLOBIN (HGB A1C): Hemoglobin A1C: 7

## 2017-03-31 MED ORDER — LINAGLIPTIN 5 MG PO TABS: 5.0000 mg | ORAL_TABLET | Freq: Every day | ORAL | 11 refills | Status: DC

## 2017-03-31 NOTE — Patient Instructions (Addendum)
check your blood sugar twice a day.  vary the time of day when you check, between before the 3 meals, and at bedtime.  also check if you have symptoms of your blood sugar being too high or too low.  please keep a record of the readings and bring it to your next appointment here (or you can bring the meter itself).  You can write it on any piece of paper.  please call us sooner if your blood sugar goes below 70, or if you have a lot of readings over 200. Please continue the same "pioglitizone," and: Change the insulin to tradjenta  If necessary, we can add "farxiga."  Please come back for a follow-up appointment in 2 months.

## 2017-03-31 NOTE — Progress Notes (Signed)
Subjective:    Patient ID: Stephanie Lloyd, female    DOB: 1970/05/14, 47 y.o.   MRN: [id number]  HPI Pt returns for f/u of diabetes mellitus: DM type: 2 Dx'ed: 4158 Complications: polyneuropathy, CVA, and renal insuf Therapy: insulin since soon after dx GDM: never DKA: never Severe hypoglycemia: last episode was mid-2018 Pancreatitis: never Pancreatic imaging: normal on 2018 CT.  Other: plan is to d/c insulin if possible; memory loss limits complexity of regimen. Interval history: Pt says she did not take tradjenta, because medicaid declined.  no cbg record, but states cbg's vary from 60-200's.   She takes pioglitizone and 70/30 insulin: 10 units qam and 6 units qpm.  Past Medical History:  Diagnosis Date  . Diabetes ([place])   . Diabetes mellitus without complication ([place])   . Diastolic dysfunction   . Hypertension   . Immune deficiency disorder ([place])   . Menometrorrhagia 04/01/2016  . Stroke [facility])     Past Surgical History:  Procedure Laterality Date  . NO PAST SURGERIES      Social History   Socioeconomic History  . Marital status: Married    Spouse name: Not on file  . Number of children: Not on file  . Years of education: Not on file  . Highest education level: Not on file  Social Needs  . Financial resource strain: Not on file  . Food insecurity - worry: Not on file  . Food insecurity - inability: Not on file  . Transportation needs - medical: Not on file  . Transportation needs - non-medical: Not on file  Occupational History  . Not on file  Tobacco Use  . Smoking status: Never Smoker  . Smokeless tobacco: Never Used  Substance and Sexual Activity  . Alcohol use: No    Alcohol/week: 0.0 oz    Comment: occ  . Drug use: No  . Sexual activity: Yes    Birth control/protection: None  Other Topics Concern  . Not on file  Social History Narrative   ** Merged History Encounter **        Current Outpatient Medications on File Prior to Visit  Medication  Sig Dispense Refill  . ACCU-CHEK SOFTCLIX LANCETS lancets Use as directed twice a day.  Dx Code: E11.9 100 each 1  . amLODipine (NORVASC) 10 MG tablet Take 1 tablet (10 mg total) by mouth daily. 90 tablet 3  . aspirin EC 325 MG EC tablet Take 1 tablet (325 mg total) by mouth daily. 30 tablet 11  . Blood Glucose Monitoring Suppl (ACCU-CHEK AVIVA PLUS) w/Device KIT Use as directed twice a day.  Dx Code: E11.9 1 kit 0  . Cyanocobalamin (VITAMIN B-12 PO) Take 1 tablet by mouth daily with breakfast.    . docusate sodium (COLACE) 100 MG capsule Take 1 capsule (100 mg total) by mouth 2 (two) times daily. 30 capsule 1  . escitalopram (LEXAPRO) 10 MG tablet Take 1 tablet (10 mg total) by mouth daily. 30 tablet 2  . fluticasone (FLONASE) 50 MCG/ACT nasal spray Place 2 sprays into both nostrils daily. 16 g 1  . glucose blood (ACCU-CHEK AVIVA PLUS) test strip Use as directed twice a day.  Dx Code: E11.9 100 each 1  . imiquimod (ALDARA) 5 % cream Apply topically 3 (three) times a week. Apply until total clearance or maximum of 16 weeks 24 each 5  . Insulin Pen Needle (B-D UF III MINI PEN NEEDLES) 31G X 5 MM MISC To use with insulin  100 each 2  . lisinopril (PRINIVIL,ZESTRIL) 40 MG tablet Take 1 tablet (40 mg total) by mouth daily. 90 tablet 3  . pioglitazone (ACTOS) 30 MG tablet Take 1 tablet (30 mg total) by mouth daily. 30 tablet 2  . polyethylene glycol (MIRALAX / GLYCOLAX) packet Take 17 g by mouth daily. 14 each 0  . prednisoLONE acetate (PRED FORTE) 1 % ophthalmic suspension Place 1 drop into both eyes 2 (two) times daily.     . Pyridoxine HCl (VITAMIN B-6 PO) Take 1 tablet by mouth daily with breakfast.    . solifenacin (VESICARE) 10 MG tablet Take 1 tablet (10 mg total) by mouth daily. 30 tablet 2  . Vitamin D, Ergocalciferol, (DRISDOL) 50000 units CAPS capsule Take 1 capsule (50,000 Units total) by mouth every 7 (seven) days. 12 capsule 0   No current facility-administered medications on file prior  to visit.     Allergies  Allergen Reactions  . Metformin And Related Diarrhea    Family History  Problem Relation Age of Onset  . Diabetes Father   . Hypertension Father   . Stroke Father   . Diabetes Paternal Uncle   . Stroke Paternal Uncle   . Stroke Brother   . Cancer Neg Hx   . Breast cancer Neg Hx   . Ovarian cancer Neg Hx     BP 124/78 (BP Location: Left Arm, Patient Position: Sitting, Cuff Size: Normal)   Pulse 67   Temp (!) 97.5 F (36.4 C) (Oral)   Wt 205 lb (93 kg)   SpO2 99%   BMI 29.41 kg/m   Review of Systems She denies hypoglycemia.      Objective:   Physical Exam VITAL SIGNS:  See vs page GENERAL: no distress Pulses: dorsalis pedis intact bilat.   MSK: no deformity of the feet CV: no leg edema Skin:  no ulcer on the feet.  normal color and temp on the feet. Neuro: sensation is intact to touch on the feet  Lab Results  Component Value Date   CREATININE 1.21 (H) 09/06/2016   BUN 20 09/06/2016   NA 138 09/06/2016   K 3.9 09/06/2016   CL 102 09/06/2016   CO2 30 09/06/2016       Assessment & Plan:  Type 2 DM: she is ready to transition off insulin Renal insuff: this limits rx options.   Patient Instructions  check your blood sugar twice a day.  vary the time of day when you check, between before the 3 meals, and at bedtime.  also check if you have symptoms of your blood sugar being too high or too low.  please keep a record of the readings and bring it to your next appointment here (or you can bring the meter itself).  You can write it on any piece of paper.  please call us sooner if your blood sugar goes below 70, or if you have a lot of readings over 200. Please continue the same "pioglitizone," and: Change the insulin to tradjenta  If necessary, we can add "farxiga."  Please come back for a follow-up appointment in 2 months.

## 2017-04-17 ENCOUNTER — Ambulatory Visit: Payer: Self-pay | Admitting: *Deleted

## 2017-04-17 ENCOUNTER — Telehealth: Payer: Self-pay | Admitting: Family Medicine

## 2017-04-17 ENCOUNTER — Emergency Department (HOSPITAL_COMMUNITY): Payer: Medicaid Other

## 2017-04-17 ENCOUNTER — Other Ambulatory Visit: Payer: Self-pay

## 2017-04-17 ENCOUNTER — Inpatient Hospital Stay (HOSPITAL_COMMUNITY)
Admission: EM | Admit: 2017-04-17 | Discharge: 2017-04-20 | DRG: 065 | Disposition: A | Discharge status: Home / Self Care | Payer: Medicaid Other | Attending: Family Medicine | Admitting: Family Medicine

## 2017-04-17 ENCOUNTER — Encounter (HOSPITAL_COMMUNITY): Payer: Self-pay | Admitting: *Deleted

## 2017-04-17 DIAGNOSIS — R269 Unspecified abnormalities of gait and mobility: Secondary | ICD-10-CM | POA: Diagnosis present

## 2017-04-17 DIAGNOSIS — R471 Dysarthria and anarthria: Secondary | ICD-10-CM | POA: Diagnosis present

## 2017-04-17 DIAGNOSIS — E1142 Type 2 diabetes mellitus with diabetic polyneuropathy: Secondary | ICD-10-CM | POA: Diagnosis present

## 2017-04-17 DIAGNOSIS — G4733 Obstructive sleep apnea (adult) (pediatric): Secondary | ICD-10-CM | POA: Diagnosis present

## 2017-04-17 DIAGNOSIS — R29898 Other symptoms and signs involving the musculoskeletal system: Secondary | ICD-10-CM | POA: Diagnosis present

## 2017-04-17 DIAGNOSIS — E1159 Type 2 diabetes mellitus with other circulatory complications: Secondary | ICD-10-CM | POA: Diagnosis present

## 2017-04-17 DIAGNOSIS — I639 Cerebral infarction, unspecified: Secondary | ICD-10-CM | POA: Diagnosis present

## 2017-04-17 DIAGNOSIS — Z794 Long term (current) use of insulin: Secondary | ICD-10-CM

## 2017-04-17 DIAGNOSIS — Z79899 Other long term (current) drug therapy: Secondary | ICD-10-CM

## 2017-04-17 DIAGNOSIS — N183 Chronic kidney disease, stage 3 unspecified: Secondary | ICD-10-CM

## 2017-04-17 DIAGNOSIS — R29703 NIHSS score 3: Secondary | ICD-10-CM | POA: Diagnosis present

## 2017-04-17 DIAGNOSIS — D849 Immunodeficiency, unspecified: Secondary | ICD-10-CM | POA: Diagnosis present

## 2017-04-17 DIAGNOSIS — I63522 Cerebral infarction due to unspecified occlusion or stenosis of left anterior cerebral artery: Principal | ICD-10-CM | POA: Diagnosis present

## 2017-04-17 DIAGNOSIS — I6930 Unspecified sequelae of cerebral infarction: Secondary | ICD-10-CM

## 2017-04-17 DIAGNOSIS — R2990 Unspecified symptoms and signs involving the nervous system: Secondary | ICD-10-CM

## 2017-04-17 DIAGNOSIS — E1122 Type 2 diabetes mellitus with diabetic chronic kidney disease: Secondary | ICD-10-CM | POA: Diagnosis present

## 2017-04-17 DIAGNOSIS — I10 Essential (primary) hypertension: Secondary | ICD-10-CM | POA: Diagnosis present

## 2017-04-17 DIAGNOSIS — R29810 Facial weakness: Secondary | ICD-10-CM | POA: Diagnosis present

## 2017-04-17 DIAGNOSIS — R402413 Glasgow coma scale score 13-15, at hospital admission: Secondary | ICD-10-CM | POA: Diagnosis present

## 2017-04-17 DIAGNOSIS — R1310 Dysphagia, unspecified: Secondary | ICD-10-CM | POA: Diagnosis present

## 2017-04-17 DIAGNOSIS — E1151 Type 2 diabetes mellitus with diabetic peripheral angiopathy without gangrene: Secondary | ICD-10-CM | POA: Diagnosis present

## 2017-04-17 DIAGNOSIS — Z7982 Long term (current) use of aspirin: Secondary | ICD-10-CM

## 2017-04-17 DIAGNOSIS — R4781 Slurred speech: Secondary | ICD-10-CM | POA: Diagnosis present

## 2017-04-17 DIAGNOSIS — Z8679 Personal history of other diseases of the circulatory system: Secondary | ICD-10-CM

## 2017-04-17 DIAGNOSIS — Z888 Allergy status to other drugs, medicaments and biological substances status: Secondary | ICD-10-CM

## 2017-04-17 DIAGNOSIS — I129 Hypertensive chronic kidney disease with stage 1 through stage 4 chronic kidney disease, or unspecified chronic kidney disease: Secondary | ICD-10-CM | POA: Diagnosis present

## 2017-04-17 DIAGNOSIS — R296 Repeated falls: Secondary | ICD-10-CM | POA: Diagnosis present

## 2017-04-17 LAB — DIFFERENTIAL
BASOS ABS: 0 10*3/uL (ref 0.0–0.1)
Basophils Relative: 0 %
Eosinophils Absolute: 0.2 10*3/uL (ref 0.0–0.7)
Eosinophils Relative: 2 %
LYMPHS ABS: 1.7 10*3/uL (ref 0.7–4.0)
LYMPHS PCT: 22 %
Monocytes Absolute: 0.6 10*3/uL (ref 0.1–1.0)
Monocytes Relative: 8 %
NEUTROS ABS: 5.4 10*3/uL (ref 1.7–7.7)
NEUTROS PCT: 68 %

## 2017-04-17 LAB — PROTIME-INR
INR: 1.11
PROTHROMBIN TIME: 14.2 s (ref 11.4–15.2)

## 2017-04-17 LAB — I-STAT CHEM 8, ED
BUN: 26 mg/dL — AB (ref 6–20)
CALCIUM ION: 1.21 mmol/L (ref 1.15–1.40)
CHLORIDE: 103 mmol/L (ref 101–111)
CREATININE: 1.5 mg/dL — AB (ref 0.44–1.00)
Glucose, Bld: 123 mg/dL — ABNORMAL HIGH (ref 65–99)
HEMATOCRIT: 39 % (ref 36.0–46.0)
Hemoglobin: 13.3 g/dL (ref 12.0–15.0)
Potassium: 4.4 mmol/L (ref 3.5–5.1)
SODIUM: 141 mmol/L (ref 135–145)
TCO2: 28 mmol/L (ref 22–32)

## 2017-04-17 LAB — CBC
HCT: 39 % (ref 36.0–46.0)
HEMOGLOBIN: 12.4 g/dL (ref 12.0–15.0)
MCH: 28.1 pg (ref 26.0–34.0)
MCHC: 31.8 g/dL (ref 30.0–36.0)
MCV: 88.2 fL (ref 78.0–100.0)
PLATELETS: 194 10*3/uL (ref 150–400)
RBC: 4.42 MIL/uL (ref 3.87–5.11)
RDW: 14.1 % (ref 11.5–15.5)
WBC: 7.8 10*3/uL (ref 4.0–10.5)

## 2017-04-17 LAB — COMPREHENSIVE METABOLIC PANEL
ALBUMIN: 3.8 g/dL (ref 3.5–5.0)
ALT: 14 U/L (ref 14–54)
ANION GAP: 7 (ref 5–15)
AST: 19 U/L (ref 15–41)
Alkaline Phosphatase: 72 U/L (ref 38–126)
BUN: 23 mg/dL — AB (ref 6–20)
CALCIUM: 9.1 mg/dL (ref 8.9–10.3)
CO2: 27 mmol/L (ref 22–32)
Chloride: 105 mmol/L (ref 101–111)
Creatinine, Ser: 1.39 mg/dL — ABNORMAL HIGH (ref 0.44–1.00)
GFR calc Af Amer: 51 mL/min — ABNORMAL LOW (ref >60)
GFR calc non Af Amer: 44 mL/min — ABNORMAL LOW (ref >60)
GLUCOSE: 127 mg/dL — AB (ref 65–99)
Potassium: 4.4 mmol/L (ref 3.5–5.1)
SODIUM: 139 mmol/L (ref 135–145)
Total Bilirubin: 0.4 mg/dL (ref 0.3–1.2)
Total Protein: 7 g/dL (ref 6.5–8.1)

## 2017-04-17 LAB — APTT: APTT: 30 s (ref 24–36)

## 2017-04-17 LAB — I-STAT TROPONIN, ED: Troponin i, poc: 0 ng/mL (ref 0.00–0.08)

## 2017-04-17 LAB — I-STAT BETA HCG BLOOD, ED (MC, WL, AP ONLY): I-stat hCG, quantitative: <5 m[IU]/mL (ref <5)

## 2017-04-17 NOTE — Telephone Encounter (Signed)
FYI to PCP

## 2017-04-17 NOTE — Telephone Encounter (Unsigned)
Copied from CRM [id number]. Topic: Referral - Request >> Apr 17, 2017 10:27 AM Raquel SarnaHayes, Teresa G wrote: Pt is needing to get another referral for a visit with Dr. Loel DubonnetSzu.  She thinks she has had some mini strokes.

## 2017-04-17 NOTE — Telephone Encounter (Signed)
I agree

## 2017-04-17 NOTE — Telephone Encounter (Signed)
She should not need a referral --- she saw her last May

## 2017-04-17 NOTE — ED Provider Notes (Signed)
Emergency Department Provider Note   I have reviewed the triage vital signs and the nursing notes.   HISTORY  Chief Complaint Facial Droop   HPI Stephanie Lloyd is a 47 y.o. female with PMH of prior CVA x 4, DM, HTN, and dCHF presents to the emergency department for evaluation of worsening slurred speech, right face droop, balance difficulty, and worsening difficulty swallowing.  Symptoms began 3 days ago.  The patient has baseline generalized weakness from prior strokes but is able to perform ADLs with minimal assistance.  She has had episodes of falling and worsening difficulty swallowing.  She denies any fevers, choking, or vomiting.  She does have some baseline difficulty swallowing but states it is much worse than normal.  The patient's husband states that she had some right face droop which has improved only slightly.  She denies feeling unilaterally weak but instead describes being weak all over. No radiation of symptoms or modifying factors.   Past Medical History:  Diagnosis Date  . Diabetes (HCC)   . Diabetes mellitus without complication (HCC)   . Diastolic dysfunction   . Hypertension   . Immune deficiency disorder (HCC)   . Menometrorrhagia 04/01/2016  . Stroke [facility])     Patient Active Problem List   Diagnosis Date Noted  . Anxiety 01/16/2017  . Leg cramps 01/16/2017  . Hypoglycemia due to insulin 08/29/2016  . Diverticulosis 08/29/2016  . 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 Diverticulitis 08/29/2016  . 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 OSA (obstructive sleep apnea) 06/10/2016  . Iron deficiency anemia due to chronic blood loss 04/01/2016  . Menometrorrhagia 04/01/2016  . Upper respiratory infection 03/25/2016  . History of stroke   . History of intracerebral hemorrhage without residual deficit   . Cerebrovascular accident (CVA) due to thrombosis of basilar artery (HCC) 02/28/2016  .  Ischemic stroke (HCC) 02/28/2016  . Acute kidney injury (HCC) 10/28/2015  . Acute encephalopathy 10/28/2015  . Diastolic dysfunction   . Stroke (cerebrum) (HCC)   . Focal infarction of brain (HCC) 10/08/2015  . Syncope 08/19/2015  . Hypertensive emergency 07/29/2015  . Hypertensive urgency 07/28/2015  . Hypokalemia 07/28/2015  . Accelerated hypertension 09/21/2014  . Type 2 diabetes mellitus with other circulatory complications (HCC) 09/21/2014  . Hyperlipidemia 09/21/2014  . 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 Hyperlipidemia LDL goal <70 07/29/2014  . Hemorrhagic stroke (HCC) 06/22/2014  . ICH (intracerebral hemorrhage) (HCC) 06/22/2014  . Essential hypertension 05/13/2012    Past Surgical History:  Procedure Laterality Date  . NO PAST SURGERIES      Current Outpatient Rx  . Order #: [id number] Class: Normal  . Order #: [id number] Class: Normal  . Order #: [id number] Class: Normal  . Order #: [id number] Class: Normal  . Order #: [id number] Class: Normal  . Order #: [id number] Class: Normal  . Order #: [id number] Class: Historical Med  . Order #: [id number] Class: Normal  . Order #: [id number] Class: Normal  . Order #: [id number] Class: Normal  . Order #: [id number] Class: Print  . Order #: [id number] Class: Normal  . Order #: [id number] Class: Normal  . Order #: [id number] Class: Normal  . Order #: [id number] Class: Normal  . Order #: [id number] Class: Print  . Order #: [id number] Class: Normal    Allergies Metformin and related  Family History  Problem Relation Age of Onset  . Diabetes Father   . Hypertension Father   . Stroke Father   . Diabetes Paternal Uncle   .  Stroke Paternal Uncle   . Stroke Brother   . Cancer Neg Hx   . Breast cancer Neg Hx   . Ovarian cancer Neg Hx     Social History Social History   Tobacco Use  . Smoking status: Never Smoker  . Smokeless tobacco: Never Used  Substance Use Topics  . Alcohol use: No    Alcohol/week: 0.0 oz    Comment: occ   . Drug use: No    Review of Systems  Constitutional: No fever/chills Eyes: No visual changes. ENT: No sore throat. Acute on chronic difficulty swallowing.  Cardiovascular: Denies chest pain. Respiratory: Denies shortness of breath. Gastrointestinal: No abdominal pain. No nausea, no vomiting.  No diarrhea.  No constipation. Genitourinary: Negative for dysuria. Musculoskeletal: Negative for back pain. Skin: Negative for rash. Neurological: Negative for headaches. Positive generalized weakness and gait instability.   10-point ROS otherwise negative.  ____________________________________________   PHYSICAL EXAM:  VITAL SIGNS: ED Triage Vitals  Enc Vitals Group     BP 04/17/17 1729 138/87     Pulse Rate 04/17/17 1729 69     Resp 04/17/17 1729 16     Temp 04/17/17 1729 (!) 97.5 F (36.4 C)     Temp Source 04/17/17 1729 Oral     SpO2 04/17/17 1729 99 %     Pain Score 04/17/17 1525 0   Constitutional: Alert and oriented. No acute distress.  Eyes: Conjunctivae are normal. PERRL. EOMI. Head: Atraumatic. Nose: No congestion/rhinnorhea. Mouth/Throat: Mucous membranes are moist.  Oropharynx non-erythematous. Neck: No stridor.   Cardiovascular: Normal rate, regular rhythm. Good peripheral circulation. Grossly normal heart sounds.   Respiratory: Normal respiratory effort.  No retractions. Lungs CTAB. Gastrointestinal: Soft and nontender. No distention.  Musculoskeletal: No lower extremity tenderness nor edema. No gross deformities of extremities. Neurologic: Speech is slightly slow and hesitant at times. Normal CN exam 2-12. Normal biceps/triceps/grip strength bilaterally. No pronator drift. Normal finger-to-nose testing. Normal strength and sensation in the B/L LEs. Skin:  Skin is warm, dry and intact. No rash noted.  ____________________________________________   LABS (all labs ordered are listed, but only abnormal results are displayed)  Labs Reviewed  COMPREHENSIVE  METABOLIC PANEL - Abnormal; Notable for the following components:      Result Value   Glucose, Bld 127 (*)    BUN 23 (*)    Creatinine, Ser 1.39 (*)    GFR calc non Af Amer 44 (*)    GFR calc Af Amer 51 (*)    All other components within normal limits  I-STAT CHEM 8, ED - Abnormal; Notable for the following components:   BUN 26 (*)    Creatinine, Ser 1.50 (*)    Glucose, Bld 123 (*)    All other components within normal limits  PROTIME-INR  APTT  CBC  DIFFERENTIAL  I-STAT TROPONIN, ED  CBG MONITORING, ED  I-STAT BETA HCG BLOOD, ED ([facility], [facility], [facility] ONLY)   ____________________________________________  EKG   EKG Interpretation  Date/Time:  Thursday April 17 2017 15:20:34 EDT Ventricular Rate:  69 PR Interval:  146 QRS Duration: 86 QT Interval:  384 QTC Calculation: 411 R Axis:   31 Text Interpretation:  Normal sinus rhythm Normal ECG No STEMI.  Confirmed by Alona Bene [phone]) on 04/17/2017 8:50:57 PM       ____________________________________________  RADIOLOGY  Ct Head Wo Contrast  Result Date: 04/17/2017 CLINICAL DATA:  Generalized weakness with unsteady gait. History of CVA. EXAM: CT HEAD WITHOUT CONTRAST TECHNIQUE: Contiguous  axial images were obtained from the base of the skull through the vertex without intravenous contrast. COMPARISON:  MRI 02/28/2016, CT 02/28/2016 FINDINGS: Brain: Redemonstration of moderate periventricular white matter hypo density compatible with chronic small vessel ischemia. Chronic small basal ganglial lacunar infarcts are noted on the right as well as within the right thalamus. No acute intracranial hemorrhage, large vascular territory infarct, midline shift or edema. No intra-axial mass nor extra-axial collections. Mild superficial and moderate central atrophy is noted sulcal and ventricular prominence. Midline fourth ventricle basal cisterns without effacement. Vascular: No hyperdense vessel sign or unexpected calcification. Skull: Negative for  acute fracture. Negative for suspicious osseous lesions. Sinuses/Orbits: Clear bilateral mastoids. Intact orbits and globes. No acute sinus disease. Probable small mucous retention cyst in the anterior left maxillary sinus. Other: None IMPRESSION: Chronic moderate small vessel ischemic disease of periventricular white matter. No acute intracranial abnormality. Electronically Signed   By: Tollie Ethavid  Kwon M.D.   On: 04/17/2017 18:09    ____________________________________________   PROCEDURES  Procedure(s) performed:   Procedures  None ____________________________________________   INITIAL IMPRESSION / ASSESSMENT AND PLAN / ED COURSE  Pertinent labs & imaging results that were available during my care of the patient were reviewed by me and considered in my medical decision making (see chart for details).  Patient presents to the emergency department for evaluation of worsening generalized weakness with difficulty swallowing, speech change, concern for face droop.  Patient with multiple prior strokes.  She has some baseline difficulty swallowing but feels it is gotten significantly worse since her other symptoms began.  Given her history I do have concern for acute infarct.  Patient is outside of window for stroke intervention. Will consult Neurology and send for MRI but given history and continued deficits the patient may benefit from admission.   11:30 PM Spoke with Dr. Amada JupiterKirkpatrick who will consult on the patient.   Discussed patient's case with Hospitalist to request admission. Patient and family (if present) updated with plan. Care transferred to Hospitalist service.  I reviewed all nursing notes, vitals, pertinent old records, EKGs, labs, imaging (as available).  ____________________________________________  FINAL CLINICAL IMPRESSION(S) / ED DIAGNOSES  Final diagnoses:  Stroke-like symptoms    Note:  This document was prepared using Dragon voice recognition software and may  include unintentional dictation errors.  Alona BeneJoshua Finnean Cerami, MD Emergency Medicine    Seylah Wernert, Arlyss RepressJoshua G, MD 04/17/17 [phone]

## 2017-04-17 NOTE — ED Triage Notes (Signed)
To ED for eval left side facial droop and slurred speech - this started Monday. Hx of 3 strokes prior with minimal residual. Grips strong. Noted facial droop and slurred speech. Reported choking on food and drink at home the past few days.

## 2017-04-17 NOTE — Telephone Encounter (Signed)
Ok to do or do you need to see her?

## 2017-04-17 NOTE — Telephone Encounter (Signed)
Husband is calling to reports that his wife has had major changes this week- he thinks she may be having "mini-strokes". Patient has had regression in her strength, she is slurring speech more and she can't keep her balance. She is getting choked on food and drink. Advised husband she needs to be evaluated at ED. [facility]. Reason for Disposition . [1] SEVERE weakness (i.e., unable to walk or barely able to walk, requires support) AND [2] new onset or worsening  Answer Assessment - Initial Assessment Questions 1. SYMPTOM: "What is the main symptom you are concerned about?" (e.g., weakness, numbness)     Regression of strength- both sides, trouble keeping balance, slurring words more 2. ONSET: "When did this start?" (minutes, hours, days; while sleeping)     this week- Monday 3. LAST NORMAL: "When was the last time you were normal (no symptoms)?"     She has not felt normal since she had the strokes 4. PATTERN "Does this come and go, or has it been constant since it started?"  "Is it present now?"     constant 5. CARDIAC SYMPTOMS: "Have you had any of the following symptoms: chest pain, difficulty breathing, palpitations?"     no 6. NEUROLOGIC SYMPTOMS: "Have you had any of the following symptoms: headache, dizziness, vision loss, double vision, changes in speech, unsteady on your feet?"     Changes in speech, unsteady on feet 7. OTHER SYMPTOMS: "Do you have any other symptoms?"     Trouble swallowing - food and drink 8. PREGNANCY: "Is there any chance you are pregnant?" "When was your last menstrual period?"     n/a  Protocols used: NEUROLOGIC DEFICIT-A-AH

## 2017-04-18 ENCOUNTER — Observation Stay (HOSPITAL_COMMUNITY): Payer: Medicaid Other

## 2017-04-18 ENCOUNTER — Encounter (HOSPITAL_COMMUNITY): Payer: Self-pay | Admitting: General Practice

## 2017-04-18 ENCOUNTER — Telehealth: Payer: Self-pay | Admitting: Family Medicine

## 2017-04-18 ENCOUNTER — Other Ambulatory Visit: Payer: Self-pay

## 2017-04-18 DIAGNOSIS — I10 Essential (primary) hypertension: Secondary | ICD-10-CM

## 2017-04-18 DIAGNOSIS — R2990 Unspecified symptoms and signs involving the nervous system: Secondary | ICD-10-CM | POA: Diagnosis not present

## 2017-04-18 DIAGNOSIS — N183 Chronic kidney disease, stage 3 unspecified: Secondary | ICD-10-CM

## 2017-04-18 DIAGNOSIS — I639 Cerebral infarction, unspecified: Secondary | ICD-10-CM

## 2017-04-18 DIAGNOSIS — R29810 Facial weakness: Secondary | ICD-10-CM | POA: Diagnosis present

## 2017-04-18 DIAGNOSIS — N1831 Chronic kidney disease, stage 3a: Secondary | ICD-10-CM | POA: Insufficient documentation

## 2017-04-18 DIAGNOSIS — E1159 Type 2 diabetes mellitus with other circulatory complications: Secondary | ICD-10-CM | POA: Diagnosis not present

## 2017-04-18 LAB — GLUCOSE, CAPILLARY
GLUCOSE-CAPILLARY: 106 mg/dL — AB (ref 65–99)
GLUCOSE-CAPILLARY: 110 mg/dL — AB (ref 65–99)
GLUCOSE-CAPILLARY: 125 mg/dL — AB (ref 65–99)
Glucose-Capillary: 118 mg/dL — ABNORMAL HIGH (ref 65–99)

## 2017-04-18 LAB — LIPID PANEL
Cholesterol: 219 mg/dL — ABNORMAL HIGH (ref 0–200)
HDL: 79 mg/dL (ref >40)
LDL Cholesterol: 130 mg/dL — ABNORMAL HIGH (ref 0–99)
Total CHOL/HDL Ratio: 2.8 RATIO
Triglycerides: 51 mg/dL (ref <150)
VLDL: 10 mg/dL (ref 0–40)

## 2017-04-18 MED ORDER — PIOGLITAZONE HCL 30 MG PO TABS
30.0000 mg | ORAL_TABLET | Freq: Every day | ORAL | Status: DC
  Administered 2017-04-18 – 2017-04-20 (×3): 30 mg via ORAL
  Filled 2017-04-18 (×3): qty 1

## 2017-04-18 MED ORDER — POLYETHYLENE GLYCOL 3350 17 G PO PACK: 17.0000 g | PACK | Freq: Every day | ORAL | Status: DC | PRN

## 2017-04-18 MED ORDER — ESCITALOPRAM OXALATE 10 MG PO TABS
10.0000 mg | ORAL_TABLET | Freq: Every day | ORAL | Status: DC
  Administered 2017-04-18: 10 mg via ORAL
  Filled 2017-04-18 (×2): qty 1

## 2017-04-18 MED ORDER — LISINOPRIL 20 MG PO TABS
40.0000 mg | ORAL_TABLET | Freq: Every day | ORAL | Status: DC
  Administered 2017-04-18 – 2017-04-20 (×3): 40 mg via ORAL
  Filled 2017-04-18 (×3): qty 2

## 2017-04-18 MED ORDER — ACETAMINOPHEN 650 MG RE SUPP: 650.0000 mg | RECTAL | Status: DC | PRN

## 2017-04-18 MED ORDER — INSULIN ASPART 100 UNIT/ML ~~LOC~~ SOLN
0.0000 [IU] | Freq: Three times a day (TID) | SUBCUTANEOUS | Status: DC
  Administered 2017-04-19: 1 [IU] via SUBCUTANEOUS

## 2017-04-18 MED ORDER — SODIUM CHLORIDE 0.9 % IV SOLN
INTRAVENOUS | Status: DC
  Administered 2017-04-18: 1000 mL via INTRAVENOUS

## 2017-04-18 MED ORDER — FLUTICASONE PROPIONATE 50 MCG/ACT NA SUSP
2.0000 | Freq: Every day | NASAL | Status: DC
  Administered 2017-04-18 – 2017-04-20 (×3): 2 via NASAL
  Filled 2017-04-18: qty 16

## 2017-04-18 MED ORDER — ENOXAPARIN SODIUM 40 MG/0.4ML ~~LOC~~ SOLN
40.0000 mg | SUBCUTANEOUS | Status: DC
  Administered 2017-04-18 – 2017-04-19 (×2): 40 mg via SUBCUTANEOUS
  Filled 2017-04-18 (×3): qty 0.4

## 2017-04-18 MED ORDER — LINAGLIPTIN 5 MG PO TABS
5.0000 mg | ORAL_TABLET | Freq: Every day | ORAL | Status: DC
  Administered 2017-04-18 – 2017-04-20 (×3): 5 mg via ORAL
  Filled 2017-04-18 (×3): qty 1

## 2017-04-18 MED ORDER — TRAZODONE HCL 50 MG PO TABS
50.0000 mg | ORAL_TABLET | Freq: Once | ORAL | Status: AC
  Administered 2017-04-18: 50 mg via ORAL
  Filled 2017-04-18: qty 1

## 2017-04-18 MED ORDER — ATORVASTATIN CALCIUM 40 MG PO TABS
40.0000 mg | ORAL_TABLET | Freq: Every day | ORAL | Status: DC
  Administered 2017-04-18: 40 mg via ORAL
  Filled 2017-04-18 (×2): qty 1

## 2017-04-18 MED ORDER — ACETAMINOPHEN 160 MG/5ML PO SOLN: 650.0000 mg | ORAL | Status: DC | PRN

## 2017-04-18 MED ORDER — ACETAMINOPHEN 325 MG PO TABS: 650.0000 mg | ORAL_TABLET | ORAL | Status: DC | PRN

## 2017-04-18 MED ORDER — PREDNISOLONE ACETATE 1 % OP SUSP
1.0000 [drp] | Freq: Two times a day (BID) | OPHTHALMIC | Status: DC
  Administered 2017-04-18 – 2017-04-20 (×6): 1 [drp] via OPHTHALMIC
  Filled 2017-04-18: qty 1

## 2017-04-18 MED ORDER — AMLODIPINE BESYLATE 10 MG PO TABS
10.0000 mg | ORAL_TABLET | Freq: Every day | ORAL | Status: DC
  Administered 2017-04-18: 10 mg via ORAL
  Filled 2017-04-18: qty 1

## 2017-04-18 MED ORDER — ASPIRIN EC 325 MG PO TBEC
325.0000 mg | DELAYED_RELEASE_TABLET | Freq: Every day | ORAL | Status: DC
  Administered 2017-04-18 – 2017-04-20 (×3): 325 mg via ORAL
  Filled 2017-04-18 (×3): qty 1

## 2017-04-18 MED ORDER — STROKE: EARLY STAGES OF RECOVERY BOOK
Freq: Once | Status: AC
  Administered 2017-04-18: 1

## 2017-04-18 NOTE — Telephone Encounter (Signed)
She actually already has a neurologist

## 2017-04-18 NOTE — Progress Notes (Signed)
  PROGRESS NOTE  Stephanie ShirtsMarva N Lloyd ZOX:[id number] DOB: 05/29/1970 DOA: 04/17/2017 PCP: Donato SchultzLowne Lloyd, Stephanie R, DO  Brief Narrative: 47 year old woman PMH multiple strokes, diabetes mellitus, presented with dysphagia, dysarthria.  Admitted for further evaluation of neurologic symptoms.  Assessment/Plan Acute ischemic infarct left periventricular white matter with dysphagia, dysarthria, facial droop.  No hemorrhage.   LDL 130.  CT had no acute abnormalities. --Further workup as directed by stroke service.  Continue aspirin, statin. --Regular diet with thin liquids per speech therapy.  Outpatient speech-language pathology. --Outpatient PT with, neurology OP PT on [address].  Outpatient OT.  PMH CVA x4  Diabetes mellitus type 2, polyneuropathy, chronic kidney disease --Blood sugars stable  Chronic kidney disease stage III, appears close to baseline.  Essential hypertension --Permissive hypertension  DVT prophylaxis: Enoxaparin Code Status: Full Family Communication: None Disposition Plan: Home   [name], MD  [facility] Direct contact: [phone] --Via amion app OR  --[email or web address]; password [email or web address]  7PM-7AM contact night coverage as above 04/18/2017, 5:22 PM  LOS: 0 days   Consultants:  Neurology  Procedures:    Antimicrobials:    Interval history/Subjective: Feels okay.  Just feels weak.  Swallowing okay.  Objective: Vitals:  Vitals:   04/18/17 0742 04/18/17 1128  BP: (!) 145/96   Pulse:    Resp:    Temp: 97.9 F (36.6 C) 97.9 F (36.6 C)  SpO2:      Exam:  Constitutional:  . Appears calm and comfortable, eating dinner Eyes:  . pupils and irises appear normal ENMT:  . grossly normal hearing  Respiratory:  . CTA bilaterally but breath sounds diminished, no w/r/r.  . Respiratory effort normal Cardiovascular:  . RRR, no m/r/g . No LE extremity edema   Musculoskeletal:  . RUE, LUE, RLE, LLE   . Moves all extremities to  command Neurologic:  . Left facial weakness noted Psychiatric:  . Mental status o Mood, affect appropriate   I have personally reviewed the following:   Labs:  Basic metabolic panel unremarkable, creatinine 1.50   LDL 130  CBC unremarkable  Imaging studies:  CT head, MRI brain noted  Medical tests:  EKG sinus rhythm, no acute changes  Scheduled Meds: . amLODipine  10 mg Oral Daily  . aspirin  325 mg Oral Daily  . atorvastatin  40 mg Oral q1800  . enoxaparin (LOVENOX) injection  40 mg Subcutaneous Q24H  . escitalopram  10 mg Oral Daily  . fluticasone  2 spray Each Nare Daily  . insulin aspart  0-9 Units Subcutaneous TID WC  . linagliptin  5 mg Oral Daily  . lisinopril  40 mg Oral Daily  . pioglitazone  30 mg Oral Daily  . prednisoLONE acetate  1 drop Both Eyes BID   Continuous Infusions: . sodium chloride 1,000 mL (04/18/17 0325)    Principal Problem:   Ischemic stroke (HCC) Active Problems:   Essential hypertension   Type 2 diabetes mellitus with other circulatory complications (HCC)   Facial droop   CKD (chronic kidney disease), stage III (HCC)   LOS: 0 days

## 2017-04-18 NOTE — H&P (Signed)
History and Physical    Stephanie Lloyd FFM:[id number] DOB: Apr 05, 1970 DOA: 04/17/2017  PCP: Ann Held, DO   Patient coming from: Home   Chief Complaint: Choking, facial droop, falls  HPI: Stephanie Lloyd is a 47 y.o. female with medical history significant for hemorrhagic and ischemic stroke, DM2, HTN, HLD, who presented to the ED with complaints of worsening facial droop, choking episodes, slurred speech, decreased strengths in extremities with falls of 3 days duration.  Patient has deficits from prior strokes but spouse at bedside states that his symptoms are worse. Patient and spouse moved in with her father, she ambulates independently at baseline, without use of walker or cane, this has not changed.  Spouse reports patient has been falling, and her gait is abnormal.  Patient denies fever or chills, no cough or shortness of breath, vomiting diarrhea no abdominal pain, no dysuria or frequency, no myalgias or rhinorrhea, no sore throat.  ED Course: Systolic blood pressure [id number], temperature 97.5, CBC BMP creatinine unremarkable.  Head CT negative for acute abnormality.  Neurology was consulted in the ED, impression-recurrent pontine stroke Vs recrudescence of previous stroke symptoms, .  Hospitalist was called to admit CVA workup.  Review of Systems: As per HPI otherwise 10 point review of systems negative.   Past Medical History:  Diagnosis Date  . Diabetes ([place])   . Diabetes mellitus without complication ([place])   . Diastolic dysfunction   . Hypertension   . Immune deficiency disorder ([place])   . Menometrorrhagia 04/01/2016  . Stroke [facility])     Past Surgical History:  Procedure Laterality Date  . NO PAST SURGERIES       reports that she has never smoked. She has never used smokeless tobacco. She reports that she does not drink alcohol or use drugs.  Allergies  Allergen Reactions  . Metformin And Related Diarrhea    Family History  Problem Relation Age of  Onset  . Diabetes Father   . Hypertension Father   . Stroke Father   . Diabetes Paternal Uncle   . Stroke Paternal Uncle   . Stroke Brother   . Cancer Neg Hx   . Breast cancer Neg Hx   . Ovarian cancer Neg Hx     Prior to Admission medications   Medication Sig Start Date End Date Taking? Authorizing Provider  amLODipine (NORVASC) 10 MG tablet Take 1 tablet (10 mg total) by mouth daily. 07/08/16  Yes Ann Held, DO  aspirin EC 325 MG EC tablet Take 1 tablet (325 mg total) by mouth daily. 03/02/16  Yes Riccio, Angela C, DO  fluticasone (FLONASE) 50 MCG/ACT nasal spray Place 2 sprays into both nostrils daily. 10/01/16  Yes Roma Schanz R, DO  linagliptin (TRADJENTA) 5 MG TABS tablet Take 1 tablet (5 mg total) by mouth daily. 03/31/17  Yes Renato Shin, MD  lisinopril (PRINIVIL,ZESTRIL) 40 MG tablet Take 1 tablet (40 mg total) by mouth daily. 01/16/17  Yes Roma Schanz R, DO  pioglitazone (ACTOS) 30 MG tablet Take 1 tablet (30 mg total) by mouth daily. 02/28/17  Yes Roma Schanz R, DO  prednisoLONE acetate (PRED FORTE) 1 % ophthalmic suspension Place 1 drop into both eyes 2 (two) times daily.    Yes [provider]  solifenacin (VESICARE) 10 MG tablet Take 1 tablet (10 mg total) by mouth daily. 03/04/17  Yes Roma Schanz R, DO  ACCU-CHEK SOFTCLIX LANCETS lancets Use as directed twice a day.  Dx Code: E11.9 01/31/17   Carollee Herter, Alferd Apa, DO  Blood Glucose Monitoring Suppl (ACCU-CHEK AVIVA PLUS) w/Device KIT Use as directed twice a day.  Dx Code: E11.9 01/31/17   Carollee Herter, Alferd Apa, DO  docusate sodium (COLACE) 100 MG capsule Take 1 capsule (100 mg total) by mouth 2 (two) times daily. Patient not taking: Reported on 04/17/2017 08/30/16 08/30/17  Rosita Fire, MD  escitalopram (LEXAPRO) 10 MG tablet Take 1 tablet (10 mg total) by mouth daily. Patient not taking: Reported on 04/17/2017 01/16/17   Roma Schanz R, DO  glucose blood (ACCU-CHEK AVIVA  PLUS) test strip Use as directed twice a day.  Dx Code: E11.9 01/31/17   Carollee Herter, Alferd Apa, DO  imiquimod (ALDARA) 5 % cream Apply topically 3 (three) times a week. Apply until total clearance or maximum of 16 weeks Patient not taking: Reported on 04/17/2017 08/16/16   Lavonia Drafts, MD  Insulin Pen Needle (B-D UF III MINI PEN NEEDLES) 31G X 5 MM MISC To use with insulin 10/17/16   Carollee Herter, Alferd Apa, DO  polyethylene glycol (MIRALAX / GLYCOLAX) packet Take 17 g by mouth daily. Patient not taking: Reported on 04/17/2017 08/30/16   Rosita Fire, MD  Vitamin D, Ergocalciferol, (DRISDOL) 50000 units CAPS capsule Take 1 capsule (50,000 Units total) by mouth every 7 (seven) days. Patient not taking: Reported on 04/17/2017 08/02/16   Lyndal Pulley, DO    Physical Exam: Vitals:   04/17/17 2245 04/18/17 0000 04/18/17 0015 04/18/17 0045  BP: (!) 158/93 (!) 141/81 (!) 144/73 (!) 140/91  Pulse: 72 70 72 73  Resp:  '19 11 14  ' Temp:      TempSrc:      SpO2: 100% 98% 100% 97%    Constitutional: NAD, calm, comfortable Vitals:   04/17/17 2245 04/18/17 0000 04/18/17 0015 04/18/17 0045  BP: (!) 158/93 (!) 141/81 (!) 144/73 (!) 140/91  Pulse: 72 70 72 73  Resp:  '19 11 14  ' Temp:      TempSrc:      SpO2: 100% 98% 100% 97%   Eyes: PERRL, lids and conjunctivae normal ENMT: Mucous membranes are moist. Posterior pharynx clear of any exudate or lesions.Normal dentition.  Neck: normal, supple, no masses, no thyromegaly Respiratory: clear to auscultation bilaterally, no wheezing, no crackles. Normal respiratory effort. No accessory muscle use.  Cardiovascular: Regular rate and rhythm, no murmurs / rubs / gallops. No extremity edema. 2+ pedal pulses. No carotid bruits.  Abdomen: no tenderness, no masses palpated. No hepatosplenomegaly. Bowel sounds positive.  Musculoskeletal: no clubbing / cyanosis. No joint deformity upper and lower extremities. Good ROM, no contractures. Normal muscle  tone.  Skin: no rashes, lesions, ulcers. No induration Neurologic: mild Right facial droop, otherwise no obvious cranial nerve deficits, .strength 5/5 in upper extremities, 4+/5-  Right, 5/5- left lower extremities. Psychiatric: Normal judgment and insight. Alert and oriented x 3. Normal mood.    Labs on Admission: I have personally reviewed following labs and imaging studies  CBC: Recent Labs  Lab 04/17/17 1556 04/17/17 1621  WBC 7.8  --   NEUTROABS 5.4  --   HGB 12.4 13.3  HCT 39.0 39.0  MCV 88.2  --   PLT 194  --    Basic Metabolic Panel: Recent Labs  Lab 04/17/17 1556 04/17/17 1621  NA 139 141  K 4.4 4.4  CL 105 103  CO2 27  --   GLUCOSE 127* 123*  BUN 23*  26*  CREATININE 1.39* 1.50*  CALCIUM 9.1  --    Liver Function Tests: Recent Labs  Lab 04/17/17 1556  AST 19  ALT 14  ALKPHOS 72  BILITOT 0.4  PROT 7.0  ALBUMIN 3.8   Coagulation Profile: Recent Labs  Lab 04/17/17 1556  INR 1.11   Urine analysis:    Component Value Date/Time   COLORURINE STRAW (A) 08/29/2016 2130   APPEARANCEUR CLEAR 08/29/2016 2130   LABSPEC 1.018 08/29/2016 2130   PHURINE 7.0 08/29/2016 2130   GLUCOSEU >=500 (A) 08/29/2016 2130   HGBUR SMALL (A) 08/29/2016 2130   BILIRUBINUR neg 03/04/2017 1346   KETONESUR NEGATIVE 08/29/2016 2130   PROTEINUR neg 03/04/2017 1346   PROTEINUR NEGATIVE 08/29/2016 2130   UROBILINOGEN 0.2 03/04/2017 1346   UROBILINOGEN 0.2 12/12/2011 1135   NITRITE neg 03/04/2017 1346   NITRITE NEGATIVE 08/29/2016 2130   LEUKOCYTESUR Negative 03/04/2017 1346    Radiological Exams on Admission: Ct Head Wo Contrast  Result Date: 04/17/2017 CLINICAL DATA:  Generalized weakness with unsteady gait. History of CVA. EXAM: CT HEAD WITHOUT CONTRAST TECHNIQUE: Contiguous axial images were obtained from the base of the skull through the vertex without intravenous contrast. COMPARISON:  MRI 02/28/2016, CT 02/28/2016 FINDINGS: Brain: Redemonstration of moderate  periventricular white matter hypo density compatible with chronic small vessel ischemia. Chronic small basal ganglial lacunar infarcts are noted on the right as well as within the right thalamus. No acute intracranial hemorrhage, large vascular territory infarct, midline shift or edema. No intra-axial mass nor extra-axial collections. Mild superficial and moderate central atrophy is noted sulcal and ventricular prominence. Midline fourth ventricle basal cisterns without effacement. Vascular: No hyperdense vessel sign or unexpected calcification. Skull: Negative for acute fracture. Negative for suspicious osseous lesions. Sinuses/Orbits: Clear bilateral mastoids. Intact orbits and globes. No acute sinus disease. Probable small mucous retention cyst in the anterior left maxillary sinus. Other: None IMPRESSION: Chronic moderate small vessel ischemic disease of periventricular white matter. No acute intracranial abnormality. Electronically Signed   By: Ashley Royalty M.D.   On: 04/17/2017 18:09   EKG: Independently reviewed.  Sinus rhythm no change from prior.  Assessment/Plan Active Problems:   Essential hypertension   Hemorrhagic stroke (HCC)   Type 2 diabetes mellitus with other circulatory complications (HCC)   Ischemic stroke (HCC)   OSA (obstructive sleep apnea)   Facial droop  Facial droop- with falls, abnormal gait, choking episodes.  Pt with prior CVA deficits per patient/spouse this is worse. - NPO -Swallow evaluation -Neurology recommendations appreciated-recurrent pontine stroke versus recrudescence of previous stroke symptoms. - MRI-per neurology if MRI is positive would start Plavix in addition to aspirin for 3 weeks - PT/OT/ST eval -Continue home aspirin - Pt not on statin, will start Atorvastatin 38m daily -Aspiration precautions - 03/31/17 HgbA1c 7 - Lipid panel -Further workup with echo and carotid imaging per neurology/ if MRI positive  Hx of ischemic and hemorrhagic  stroke -Continue home aspirin and statins  DM 2- glucose 127.  Recent A1c 7. - Continue home linagliptin, pioglitazone - SS- S  HTN-systolic [id number]-Symptoms > times 48 hours ago -Continue home antihypertensive Norvasc, lisinopril   DVT prophylaxis: Lovenox Code Status: Full Family Communication: Spouse at bedside Disposition Plan: 1- 2 days Consults called: Neurology Admission status: Obs, tele   EBethena RoysMD [facility] Pager [phone] 6PM-2AM.  Otherwise please contact night-coverage [email or web address] Password [email or web address]  04/18/2017, 1:11 AM

## 2017-04-18 NOTE — Telephone Encounter (Signed)
Spoke to pt's son who states that he is currently in [place] but has spoken with the hospitalist. Pt's son would like to know if Dr. Laury AxonLowne would be willing to provide either the hospitalist or the family with any insight on the best treatment options for the pt since Dr. Laury AxonLowne has been the pt's primary care and is most familiar with her health. Pt's son would also like to know there's a neurologist Dr. Laury AxonLowne would like to refer the pt to once she is out of the hospital now that it is confirmed that she has had a stroke.   I informed the pt's son that the hospital is able to see all of the patient's history which includes the care she has received from Dr. Laury AxonLowne. As far as the referral to neurology I advised the pt's son that the hospital will do what they feel is necessary to provide the appropriate care to the pt at this time and if they find it necessary to refer her to neurology they will make sure that is set up by the time the pt is discharged and that the hospital will usually recommend a timeframe of when to follow up with Dr. Laury AxonLowne after the pt's hospital visit.

## 2017-04-18 NOTE — Telephone Encounter (Signed)
Called the patient today 04/18/2017 and spoke to her husband, [facility]

## 2017-04-18 NOTE — Evaluation (Signed)
Clinical/Bedside Swallow Evaluation Patient Details  Name: Stephanie Lloyd MRN: [id number] Date of Birth: 1970-09-20  Today's Date: 04/18/2017 Time: SLP Start Time (ACUTE ONLY): 1005 SLP Stop Time (ACUTE ONLY): 1020 SLP Time Calculation (min) (ACUTE ONLY): 15 min  Past Medical History:  Past Medical History:  Diagnosis Date  . Diabetes (HCC)   . Diabetes mellitus without complication (HCC)   . Diastolic dysfunction   . Hypertension   . Immune deficiency disorder (HCC)   . Menometrorrhagia 04/01/2016  . Stroke [facility])    Past Surgical History:  Past Surgical History:  Procedure Laterality Date  . NO PAST SURGERIES     HPI:  Stephanie Lloyd a 47 y.o.femalewith medical history significantforhemorrhagic and ischemic stroke, DM2, HTN, HLD,who presented to the ED with complaints of worsening facial droop, chokingepisodes,slurred speech,decreased strengthinextremities with falls of 3 days duration. Patient has deficits from prior strokes but spouse at bedside states that his symptoms are worse. Patientand spouse moved in with her father, sheambulates independently at baseline,without use of walker or cane,this has not changed.Spouse reports patient has been falling,and her gait is abnormal.  Patient denies fever or chills,no cough or shortness of breath,vomiting diarrhea no abdominal pain,no dysuria or frequency,no myalgias or rhinorrhea, nosore throat.  MRI is showing 7mm acute ischemice infarct in the left periventricular matter as well as a small infarct in the deep white matter in the anterior right frontal lobe.     Assessment / Plan / Recommendation Clinical Impression  Clinical swallowing evaluation was completed in setting of new CVA.  Patient with previous CVA's with baseline dysarthria and oral motor deficits.  However, the patient is reporting new left sided facial/labial weakness.  Oral mechnism exam revealed left side facial droop as well as labial weakness  particularly on the left side.  Lingual ROM wass good with mild deficits in strength noted on the left.  The patient's oral and pharyngeal swallow appeared to be functional.  Swallow trigger appeared to be timely and hyo-laryngeal excursion was appreciated to palpation.  Mastication of dry solids appeared to be functional.  No oral residue seen after dry solids.  No overt s/s of aspiration were seen,  In addition, the patient passed a 3 oz water challenge.  Recommend begin a regular diet with thin liquids.  ST will follow briefly for therapeutic diet tolerance in setting of new CVA.   SLP Visit Diagnosis: Dysphagia, unspecified (R13.10)    Aspiration Risk  Mild aspiration risk    Diet Recommendation   Regular with thin liquids  Medication Administration: Whole meds with liquid    Other  Recommendations Oral Care Recommendations: Oral care BID   Follow up Recommendations None      Frequency and Duration min 1 x/week  2 weeks       Prognosis Prognosis for Safe Diet Advancement: Good      Swallow Study   General Date of Onset: 04/17/17 HPI: Stephanie Lloyd a 76 y.o.femalewith medical history significantforhemorrhagic and ischemic stroke, DM2, HTN, HLD,who presented to the ED with complaints of worsening facial droop, chokingepisodes,slurred speech,decreased strengthinextremities with falls of 3 days duration. Patient has deficits from prior strokes but spouse at bedside states that his symptoms are worse. Patientand spouse moved in with her father, sheambulates independently at baseline,without use of walker or cane,this has not changed.Spouse reports patient has been falling,and her gait is abnormal.  Patient denies fever or chills,no cough or shortness of breath,vomiting diarrhea no abdominal pain,no dysuria or frequency,no myalgias  or rhinorrhea, nosore throat.  MRI is showing 7mm acute ischemice infarct in the left periventricular matter as well as a small  infarct in the deep white matter in the anterior right frontal lobe.   Type of Study: Bedside Swallow Evaluation Previous Swallow Assessment: Previous MBS 03/18/2016 with recommendation for a regular diet with thin liquids. Diet Prior to this Study: NPO Temperature Spikes Noted: No Respiratory Status: Room air History of Recent Intubation: No Behavior/Cognition: Alert;Cooperative;Pleasant mood Oral Cavity Assessment: Within Functional Limits Oral Care Completed by SLP: No Oral Cavity - Dentition: Adequate natural dentition Vision: Functional for self-feeding Self-Feeding Abilities: Able to feed self Patient Positioning: Upright in bed Baseline Vocal Quality: Low vocal intensity Volitional Cough: Weak Volitional Swallow: Able to elicit    Oral/Motor/Sensory Function Overall Oral Motor/Sensory Function: Mild impairment Facial ROM: Reduced left;Suspected CN VII (facial) dysfunction Facial Symmetry: Abnormal symmetry left;Suspected CN VII (facial) dysfunction Facial Strength: Reduced left;Suspected CN VII (facial) dysfunction Lingual ROM: Within Functional Limits Lingual Symmetry: Within Functional Limits Lingual Strength: Reduced Mandible: Within Functional Limits   Ice Chips Ice chips: Not tested   Thin Liquid Thin Liquid: Within functional limits Presentation: Cup;Spoon;Straw;Self Fed    Nectar Thick Nectar Thick Liquid: Not tested   Honey Thick Honey Thick Liquid: Not tested   Puree Puree: Within functional limits Presentation: Spoon   Solid   GO   Solid: Within functional limits Presentation: Self Fed       Dimas AguasMelissa Alida Greiner, MA, CCC-SLP Acute Rehab SLP [phone]  Fleet ContrasMelissa N Tanny Harnack 04/18/2017,10:59 AM

## 2017-04-18 NOTE — Telephone Encounter (Signed)
Copied from CRM [phone]. Topic: General - Other >> Apr 18, 2017 10:43 AM Cecelia ByarsGreen, Keron Koffman L, RMA wrote: Reason for CRM: Patient's son timothy is requesting a call back from Dr. Zola ButtonLowne Chase concerning patient is currently in the hospital she suffered a stroke and pt's son has a couple of questions, please return call [phone]

## 2017-04-18 NOTE — Telephone Encounter (Signed)
We don't go to the hospital --- he would have to talk to the hospitalist

## 2017-04-18 NOTE — Consult Note (Signed)
Neurology Consultation Reason for Consult: Dysarthria Referring Physician: Long, J  CC: Dysphagia  History is obtained from: Patient  HPI: Stephanie Lloyd is a 47 y.o. female with a history of previous stroke who presents with dysphagia and dysarthria.  She has a history of multiple pontine infarctions states that her current symptoms are similar to her previous.  She states that her symptoms began Monday and have been gradually worsening.   She denies any antecedent illness, chest pain, nausea, vomiting, diplopia, burning with urination, or any other symptoms other than the ones associated with her stroke.   LKW: Monday tpa given?: no, out of window  ROS: A 14 point ROS was performed and is negative except as noted in the HPI.   Past Medical History:  Diagnosis Date  . Diabetes (HCC)   . Diabetes mellitus without complication (HCC)   . Diastolic dysfunction   . Hypertension   . Immune deficiency disorder (HCC)   . Menometrorrhagia 04/01/2016  . Stroke [facility])      Family History  Problem Relation Age of Onset  . Diabetes Father   . Hypertension Father   . Stroke Father   . Diabetes Paternal Uncle   . Stroke Paternal Uncle   . Stroke Brother   . Cancer Neg Hx   . Breast cancer Neg Hx   . Ovarian cancer Neg Hx      Social History:  reports that she has never smoked. She has never used smokeless tobacco. She reports that she does not drink alcohol or use drugs.   Exam: Current vital signs: BP (!) 158/93   Pulse 72   Temp (!) 97.5 F (36.4 C) (Oral)   Resp 18   SpO2 100%  Vital signs in last 24 hours: Temp:  [97.5 F (36.4 C)] 97.5 F (36.4 C) (03/21 1729) Pulse Rate:  [68-72] 72 (03/21 2245) Resp:  [16-18] 18 (03/21 2200) BP: (138-161)/(85-93) 158/93 (03/21 2245) SpO2:  [99 %-100 %] 100 % (03/21 2245)   Physical Exam  Constitutional: Appears well-developed and well-nourished.  Psych: Affect appropriate to situation Eyes: No scleral injection HENT: No  OP obstrucion Head: Normocephalic.  Cardiovascular: Normal rate and regular rhythm.  Respiratory: Effort normal, non-labored breathing GI: Soft.  No distension. There is no tenderness.  Skin: WDI  Neuro: Mental Status: Patient is awake, alert, oriented to person, place, month, year, and situation. Patient is able to give a clear and coherent history. No signs of aphasia or neglect Cranial Nerves: II: Visual Fields are full. Pupils are equal, round, and reactive to light.   III,IV, VI: EOMI without ptosis or diploplia.  V: Facial sensation is symmetric to temperature VII: Facial movement with mild left facial weakness VIII: hearing is intact to voice X: Uvula does not elevate very much on either side XII: tongue is midline without atrophy or fasciculations.  Motor: She has 5/5 strength in the left, she has mild right pronator drift and 4/5 strength in the right leg. Sensory: Sensation is symmetric to light touch and temperature in the arms and legs. Cerebellar: FNF and HKS are intact bilaterally  I have reviewed labs in epic and the results pertinent to this consultation are: CMP-borderline creatinine  I have reviewed the images obtained: CT head-no acute findings  Impression: 47 year old female with recurrent pontine stroke versus recrudescence of previous stroke symptoms.  Recommendations: 1) MRI brain 2) continue antiplatelet therapy with aspirin 3) LDL, A1c  4) if MRI is positive, would start Plavix in  addition to aspirin for 3 weeks 5) speech therapy 6) PT, OT    Ritta SlotMcNeill Jordon Bourquin, MD [facility] [phone]  If 7pm- 7am, please page neurology on call as listed in AMION.

## 2017-04-18 NOTE — Evaluation (Signed)
Occupational Therapy Evaluation Patient Details Name: Stephanie Lloyd MRN: [id number] DOB: Nov 02, 1970 Today's Date: 04/18/2017    History of Present Illness Zariel Capano is a 47yo black female who comes to [facility] on 04/18/17 after 3d intermittent choking episodes, slurred speech, BLE weakness, and facial droop. PMH: CVA, DM2, HTN, HLD. AT baseline patient AMB mostly hosuehodl distances without AD, electric cart at the store, independent in ADL, and help with IADL.    Clinical Impression   Pt admitted with above. She demonstrates the below listed deficits and will benefit from continued OT to maximize safety and independence with BADLs.  Pt demonstrates subjective Rt UE weakness (reports she had residual weakness previously from prior CVA, but worse), and mild Lt UE weakness subjectively as well.  She requires min guard assist for ADLs, with occasional LOB.  She lives with spouse who can assist as needed at discharge.  Recommend OPOT.       Follow Up Recommendations  Outpatient OT;Supervision/Assistance - 24 hour    Equipment Recommendations  None recommended by OT    Recommendations for Other Services       Precautions / Restrictions Precautions Precautions: Fall Restrictions Weight Bearing Restrictions: No      Mobility Bed Mobility Overal bed mobility: Modified Independent             General bed mobility comments: up in chair   Transfers Overall transfer level: Needs assistance Equipment used: None;Rolling walker (2 wheeled) Transfers: Sit to/from [employer] Sit to Stand: Min guard;Min assist Stand pivot transfers: Min guard       General transfer comment: first attempt standing, required min A to power up from chair.  second attempt min guard assist     Balance Overall balance assessment: Needs assistance Sitting-balance support: Feet supported Sitting balance-Leahy Scale: Good     Standing balance support: During functional  activity Standing balance-Leahy Scale: Fair Standing balance comment: Pt able to simulate shower in standing with close min guard assist, but LOB posteriorly when turning to the left                              ADL either performed or assessed with clinical judgement   ADL Overall ADL's : Needs assistance/impaired Eating/Feeding: Independent   Grooming: Wash/dry hands;Wash/dry face;Oral care;Brushing hair;Min guard;Standing   Upper Body Bathing: Set up;Sitting   Lower Body Bathing: Min guard;Sit to/from stand Lower Body Bathing Details (indicate cue type and reason): simulated shower in standing with min guard assist  Upper Body Dressing : Set up;Sitting   Lower Body Dressing: Min guard;Sit to/from stand   Toilet Transfer: Min guard;Ambulation;Comfort height toilet;Grab bars;RW   Toileting- Architect and Hygiene: Min guard;Sit to/from stand       Functional mobility during ADLs: Min guard;Rolling walker General ADL Comments: Pt reports her spouse will be with her for showers.  She has a shower seat, but it does not fit in her shower      Vision Baseline Vision/History: Wears glasses Wears Glasses: At all times Patient Visual Report: No change from baseline Vision Assessment?: No apparent visual deficits Additional Comments: Pt reports she has to have laser surgery, but is unable to tell therapist why      Perception Perception Perception Tested?: Yes   Praxis Praxis Praxis tested?: Within functional limits    Pertinent Vitals/Pain Pain Assessment: No/denies pain     Hand Dominance Right   Extremity/Trunk Assessment Upper  Extremity Assessment Upper Extremity Assessment: RUE deficits/detail;LUE deficits/detail RUE Deficits / Details: pt reports she had residual weakness Rt UE which is worse now.   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 - movement moving out of synergistic patterns  RUE Coordination: decreased fine  motor;decreased gross motor LUE Deficits / Details: grossly 4-4+/5   Lower Extremity Assessment Lower Extremity Assessment: Defer to PT evaluation   Cervical / Trunk Assessment Cervical / Trunk Assessment: Normal   Communication Communication Communication: Expressive difficulties   Cognition Arousal/Alertness: Awake/alert Behavior During Therapy: WFL for tasks assessed/performed Overall Cognitive Status: Within Functional Limits for tasks assessed                                     General Comments       Exercises Exercises: Other exercises Other Exercises Other Exercises: Pt instructed in shoulder flexion exercises with good alignment of shoulders - performed x 10    Shoulder Instructions      Home Living Family/patient expects to be discharged to:: Private residence Living Arrangements: Spouse/significant other;Parent Available Help at Discharge: Family Type of Home: Mobile home Home Access: Stairs to enter;Ramped entrance     Home Layout: One level     Bathroom Shower/Tub: Tub/shower unit;Curtain   FirefighterBathroom Toilet: Standard     Home Equipment: Environmental consultantWalker - 2 wheels;Shower seat      Lives With: Spouse;Family    Prior Functioning/Environment Level of Independence: Needs assistance  Gait / Transfers Assistance Needed: Pt reports she ambulated mod I without AD  ADL's / Homemaking Assistance Needed: Pt reports mod I with ADLs, assist wtih IADLs.  Does not drive  Communication / Swallowing Assistance Needed: reports mild dysarthria           OT Problem List: Decreased strength;Decreased activity tolerance;Decreased range of motion;Impaired balance (sitting and/or standing);Decreased knowledge of use of DME or AE;Impaired UE functional use      OT Treatment/Interventions: Self-care/ADL training;Therapeutic exercise;DME and/or AE instruction;Neuromuscular education;Therapeutic activities;Patient/family education;Balance training    OT  Goals(Current goals can be found in the care plan section) Acute Rehab OT Goals Patient Stated Goal: to return home  OT Goal Formulation: With patient Time For Goal Achievement: 05/02/17 Potential to Achieve Goals: Good ADL Goals Pt Will Perform Upper Body Bathing: with supervision;standing Pt Will Perform Lower Body Bathing: with supervision;sit to/from stand Pt Will Perform Upper Body Dressing: with modified independence;sitting Pt Will Perform Lower Body Dressing: with modified independence;sit to/from stand Pt Will Transfer to Toilet: with modified independence;ambulating;regular height toilet Pt Will Perform Toileting - Clothing Manipulation and hygiene: with modified independence;sit to/from stand Pt Will Perform Tub/Shower Transfer: Tub transfer;with min guard assist;ambulating;rolling walker Pt/caregiver will Perform Home Exercise Program: Increased ROM;Increased strength;Right Upper extremity;Independently;With written HEP provided  OT Frequency: Min 2X/week   Barriers to D/C:            Co-evaluation              AM-PAC PT "6 Clicks" Daily Activity     Outcome Measure Help from another person eating meals?: None Help from another person taking care of personal grooming?: A Little Help from another person toileting, which includes using toliet, bedpan, or urinal?: A Little Help from another person bathing (including washing, rinsing, drying)?: A Little Help from another person to put on and taking off regular upper body clothing?: A Little Help from another person to put on  and taking off regular lower body clothing?: A Little 6 Click Score: 19   End of Session Equipment Utilized During Treatment: Engineer, water Communication: Mobility status  Activity Tolerance: Patient tolerated treatment well Patient left: in chair;with call bell/phone within reach  OT Visit Diagnosis: Unsteadiness on feet (R26.81);Muscle weakness (generalized) (M62.81)                 Time: 1335-1406 OT Time Calculation (min): 31 min Charges:  OT General Charges $OT Visit: 1 Visit OT Evaluation $OT Eval Moderate Complexity: 1 Mod OT Treatments $Self Care/Home Management : 8-22 mins G-Codes:     Derric Dealmeida, Daniella Dewberry M 04/18/2017, 2:15 PM

## 2017-04-18 NOTE — Evaluation (Signed)
Speech Language Pathology Evaluation Patient Details Name: Stephanie Lloyd MRN: [id number] DOB: 07-20-1970 Today's Date: 04/18/2017 Time: 7253-6644 SLP Time Calculation (min) (ACUTE ONLY): 18 min  Problem List:  Patient Active Problem List   Diagnosis Date Noted  . Facial droop 04/18/2017  . Anxiety 01/16/2017  . Leg cramps 01/16/2017  . Hypoglycemia due to insulin 08/29/2016  . Diverticulosis 08/29/2016  . 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 Diverticulitis 08/29/2016  . 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 OSA (obstructive sleep apnea) 06/10/2016  . Iron deficiency anemia due to chronic blood loss 04/01/2016  . Menometrorrhagia 04/01/2016  . Upper respiratory infection 03/25/2016  . History of stroke   . History of intracerebral hemorrhage without residual deficit   . Cerebrovascular accident (CVA) due to thrombosis of basilar artery (HCC) 02/28/2016  . Ischemic stroke (HCC) 02/28/2016  . Acute kidney injury (HCC) 10/28/2015  . Acute encephalopathy 10/28/2015  . Diastolic dysfunction   . Stroke (cerebrum) (HCC)   . Focal infarction of brain (HCC) 10/08/2015  . Syncope 08/19/2015  . Hypertensive emergency 07/29/2015  . Hypertensive urgency 07/28/2015  . Hypokalemia 07/28/2015  . Accelerated hypertension 09/21/2014  . Type 2 diabetes mellitus with other circulatory complications (HCC) 09/21/2014  . Hyperlipidemia 09/21/2014  . 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 Hyperlipidemia LDL goal <70 07/29/2014  . Hemorrhagic stroke (HCC) 06/22/2014  . ICH (intracerebral hemorrhage) (HCC) 06/22/2014  . Essential hypertension 05/13/2012   Past Medical History:  Past Medical History:  Diagnosis Date  . Diabetes (HCC)   . Diabetes mellitus without complication (HCC)   . Diastolic dysfunction   . Hypertension   . Immune deficiency disorder (HCC)   . Menometrorrhagia 04/01/2016  . Stroke [facility]    Past  Surgical History:  Past Surgical History:  Procedure Laterality Date  . NO PAST SURGERIES     HPI:  Stephanie Lloyd a 47 y.o.femalewith medical history significantforhemorrhagic and ischemic stroke, DM2, HTN, HLD,who presented to the ED with complaints of worsening facial droop, chokingepisodes,slurred speech,decreased strengthinextremities with falls of 3 days duration. Patient has deficits from prior strokes but spouse at bedside states that his symptoms are worse. Patientand spouse moved in with her father, sheambulates independently at baseline,without use of walker or cane,this has not changed.Spouse reports patient has been falling,and her gait is abnormal.  Patient denies fever or chills,no cough or shortness of breath,vomiting diarrhea no abdominal pain,no dysuria or frequency,no myalgias or rhinorrhea, nosore throat.  MRI is showing 7mm acute ischemice infarct in the left periventricular matter as well as a small infarct in the deep white matter in the anterior right frontal lobe.     Assessment / Plan / Recommendation Clinical Impression  The patient was seen for a speech/language screening in setting for admission due to new CVA.  The patient does have a history of prior CVA's with dysarthria that was treated in the outpatient setting in 2018.  The patient reported that her speech is almost back to baseline, howver, she dies note a decrease in her vocal volume.  Decreased breath support was seen as her maximum phonation time was only 10 seconds.  Despite this her Intelligiblity is only mildly impacted.  The patient completed the [place] Aphasia Screening scoring 98/100 (i.e. Expressive Index Score 50/50, Receptive Index Score 48/50) suggesting functional receptive/expressive language skills.  The patient lives with her husband and disabled father with limited responsibilities.  Given all this ST  follow up is not recommended during her acute stay.  The patient may  want to consisder ST follow up as an outpatient for the mild changes to the quality of her speech.  Please reconsult if we can be of further assistance.          SLP Assessment  SLP Visit Diagnosis: Dysarthria and anarthria (R47.1)    Follow Up Recommendations  Outpatient SLP;Home health SLP    Frequency and Duration min 1 x/week         SLP Evaluation       Comprehension  Auditory Comprehension Overall Auditory Comprehension: Appears within functional limits for tasks assessed Yes/No Questions: Within Functional Limits Commands: Within Functional Limits Conversation: Simple Reading Comprehension Reading Status: Within funtional limits    Expression Expression Primary Mode of Expression: Verbal Verbal Expression Overall Verbal Expression: Appears within functional limits for tasks assessed Initiation: No impairment Automatic Speech: Name;Social Response;Counting;Day of week Level of Generative/Spontaneous Verbalization: Conversation Repetition: No impairment Naming: No impairment Pragmatics: No impairment Non-Verbal Means of Communication: Not applicable Written Expression Dominant Hand: Right Written Expression: Within Functional Limits   Oral / Motor  Oral Motor/Sensory Function Overall Oral Motor/Sensory Function: Mild impairment Facial ROM: Reduced left;Suspected CN VII (facial) dysfunction Facial Symmetry: Abnormal symmetry left;Suspected CN VII (facial) dysfunction Facial Strength: Reduced left;Suspected CN VII (facial) dysfunction Lingual ROM: Within Functional Limits Lingual Symmetry: Within Functional Limits Lingual Strength: Reduced Mandible: Within Functional Limits Motor Speech Overall Motor Speech: Impaired Respiration: Impaired Level of Impairment: Sentence Phonation: Low vocal intensity Resonance: Within functional limits Articulation: Within functional limitis Intelligibility: Intelligibility reduced Word: 75-100% accurate Phrase: 75-100%  accurate Sentence: 75-100% accurate Conversation: 75-100% accurate Motor Planning: Witnin functional limits Motor Speech Errors: Not applicable   GO                   Dimas AguasMelissa Hasheem Voland, MA, CCC-SLP Acute Rehab SLP [phone]  Fleet ContrasMelissa N Ara Mano 04/18/2017, 11:09 AM

## 2017-04-18 NOTE — Care Management Note (Signed)
Case Management Note  Patient Details  Name: Stephanie Lloyd MRN: [id number] Date of Birth: 02/22/1970  Subjective/Objective:    Pt admitted with CVA. She is from home with spouse.                 Action/Plan: Awaiting PT/OT evals. CM following for d/c needs, physician orders.   Expected Discharge Date:                  Expected Discharge Plan:     In-House Referral:     Discharge planning Services     Post Acute Care Choice:    Choice offered to:     DME Arranged:    DME Agency:     HH Arranged:    HH Agency:     Status of Service:  In process, will continue to follow  If discussed at Long Length of Stay Meetings, dates discussed:    Additional Comments:  Kermit Balo, RN 04/18/2017, 10:43 AM

## 2017-04-18 NOTE — Progress Notes (Signed)
Patient arrived from ED around 0230 alert and oriented some facial droop but her speech at this time was good, husband says it comes and goes, will continue to monitor.

## 2017-04-18 NOTE — Evaluation (Signed)
Physical Therapy Evaluation Patient Details Name: Stephanie Lloyd MRN: [id number] DOB: Sep 20, 1970 Today's Date: 04/18/2017   History of Present Illness  Stephanie Lloyd is a 47yo black female who comes to [facility] on 04/18/17 after 3d intermittent choking episodes, slurred speech, BLE weakness, and facial droop. PMH: CVA, DM2, HTN, HLD. AT baseline patient AMB mostly hosuehodl distances without AD, electric cart at the store, independent in ADL, and help with IADL.   Clinical Impression  Pt admitted with above diagnosis. Pt currently with functional limitations due to the deficits listed below (see "PT Problem List"). Upon entry, the patient is received semirecumbent in bed finishing lunch, no family/caregiver present. The pt is awake and agreeable to participate. No acute distress noted at this time. The pt is alert and oriented x3, pleasant, conversational, and following simple and multi-step commands consistently. Speech is mildly dysarthric, moderately bradyphasic. Light touch sensation intact. Functional mobility assessment demonstrates moderate strength impairment in BLE, the pt now requiring min-assist physical assistance for transfers, bed mobility and AMB performed at supervision level, whereas the patient performed these at a higher level of independence PTA. Subjectively pt reports moderate weakness in legs compared to PTA. Pt also requires a RW for safe AMB this date, whereas she required no A/E for AMB PTA. Empirically, the patient demonstrates increased risk of recurrent falls AEB gait speed <0.45m/s, forward reach <5", and multiple LOB demonstrated throughout session. Pt will benefit from skilled PT intervention to increase independence and safety with basic mobility in preparation for discharge to the venue listed below.       Follow Up Recommendations Outpatient PT(prefers/has been to previously: [facility] on [address])    Equipment Recommendations  None recommended by PT     Recommendations for Other Services       Precautions / Restrictions Precautions Precautions: None Restrictions Weight Bearing Restrictions: No      Mobility  Bed Mobility Overal bed mobility: Modified Independent             General bed mobility comments: requires additional time   Transfers Overall transfer level: Needs assistance Equipment used: Rolling walker (2 wheeled) Transfers: Sit to/from Stand Sit to Stand: Min assist         General transfer comment: minA with RW from standard heigh surface' maximal effort from elevated surface at minGuard assist.  Ambulation/Gait Ambulation/Gait assistance: Supervision Ambulation Distance (Feet): 450 Feet Assistive device: Rolling walker (2 wheeled) Gait Pattern/deviations: WFL(Within Functional Limits) Gait velocity: 0.28m/s  Gait velocity interpretation: <1.8 ft/sec, indicative of risk for recurrent falls General Gait Details: decreased heel strike on Left (flat foot strike) mild RLE vaulting   Stairs            Wheelchair Mobility    Modified Rankin (Stroke Patients Only)       Balance Overall balance assessment: Modified Independent;History of Falls(some fallign at hoem PTA upon onset of new CVA Sx)                                           Pertinent Vitals/Pain Pain Assessment: No/denies pain    Home Living Family/patient expects to be discharged to:: Private residence Living Arrangements: Spouse/significant other(husband and father) Available Help at Discharge: Family Type of Home: Mobile home Home Access: Stairs to enter;Ramped entrance     Home Layout: One level Home Equipment: Environmental consultant - 2 wheels;Shower seat  Prior Function Level of Independence: Needs assistance   Gait / Transfers Assistance Needed: mild baseline dysarthria, slow AMB with limited tolerance.   ADL's / Homemaking Assistance Needed: independent with ADL, but needs assistance with IADL          Hand Dominance   Dominant Hand: Right    Extremity/Trunk Assessment        Lower Extremity Assessment Lower Extremity Assessment: (sensation in tact; reports subjective weakness throughout BLE)    Cervical / Trunk Assessment Cervical / Trunk Assessment: Normal  Communication   Communication: Expressive difficulties(bradyphasia, mild dysarthria)  Cognition Arousal/Alertness: Awake/alert Behavior During Therapy: WFL for tasks assessed/performed Overall Cognitive Status: Within Functional Limits for tasks assessed                                        General Comments      Exercises     Assessment/Plan    PT Assessment Patient needs continued PT services  PT Problem List Decreased mobility;Decreased activity tolerance;Decreased range of motion;Decreased strength       PT Treatment Interventions Balance training;Functional mobility training;Therapeutic activities;Therapeutic exercise    PT Goals (Current goals can be found in the Care Plan section)  Acute Rehab PT Goals Patient Stated Goal: regain AMB function as PLOF  PT Goal Formulation: With patient Time For Goal Achievement: 04/25/17 Potential to Achieve Goals: Fair    Frequency Min 4X/week   Barriers to discharge        Co-evaluation               AM-PAC PT "6 Clicks" Daily Activity  Outcome Measure Difficulty turning over in bed (including adjusting bedclothes, sheets and blankets)?: A Little Difficulty moving from lying on back to sitting on the side of the bed? : A Little Difficulty sitting down on and standing up from a chair with arms (e.g., wheelchair, bedside commode, etc,.)?: Unable Help needed moving to and from a bed to chair (including a wheelchair)?: A Lot Help needed walking in hospital room?: A Little Help needed climbing 3-5 steps with a railing? : A Lot 6 Click Score: 14    End of Session Equipment Utilized During Treatment: Gait belt Activity Tolerance:  Patient tolerated treatment well;Patient limited by fatigue Patient left: in chair;with nursing/sitter in room;with call bell/phone within reach Nurse Communication: Mobility status PT Visit Diagnosis: Difficulty in walking, not elsewhere classified (R26.2);Other symptoms and signs involving the nervous system (R29.898);Muscle weakness (generalized) (M62.81)    Time: [id number] PT Time Calculation (min) (ACUTE ONLY): 30 min   Charges:   PT Evaluation $PT Eval Moderate Complexity: 1 Mod PT Treatments $Therapeutic Activity: 8-22 mins   PT G Codes:       1:17 PM, 04/18/17 Rosamaria LintsAllan C Dawson Hollman, PT, DPT Physical Therapist - [place] [phone] (Pager)  [phone] (Office)    Dellie Piasecki C 04/18/2017, 1:14 PM

## 2017-04-19 ENCOUNTER — Observation Stay (HOSPITAL_COMMUNITY): Payer: Medicaid Other

## 2017-04-19 ENCOUNTER — Observation Stay (HOSPITAL_COMMUNITY): Payer: Self-pay

## 2017-04-19 ENCOUNTER — Observation Stay (HOSPITAL_BASED_OUTPATIENT_CLINIC_OR_DEPARTMENT_OTHER): Payer: Medicaid Other

## 2017-04-19 ENCOUNTER — Encounter (HOSPITAL_COMMUNITY): Payer: Self-pay | Admitting: Radiology

## 2017-04-19 DIAGNOSIS — I63522 Cerebral infarction due to unspecified occlusion or stenosis of left anterior cerebral artery: Secondary | ICD-10-CM | POA: Diagnosis not present

## 2017-04-19 DIAGNOSIS — N183 Chronic kidney disease, stage 3 (moderate): Secondary | ICD-10-CM | POA: Diagnosis not present

## 2017-04-19 DIAGNOSIS — R29810 Facial weakness: Secondary | ICD-10-CM | POA: Diagnosis present

## 2017-04-19 DIAGNOSIS — G4733 Obstructive sleep apnea (adult) (pediatric): Secondary | ICD-10-CM | POA: Diagnosis present

## 2017-04-19 DIAGNOSIS — Z7982 Long term (current) use of aspirin: Secondary | ICD-10-CM | POA: Diagnosis not present

## 2017-04-19 DIAGNOSIS — I6930 Unspecified sequelae of cerebral infarction: Secondary | ICD-10-CM | POA: Diagnosis not present

## 2017-04-19 DIAGNOSIS — I6789 Other cerebrovascular disease: Secondary | ICD-10-CM | POA: Diagnosis not present

## 2017-04-19 DIAGNOSIS — Z79899 Other long term (current) drug therapy: Secondary | ICD-10-CM | POA: Diagnosis not present

## 2017-04-19 DIAGNOSIS — I129 Hypertensive chronic kidney disease with stage 1 through stage 4 chronic kidney disease, or unspecified chronic kidney disease: Secondary | ICD-10-CM | POA: Diagnosis present

## 2017-04-19 DIAGNOSIS — R29898 Other symptoms and signs involving the musculoskeletal system: Secondary | ICD-10-CM | POA: Diagnosis present

## 2017-04-19 DIAGNOSIS — R402413 Glasgow coma scale score 13-15, at hospital admission: Secondary | ICD-10-CM | POA: Diagnosis present

## 2017-04-19 DIAGNOSIS — D849 Immunodeficiency, unspecified: Secondary | ICD-10-CM | POA: Diagnosis present

## 2017-04-19 DIAGNOSIS — I639 Cerebral infarction, unspecified: Secondary | ICD-10-CM | POA: Diagnosis not present

## 2017-04-19 DIAGNOSIS — R296 Repeated falls: Secondary | ICD-10-CM | POA: Diagnosis present

## 2017-04-19 DIAGNOSIS — R1310 Dysphagia, unspecified: Secondary | ICD-10-CM | POA: Diagnosis present

## 2017-04-19 DIAGNOSIS — R269 Unspecified abnormalities of gait and mobility: Secondary | ICD-10-CM | POA: Diagnosis present

## 2017-04-19 DIAGNOSIS — I10 Essential (primary) hypertension: Secondary | ICD-10-CM | POA: Diagnosis not present

## 2017-04-19 DIAGNOSIS — R4781 Slurred speech: Secondary | ICD-10-CM | POA: Diagnosis present

## 2017-04-19 DIAGNOSIS — Z888 Allergy status to other drugs, medicaments and biological substances status: Secondary | ICD-10-CM | POA: Diagnosis not present

## 2017-04-19 DIAGNOSIS — E1159 Type 2 diabetes mellitus with other circulatory complications: Secondary | ICD-10-CM | POA: Diagnosis not present

## 2017-04-19 DIAGNOSIS — E1151 Type 2 diabetes mellitus with diabetic peripheral angiopathy without gangrene: Secondary | ICD-10-CM | POA: Diagnosis present

## 2017-04-19 DIAGNOSIS — R471 Dysarthria and anarthria: Secondary | ICD-10-CM | POA: Diagnosis present

## 2017-04-19 DIAGNOSIS — E1122 Type 2 diabetes mellitus with diabetic chronic kidney disease: Secondary | ICD-10-CM | POA: Diagnosis present

## 2017-04-19 DIAGNOSIS — Z8679 Personal history of other diseases of the circulatory system: Secondary | ICD-10-CM | POA: Diagnosis not present

## 2017-04-19 DIAGNOSIS — R2990 Unspecified symptoms and signs involving the nervous system: Secondary | ICD-10-CM | POA: Diagnosis not present

## 2017-04-19 DIAGNOSIS — E1142 Type 2 diabetes mellitus with diabetic polyneuropathy: Secondary | ICD-10-CM | POA: Diagnosis present

## 2017-04-19 DIAGNOSIS — Z794 Long term (current) use of insulin: Secondary | ICD-10-CM | POA: Diagnosis not present

## 2017-04-19 DIAGNOSIS — R29703 NIHSS score 3: Secondary | ICD-10-CM | POA: Diagnosis present

## 2017-04-19 LAB — GLUCOSE, CAPILLARY
GLUCOSE-CAPILLARY: 107 mg/dL — AB (ref 65–99)
Glucose-Capillary: 112 mg/dL — ABNORMAL HIGH (ref 65–99)
Glucose-Capillary: 150 mg/dL — ABNORMAL HIGH (ref 65–99)
Glucose-Capillary: 100 mg/dL — ABNORMAL HIGH (ref 65–99)

## 2017-04-19 LAB — CBC
HCT: 39.3 % (ref 36.0–46.0)
HEMOGLOBIN: 12.5 g/dL (ref 12.0–15.0)
MCH: 27.9 pg (ref 26.0–34.0)
MCHC: 31.8 g/dL (ref 30.0–36.0)
MCV: 87.7 fL (ref 78.0–100.0)
Platelets: 186 10*3/uL (ref 150–400)
RBC: 4.48 MIL/uL (ref 3.87–5.11)
RDW: 14 % (ref 11.5–15.5)
WBC: 7.4 10*3/uL (ref 4.0–10.5)

## 2017-04-19 LAB — BASIC METABOLIC PANEL
Anion gap: 11 (ref 5–15)
BUN: 18 mg/dL (ref 6–20)
CALCIUM: 8.8 mg/dL — AB (ref 8.9–10.3)
CHLORIDE: 103 mmol/L (ref 101–111)
CO2: 22 mmol/L (ref 22–32)
CREATININE: 1.28 mg/dL — AB (ref 0.44–1.00)
GFR calc Af Amer: 56 mL/min — ABNORMAL LOW (ref >60)
GFR calc non Af Amer: 49 mL/min — ABNORMAL LOW (ref >60)
Glucose, Bld: 118 mg/dL — ABNORMAL HIGH (ref 65–99)
Potassium: 3.9 mmol/L (ref 3.5–5.1)
Sodium: 136 mmol/L (ref 135–145)

## 2017-04-19 LAB — ECHOCARDIOGRAM COMPLETE
HEIGHTINCHES: 70 in
WEIGHTICAEL: 3153.46 [oz_av]

## 2017-04-19 MED ORDER — IOPAMIDOL (ISOVUE-370) INJECTION 76%
INTRAVENOUS | Status: AC
  Administered 2017-04-19: 50 mL
  Filled 2017-04-19: qty 50

## 2017-04-19 MED ORDER — TRAZODONE HCL 50 MG PO TABS
50.0000 mg | ORAL_TABLET | Freq: Every evening | ORAL | Status: DC | PRN
  Administered 2017-04-19: 50 mg via ORAL
  Filled 2017-04-19: qty 1

## 2017-04-19 MED ORDER — ATORVASTATIN CALCIUM 80 MG PO TABS
80.0000 mg | ORAL_TABLET | Freq: Every day | ORAL | Status: DC
  Administered 2017-04-19: 80 mg via ORAL
  Filled 2017-04-19: qty 1

## 2017-04-19 NOTE — Progress Notes (Signed)
  PROGRESS NOTE  Manfred ShirtsMarva N Lear ZOX:[id number] DOB: 07/08/1970 DOA: 04/17/2017 PCP: Donato SchultzLowne Chase, [name], DO  Brief Narrative: 47 year old woman PMH multiple strokes, diabetes mellitus, presented with dysphagia, dysarthria.  Admitted for further evaluation of neurologic symptoms.  Assessment/Plan Acute ischemic infarct left periventricular white matter with dysphagia, dysarthria, facial droop.  No hemorrhage.   LDL 130.  CT had no acute abnormalities. - Further workup as directed by stroke service.  Continue current regimen. - Regular diet with thin liquids per speech therapy.   - Outpatient speech-language pathology. - Outpatient PT with, neurology OP PT on [address].  Outpatient OT.  PMH CVA x4  Diabetes mellitus type 2, polyneuropathy, chronic kidney disease - Blood sugars stable  Chronic kidney disease stage III, appears close to baseline.  Essential hypertension --Permissive hypertension  DVT prophylaxis: Enoxaparin Code Status: Full Family Communication: None Disposition Plan: Home   Penny Piarlando Latesha Chesney, MD  [facility] Direct contact: [phone] --Via amion app OR  --[email or web address]; password [email or web address]  7PM-7AM contact night coverage as above 04/19/2017, 3:36 PM  LOS: 0 days   Consultants:  Neurology  Procedures:    Antimicrobials:    Interval history/Subjective: No new complaints reported.  Objective: Vitals:  Vitals:   04/19/17 0825 04/19/17 1228  BP: 121/76 102/64  Pulse: 63 67  Resp: 14 13  Temp: (!) 97.5 F (36.4 C) 97.8 F (36.6 C)  SpO2: 99% 99%    Exam:  Constitutional:  . Appears calm and comfortable, in nad. Eyes:  . pupils and irises appear normal ENMT:  . grossly normal hearing  Respiratory:  . CTA bilaterally but breath sounds diminished, no w/r/r.  . Respiratory effort normal Cardiovascular:  . RRR, no m/r/g . No LE extremity edema   Musculoskeletal:  . RUE, LUE, RLE, LLE   . Moves all extremities to  command Neurologic:  . Left facial weakness noted Psychiatric:  . Mental status o Mood, affect appropriate   I have personally reviewed the following:   Labs:  Basic metabolic panel unremarkable, creatinine 1.50   LDL 130  CBC unremarkable  Imaging studies:  CT head, MRI brain noted  Medical tests:  EKG sinus rhythm, no acute changes  Scheduled Meds: . aspirin  325 mg Oral Daily  . atorvastatin  40 mg Oral q1800  . enoxaparin (LOVENOX) injection  40 mg Subcutaneous Q24H  . fluticasone  2 spray Each Nare Daily  . insulin aspart  0-9 Units Subcutaneous TID WC  . linagliptin  5 mg Oral Daily  . lisinopril  40 mg Oral Daily  . pioglitazone  30 mg Oral Daily  . prednisoLONE acetate  1 drop Both Eyes BID   Continuous Infusions:   Principal Problem:   Ischemic stroke [facility](HCC) Active Problems:   Essential hypertension   Type 2 diabetes mellitus with other circulatory complications (HCC)   Facial droop   CKD (chronic kidney disease), stage III (HCC)   LOS: 0 days

## 2017-04-19 NOTE — Progress Notes (Signed)
Physical Therapy Treatment Patient Details Name: Stephanie Lloyd MRN: [id number] DOB: 04/25/1970 Today's Date: 04/19/2017    History of Present Illness Stephanie Lloyd is a 47 y/o female who comes to [facility] on 04/18/17 after 3 day of intermittent choking episodes, slurred speech, BLE weakness, and facial droop. MRI revealed 7 mm acute ischemic infarct involving the left periventricular white matter. PMH: CVA, DM2, HTN, HLD. AT baseline patient AMB mostly hosuehodl distances without AD, electric cart at the store, independent in ADL, and help with IADL.     PT Comments    Pt seen for mobility progression. Pt tolerated gait training this session with RW. She continues to require some physical assistance for transfers at this time secondary to weakness. Pt would continue to benefit from skilled physical therapy services at this time while admitted and after d/c to address the below listed limitations in order to improve overall safety and independence with functional mobility.   Follow Up Recommendations  Outpatient PT;Other (comment)(NEURO OP)     Equipment Recommendations  None recommended by PT    Recommendations for Other Services       Precautions / Restrictions Precautions Precautions: Fall Restrictions Weight Bearing Restrictions: No    Mobility  Bed Mobility Overal bed mobility: Modified Independent                Transfers Overall transfer level: Needs assistance Equipment used: Rolling walker (2 wheeled) Transfers: Sit to/from Stand Sit to Stand: Min assist         General transfer comment: increased time and effort, multiple attempts required, use of momentum, min A to power into standing   Ambulation/Gait Ambulation/Gait assistance: Min guard Ambulation Distance (Feet): 500 Feet Assistive device: Rolling walker (2 wheeled) Gait Pattern/deviations: Decreased stride length Gait velocity: decreased Gait velocity interpretation: Below normal speed for  age/gender General Gait Details: no instability or LOB with use of RW, min guard for safety   Stairs            Wheelchair Mobility    Modified Rankin (Stroke Patients Only)       Balance Overall balance assessment: Needs assistance Sitting-balance support: Feet supported Sitting balance-Leahy Scale: Good     Standing balance support: During functional activity Standing balance-Leahy Scale: Fair                              Cognition Arousal/Alertness: Awake/alert Behavior During Therapy: WFL for tasks assessed/performed Overall Cognitive Status: Within Functional Limits for tasks assessed                                        Exercises      General Comments        Pertinent Vitals/Pain Pain Assessment: No/denies pain    Home Living                      Prior Function            PT Goals (current goals can now be found in the care plan section) Acute Rehab PT Goals Patient Stated Goal: to return home  PT Goal Formulation: With patient Time For Goal Achievement: 04/25/17 Potential to Achieve Goals: Good Progress towards PT goals: Progressing toward goals    Frequency    Min 4X/week      PT Plan Current plan  remains appropriate    Co-evaluation              AM-PAC PT "6 Clicks" Daily Activity  Outcome Measure  Difficulty turning over in bed (including adjusting bedclothes, sheets and blankets)?: None Difficulty moving from lying on back to sitting on the side of the bed? : None Difficulty sitting down on and standing up from a chair with arms (e.g., wheelchair, bedside commode, etc,.)?: Unable Help needed moving to and from a bed to chair (including a wheelchair)?: None Help needed walking in hospital room?: None Help needed climbing 3-5 steps with a railing? : A Little 6 Click Score: 20    End of Session Equipment Utilized During Treatment: Gait belt Activity Tolerance: Patient tolerated  treatment well Patient left: in bed;with call bell/phone within reach;with bed alarm set;with family/visitor present;Other (comment)(sitting EOB to eat dinner) Nurse Communication: Mobility status PT Visit Diagnosis: Other symptoms and signs involving the nervous system (R29.898);Muscle weakness (generalized) (M62.81)     Time: [id number] PT Time Calculation (min) (ACUTE ONLY): 18 min  Charges:  $Gait Training: 8-22 mins                    G Codes:       ArmingtonJennifer Alexiana Laverdure, [place]PT, [place]PT [phone]    Alessandra BevelsJennifer M Manjinder Breau 04/19/2017, 5:36 PM

## 2017-04-19 NOTE — Progress Notes (Signed)
  Echocardiogram 2D Echocardiogram has been performed.  Leta JunglingCooper, Marvelous Woolford M 04/19/2017, 10:48 AM

## 2017-04-19 NOTE — Progress Notes (Signed)
STROKE TEAM PROGRESS NOTE   SUBJECTIVE (INTERVAL HISTORY) Her husband is at the bedside.  Pt speech difficulty much improve, near baseline. Discussed with her about the 3rd ischemic stroke in 3 years, and she had small ICH 4 years ago and extensive CMBs on MRI. Family chose not to upgrade ASA to DAPT at this time. Will ask her continue stroke risk factor modification.    OBJECTIVE Temp:  [97.5 F (36.4 C)-98.1 F (36.7 C)] 97.8 F (36.6 C) (03/23 1228) Pulse Rate:  [63-67] 67 (03/23 1228) Cardiac Rhythm: Normal sinus rhythm (03/23 0700) Resp:  [13-14] 13 (03/23 1228) BP: (102-155)/(64-86) 102/64 (03/23 1228) SpO2:  [99 %] 99 % (03/23 1228)  CBC:  Recent Labs  Lab 04/17/17 1556 04/17/17 1621 04/19/17 0632  WBC 7.8  --  7.4  NEUTROABS 5.4  --   --   HGB 12.4 13.3 12.5  HCT 39.0 39.0 39.3  MCV 88.2  --  87.7  PLT 194  --  186    Basic Metabolic Panel:  Recent Labs  Lab 04/17/17 1556 04/17/17 1621 04/19/17 0632  NA 139 141 136  K 4.4 4.4 3.9  CL 105 103 103  CO2 27  --  22  GLUCOSE 127* 123* 118*  BUN 23* 26* 18  CREATININE 1.39* 1.50* 1.28*  CALCIUM 9.1  --  8.8*    Lipid Panel:     Component Value Date/Time   CHOL 219 (H) 04/18/2017 0254   TRIG 51 04/18/2017 0254   HDL 79 04/18/2017 0254   CHOLHDL 2.8 04/18/2017 0254   VLDL 10 04/18/2017 0254   LDLCALC 130 (H) 04/18/2017 0254   HgbA1c:  Lab Results  Component Value Date   HGBA1C 7.0 03/31/2017   Urine Drug Screen:     Component Value Date/Time   LABOPIA NONE DETECTED 10/28/2015 1112   COCAINSCRNUR NONE DETECTED 10/28/2015 1112   LABBENZ NONE DETECTED 10/28/2015 1112   AMPHETMU NONE DETECTED 10/28/2015 1112   THCU NONE DETECTED 10/28/2015 1112   LABBARB NONE DETECTED 10/28/2015 1112    Alcohol Level No results found for: ETH  IMAGING I have personally reviewed the radiological images below and agree with the radiology interpretations.  Ct Angio Head W Or Wo Contrast Ct Angio Neck W Or Wo  Contrast 04/19/2017 IMPRESSION:  1. No large vessel occlusion.  2. Minimal atherosclerotic changes at the carotid bifurcations bilaterally without significant stenosis.  3. Mild-to-moderate cavernous right ICA narrowing.  4. Distal small vessel disease without a significant proximal stenosis, aneurysm, or branch vessel occlusion within the circle-of-Willis.   Ct Head Wo Contrast 04/17/2017 IMPRESSION:  Chronic moderate small vessel ischemic disease of periventricular white matter. No acute intracranial abnormality.   Mr Maxine Glenn Head Wo Contrast 04/18/2017 IMPRESSION:  Moderate stenosis of the distal right internal carotid artery, predominantly the clinoid and ophthalmic segments. Otherwise normal intracranial MRA.   Mr Brain Wo Contrast 04/18/2017 IMPRESSION:  1. 7 mm acute ischemic infarct involving the left periventricular white matter. Additional faint patchy small vessel infarct involving the deep white matter of the anterior right frontal lobe.  No associated hemorrhage.  2. Advanced cerebral atrophy with chronic small vessel ischemic disease for age, with multiple remote lacunar infarcts involving the cerebral hemispheric white matter, thalami, and brainstem.  3. Innumerable chronic micro hemorrhages throughout the brain, likely related to chronic ischemia and/or hypertension.   Transthoracic Echocardiogram  04/19/2017 Study Conclusions - Left ventricle: The cavity size was normal. Wall thickness was   increased in  a pattern of mild LVH. Systolic function was normal.   The estimated ejection fraction was in the range of 60% to 65%.   Wall motion was normal; there were no regional wall motion   abnormalities. Left ventricular diastolic function parameters   were normal. - Atrial septum: A patent foramen ovale cannot be excluded.    PHYSICAL EXAM Vitals:   04/19/17 0008 04/19/17 0427 04/19/17 0825 04/19/17 1228  BP: (!) 155/85  121/76 102/64  Pulse:   63 67  Resp:   14 13   Temp: 97.7 F (36.5 C) 97.7 F (36.5 C) (!) 97.5 F (36.4 C) 97.8 F (36.6 C)  TempSrc: Oral Oral Oral Oral  SpO2:   99% 99%  Weight:      Height:        Temp:  [97.5 F (36.4 C)-97.9 F (36.6 C)] 97.7 F (36.5 C) (03/23 1628) Pulse Rate:  [62-67] 62 (03/23 1628) Resp:  [13-14] 13 (03/23 1628) BP: (102-155)/(64-86) 122/74 (03/23 1628) SpO2:  [99 %-100 %] 100 % (03/23 1628)  General - Well nourished, well developed, in no apparent distress.  Ophthalmologic - Fundi not visualized due to noncooperation.  Cardiovascular - Regular rate and rhythm.  Mental Status -  Level of arousal and orientation to time, place, and person were intact. Language including expression, naming, repetition, comprehension was assessed and found intact.  Mild dysarthria Fund of Knowledge was assessed and was intact.  Cranial Nerves II - XII - II - Visual field intact OU. III, IV, VI - Extraocular movements intact. V - Facial sensation intact bilaterally. VII - Facial movement intact bilaterally. VIII - Hearing & vestibular intact bilaterally. X - Palate elevates symmetrically, mild dysarthria. XI - Chin turning & shoulder shrug intact bilaterally. XII - Tongue protrusion intact.  Motor Strength - The patient's strength was normal in all extremities and pronator drift was absent.  Bulk was normal and fasciculations were absent.   Motor Tone - Muscle tone was assessed at the neck and appendages and was normal.  Reflexes - The patient's reflexes were 1+ in all extremities and she had no pathological reflexes.  Sensory - Light touch, temperature/pinprick were assessed and were symmetrical.    Coordination - The patient had normal movements in the hands and feet with no ataxia or dysmetria.  Tremor was absent.  Gait and Station - deferred   ASSESSMENT/PLAN Ms. Manfred ShirtsMarva N Forti is a 47 y.o. female with history of previous pontine stroke, immune deficiency disorder, hypertension, diastolic  dysfunction, and diabetes mellitus presenting with dysphagia, dysarthria and facial weakness.   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 No acute abnormality.  MRI  L periventricular white matter infarct. Faint/patchy anterior R frontal infarct. Multiple old lacunar white matter, thalamic & brainstem infarcts. 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  CTA H&N - Mild cavernous right ICA narrowing.   2D Echo - EF 60 - 65%  LDL 130  HgbA1c 7.0  Lovenox 40 mg sq daily for VTE prophylaxis  Fall precautions  Aspiration precautions  Diet Heart Room service appropriate? Yes; Fluid consistency: Thin  aspirin 325 mg daily prior to admission, now on aspirin 325  mg daily. Do not recommend plavix or AC d/t innumerable micro hemorrhages, hx R ICH.  Discussed with patient and her husband, they are in agreement.  Therapy recommendations:  OP SLP, OP PT  Disposition:  Return home w/ husband  Hx of stroke/ICH  01/2016 -left pontine, right insular small infarcts, EF 60-65%, LDL 99, aspirin 81 changed to 325, continue Lipitor 80  09/2015 -left pontine/midbrain infarct secondary to small vessel disease- genetic testing recommended but not able to do due to insurance issue -CTA head and neck left P2 high-grade stenosis, EF 65-70%, LDL 102, A1c 8.5 - on aspirin and pravastatin  05/2014 -right small thalamic ICH likely due to hypertensionand DM -LDL 140 and A1c 9.0 -MRI showed extensive CMBs and white matter changes.  MRA negative  Hypertension  Variable but stable  Permissive hypertension (OK if < 180/105) but gradually normalize in 5-7  days  Long-term BP goal normotensive  Hyperlipidemia  Home meds:  None listed. On lipitor 80 in the past  LDL 130 goal <70  Now no lipitor 80   Continue statin at discharge  Diabetes type II  HgbA1c 7.0, goal < 7.0  Controlled  SSI  CBG monitoring  Other Stroke Risk Factors  Family hx stroke (father, brother, paternal uncle)  Diastolic dysfunction  Hospital day # 0  Neurology will sign off. Please call with questions. Pt will follow up with Dr. Roda Shutters at [facility] in about 4 weeks. Thanks for the consult.  Marvel Plan, MD PhD Stroke Neurology 04/19/2017 5:12 PM  To contact Stroke Continuity provider, please refer to [email or web address]. After hours, contact General Neurology

## 2017-04-20 LAB — GLUCOSE, CAPILLARY
Glucose-Capillary: 107 mg/dL — ABNORMAL HIGH (ref 65–99)
Glucose-Capillary: 142 mg/dL — ABNORMAL HIGH (ref 65–99)

## 2017-04-20 MED ORDER — ATORVASTATIN CALCIUM 80 MG PO TABS: 80.0000 mg | ORAL_TABLET | Freq: Every day | ORAL | 0 refills | Status: DC

## 2017-04-20 NOTE — Discharge Summary (Addendum)
Physician Discharge Summary  Stephanie Lloyd GXQ:[id number] DOB: [birth year] DOA: 04/17/2017  PCP: Ann Held, DO  Admit date: 04/17/2017 Discharge date: 04/20/2017  Time spent: > 35 minutes  Recommendations for Outpatient Follow-up:  1. Please follow up with neurology   Discharge Diagnoses:  Principal Problem:   Ischemic stroke [facility]) Active Problems:   Essential hypertension   Type 2 diabetes mellitus with other circulatory complications (HCC)   Facial droop   CKD (chronic kidney disease), stage III [facility])   Discharge Condition: stable  Diet recommendation: Heart healthy  Filed Weights   04/18/17 0045 04/18/17 0228  Weight: 93 kg (205 lb) 89.4 kg (197 lb 1.5 oz)    History of present illness:  47 year old woman PMH multiple strokes, diabetes mellitus, presented with dysphagia, dysarthria.  Admitted for further evaluation of neurologic symptoms and diagnosed with acute ischemic infarct.  Hospital Course:   Acute ischemic infarct left periventricular white matter with dysphagia, dysarthria, facial droop.  No hemorrhage.   LDL 130.   - Neurology evaluated and recommended the following:  Stroke:Acute L periventricular white matter and subacute anterior R frontal punctate infarcts - most likely small bowel disease given hx of SVD, pontine infarcts & thalamic small ICH as well as severe CMBs  Resultant mild dysarthria  CTheadsmall vessel disease.No acute abnormality.  MRIL periventricular white matter infarct. Faint/patchy anterior R frontal infarct. Multiple old lacunar white matter, thalamic & brainstem infarcts. Innumerable chronic micro hemorrhages  MRA- Moderate stenosis of the distal right internal carotid artery, predominantly the clinoid and ophthalmic segments.  CTA H&N - Mild cavernous right ICA narrowing.   2D Echo- EF 60 - 65%  LDL130  HgbA1c7.0  Lovenox 40 mg sq dailyfor VTE prophylaxis  Fall precautions  Aspiration  precautions  Diet Heart Room service appropriate? Yes; Fluid consistency: Thin  aspirin 325 mg dailyprior to admission, now on aspirin 325 mg daily. Do not recommend plavix or AC d/t innumerable micro hemorrhages, hx R ICH.  Discussed with patient and her husband, they are in agreement.  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  MRA negative  Diabetes mellitus type 2, polyneuropathy, chronic kidney disease - Will continue prior to admission medication regimen.   Chronic kidney disease stage III, appears close to baseline.  Essential hypertension --continue rx listed below  Procedures:  None  Consultations:  Neurology  Discharge Exam: Vitals:   04/20/17 0428 04/20/17 0828  BP: 107/70 120/81  Pulse: 69 60  Resp: 17 (!) 27  Temp:  97.7 F (36.5 C)  SpO2: 97% 100%    General: Pt in nad, alert and awake Cardiovascular: rrr, no rubs Respiratory: no increased wob, no wheezes  Discharge Instructions   Discharge Instructions    Ambulatory referral to Neurology   Complete by:  As directed    Pt will follow up with Dr. Erlinda Hong at [facility] in about 4 weeks. Thanks.   Ambulatory referral to Occupational Therapy   Complete by:  As directed    Pt requested [facility] area   Ambulatory referral to Physical Therapy   Complete by:  As directed    Ambulatory referral to Speech Therapy   Complete by:  As directed    MD specified  Neuro rehab for SLP   Call MD for:  extreme fatigue   Complete by:  As directed    Call MD for:  temperature >100.4   Complete by:  As directed    Diet - low sodium heart healthy   Complete by:  As directed    Increase activity slowly   Complete by:  As directed      Allergies as of 04/20/2017      Reactions   Metformin And Related Diarrhea      Medication List    STOP taking these medications   amLODipine 10 MG tablet Commonly known as:  NORVASC   docusate sodium 100 MG capsule Commonly known as:  COLACE   escitalopram 10 MG tablet Commonly known as:  LEXAPRO   polyethylene glycol packet Commonly known as:  MIRALAX / GLYCOLAX     TAKE these medications   ACCU-CHEK AVIVA PLUS w/Device Kit Use as directed twice a day.  Dx Code: E11.9   ACCU-CHEK SOFTCLIX LANCETS lancets Use as directed twice a day.  Dx Code: E11.9   aspirin 325 MG EC tablet Take 1 tablet (325 mg total) by mouth daily.   atorvastatin 80 MG tablet Commonly known as:  LIPITOR Take 1 tablet (80 mg total) by mouth daily at 6 PM.   fluticasone 50 MCG/ACT nasal spray Commonly known as:  FLONASE Place 2 sprays into both nostrils daily.   glucose blood test strip Commonly known as:  ACCU-CHEK AVIVA PLUS Use as directed twice a day.  Dx Code: E11.9   imiquimod 5 % cream Commonly known as:  ALDARA Apply topically 3 (three) times a week. Apply until total clearance or maximum of 16 weeks   Insulin Pen Needle 31G X 5 MM Misc Commonly known as:  B-D UF III MINI PEN NEEDLES To use with insulin   linagliptin 5 MG Tabs tablet Commonly known as:  TRADJENTA Take 1 tablet (5 mg total) by mouth daily.   lisinopril 40 MG tablet Commonly known as:  PRINIVIL,ZESTRIL Take 1 tablet (40 mg total) by mouth daily.   pioglitazone 30 MG tablet Commonly known as:  ACTOS Take 1 tablet (30 mg total) by mouth daily.   prednisoLONE acetate 1 % ophthalmic suspension Commonly known as:  PRED FORTE Place 1 drop  into both eyes 2 (two) times daily.   solifenacin 10 MG tablet Commonly known as:  VESICARE Take 1 tablet (10 mg total) by mouth daily.   Vitamin D (Ergocalciferol) 50000 units Caps capsule Commonly known as:  DRISDOL Take 1 capsule (50,000 Units total) by mouth every 7 (seven) days.      Allergies  Allergen Reactions  . Metformin And Related Diarrhea   Follow-up Information    Rosalin Hawking, MD. Schedule an appointment as soon as possible for a visit in 4 week(s).   Specialty:  Neurology Contact information: [address] [place] [address] [place] Follow up.   Specialty:  Rehabilitation Why:  Occupational therapy will contact you for appointment. Contact information: [place] [facility] Follow up.   Specialty:  Rehabilitation Why:  Physical and speech therapists will contact you for appointment. Contact information: [address] [place] [id number] [place] [phone]           The results of significant diagnostics from this hospitalization (including imaging, microbiology, ancillary and laboratory) are listed below for reference.    Significant Diagnostic Studies: Ct Angio Head W Or Wo Contrast  Result Date: 04/19/2017 CLINICAL DATA:  Left ACA territory infarct.  Right ICA stenosis. EXAM: CT ANGIOGRAPHY HEAD AND NECK TECHNIQUE: Multidetector CT imaging of the head and neck was performed using the standard protocol during bolus administration of intravenous contrast. Multiplanar CT image reconstructions and MIPs were obtained to evaluate the vascular anatomy. Carotid stenosis measurements (when applicable) are obtained utilizing NASCET criteria, using the distal internal carotid diameter as the denominator. CONTRAST:   24m ISOVUE-370 IOPAMIDOL (ISOVUE-370) INJECTION 76% COMPARISON:  MRI brain 04/18/17. CT head without contrast 04/17/2017. FINDINGS: CT HEAD FINDINGS Brain: Confluent periventricular white matter hypoattenuation is present bilaterally. Remote lacunar infarcts are again seen in the basal ganglia bilaterally. Insular ribbon is normal. No acute hemorrhage is present. The acute white matter infarct is not discretely visualized by CT. No significant extra-axial fluid collections are present. Remote lacunar infarcts are again noted in the pons. Cerebellum is unremarkable. Vascular: Atherosclerotic calcifications are present within the cavernous internal carotid arteries bilaterally. There is no hyperdense vessel. Skull: Calvarium is intact. No focal lytic or blastic lesions are present. Sinuses: The paranasal sinuses and mastoid air cells are clear. Orbits: Globes and orbits are within normal limits. Review of the MIP images confirms the above findings CTA NECK FINDINGS Aortic arch: There is a common origin of the left common carotid artery in the innominate artery. Minimal atherosclerotic changes are present at the arch without aneurysm or focal stenosis. Right carotid system: The right common carotid artery is within normal limits. The bifurcation is unremarkable. There is some tortuosity of the distal cervical right ICA without significant stenosis. Left carotid system: The left common carotid artery is within normal limits. Mild atherosclerotic changes are present at the carotid bifurcation without a significant stenosis. The cervical left ICA is normal. Vertebral arteries: The vertebral arteries originate from the subclavian arteries without significant stenosis. There is artifact at the origins due to prominent venous contrast. The left vertebral artery is the dominant vessel. There is no significant stenosis to either vertebral artery in the neck. Skeleton: Vertebral body heights alignment are maintained. No focal  lytic or blastic lesions are present. Other neck: The soft tissues the neck are otherwise unremarkable. There is no significant adenopathy. Thyroid is within normal limits. Salivary glands are unremarkable. Upper chest: The lung apices demonstrate mild dependent atelectasis bilaterally. There is no focal nodule or mass lesion. The thoracic inlet is within normal limits. Review of the MIP images confirms the above findings CTA HEAD FINDINGS Anterior circulation: Atherosclerotic calcifications are present within the cavernous internal carotid arteries bilaterally. Mild-to-moderate narrowing is confirmed within the cavernous right ICA. There is mild narrowing in the ophthalmic segment of the left ICA. The ICA termini are intact bilaterally. A1 and M1 segments are normal. The anterior communicating artery is patent. There is some narrowing of distal MCA branch vessels bilaterally without a significant proximal stenosis or occlusion. Posterior circulation: The right scratched at the hypoplastic right vertebral artery terminates at the PICA. The left vertebral artery continues as the basilar artery. Left PICA is visualized and normal. Both  posterior cerebral arteries originate from the basilar tip. A right posterior communicating artery contributes. There is attenuation of distal PCA branch vessels without a significant proximal stenosis or occlusion. Venous sinuses: Dural sinuses are patent. The straight sinus deep cerebral veins are intact. Cortical veins are unremarkable. Anatomic variants: None Delayed phase: Postcontrast images demonstrate no pathologic enhancement. Review of the MIP images confirms the above findings IMPRESSION: 1. No large vessel occlusion. 2. Minimal atherosclerotic changes at the carotid bifurcations bilaterally without significant stenosis. 3. Mild-to-moderate cavernous right ICA narrowing. 4. Distal small vessel disease without a significant proximal stenosis, aneurysm, or branch vessel  occlusion within the circle-of-Willis. Electronically Signed   By: San Morelle M.D.   On: 04/19/2017 09:33   Ct Head Wo Contrast  Result Date: 04/17/2017 CLINICAL DATA:  Generalized weakness with unsteady gait. History of CVA. EXAM: CT HEAD WITHOUT CONTRAST TECHNIQUE: Contiguous axial images were obtained from the base of the skull through the vertex without intravenous contrast. COMPARISON:  MRI 02/28/2016, CT 02/28/2016 FINDINGS: Brain: Redemonstration of moderate periventricular white matter hypo density compatible with chronic small vessel ischemia. Chronic small basal ganglial lacunar infarcts are noted on the right as well as within the right thalamus. No acute intracranial hemorrhage, large vascular territory infarct, midline shift or edema. No intra-axial mass nor extra-axial collections. Mild superficial and moderate central atrophy is noted sulcal and ventricular prominence. Midline fourth ventricle basal cisterns without effacement. Vascular: No hyperdense vessel sign or unexpected calcification. Skull: Negative for acute fracture. Negative for suspicious osseous lesions. Sinuses/Orbits: Clear bilateral mastoids. Intact orbits and globes. No acute sinus disease. Probable small mucous retention cyst in the anterior left maxillary sinus. Other: None IMPRESSION: Chronic moderate small vessel ischemic disease of periventricular white matter. No acute intracranial abnormality. Electronically Signed   By: Ashley Royalty M.D.   On: 04/17/2017 18:09   Ct Angio Neck W Or Wo Contrast  Result Date: 04/19/2017 CLINICAL DATA:  Left ACA territory infarct.  Right ICA stenosis. EXAM: CT ANGIOGRAPHY HEAD AND NECK TECHNIQUE: Multidetector CT imaging of the head and neck was performed using the standard protocol during bolus administration of intravenous contrast. Multiplanar CT image reconstructions and MIPs were obtained to evaluate the vascular anatomy. Carotid stenosis measurements (when applicable) are  obtained utilizing NASCET criteria, using the distal internal carotid diameter as the denominator. CONTRAST:  40m ISOVUE-370 IOPAMIDOL (ISOVUE-370) INJECTION 76% COMPARISON:  MRI brain 04/18/17. CT head without contrast 04/17/2017. FINDINGS: CT HEAD FINDINGS Brain: Confluent periventricular white matter hypoattenuation is present bilaterally. Remote lacunar infarcts are again seen in the basal ganglia bilaterally. Insular ribbon is normal. No acute hemorrhage is present. The acute white matter infarct is not discretely visualized by CT. No significant extra-axial fluid collections are present. Remote lacunar infarcts are again noted in the pons. Cerebellum is unremarkable. Vascular: Atherosclerotic calcifications are present within the cavernous internal carotid arteries bilaterally. There is no hyperdense vessel. Skull: Calvarium is intact. No focal lytic or blastic lesions are present. Sinuses: The paranasal sinuses and mastoid air cells are clear. Orbits: Globes and orbits are within normal limits. Review of the MIP images confirms the above findings CTA NECK FINDINGS Aortic arch: There is a common origin of the left common carotid artery in the innominate artery. Minimal atherosclerotic changes are present at the arch without aneurysm or focal stenosis. Right carotid system: The right common carotid artery is within normal limits. The bifurcation is unremarkable. There is some tortuosity of the distal cervical right ICA without significant stenosis.  Left carotid system: The left common carotid artery is within normal limits. Mild atherosclerotic changes are present at the carotid bifurcation without a significant stenosis. The cervical left ICA is normal. Vertebral arteries: The vertebral arteries originate from the subclavian arteries without significant stenosis. There is artifact at the origins due to prominent venous contrast. The left vertebral artery is the dominant vessel. There is no significant stenosis  to either vertebral artery in the neck. Skeleton: Vertebral body heights alignment are maintained. No focal lytic or blastic lesions are present. Other neck: The soft tissues the neck are otherwise unremarkable. There is no significant adenopathy. Thyroid is within normal limits. Salivary glands are unremarkable. Upper chest: The lung apices demonstrate mild dependent atelectasis bilaterally. There is no focal nodule or mass lesion. The thoracic inlet is within normal limits. Review of the MIP images confirms the above findings CTA HEAD FINDINGS Anterior circulation: Atherosclerotic calcifications are present within the cavernous internal carotid arteries bilaterally. Mild-to-moderate narrowing is confirmed within the cavernous right ICA. There is mild narrowing in the ophthalmic segment of the left ICA. The ICA termini are intact bilaterally. A1 and M1 segments are normal. The anterior communicating artery is patent. There is some narrowing of distal MCA branch vessels bilaterally without a significant proximal stenosis or occlusion. Posterior circulation: The right scratched at the hypoplastic right vertebral artery terminates at the PICA. The left vertebral artery continues as the basilar artery. Left PICA is visualized and normal. Both posterior cerebral arteries originate from the basilar tip. A right posterior communicating artery contributes. There is attenuation of distal PCA branch vessels without a significant proximal stenosis or occlusion. Venous sinuses: Dural sinuses are patent. The straight sinus deep cerebral veins are intact. Cortical veins are unremarkable. Anatomic variants: None Delayed phase: Postcontrast images demonstrate no pathologic enhancement. Review of the MIP images confirms the above findings IMPRESSION: 1. No large vessel occlusion. 2. Minimal atherosclerotic changes at the carotid bifurcations bilaterally without significant stenosis. 3. Mild-to-moderate cavernous right ICA  narrowing. 4. Distal small vessel disease without a significant proximal stenosis, aneurysm, or branch vessel occlusion within the circle-of-Willis. Electronically Signed   By: San Morelle M.D.   On: 04/19/2017 09:33   Mr Jodene Nam Head Wo Contrast  Result Date: 04/18/2017 CLINICAL DATA:  Stroke follow-up EXAM: MRA HEAD WITHOUT CONTRAST TECHNIQUE: Angiographic images of the Circle of Willis were obtained using MRA technique without intravenous contrast. COMPARISON:  Brain MRI 04/18/2017 FINDINGS: Intracranial internal carotid arteries: There is moderate narrowing of the right internal carotid artery clinoid and ophthalmic segments. Normal left internal carotid artery. Anterior cerebral arteries: Normal. Middle cerebral arteries: Normal. Posterior communicating arteries: Present bilaterally. Posterior cerebral arteries: Normal. Basilar artery: Normal. Superior cerebellar arteries: Normal. IMPRESSION: Moderate stenosis of the distal right internal carotid artery, predominantly the clinoid and ophthalmic segments. Otherwise normal intracranial MRA. Electronically Signed   By: Ulyses Jarred M.D.   On: 04/18/2017 20:36   Mr Brain Wo Contrast  Result Date: 04/18/2017 CLINICAL DATA:  Initial evaluation for worsening slurred speech. History of prior strokes. EXAM: MRI HEAD WITHOUT CONTRAST TECHNIQUE: Multiplanar, multiecho pulse sequences of the brain and surrounding structures were obtained without intravenous contrast. COMPARISON:  Prior CT from 04/17/2017 as well as previous MRI from 02/28/2016. FINDINGS: Brain: Advanced cerebral atrophy for age. Patchy and confluent T2/FLAIR hyperintensity involving the periventricular and deep white matter both cerebral hemispheres, most consistent with chronic small vessel ischemic disease. There are numerous remote lacunar infarcts involving the bilateral basal ganglia, thalami, and  brainstem/pons. Numerous chronic micro hemorrhages seen scattered throughout the  supratentorial brain, brainstem, and cerebellum, most likely related to chronic underlying hypertension. 7 mm acute ischemic infarcts seen involving the posterior left periventricular white matter (series 5001, image 69). No associated hemorrhage or mass effect. Minimal patchy diffusion abnormality within the deep white matter of the anterior right frontal lobe also likely reflects sequelae of acute to subacute ischemia (series 5001, image 69). No associated hemorrhage. No mass lesion, midline shift or mass effect. No hydrocephalus. No extra-axial fluid collection. Pituitary gland suprasellar region within normal limits. Midline structures intact. Vascular: Right vertebral artery hypoplastic and not well visualize, terminating in PICA. Major intracranial vascular flow voids otherwise maintained. Skull and upper cervical spine: Craniocervical junction within normal limits. Upper cervical spine normal. Bone marrow signal intensity within normal limits. No scalp soft tissue abnormality. Sinuses/Orbits: Globes and orbital soft tissues within normal limits. Small left maxillary sinus retention cyst noted. Paranasal sinuses are otherwise clear. No mastoid effusion. Inner ear structures normal. Other: None. IMPRESSION: 1. 7 mm acute ischemic infarct involving the left periventricular white matter. Additional faint patchy small vessel infarct involving the deep white matter of the anterior right frontal lobe. No associated hemorrhage. 2. Advanced cerebral atrophy with chronic small vessel ischemic disease for age, with multiple remote lacunar infarcts involving the cerebral hemispheric white matter, thalami, and brainstem. 3. Innumerable chronic micro hemorrhages throughout the brain, likely related to chronic ischemia and/or hypertension. Electronically Signed   By: Jeannine Boga M.D.   On: 04/18/2017 02:10    Microbiology: No results found for this or any previous visit (from the past 240 hour(s)).    Labs: Basic Metabolic Panel: Recent Labs  Lab 04/17/17 1556 04/17/17 1621 04/19/17 0632  NA 139 141 136  K 4.4 4.4 3.9  CL 105 103 103  CO2 27  --  22  GLUCOSE 127* 123* 118*  BUN 23* 26* 18  CREATININE 1.39* 1.50* 1.28*  CALCIUM 9.1  --  8.8*   Liver Function Tests: Recent Labs  Lab 04/17/17 1556  AST 19  ALT 14  ALKPHOS 72  BILITOT 0.4  PROT 7.0  ALBUMIN 3.8   No results for input(s): LIPASE, AMYLASE in the last 168 hours. No results for input(s): AMMONIA in the last 168 hours. CBC: Recent Labs  Lab 04/17/17 1556 04/17/17 1621 04/19/17 0632  WBC 7.8  --  7.4  NEUTROABS 5.4  --   --   HGB 12.4 13.3 12.5  HCT 39.0 39.0 39.3  MCV 88.2  --  87.7  PLT 194  --  186   Cardiac Enzymes: No results for input(s): CKTOTAL, CKMB, CKMBINDEX, TROPONINI in the last 168 hours. BNP: BNP (last 3 results) No results for input(s): BNP in the last 8760 hours.  ProBNP (last 3 results) No results for input(s): PROBNP in the last 8760 hours.  CBG: Recent Labs  Lab 04/19/17 1219 04/19/17 1634 04/19/17 2208 04/20/17 0656 04/20/17 1202  GLUCAP 150* 100* 107* 107* 142*       Signed:  Velvet Bathe MD.  [facility] 04/20/2017, 3:18 PM

## 2017-04-20 NOTE — Progress Notes (Signed)
Patient discharged home with spouse. Discharge information and prescription given. IV and telemetry discontinued. Patient questions asked and answered. Patient transported from unit via wheelchair with staff.

## 2017-04-20 NOTE — Care Management (Signed)
CM consult for OP therapy received.  [facility].  Referrals placed for PT and SLP at Neuro rehab on [address] per MD note.  OT referral sent to [facility] site as Neuro rehab was not an option.  Information placed on AVS.

## 2017-04-20 NOTE — Plan of Care (Signed)
  Problem: Ischemic Stroke/TIA Tissue Perfusion: Goal: Complications of ischemic stroke/TIA will be minimized Outcome: Progressing   Problem: Nutrition: Goal: Risk of aspiration will decrease Outcome: Progressing   Problem: Self-Care: Goal: Ability to communicate needs accurately will improve Outcome: Progressing   Problem: Safety: Goal: Ability to remain free from injury will improve Outcome: Progressing

## 2017-04-23 ENCOUNTER — Other Ambulatory Visit: Payer: Self-pay | Admitting: *Deleted

## 2017-04-23 DIAGNOSIS — I10 Essential (primary) hypertension: Secondary | ICD-10-CM

## 2017-04-23 MED ORDER — LISINOPRIL 40 MG PO TABS: 40.0000 mg | ORAL_TABLET | Freq: Every day | ORAL | 1 refills | Status: DC

## 2017-04-23 MED ORDER — FLUTICASONE PROPIONATE 50 MCG/ACT NA SUSP: 2.0000 | Freq: Every day | NASAL | 1 refills | Status: AC

## 2017-05-02 ENCOUNTER — Other Ambulatory Visit: Payer: Self-pay

## 2017-05-02 ENCOUNTER — Ambulatory Visit: Payer: Medicaid Other | Admitting: Speech Pathology

## 2017-05-02 ENCOUNTER — Encounter (HOSPITAL_BASED_OUTPATIENT_CLINIC_OR_DEPARTMENT_OTHER): Payer: Self-pay | Admitting: *Deleted

## 2017-05-02 ENCOUNTER — Emergency Department (HOSPITAL_BASED_OUTPATIENT_CLINIC_OR_DEPARTMENT_OTHER)
Admission: EM | Admit: 2017-05-02 | Discharge: 2017-05-03 | Disposition: A | Discharge status: Home / Self Care | Payer: Medicaid Other | Attending: Emergency Medicine | Admitting: Emergency Medicine

## 2017-05-02 DIAGNOSIS — I130 Hypertensive heart and chronic kidney disease with heart failure and stage 1 through stage 4 chronic kidney disease, or unspecified chronic kidney disease: Secondary | ICD-10-CM | POA: Diagnosis not present

## 2017-05-02 DIAGNOSIS — Z7984 Long term (current) use of oral hypoglycemic drugs: Secondary | ICD-10-CM | POA: Diagnosis not present

## 2017-05-02 DIAGNOSIS — K625 Hemorrhage of anus and rectum: Secondary | ICD-10-CM | POA: Insufficient documentation

## 2017-05-02 DIAGNOSIS — Z79899 Other long term (current) drug therapy: Secondary | ICD-10-CM | POA: Insufficient documentation

## 2017-05-02 DIAGNOSIS — N183 Chronic kidney disease, stage 3 (moderate): Secondary | ICD-10-CM | POA: Insufficient documentation

## 2017-05-02 DIAGNOSIS — Z7982 Long term (current) use of aspirin: Secondary | ICD-10-CM | POA: Insufficient documentation

## 2017-05-02 DIAGNOSIS — K5900 Constipation, unspecified: Secondary | ICD-10-CM | POA: Diagnosis not present

## 2017-05-02 DIAGNOSIS — E1122 Type 2 diabetes mellitus with diabetic chronic kidney disease: Secondary | ICD-10-CM | POA: Diagnosis not present

## 2017-05-02 DIAGNOSIS — I5030 Unspecified diastolic (congestive) heart failure: Secondary | ICD-10-CM | POA: Diagnosis not present

## 2017-05-02 LAB — BASIC METABOLIC PANEL
Anion gap: 11 (ref 5–15)
BUN: 17 mg/dL (ref 6–20)
CALCIUM: 8.7 mg/dL — AB (ref 8.9–10.3)
CO2: 22 mmol/L (ref 22–32)
Chloride: 102 mmol/L (ref 101–111)
Creatinine, Ser: 1.23 mg/dL — ABNORMAL HIGH (ref 0.44–1.00)
GFR calc Af Amer: 59 mL/min — ABNORMAL LOW (ref >60)
GFR, EST NON AFRICAN AMERICAN: 51 mL/min — AB (ref >60)
GLUCOSE: 204 mg/dL — AB (ref 65–99)
Potassium: 3.4 mmol/L — ABNORMAL LOW (ref 3.5–5.1)
Sodium: 135 mmol/L (ref 135–145)

## 2017-05-02 LAB — CBC WITH DIFFERENTIAL/PLATELET
BASOS ABS: 0 10*3/uL (ref 0.0–0.1)
BASOS PCT: 0 %
EOS PCT: 0 %
Eosinophils Absolute: 0 10*3/uL (ref 0.0–0.7)
HCT: 39.8 % (ref 36.0–46.0)
Hemoglobin: 13.1 g/dL (ref 12.0–15.0)
Lymphocytes Relative: 6 %
Lymphs Abs: 1.1 10*3/uL (ref 0.7–4.0)
MCH: 28.2 pg (ref 26.0–34.0)
MCHC: 32.9 g/dL (ref 30.0–36.0)
MCV: 85.6 fL (ref 78.0–100.0)
MONO ABS: 1.4 10*3/uL — AB (ref 0.1–1.0)
Monocytes Relative: 8 %
Neutro Abs: 14.9 10*3/uL — ABNORMAL HIGH (ref 1.7–7.7)
Neutrophils Relative %: 86 %
Platelets: 194 10*3/uL (ref 150–400)
RBC: 4.65 MIL/uL (ref 3.87–5.11)
RDW: 13.5 % (ref 11.5–15.5)
WBC: 17.4 10*3/uL — ABNORMAL HIGH (ref 4.0–10.5)

## 2017-05-02 LAB — OCCULT BLOOD X 1 CARD TO LAB, STOOL: FECAL OCCULT BLD: POSITIVE — AB

## 2017-05-02 NOTE — ED Notes (Signed)
Patient experienced stroke recently and has not been able to move and exercise as much as normal.  Patient experienced constipation increasing in severity 3 days ago.  Patient took a laxative last night then experienced bloody loose stools with bright red blood on paper when she wiped.

## 2017-05-02 NOTE — ED Triage Notes (Signed)
Pt reports bright red stools (x2 today), reports as diarrhea. She took laxative yesterday for constipation. Vomiting (x2) without noted blood. Reports right lower abdominal pain that also started today.

## 2017-05-02 NOTE — ED Provider Notes (Signed)
[facility] EMERGENCY DEPARTMENT Provider Note   CSN: [id number] Arrival date & time: 05/02/17  2105     History   Chief Complaint Chief Complaint  Patient presents with  . Rectal Bleeding    HPI Stephanie Lloyd is a 47 y.o. female.  She is brought in here by her husband today.  She complained of not having a bowel movement for a few days which is not unusual for her.  Starting yesterday she took a laxative and it began working overnight.  She states she had multiple hard to than looser bowel movements and eventually into liquid.  These were nonbloody.  At time she said she had manually pull the stool down with toilet paper.  Today she has had 2 bowel movements that are associated with bright red blood in the bowl.  It is associated with a little bit of right lower quadrant discomfort that is since resolved.  She has been feeling weak during the day today.  There is been no fever.  There is no history of prior GI bleeding.  She has had no urinary symptoms and no blood in her urine.  There is been no vomiting.  She has no prior surgical abdominal history.  She has a prior history of a stroke and is on a full dose aspirin.  The history is provided by the patient and the spouse.  Rectal Bleeding  Quality:  Bright red Amount:  Moderate Timing:  Intermittent Chronicity:  New Context: constipation and diarrhea   Similar prior episodes: no   Relieved by:  Nothing Worsened by:  Defecation Ineffective treatments:  None tried Associated symptoms: abdominal pain   Associated symptoms: no dizziness, no epistaxis, no fever, no hematemesis, no light-headedness, no loss of consciousness, no recent illness and no vomiting     Past Medical History:  Diagnosis Date  . Diabetes ([place])   . Diabetes mellitus without complication ([place])   . Diastolic dysfunction   . Hypertension   . Immune deficiency disorder ([place])   . Menometrorrhagia 04/01/2016  . Stroke [facility])     Patient Active  Problem List   Diagnosis Date Noted  . Facial droop 04/18/2017  . CKD (chronic kidney disease), stage III ([place]) 04/18/2017  . Anxiety 01/16/2017  . Leg cramps 01/16/2017  . Hypoglycemia due to insulin 08/29/2016  . Diverticulosis 08/29/2016  . 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 Diverticulitis 08/29/2016  . 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 OSA (obstructive sleep apnea) 06/10/2016  . Iron deficiency anemia due to chronic blood loss 04/01/2016  . Menometrorrhagia 04/01/2016  . Upper respiratory infection 03/25/2016  . History of stroke   . History of intracerebral hemorrhage without residual deficit   . 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 Ischemic stroke ([place]) 02/28/2016  . Acute kidney injury ([place]) 10/28/2015  . Acute encephalopathy 10/28/2015  . Diastolic dysfunction   . Stroke (cerebrum) ([place])   . Focal infarction of brain ([place]) 10/08/2015  . Syncope 08/19/2015  . Hypertensive emergency 07/29/2015  . Hypertensive urgency 07/28/2015  . Hypokalemia 07/28/2015  . Accelerated hypertension 09/21/2014  . Type 2 diabetes mellitus with other circulatory complications ([place]) 57/84/6962  . Hyperlipidemia 09/21/2014  . 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 Hyperlipidemia LDL goal <70 07/29/2014  . Hemorrhagic stroke ([place]) 06/22/2014  . ICH (intracerebral hemorrhage) ([place]) 06/22/2014  . Essential hypertension 05/13/2012  Past Surgical History:  Procedure Laterality Date  . NO PAST SURGERIES       OB History    Gravida  2   Para  1   Term  1   Preterm  0   AB  1   Living  1     SAB  0   TAB  0   Ectopic  0   Multiple      Live Births               Home Medications    Prior to Admission medications   Medication Sig Start Date End Date Taking? Authorizing Provider  ACCU-CHEK SOFTCLIX LANCETS lancets Use as directed twice  a day.  Dx Code: E11.9 01/31/17   Carollee Herter, Alferd Apa, DO  aspirin EC 325 MG EC tablet Take 1 tablet (325 mg total) by mouth daily. 03/02/16   Steve Rattler, DO  atorvastatin (LIPITOR) 80 MG tablet Take 1 tablet (80 mg total) by mouth daily at 6 PM. 04/20/17   Velvet Bathe, MD  Blood Glucose Monitoring Suppl (ACCU-CHEK AVIVA PLUS) w/Device KIT Use as directed twice a day.  Dx Code: E11.9 01/31/17   Carollee Herter, Alferd Apa, DO  fluticasone (FLONASE) 50 MCG/ACT nasal spray Place 2 sprays into both nostrils daily. 04/23/17   Roma Schanz R, DO  glucose blood (ACCU-CHEK AVIVA PLUS) test strip Use as directed twice a day.  Dx Code: E11.9 01/31/17   Carollee Herter, Alferd Apa, DO  imiquimod (ALDARA) 5 % cream Apply topically 3 (three) times a week. Apply until total clearance or maximum of 16 weeks Patient not taking: Reported on 04/17/2017 08/16/16   Lavonia Drafts, MD  Insulin Pen Needle (B-D UF III MINI PEN NEEDLES) 31G X 5 MM MISC To use with insulin 10/17/16   Carollee Herter, Alferd Apa, DO  linagliptin (TRADJENTA) 5 MG TABS tablet Take 1 tablet (5 mg total) by mouth daily. 03/31/17   Renato Shin, MD  lisinopril (PRINIVIL,ZESTRIL) 40 MG tablet Take 1 tablet (40 mg total) by mouth daily. 04/23/17   Roma Schanz R, DO  pioglitazone (ACTOS) 30 MG tablet Take 1 tablet (30 mg total) by mouth daily. 02/28/17   Ann Held, DO  prednisoLONE acetate (PRED FORTE) 1 % ophthalmic suspension Place 1 drop into both eyes 2 (two) times daily.     [provider]  solifenacin (VESICARE) 10 MG tablet Take 1 tablet (10 mg total) by mouth daily. 03/04/17   Ann Held, DO  Vitamin D, Ergocalciferol, (DRISDOL) 50000 units CAPS capsule Take 1 capsule (50,000 Units total) by mouth every 7 (seven) days. Patient not taking: Reported on 04/17/2017 08/02/16   Lyndal Pulley, DO    Family History Family History  Problem Relation Age of Onset  . Diabetes Father   . Hypertension Father   . Stroke  Father   . Diabetes Paternal Uncle   . Stroke Paternal Uncle   . Stroke Brother   . Cancer Neg Hx   . Breast cancer Neg Hx   . Ovarian cancer Neg Hx     Social History Social History   Tobacco Use  . Smoking status: Never Smoker  . Smokeless tobacco: Never Used  Substance Use Topics  . Alcohol use: No    Alcohol/week: 0.0 oz    Comment: occ  . Drug use: No     Allergies   Metformin and related   Review of Systems  Review of Systems  Constitutional: Positive for fatigue. Negative for chills and fever.  HENT: Negative for ear pain, nosebleeds and sore throat.   Eyes: Negative for pain and visual disturbance.  Respiratory: Negative for cough and shortness of breath.   Cardiovascular: Negative for chest pain and palpitations.  Gastrointestinal: Positive for abdominal pain, anal bleeding, blood in stool, constipation and hematochezia. Negative for hematemesis and vomiting.  Genitourinary: Negative for dysuria and hematuria.  Musculoskeletal: Negative for arthralgias and back pain.  Skin: Negative for color change and rash.  Neurological: Positive for speech difficulty (prior hx cva). Negative for dizziness, seizures, loss of consciousness, syncope and light-headedness.  All other systems reviewed and are negative.    Physical Exam Updated Vital Signs BP (!) 155/104 (BP Location: Right Arm)   Pulse 96   Temp 98.3 F (36.8 C) (Oral)   Resp 20   Ht '5\' 10"'  (1.778 m)   Wt 89.4 kg (197 lb)   SpO2 100%   BMI 28.27 kg/m   Physical Exam  Constitutional: She appears well-developed and well-nourished. No distress.  HENT:  Head: Normocephalic and atraumatic.  Eyes: Conjunctivae are normal.  Neck: Neck supple.  Cardiovascular: Normal rate and regular rhythm.  No murmur heard. Pulmonary/Chest: Effort normal and breath sounds normal. No respiratory distress.  Abdominal: Soft. There is no tenderness.  Genitourinary: Rectal exam shows guaiac positive stool. Rectal exam  shows no external hemorrhoid, no internal hemorrhoid, no fissure, no mass, no tenderness and anal tone normal.  Genitourinary Comments: Chaperone was present during exam. [employer].    Musculoskeletal: She exhibits no edema or deformity.  Neurological: She is alert. GCS eye subscore is 4. GCS verbal subscore is 5. GCS motor subscore is 6.  She is slightly dysarthric speech.  He is easily understandable.  She denies any worsening of strength from her baseline.  Skin: Skin is warm and dry.  Psychiatric: She has a normal mood and affect.  Nursing note and vitals reviewed.    ED Treatments / Results  Labs (all labs ordered are listed, but only abnormal results are displayed) Labs Reviewed  BASIC METABOLIC PANEL - Abnormal; Notable for the following components:      Result Value   Potassium 3.4 (*)    Glucose, Bld 204 (*)    Creatinine, Ser 1.23 (*)    Calcium 8.7 (*)    GFR calc non Af Amer 51 (*)    GFR calc Af Amer 59 (*)    All other components within normal limits  CBC WITH DIFFERENTIAL/PLATELET - Abnormal; Notable for the following components:   WBC 17.4 (*)    Neutro Abs 14.9 (*)    Monocytes Absolute 1.4 (*)    All other components within normal limits  OCCULT BLOOD X 1 CARD TO LAB, STOOL - Abnormal; Notable for the following components:   Fecal Occult Bld POSITIVE (*)    All other components within normal limits    EKG None  Radiology No results found.  Procedures Procedures (including critical care time)  Medications Ordered in ED Medications - No data to display   Initial Impression / Assessment and Plan / ED Course  I have reviewed the triage vital signs and the nursing notes.  Pertinent labs & imaging results that were available during my care of the patient were reviewed by me and considered in my medical decision making (see chart for details).  Clinical Course as of May 04 1111  Fri May 02, 2017  2323 Differential diagnosis includes hemorrhoids,  anal fissure, colitis, AVM.  Her abdominal exam is benign and she is afebrile.  On exam she has normal tone and no masses palpated.  She did have a scant amount of blood on the glove and will likely be quite positive.  She is slightly elevated white count with no clear source.  Her renal function is at baseline.  As the patient is nontoxic and this is all in the setting of recent constipation and laxatives I feel the patient could be discharged and observe this at home.  She understands that she may end up needing colonoscopy and I have explained what to watch out for.   [MB]    Clinical Course User Index [MB] Hayden Rasmussen, MD    Final Clinical Impressions(s) / ED Diagnoses   Final diagnoses:  Rectal bleeding    ED Discharge Orders    None       Hayden Rasmussen, MD 05/03/17 1113

## 2017-05-02 NOTE — Discharge Instructions (Addendum)
Your evaluated in the emergency department for rectal bleeding.  An obvious cause of your bleeding was not identified but it is likely due to your recent constipation.  These episodes are usually benign and will resolve on their own.  If your bleeding worsens, is associated with clots, or is causing you to feel faint you should return to the emergency department.  You should follow-up with your regular doctor and you may need to discuss with them scheduling a colonoscopy.

## 2017-05-06 ENCOUNTER — Encounter: Payer: Self-pay | Admitting: Family Medicine

## 2017-05-06 ENCOUNTER — Ambulatory Visit (INDEPENDENT_AMBULATORY_CARE_PROVIDER_SITE_OTHER): Payer: Medicaid Other | Admitting: Family Medicine

## 2017-05-06 VITALS — BP 138/100 | HR 73 | Temp 98.2°F | Resp 16 | Ht 70.0 in | Wt 200.0 lb

## 2017-05-06 DIAGNOSIS — N183 Chronic kidney disease, stage 3 unspecified: Secondary | ICD-10-CM

## 2017-05-06 DIAGNOSIS — I10 Essential (primary) hypertension: Secondary | ICD-10-CM

## 2017-05-06 DIAGNOSIS — E785 Hyperlipidemia, unspecified: Secondary | ICD-10-CM | POA: Diagnosis not present

## 2017-05-06 DIAGNOSIS — K625 Hemorrhage of anus and rectum: Secondary | ICD-10-CM | POA: Diagnosis not present

## 2017-05-06 MED ORDER — AMLODIPINE BESYLATE 5 MG PO TABS: 5.0000 mg | ORAL_TABLET | Freq: Every day | ORAL | 2 refills | Status: DC

## 2017-05-06 NOTE — Patient Instructions (Signed)

## 2017-05-06 NOTE — Progress Notes (Signed)
Check labs.  Adjust meds prnCheck labs.  Adjust meds prn Patient ID: Stephanie Lloyd, female    DOB: [birth year]  Age: 47 y.o. MRN: [id number]    Subjective:  Subjective  HPI Stephanie Lloyd presents for f/u hosp from cva.  Pt went to er again for rectal bleed and it was recommended she go to GI She was in hosp  3/21-3/24.  She has f/u with neuro and has ot ,pt and speech coming to house  Review of Systems  Constitutional: Positive for fatigue. Negative for activity change, appetite change and unexpected weight change.  Respiratory: Negative for cough and shortness of breath.   Cardiovascular: Negative for chest pain and palpitations.  Neurological: Positive for speech difficulty and weakness.  Psychiatric/Behavioral: Negative for behavioral problems and dysphoric mood. The patient is not nervous/anxious.     History Past Medical History:  Diagnosis Date  . Diabetes ([place])   . Diabetes mellitus without complication ([place])   . Diastolic dysfunction   . Hypertension   . Immune deficiency disorder ([place])   . Menometrorrhagia 04/01/2016  . Stroke [facility])     She has a past surgical history that includes No past surgeries.   Her family history includes Diabetes in her father and paternal uncle; Hypertension in her father; Stroke in her brother, father, and paternal uncle.She reports that she has never smoked. She has never used smokeless tobacco. She reports that she does not drink alcohol or use drugs.  Current Outpatient Medications on File Prior to Visit  Medication Sig Dispense Refill  . ACCU-CHEK SOFTCLIX LANCETS lancets Use as directed twice a day.  Dx Code: E11.9 100 each 1  . aspirin EC 325 MG EC tablet Take 1 tablet (325 mg total) by mouth daily. 30 tablet 11  . atorvastatin (LIPITOR) 80 MG tablet Take 1 tablet (80 mg total) by mouth daily at 6 PM. 30 tablet 0  . Blood Glucose Monitoring Suppl (ACCU-CHEK AVIVA PLUS) w/Device KIT Use as directed twice a day.  Dx Code: E11.9 1 kit 0    . fluticasone (FLONASE) 50 MCG/ACT nasal spray Place 2 sprays into both nostrils daily. 16 g 1  . glucose blood (ACCU-CHEK AVIVA PLUS) test strip Use as directed twice a day.  Dx Code: E11.9 100 each 1  . Insulin Pen Needle (B-D UF III MINI PEN NEEDLES) 31G X 5 MM MISC To use with insulin 100 each 2  . linagliptin (TRADJENTA) 5 MG TABS tablet Take 1 tablet (5 mg total) by mouth daily. 30 tablet 11  . lisinopril (PRINIVIL,ZESTRIL) 40 MG tablet Take 1 tablet (40 mg total) by mouth daily. 90 tablet 1  . pioglitazone (ACTOS) 30 MG tablet Take 1 tablet (30 mg total) by mouth daily. 30 tablet 2  . prednisoLONE acetate (PRED FORTE) 1 % ophthalmic suspension Place 1 drop into both eyes 2 (two) times daily.     . solifenacin (VESICARE) 10 MG tablet Take 1 tablet (10 mg total) by mouth daily. 30 tablet 2   No current facility-administered medications on file prior to visit.      Objective:  Objective  Physical Exam  Constitutional: She is oriented to person, place, and time. She appears well-developed and well-nourished.  HENT:  Head: Normocephalic and atraumatic.  Eyes: Conjunctivae and EOM are normal.  Neck: Normal range of motion. Neck supple. No JVD present. Carotid bruit is not present. No thyromegaly present.  Cardiovascular: Normal rate, regular rhythm and normal heart sounds.  No murmur  heard. Pulmonary/Chest: Effort normal and breath sounds normal. No respiratory distress. She has no wheezes. She has no rales. She exhibits no tenderness.  Musculoskeletal: She exhibits no edema.  Neurological: She is alert and oriented to person, place, and time.  Psychiatric: She has a normal mood and affect.  Nursing note and vitals reviewed.  BP (!) 138/100   Pulse 73   Temp 98.2 F (36.8 C) (Oral)   Resp 16   Ht '5\' 10"'  (1.778 m)   Wt 200 lb (90.7 kg)   SpO2 98%   BMI 28.70 kg/m  Wt Readings from Last 3 Encounters:  05/06/17 200 lb (90.7 kg)  05/02/17 197 lb (89.4 kg)  04/18/17 197 lb 1.5  oz (89.4 kg)     Lab Results  Component Value Date   WBC 17.4 (H) 05/02/2017   HGB 13.1 05/02/2017   HCT 39.8 05/02/2017   PLT 194 05/02/2017   GLUCOSE 204 (H) 05/02/2017   CHOL 219 (H) 04/18/2017   TRIG 51 04/18/2017   HDL 79 04/18/2017   LDLCALC 130 (H) 04/18/2017   ALT 14 04/17/2017   AST 19 04/17/2017   NA 135 05/02/2017   K 3.4 (L) 05/02/2017   CL 102 05/02/2017   CREATININE 1.23 (H) 05/02/2017   BUN 17 05/02/2017   CO2 22 05/02/2017   TSH 0.969 08/30/2016   INR 1.11 04/17/2017   HGBA1C 7.0 03/31/2017    No results found.   Assessment & Plan:  Plan  I have discontinued Stephanie Lloyd's Vitamin D (Ergocalciferol) and imiquimod. I am also having her start on amLODipine. Additionally, I am having her maintain her aspirin, prednisoLONE acetate, Insulin Pen Needle, ACCU-CHEK AVIVA PLUS, glucose blood, ACCU-CHEK SOFTCLIX LANCETS, pioglitazone, solifenacin, linagliptin, atorvastatin, lisinopril, and fluticasone.  Meds ordered this encounter  Medications  . amLODipine (NORVASC) 5 MG tablet    Sig: Take 1 tablet (5 mg total) by mouth daily.    Dispense:  30 tablet    Refill:  2    Problem List Items Addressed This Visit      Unprioritized   CKD (chronic kidney disease), stage III [facility])    Per nephrology      Essential hypertension    Uncontrolled--- add norvasc       Relevant Medications   amLODipine (NORVASC) 5 MG tablet   Hyperlipidemia LDL goal <70    Lab Results  Component Value Date   CHOL 219 (H) 04/18/2017   HDL 79 04/18/2017   LDLCALC 130 (H) 04/18/2017   TRIG 51 04/18/2017   CHOLHDL 2.8 04/18/2017         Relevant Medications   amLODipine (NORVASC) 5 MG tablet    Other Visit Diagnoses    Rectal bleed    -  Primary   Relevant Orders   Ambulatory referral to Gastroenterology   HTN, goal below 130/80       Relevant Medications   amLODipine (NORVASC) 5 MG tablet      Follow-up: Return in about 3 months (around 08/05/2017) for diabetes  II, hyperlipidemia, hypertension.  Ann Held, DO

## 2017-05-07 ENCOUNTER — Encounter: Payer: Self-pay | Admitting: Gastroenterology

## 2017-05-08 NOTE — Assessment & Plan Note (Signed)
Per nephrology 

## 2017-05-08 NOTE — Assessment & Plan Note (Signed)
Uncontrolled--- add norvasc

## 2017-05-08 NOTE — Assessment & Plan Note (Signed)
Lab Results  Component Value Date   CHOL 219 (H) 04/18/2017   HDL 79 04/18/2017   LDLCALC 130 (H) 04/18/2017   TRIG 51 04/18/2017   CHOLHDL 2.8 04/18/2017

## 2017-05-12 ENCOUNTER — Encounter: Payer: Self-pay | Admitting: Neurology

## 2017-05-12 ENCOUNTER — Ambulatory Visit (INDEPENDENT_AMBULATORY_CARE_PROVIDER_SITE_OTHER): Payer: Medicaid Other | Admitting: Neurology

## 2017-05-12 VITALS — BP 130/84 | HR 87 | Ht 70.0 in | Wt 197.4 lb

## 2017-05-12 DIAGNOSIS — E785 Hyperlipidemia, unspecified: Secondary | ICD-10-CM | POA: Diagnosis not present

## 2017-05-12 DIAGNOSIS — E1159 Type 2 diabetes mellitus with other circulatory complications: Secondary | ICD-10-CM | POA: Diagnosis not present

## 2017-05-12 DIAGNOSIS — Z8679 Personal history of other diseases of the circulatory system: Secondary | ICD-10-CM | POA: Insufficient documentation

## 2017-05-12 DIAGNOSIS — G4733 Obstructive sleep apnea (adult) (pediatric): Secondary | ICD-10-CM

## 2017-05-12 DIAGNOSIS — I639 Cerebral infarction, unspecified: Secondary | ICD-10-CM

## 2017-05-12 NOTE — Patient Instructions (Addendum)
-   continue ASA and lipitor for stroke prevention - get BP monitoring device and check BP at home - will do sleep test to evaluate for sleep apnea. - continue PT and will order speech therapy too.  - diabetic diet and regular exercise - check glucose at home and avoid low sugar - Follow up with your primary care physician for stroke risk factor modification. Recommend maintain blood pressure goal <130/80, diabetes with hemoglobin A1c goal below 6.5% and lipids with LDL cholesterol goal below 70 mg/dL.  - follow up in 3 months with Stephanie Lloyd

## 2017-05-12 NOTE — Progress Notes (Signed)
STROKE NEUROLOGY FOLLOW UP NOTE  NAME: [name] DOB: February 16, 1970  REASON FOR VISIT: stroke follow up HISTORY FROM: pt and chart  Today we had the pleasure of seeing Stephanie Lloyd in follow-up at our Neurology [facility]. Pt was accompanied by husband.   History Summary Stephanie Lloyd is a 47 y.o. female with PMH of HTN, DM was admitted on 06/22/14 for acute onset of left sided weakness, slurry speech and dysphagia. 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 She was admitted to neuro ICU with Cardene drip and repeat CT head secondary showed stable hematoma. Other stroke workup showed negative 2-D echo and carotid Doppler, however LDL 140 and A1c 9.0. She was started on Lipitor. She also had elevated creatinine. Her BP was further controlled by po meds. After stabilization, patient was discharged home with PCP follow-up in one week.   09/21/15 follow up - the patient has been doing well. Initially she needs walker for walking, but right now she is able to walk without any device. Left-sided weakness much resolved with only mild left hand dexterity difficulties. Her blood pressure today 116/73, and she reported compliant with medication. She has his pediatrics follow-up next months. She admitted snoring at night, however denies apnea. She declined a sleep study.  Admitted on 10/08/15 for right-sided numbness. MRI showed acute infarct within the posterior inferior left midbrain and pons on the left with multiple remote infarcts. Severe diffuse microbleds and ischemic white matter changes. Due to extensive microbleds, lacunar infarcts, significant white matter ischemic changes at such young age, single gene mutation associated vasculopathy such as COL4A1 and COL4A2 and CADASIL and CARASIL need to be considered. Genetic testing would be more informative. CTA H&N - High-grade left P2 segment stenosis. TTE unremarkable. LDL 102 and A1C 8.5. Put on ASA 81mg  and continued  pravastatin.   Admitted again on 02/28/16 for slurred speech and generalized weakness. MRI showed left paramedian pontine infarct, small right insular cortex infarct, and extensive old ischemic changes. EF 60-65% and no SOE. Increased ASA to 325mg . pt is not a good candidate for anticoagulation due to hx of ICH and extensive microbleds on MRI, also did not recommend plavix for the same reason. LDL 99, changed to lipitor 80. Genetic testing not approved by insurance.  06/10/16  Follow up - pt has been doing well. Do not have BP machine at home but today BP good at 110/76. Her glucose not in good control, especially after meal. She is on 70/30 bid. Recommend to follow up with PCP to consider premeal insulin or refer to endocrinology. She has not been on lipitor since discharge, will refill. Pt still has mild dysarthria and right deltoid intermittent pain, but no weakness or numbness.  04/17/17 admission to [facility] for dysphagia, dysarthria and facial weakness. MRI acute left periventricular WM and subacute anterior right frontal punctate infarcts.  Multiple old lacunar white matter, thalamic and brainstem infarcts.  Innumerable chronic hemorrhages.  MRA moderate stenosis of the distal right ICA, predominantly in the glenoid and ophthalmic segments.  CTA head and neck mild cavernous right ICA narrowing.  EF 60-65%.  LDL 130 and A1c 7.0.  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  Continue aspirin 325, do not recommend Plavix at that time due to innumerable microhemorrhages and a history of a right brain ICH.  Interval History During the interval time,  Patient  has been doing well.  No recurrent stroke.  Still has mild slurred speech, and baseline left hemiparetic gait.  Going to have PT next week, but will order speech therapy too.  Had rectal bleeding 05/02/17 due to constipation and laxative use.  BP today 130/84.  REVIEW OF SYSTEMS: Full  14 system review of systems performed and notable only for those listed below and in HPI above, all others are negative:  Constitutional: Activity change, fatigue Cardiovascular: Leg swelling, murmur Ear/Nose/Throat: Trouble swallowing Skin:  Eyes:  Blurry vision, loss of vision, light sensitivity Respiratory: Choking Gastroitestinal: Abdominal pain, rectal bleeding, constipation, vomiting Genitourinary: incontinence of bladder, difficulty urination Hematology/Lymphatic:  anemia Endocrine: heat intolerance, cold intolerance Musculoskeletal: Joint pain, back pain, aching muscles, walking difficulty, neck pain Allergy/Immunology:   Neurological:  Facial drooping, memory loss, weakness, slurry speech, dizziness Psychiatric: confusion, agitation, nervous/anxious Sleep: Insomnia, snoring  The following represents the patient's updated allergies and side effects list: Allergies  Allergen Reactions  . Metformin And Related Diarrhea    The neurologically relevant items on the patient's problem list were reviewed on today's visit.  Neurologic Examination  A problem focused neurological exam (12 or more points of the single system neurologic examination, vital signs counts as 1 point, cranial nerves count for 8 points) was performed.  Blood pressure 130/84, pulse 87, height 5\' 10"  (1.778 m), weight 197 lb 6.4 oz (89.5 kg).  General - Well nourished, well developed, in no apparent distress.  Ophthalmologic - Sharp disc margins OU.  Cardiovascular - Regular rate and rhythm with no murmur.  Mental Status -  Level of arousal and orientation to time, place, and person were intact. Language including expression, naming, repetition, comprehension was assessed and found intact, mild dysarthria. Fund of Knowledge was assessed and was intact.  Cranial Nerves II - XII - II - Visual field intact OU. III, IV, VI - Extraocular movements intact. V - Facial sensation intact bilaterally. VII -  Facial movement intact bilaterally. VIII - Hearing & vestibular intact bilaterally. X - Palate elevates symmetrically, mild dysarthria. XI - Chin turning & shoulder shrug intact bilaterally. XII - Tongue protrusion intact.  Motor Strength - The patient's strength was normal in all extremities and pronator drift was absent.  Bulk was normal and fasciculations were absent.   Motor Tone - Muscle tone was assessed at the neck and appendages and was normal.  Reflexes - The patient's reflexes were 1+ in all extremities and she had no pathological reflexes.  Sensory - Light touch, temperature/pinprick were assessed and were normal.    Coordination - The patient had normal movements in the hands and feet with no ataxia or dysmetria.  Tremor was absent.  Gait and Station - mild left hemiparetic gait  Data reviewed: I personally reviewed the images and agree with the radiology interpretations.  Ct Head Wo Contrast  06/23/2014 IMPRESSION: 13 mm hemorrhage right thalamus with mild local mass-effect. No ventricular extension Atrophy and chronic microvascular ischemia.   06/22/2014 IMPRESSION: 1. Acute approximately 1.5 cm right thalamic intraparenchymal hemorrhage without intraventricular extension or significant mass effect. While potentially hypertensive in etiology, given extensive age advanced microvascular ischemic disease, hemorrhagic conversion of a microvascular infarct could have a similar appearance. As such, further evaluation with brain MRI is recommended. 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   Carotid Doppler - Bilateral: 1-39% ICA stenosis. Vertebral artery flow is antegrade.  2D Echocardiogram - Left ventricle: The cavity size was normal. Wall thickness was increased in a  pattern of mild LVH. Systolic function was normal. The estimated ejection fraction was in the range of 60% to 65%. Wall motion was normal; there were no regional wall  motion abnormalities. Features are consistent with a pseudonormal left ventricular filling pattern, with concomitant abnormal relaxation and increased filling pressure (grade 2 diastolic dysfunction). Doppler parameters are consistent with high ventricular filling pressure. - Atrial septum: No defect or patent foramen ovale was identified. Impressions: - No cardiac source of emboli was indentified.  MRA head 09/2014 1. The right posterior cerebral artery distal segments have moderate to severe stenosis. 2. The right internal carotid artery supraclinoid segment has moderate focal stenosis. 3. The right A1 segment origin has moderate stenosis. 4. The left posterior cerebral artery has mild irregular atherosclerotic changes.   MRI brain 09/2014 1. 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 2. Moderate periventricular and subcortical and pontine chronic small vessel ischemic disease. In addition, there are numerous chronic cerebral microhemorrhages in the bilateral cerebral hemispheres, right pons and right cerebellum, also likely related to chronic small vessel ischemic disease.  3. No acute findings.   MRI brain 10/08/15 1. Acute 7 mm nonhemorrhagic infarct within the posterior inferior left midbrain and pons on the left. 2. Multiple more remote lacunar infarcts are present throughout the lower midbrain and upper pons bilaterally. 3. Multiple remote lacunar infarcts are present throughout the basal ganglia. 4. Advanced diffuse white matter disease. 5. Multiple foci of susceptibility throughout the brainstem and bilateral cerebral hemispheres suggesting underlying vasculitis.  CTA head and neck 10/08/15 1. No acute arterial finding. 2. Intracranial atherosclerosis with notable high-grade left P2 segment stenosis. 3. Mild atherosclerosis of the neck without stenosis. 4. Advanced  chronic microvascular disease. Known acute brainstem infarct without evidence of progression or hemorrhage. 5. Small layering pleural effusions.  Brain MRI 02/28/16 Acute infarction affecting the left para median pons. Small focus of acute infarction in the right insular cortex. Extensive old ischemic changes throughout the brainstem, thalami and cerebral hemispheric white matter. Many of the old infarctions demonstrate hemosiderin deposition.  TTE 02/29/16 Left ventricle: The cavity size was normal. Wall thickness was   increased in a pattern of moderate LVH. Systolic function was   normal. The estimated ejection fraction was in the range of 60%   to 65%. Wall motion was normal; there were no regional wall   motion abnormalities. Doppler parameters are consistent with   abnormal left ventricular relaxation (grade 1 diastolic   dysfunction). - Aortic valve: There was trivial regurgitation. - Mitral valve: There was mild regurgitation. Impressions: - Normal LV systolic function; grade 1 diastolic dysfunction;   moderate LVH; trace AI; mild MR.  Ct Angio Head W Or Wo Contrast Ct Angio Neck W Or Wo Contrast 04/19/2017 IMPRESSION:  1. No large vessel occlusion.  2. Minimal atherosclerotic changes at the carotid bifurcations bilaterally without significant stenosis.  3. Mild-to-moderate cavernous right ICA narrowing.  4. Distal small vessel disease without a significant proximal stenosis, aneurysm, or branch vessel occlusion within the circle-of-Willis.   Ct Head Wo Contrast 04/17/2017 IMPRESSION:  Chronic moderate small vessel ischemic disease of periventricular white matter. No acute intracranial abnormality.   Mr Maxine Glenn Head Wo Contrast 04/18/2017 IMPRESSION:  Moderate stenosis of the distal right internal carotid artery, predominantly the clinoid and ophthalmic segments. Otherwise normal intracranial MRA.   Mr Brain Wo Contrast 04/18/2017 IMPRESSION:  1. 7 mm acute ischemic  infarct involving the left periventricular  white matter. Additional faint patchy small vessel infarct involving the deep white matter of the anterior right frontal lobe.  No associated hemorrhage.  2. Advanced cerebral atrophy with chronic small vessel ischemic disease for age, with multiple remote lacunar infarcts involving the cerebral hemispheric white matter, thalami, and brainstem.  3. Innumerable chronic micro hemorrhages throughout the brain, likely related to chronic ischemia and/or hypertension.   Transthoracic Echocardiogram  04/19/2017 Study Conclusions - Left ventricle: The cavity size was normal. Wall thickness was increased in a pattern of mild LVH. Systolic function was normal. The estimated ejection fraction was in the range of 60% to 65%. Wall motion was normal; there were no regional wall motion abnormalities. Left ventricular diastolic function parameters were normal. - Atrial septum: A patent foramen ovale cannot be excluded.  Component     Latest Ref Rng 06/23/2014  Cholesterol     0 - 200 mg/dL 409 (H)  Triglycerides     <150 mg/dL 41  HDL Cholesterol     >40 mg/dL 72  Total CHOL/HDL Ratio      3.1  VLDL     0 - 40 mg/dL 8  LDL (calc)     0 - 99 mg/dL 811 (H)  Hemoglobin B1Y     4.8 - 5.6 % 9.0 (H)  Mean Plasma Glucose      212  TSH     0.350 - 4.500 uIU/mL 0.548   Component     Latest Ref Rng & Units 10/09/2015 10/28/2015 02/29/2016 03/01/2016  Cholesterol     0 - 200 mg/dL 782  956   Triglycerides     <150 mg/dL 213  74   HDL Cholesterol     >40 mg/dL 50  56   Total CHOL/HDL Ratio     RATIO 3.5  3.0   VLDL     0 - 40 mg/dL 22  15   LDL (calc)     0 - 99 mg/dL 086 (H)  99   Hemoglobin A1C     4.8 - 5.6 % 8.5 (H)  6.9 (H)   Mean Plasma Glucose     mg/dL 578  469   RPR     Non Reactive  Non Reactive    Vitamin B12     211 - 911 pg/mL  495    TSH     0.450 - 4.500 uIU/mL    0.755   Component     Latest Ref Rng & Units 03/07/2016  05/20/2016  Cholesterol     0 - 200 mg/dL    Triglycerides     <629 mg/dL    HDL Cholesterol     >40 mg/dL    Total CHOL/HDL Ratio     RATIO    VLDL     0 - 40 mg/dL    LDL (calc)     0 - 99 mg/dL    Hemoglobin B2W     4.8 - 5.6 %    Mean Plasma Glucose     mg/dL    RPR     Non Reactive    Vitamin B12     211 - 911 pg/mL 411   TSH     0.450 - 4.500 uIU/mL  1.230   Component     Latest Ref Rng & Units 01/31/2017 03/31/2017 04/18/2017  Cholesterol     0 - 200 mg/dL   413 (H)  Triglycerides     <150 mg/dL   51  HDL  Cholesterol     >40 mg/dL   79  Total CHOL/HDL Ratio     RATIO   2.8  VLDL     0 - 40 mg/dL   10  LDL (calc)     0 - 99 mg/dL   161 (H)  Hemoglobin W9U      7.3 7.0     Hx of stroke/ICH  03/2017 -  acute left periventricular WM and subacute anterior right frontal punctate infarcts. CTA head and neck mild cavernous right ICA narrowing.  EF 60-65%.  LDL 130 and A1c 7.0.  Continue on aspirin.  01/2016 - left pontine, right insular small infarcts, EF 60-65%, LDL 99, aspirin 81 changed to 325, continue Lipitor 80  09/2015 -left pontine/midbrain infarct secondary to small vessel disease- genetic testing recommended but not able to do due to insurance issue -CTA head and neck left P2 high-grade stenosis, EF 65-70%, LDL 102, A1c 8.5 - on aspirin and pravastatin  05/2014 -right small thalamic ICH likely due to hypertensionand DM -LDL 140 and A1c 9.0 -MRI showed extensive CMBs and white matter changes.  MRA negative  Assessment: As you may recall, she is a 48 y.o. African American female with PMH of HTN and DM was admitted on 06/22/14 for right thalamic small ICH. She was admitted to neuro ICU with Cardene drip and repeat CT head secondary showed stable hematoma. LDL 140 and A1c 9.0. She was started on Lipitor. She also had elevated creatinine. Her BP was further controlled by po meds. After discharge, left-sided weakness much improved. Repeat MRI and MRA showed hematoma  resolution, and no AVM or aneurysm, however extensive CMBs and WM ischemic changes.   Admitted on 10/08/15 for acute infarct within the posterior inferior left midbrain and pons on the left with multiple remote infarcts. Severe diffuse microbleds and ischemic white matter changes. CTA H&N - High-grade left P2 segment stenosis. TTE EF 65-70%. LDL 102 and A1C 8.5. Put on ASA 81mg  and continued pravastatin.   Admitted again on 02/28/16 for left paramedian pontine infarct, small right insular cortex infarct, and extensive old ischemic changes. EF 60-65% and no SOE. Increased ASA to 325mg . pt is not a good candidate for anticoagulation due to hx of ICH and extensive microbleds on MRI, also did not recommend plavix for the same reason. LDL 99, changed to lipitor 80.   She does have multiple risk factors (HTN, DM, HLD), as well as extensive hx of lacunar infarcts, right thalamic ICH, extensive microbleds, and significant white matter ischemic changes, small vessel disease is most likely diagnosis. However, due to extensive microbleds, lacunar infarcts, significant white matter ischemic changes at such a young age, single gene mutation associated vasculopathy such as COL4A1, COL4A2, CADASIL and CARASIL need to be considered. Genetic testing would be more informative. Genetic testing so far not approved by insurance (Medicaid).  04/17/17 admission to [facility] for acute left periventricular WM and subacute anterior right frontal punctate infarcts.  CTA head and neck mild cavernous right ICA narrowing.  EF 60-65%.  LDL 130 and A1c 7.0.  Stroke most likely small vessel disease given hx of SVD, pontine infarcts & thalamic small ICH as well as severe CMBs.  Continue aspirin 325, do not recommend Plavix at that time due to innumerable microhemorrhages and a history of a right brain ICH. Pt still has s/s of OSA, will do sleep study. Continue PT and will order speech therapy.  Plan:  - continue ASA and lipitor for stroke  prevention -  get BP monitoring device and check BP at home - will do sleep test to evaluate for sleep apnea. - continue PT and will order speech therapy too.  - diabetic diet and regular exercise - check glucose at home and avoid low sugar - Follow up with your primary care physician for stroke risk factor modification. Recommend maintain blood pressure goal <130/80, diabetes with hemoglobin A1c goal below 6.5% and lipids with LDL cholesterol goal below 70 mg/dL.  - follow up in 3 months with Shanda BumpsJessica  I spent more than 25 minutes of face to face time with the patient. Greater than 50% of time was spent in counseling and coordination of care. We discussed PT and speech therapy, and sleep study.    Orders Placed This Encounter  Procedures  . Ambulatory referral to Sleep Studies    Referral Priority:   Routine    Referral Type:   Consultation    Referral Reason:   Specialty Services Required    Number of Visits Requested:   1  . Ambulatory referral to Speech Therapy    Referral Priority:   Routine    Referral Type:   Speech Therapy    Referral Reason:   Specialty Services Required    Requested Specialty:   Speech Pathology    Number of Visits Requested:   1    No orders of the defined types were placed in this encounter.   Patient Instructions  - continue ASA and lipitor for stroke prevention - get BP monitoring device and check BP at home - will do sleep test to evaluate for sleep apnea. - continue PT and will order speech therapy too.  - diabetic diet and regular exercise - check glucose at home and avoid low sugar - Follow up with your primary care physician for stroke risk factor modification. Recommend maintain blood pressure goal <130/80, diabetes with hemoglobin A1c goal below 6.5% and lipids with LDL cholesterol goal below 70 mg/dL.  - follow up in 3 months with Fernand ParkinsJessica   Andreus Cure, MD PhD [facility] [address], [address] [place], [place]  [id number] [phone]

## 2017-05-13 ENCOUNTER — Ambulatory Visit: Payer: Medicaid Other | Admitting: Physical Therapy

## 2017-05-21 ENCOUNTER — Ambulatory Visit: Payer: Medicaid Other

## 2017-05-22 ENCOUNTER — Ambulatory Visit: Payer: Medicaid Other | Admitting: Family Medicine

## 2017-05-26 ENCOUNTER — Ambulatory Visit: Payer: Medicaid Other | Attending: Family Medicine | Admitting: Physical Therapy

## 2017-05-26 ENCOUNTER — Other Ambulatory Visit: Payer: Self-pay

## 2017-05-26 ENCOUNTER — Encounter: Payer: Self-pay | Admitting: Physical Therapy

## 2017-05-26 DIAGNOSIS — R2689 Other abnormalities of gait and mobility: Secondary | ICD-10-CM | POA: Insufficient documentation

## 2017-05-26 DIAGNOSIS — R293 Abnormal posture: Secondary | ICD-10-CM | POA: Insufficient documentation

## 2017-05-26 DIAGNOSIS — I69351 Hemiplegia and hemiparesis following cerebral infarction affecting right dominant side: Secondary | ICD-10-CM | POA: Diagnosis present

## 2017-05-26 DIAGNOSIS — R278 Other lack of coordination: Secondary | ICD-10-CM | POA: Diagnosis present

## 2017-05-26 DIAGNOSIS — M6281 Muscle weakness (generalized): Secondary | ICD-10-CM | POA: Diagnosis present

## 2017-05-26 DIAGNOSIS — R2681 Unsteadiness on feet: Secondary | ICD-10-CM | POA: Diagnosis present

## 2017-05-26 DIAGNOSIS — R29898 Other symptoms and signs involving the musculoskeletal system: Secondary | ICD-10-CM | POA: Insufficient documentation

## 2017-05-27 ENCOUNTER — Ambulatory Visit: Payer: Self-pay

## 2017-05-27 ENCOUNTER — Ambulatory Visit: Payer: Medicaid Other | Attending: Neurology

## 2017-05-27 ENCOUNTER — Ambulatory Visit: Payer: Medicaid Other

## 2017-05-27 DIAGNOSIS — R1312 Dysphagia, oropharyngeal phase: Secondary | ICD-10-CM | POA: Diagnosis present

## 2017-05-27 DIAGNOSIS — R41841 Cognitive communication deficit: Secondary | ICD-10-CM | POA: Insufficient documentation

## 2017-05-27 DIAGNOSIS — R471 Dysarthria and anarthria: Secondary | ICD-10-CM | POA: Diagnosis present

## 2017-05-27 NOTE — Patient Instructions (Addendum)
  Think "SLOP" when you talk SLOW LOUD OVER-ENUNCIATE Pause  ==============================           Speech Exercises      Repeat 3-5 times, 2 times a day  Call the cat "Buttercup" A calendar of [place], [place] Four floors to cover Yellow oil ointment Fellow lovers of felines Catastrophe in [place] Plump plumbers' plums The church's chimes chimed Telling time 'til eleven Five valve levers Keep the gate closed Go see that guy Fat cows give milk Automatic Data Gophers Fat frogs flip freely [employer] into bed Get that game to [employer] Thick thistles stick together Cinnamon aluminum linoleum Black bugs blood Lovely lemon linament Red leather, yellow leather  Big grocery buggy    Purple baby carriage [facility] Proper copper coffee pot Ripe purple cabbage Three free throws [employer] tackled  [employer] dipped the dessert  [employer] Buckle that [employer] Shirts shrink, shells shouldn't [place] [employer] Take the tackle box File the flash message Give me five flapjacks Fundamental relatives Dye the pets purple Talking [place] time after time Dark chocolate chunks  ======================================  LIP and TONGUE exercises  --- DO ALL EXERCISES TWICE EACH DAY  1. Lip press - Press your lips together firmly (like making a /m/ sound) and hold 5 seconds -repeat 15 times  2. LOUD and LONG Pryor Montes - make a kissing sound as loud as you can, as long as you can - repeat 15 times  3. Tongue sweep - Move your tongue from way to the right to way to the left, in the pockets of your mouth - touch every tooth  - ten revolutions each way  4. PUH! - 15 times       MAKE THE SOUNDS STRONG!      TUH - 15 times      KUH! - 15 times  5. Move a licorice stick or straw from side to side in your mouth - 10 back and forth movements  6. Say "OOOOO", and "EEEEE" 10 times (Kiss and smile)  7.  Put air in your cheeks, and push on either side 10 times  *KEEP THE AIR IN and DON'T LET IT ESCAPE!!*  8. Put your tongue in your left cheek and press against your finger for 3 seconds, then do your right cheek - 15 times each side   ===============================  Signs of Aspiration Pneumonia   . Chest pain/tightness . Fever (can be low grade) . Cough  o With foul-smelling phlegm (sputum) o With sputum containing pus or blood o With greenish sputum . Fatigue  . Shortness of breath  . Wheezing   **IF YOU HAVE THESE SIGNS, CONTACT YOUR DOCTOR OR GO TO THE EMERGENCY DEPARTMENT OR URGENT CARE AS SOON AS POSSIBLE**    ============================================ Slow rate of eating Small bites, small sips Turn TV off while eating and no other distractions  If you get choked with your pills with water, try them with applesauce, yogurt, or pudding.  You said you don't want another swallowing test right now. If you change your mind, you can talk to Dr. Roda Shutters about it.

## 2017-05-27 NOTE — Therapy (Signed)
[facility] [address] [place], [place], [address] Phone: [phone]   Fax:  [phone]  Physical Therapy Evaluation  Patient Details  Name: Stephanie Lloyd MRN: [id number] Date of Birth: Jan 23, 1971 Referring Provider: Marvel Plan, MD Neurologist Donato Schultz, DO PCP)   Encounter Date: 05/26/2017  PT End of Session - 05/26/17 1207    Visit Number  1    Number of Visits  10    Authorization Type  Medicaid  PT submitting for initial 3 visits and OT & Speech to evaluate then team discussion for optimal use of her 27 Medicaid treatments.     PT Start Time  1050    PT Stop Time  1130    PT Time Calculation (min)  40 min    Equipment Utilized During Treatment  Gait belt    Activity Tolerance  Patient limited by fatigue    Behavior During Therapy  WFL for tasks assessed/performed;Flat affect       Past Medical History:  Diagnosis Date  . Diabetes (HCC)   . Diabetes mellitus without complication (HCC)   . Diastolic dysfunction   . Hypertension   . Immune deficiency disorder (HCC)   . Menometrorrhagia 04/01/2016  . Stroke [facility])     Past Surgical History:  Procedure Laterality Date  . NO PAST SURGERIES      There were no vitals filed for this visit.   Subjective Assessment - 05/26/17 1232    Subjective  This 47yo female was referred on 05/12/2017 for Physical Therapy Evaluation by Marvel Plan, MD (hospitalist) with diagnosis of ischemic stroke. PCP is Zola Button, [place] R, DO. This CVA was 5th and 1st was May 2016. She was hospitalized 04/17/2017 - 04/20/2017 with ischemic stroke acute left periventricular white matter.    Pertinent History  hx of CVA/ICH 03/2017, 01/2016, 09/2015 & 05/2014, HTN, DM2, polyneuropathy,  facial droop, CKD st III,     Limitations  Lifting;Walking;House hold activities;Standing    Patient Stated Goals  Be able to walk better & faster, do more activities    Currently in Pain?   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       Precautions   Precautions  Fall no driving      Balance Screen   Has the patient fallen in the past 6 months  No    Has the patient had a decrease in activity level because of a fear of falling?   No    Is the patient reluctant to leave their home because of a fear of falling?   No      Home Environment   Living Environment  Private residence    Living Arrangements  Spouse/significant other;Parent husband has sickle cell and father has dementia    Type of Home  House    Home Access  Ramped entrance    Home Layout  One level    Home Equipment  Walker - 2 wheels;Cane - single point;Toilet riser      Prior Function   Level of Independence  Independent;Independent with household  mobility without device;Independent with community mobility without device      Coordination   Gross Motor Movements are Fluid and Coordinated  No    Fine Motor Movements are Fluid and Coordinated  No    Finger Nose Finger Test  slow & dysmetria      Posture/Postural Control   Posture/Postural Control  Postural limitations    Postural Limitations  Rounded Shoulders;Forward head;Flexed trunk      ROM / Strength   AROM / PROM / Strength  PROM;Strength      PROM   Overall PROM   Deficits    Overall PROM Comments  right frozen shoulder,  tightness in LEs hamstrings, hip flexors & gastroc.      Strength   Overall Strength  Deficits    Strength Assessment Site  Hip;Knee;Ankle    Right/Left Hip  Right;Left    Right Hip Flexion  4-/5    Right Hip Extension  3-/5    Right Hip ABduction  3-/5    Left Hip Flexion  4+/5    Left Hip Extension  4/5    Left Hip ABduction  4/5    Right/Left Knee   Right;Left    Right Knee Flexion  4-/5    Right Knee Extension  4-/5    Left Knee Flexion  4/5    Left Knee Extension  4/5    Right/Left Ankle  Right;Left    Right Ankle Dorsiflexion  4-/5    Left Ankle Dorsiflexion  4+/5      Transfers   Transfers  Sit to Stand;Stand to Sit    Sit to Stand  5: Supervision;With upper extremity assist;With armrests;From chair/3-in-1 uses back of legs against chair to stabilize    Stand to Sit  5: Supervision;With upper extremity assist;With armrests;To chair/3-in-1 uses back of legs against chair      Ambulation/Gait   Ambulation/Gait  Yes    Ambulation/Gait Assistance  5: Supervision    Ambulation Distance (Feet)  100 Feet    Assistive device  None    Gait Pattern  Shuffle;Narrow base of support;Poor foot clearance - right;Trunk flexed;Lateral hip instability;Right flexed knee in stance;Left flexed knee in stance;Decreased arm swing - right;Decreased arm swing - left;Step-to pattern;Decreased stride length    Ambulation Surface  Indoor;Level    Gait velocity  1.53 ft/sec  indicates high fall risk      Standardized Balance Assessment   Standardized Balance Assessment  Berg Balance Test;Timed Up and Go Test;Dynamic Gait Index      Berg Balance Test   Sit to Stand  Able to stand  independently using hands    Standing Unsupported  Able to stand safely 2 minutes    Sitting with Back Unsupported but Feet Supported on Floor or Stool  Able to sit safely and securely 2 minutes    Stand to Sit  Controls descent by using hands    Transfers  Able to transfer safely, definite need of hands    Standing Unsupported with Eyes Closed  Able to stand 10 seconds with supervision    Standing Ubsupported with Feet Together  Able to place feet together independently and stand for 1 minute with supervision    From Standing, Reach Forward with Outstretched Arm  Reaches forward but needs supervision    From Standing Position, Pick up Object from Floor  Able to pick up shoe,  needs supervision    From Standing Position, Turn to Look Behind Over each Shoulder  Turn  sideways only but maintains balance    Turn 360 Degrees  Needs close supervision or verbal cueing    Standing Unsupported, Alternately Place Feet on Step/Stool  Able to complete >2 steps/needs minimal assist    Standing Unsupported, One Foot in Front  Able to take small step independently and hold 30 seconds    Standing on One Leg  Tries to lift leg/unable to hold 3 seconds but remains standing independently    Total Score  34      Dynamic Gait Index   Level Surface  --    Change in Gait Speed  --    Gait with Horizontal Head Turns  --    Gait with Vertical Head Turns  --    Gait and Pivot Turn  --    Step Over Obstacle  --    Step Around Obstacles  --    Steps  --    Total Score  --      Timed Up and Go Test   Normal TUG (seconds)  18.02    Cognitive TUG (seconds)  21.17 stops naming with gait      Functional Gait  Assessment   Gait assessed   Yes    Gait Level Surface  Walks 20 ft, slow speed, abnormal gait pattern, evidence for imbalance or deviates 10-15 in outside of the 12 in walkway width. Requires more than 7 sec to ambulate 20 ft.    Change in Gait Speed  Makes only minor adjustments to walking speed, or accomplishes a change in speed with significant gait deviations, deviates 10-15 in outside the 12 in walkway width, or changes speed but loses balance but is able to recover and continue walking.    Gait with Horizontal Head Turns  Performs head turns with moderate changes in gait velocity, slows down, deviates 10-15 in outside 12 in walkway width but recovers, can continue to walk.    Gait with Vertical Head Turns  Performs task with moderate change in gait velocity, slows down, deviates 10-15 in outside 12 in walkway width but recovers, can continue to walk.    Gait and Pivot Turn  Turns slowly, requires verbal cueing, or requires several small steps to catch balance following turn and  stop    Step Over Obstacle  Cannot perform without assistance.    Gait with Narrow Base of Support  Ambulates less than 4 steps heel to toe or cannot perform without assistance.    Gait with Eyes Closed  Cannot walk 20 ft without assistance, severe gait deviations or imbalance, deviates greater than 15 in outside 12 in walkway width or will not attempt task.    Ambulating Backwards  Walks 20 ft, slow speed, abnormal gait pattern, evidence for imbalance, deviates 10-15 in outside 12 in walkway width.    Steps  Two feet to a stair, must use rail.    Total Score  7                Objective measurements completed on examination: See above findings.                PT Short Term Goals - 05/26/17 1515      PT SHORT TERM GOAL #1   Title  Patient demonstrates understanding of initial HEP. (All STGs Target Date 06/20/2017)    Baseline  Patient is dependent in appropriate exercises with medical issues.     Time  3    Period  Weeks  Status  New    Target Date  06/20/17      PT SHORT TERM GOAL #2   Title  Patient standing balance reaches 5" anteriorly & to floor without UE support with supervision.     Baseline  Patient requires minA to reach 5" or to floor without UE support.     Time  3    Period  Weeks    Status  New    Target Date  06/20/17      PT SHORT TERM GOAL #3   Title  Patient ambulates 200' without device with supervision.     Baseline  Patient ambulates 100' without device with min guard.     Time  3    Period  Weeks    Status  New    Target Date  06/20/17        PT Long Term Goals - 05/26/17 1523      PT LONG TERM GOAL #1   Title  Patient demonstrates & verbalizes understanding of ongoing HEP.    Baseline  Patient is dependent with appropriate exercises with medical conditions.     Time  3    Period  Months    Status  New      PT LONG TERM GOAL #2   Title  Berg Balance >45/56 to indicate lower fall risk.     Baseline  Berg Balance 34/56     Time  3    Period  Months    Status  New      PT LONG TERM GOAL #3   Title  Timed Up-Go standard <13.5seconds and cognitive TUG <17.5seconds to indicate lower fall risk.     Baseline  std TUG 18.02sec and cognitive TUG 21.17    Time  3    Period  Months    Status  New      PT LONG TERM GOAL #4   Title  Functional Gait Assessment >/= 15/30    Baseline  FGA 7/30    Time  3    Period  Months    Status  New      PT LONG TERM GOAL #5   Title  Gait Velocity >2.00 ft/sec to indicate improved functional mobility.    Baseline  Gait Velocity 1.53 ft/sec    Time  3    Period  Months    Status  New             Plan - 05/26/17 1237    Clinical Impression Statement  This 47yo female was hospitalized 04/17/2017-04/20/2017 with fifth CVA. She had an acute ischemic left periventricular white matter CVA. This CVA has increased deficits with weakness & coordination of RUE & RLE and decreased function & mobility. Berg Balance Test score 34/56 indicates high fall risk & dependency in standing ADLs. Timed Up-Go of 18.02 seconds and cognitive TUG of 21.17sec & stops simple naming tasks with gait - both TUGs indicate high fall risk. Patient has gait deviations indicating balance deficits. Gait Velocity is 1.53 ft/sec (at PT discharge May 2018 gait velocity was 3.42 ft/sec). Functional Gait Assessment of 7/30 indicates high fall risk (at PT discharge May 2018 FGA was 24/30). She fatigues with 100' of gait. (at PT discharge May 2018 Six-Minute-Walk-Test was 1107'). Patient's function, safety & mobility have declined with recent CVA and would benefit from skilled care.     History and Personal Factors relevant to plan of care:  hx of CVA/ICH 03/2017, 01/2016,  09/2015 & 05/2014, HTN, DM2, polyneuropathy,  facial droop, CKD st III,     Clinical Presentation  Evolving    Clinical Presentation due to:  high fall risk, complex medical history, 5th CVA,     Clinical Decision Making  Moderate    Rehab Potential  Good     Clinical Impairments Affecting Rehab Potential  lives with husband who has disability and father who has dementia,     PT Frequency  1x / week 9 visits over 3 months    PT Duration  Other (comment) 9 visits over 3 months    PT Treatment/Interventions  ADLs/Self Care Home Management;DME Instruction;Gait training;Stair training;Functional mobility training;Therapeutic activities;Therapeutic exercise;Balance training;Neuromuscular re-education;Patient/family education;Passive range of motion;Vestibular;Canalith Repostioning    PT Next Visit Plan  intial HEP including balance, strength & stretches    Recommended Other Services  OT & speech evaluations    Consulted and Agree with Plan of Care  Patient       Patient will benefit from skilled therapeutic intervention in order to improve the following deficits and impairments:  Abnormal gait, Decreased activity tolerance, Decreased balance, Decreased coordination, Decreased endurance, Decreased mobility, Decreased range of motion, Decreased strength, Impaired flexibility, Postural dysfunction  Visit Diagnosis: Abnormal posture  Other symptoms and signs involving the musculoskeletal system  Hemiplegia and hemiparesis following cerebral infarction affecting right dominant side (HCC)  Muscle weakness (generalized)  Unsteadiness on feet  Other abnormalities of gait and mobility  Other lack of coordination     Problem List Patient Active Problem List   Diagnosis Date Noted  . History of intracranial hemorrhage 05/12/2017  . Facial droop 04/18/2017  . CKD (chronic kidney disease), stage III (HCC) 04/18/2017  . Anxiety 01/16/2017  . Leg cramps 01/16/2017  . Hypoglycemia due to insulin 08/29/2016  . Diverticulosis 08/29/2016  . Diverticulitis 08/29/2016  . 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 OSA (obstructive sleep apnea) 06/10/2016  . Iron deficiency anemia due to chronic blood loss  04/01/2016  . Menometrorrhagia 04/01/2016  . Upper respiratory infection 03/25/2016  . History of stroke   . History of intracerebral hemorrhage without residual deficit   . Cerebrovascular accident (CVA) due to thrombosis of basilar artery (HCC) 02/28/2016  . Ischemic stroke (HCC) 02/28/2016  . Acute kidney injury (HCC) 10/28/2015  . Acute encephalopathy 10/28/2015  . Diastolic dysfunction   . Stroke (cerebrum) (HCC)   . Focal infarction of brain (HCC) 10/08/2015  . Syncope 08/19/2015  . Hypertensive emergency 07/29/2015  . Hypertensive urgency 07/28/2015  . Hypokalemia 07/28/2015  . Accelerated hypertension 09/21/2014  . Type 2 diabetes mellitus with other circulatory complications (HCC) 09/21/2014  . Hyperlipidemia 09/21/2014  . 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 Hyperlipidemia LDL goal <70 07/29/2014  . Hemorrhagic stroke (HCC) 06/22/2014  . ICH (intracerebral hemorrhage) (HCC) 06/22/2014  . Essential hypertension 05/13/2012    Vladimir Faster PT, DPT 05/27/2017, 5:31 AM  [facility] [address] [place], [place], [address] Phone: [phone]   Fax:  [phone]  Name: Stephanie Lloyd MRN: [id number] Date of Birth: [birth year]

## 2017-05-28 ENCOUNTER — Ambulatory Visit (INDEPENDENT_AMBULATORY_CARE_PROVIDER_SITE_OTHER): Payer: Medicaid Other | Admitting: Medical

## 2017-05-28 ENCOUNTER — Other Ambulatory Visit: Payer: Self-pay

## 2017-05-28 ENCOUNTER — Other Ambulatory Visit: Payer: Self-pay | Admitting: Neurology

## 2017-05-28 VITALS — BP 104/72 | HR 73

## 2017-05-28 DIAGNOSIS — I10 Essential (primary) hypertension: Secondary | ICD-10-CM | POA: Diagnosis not present

## 2017-05-28 DIAGNOSIS — I639 Cerebral infarction, unspecified: Secondary | ICD-10-CM

## 2017-05-28 NOTE — Patient Instructions (Signed)
Please check your blood pressure at home once a day for 1 week and send the readings to Dr Zola Button through [place] in 1 week. Keep follow up as scheduled with Dr Zola Button on 08/05/17.

## 2017-05-28 NOTE — Therapy (Signed)
[facility] [address] [place], [place], [address] Phone: [phone]   Fax:  [phone]  Speech Language Pathology Evaluation  Patient Details  Name: Stephanie Lloyd MRN: [id number] Date of Birth: July 31, 1970 Referring Provider: Marvel Plan, MD   Encounter Date: 05/27/2017  End of Session - 05/28/17 0805    Visit Number  1    Number of Visits  18    Date for SLP Re-Evaluation  08/15/17    Authorization Type  medicaid    SLP Start Time  1318    SLP Stop Time   1400    SLP Time Calculation (min)  42 min    Activity Tolerance  Patient tolerated treatment well       Past Medical History:  Diagnosis Date  . Diabetes (HCC)   . Diabetes mellitus without complication (HCC)   . Diastolic dysfunction   . Hypertension   . Immune deficiency disorder (HCC)   . Menometrorrhagia 04/01/2016  . Stroke [facility])     Past Surgical History:  Procedure Laterality Date  . NO PAST SURGERIES      There were no vitals filed for this visit.      SLP Evaluation [facility] - 05/28/17 0001      SLP Visit Information   SLP Received On  05/27/17    Referring Provider  Marvel Plan, MD    Onset Date  April 17 2017    Medical Diagnosis  CVA      Subjective   Subjective  "When I get tired my voice slurs a lot more."      General Information   HPI  Pt known to this SLP from previous ST course focusing on speech clarity, swallowing, and cognition. Pt endorses changes in the clarity of her speech, especially in late afternoon/ early evenings ("before dinnertime" pt stated).      Balance Screen   Has the patient fallen in the past 6 months  No      Prior Functional Status   Cognitive/Linguistic Baseline  Baseline deficits    Baseline deficit details  dysarthria, cognitive-linguistics, dysphagia    Type of Home  Mobile home     Lives With  Spouse;Family    Available Support  Family      Cognition   Overall Cognitive Status  History  of cognitive impairments - at baseline    Area of Impairment  Memory;Attention      Auditory Comprehension   Overall Auditory Comprehension  Appears within functional limits for tasks assessed      Verbal Expression   Overall Verbal Expression  Appears within functional limits for tasks assessed      Oral Motor/Sensory Function   Overall Oral Motor/Sensory Function  Impaired    Labial Symmetry  Abnormal symmetry left    Labial Strength  Reduced    Labial Coordination  Reduced    Lingual ROM  Reduced right    Lingual Symmetry  Within Functional Limits    Lingual Strength  Reduced Right    Lingual Sensation  Within Functional Limits    Lingual Coordination  Reduced    Velum  Impaired right    Overall Oral Motor/Sensory Function  Pt reports 3 feasible situational modifications/strategies to make to improve listener intelligibility. She also mentioned 2 speech compensations (slower rate, overarticulation).       Motor Speech   Overall Motor Speech  Impaired at baseline    Phonation  Low vocal intensity;Normal intermittent  Resonance  Hypernasality mild, in connected speech    Articulation  Impaired    Level of Impairment  Phrase    Intelligibility  Intelligible    Effective Techniques  Slow rate;Over-articulate;Increased vocal intensity                      SLP Education - 05/28/17 0759    Education provided  Yes    Education Details  Dysarthria HEP, overt s/s aspiration PNA, oral motor HEP, swallow precautions    Person(s) Educated  Patient    Methods  Explanation;Demonstration;Verbal cues;Handout    Comprehension  Verbalized understanding;Returned demonstration;Verbal cues required;Need further instruction       SLP Short Term Goals - 05/28/17 0815      SLP SHORT TERM GOAL #1   Title  pt will complete dysarthria (intelligibility) HEP with occasional min A    Baseline  mod A occasionally    Time  4    Period  Weeks    Status  New      SLP SHORT TERM  GOAL #2   Title  pt will complete oral motor HEP with occasional min A    Baseline  occasional mod A    Time  4    Period  Weeks    Status  New      SLP SHORT TERM GOAL #3   Title  pt will demo dysarthria compensations in 10/20 sentences    Baseline  not attempted yet    Time  4    Period  Weeks    Status  New       SLP Long Term Goals - 05/28/17 8469      SLP LONG TERM GOAL #1   Title  pt will complete HEP for dysarthria (intelligibility) with rare min A     Baseline  occasional mod A    Time  8    Period  Weeks or 17 total visits, for all LTGs    Status  New      SLP LONG TERM GOAL #2   Title  Pt will utilize compensations for dysarthria in simple conversation over 5 minutes with rare min A    Baseline  pt not utilizing compensations for dysarthria    Time  8    Period  Weeks    Status  New       Plan - 05/28/17 0807    Clinical Impression Statement  Pt with previous courses of ST at this facility last seen November 2018 with new onset of worsening dysarthria (now more notable with pt fatigue) with MRI confirming ischemic infarct of lt perventricular white matter, and patchy small vessel infarct with deep white matter of anterior rt frontal lobe. Pt would benefit from skillled ST focusing on oral motor exercises to incr oral articulator strength, as well as compensations for dysarthria. Pt endorses cont'd rare dysphagia however does not desire modified barium swallow at this time. SLP reviewed pt's swallow precautions from her last swallow exam in February 2018.    Speech Therapy Frequency  1x /week for three weeks, then twice a week for 7 weeks, or 18 total visits    Duration  -- 8 weeks or 17 visits    Treatment/Interventions  Oral motor exercises;Pharyngeal strengthening exercises;Diet toleration management by SLP;Trials of upgraded texture/liquids;Internal/external aids;Multimodal communcation approach;Compensatory strategies;Patient/family education;Functional tasks;SLP  instruction and feedback may include any combination or all    Potential to Achieve Goals  Fair  Potential Considerations  Previous level of function;Severity of impairments    SLP Home Exercise Plan  provided today    Consulted and Agree with Plan of Care  Patient       Patient will benefit from skilled therapeutic intervention in order to improve the following deficits and impairments:   Dysarthria and anarthria - Plan: SLP plan of care cert/re-cert  Dysphagia, oropharyngeal phase - Plan: SLP plan of care cert/re-cert  Cognitive communication deficit - Plan: SLP plan of care cert/re-cert    Problem List Patient Active Problem List   Diagnosis Date Noted  . History of intracranial hemorrhage 05/12/2017  . Facial droop 04/18/2017  . CKD (chronic kidney disease), stage III (HCC) 04/18/2017  . Anxiety 01/16/2017  . Leg cramps 01/16/2017  . Hypoglycemia due to insulin 08/29/2016  . Diverticulosis 08/29/2016  . Diverticulitis 08/29/2016  . 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 OSA (obstructive sleep apnea) 06/10/2016  . Iron deficiency anemia due to chronic blood loss 04/01/2016  . Menometrorrhagia 04/01/2016  . Upper respiratory infection 03/25/2016  . History of stroke   . History of intracerebral hemorrhage without residual deficit   . Cerebrovascular accident (CVA) due to thrombosis of basilar artery (HCC) 02/28/2016  . Ischemic stroke (HCC) 02/28/2016  . Acute kidney injury (HCC) 10/28/2015  . Acute encephalopathy 10/28/2015  . Diastolic dysfunction   . Stroke (cerebrum) (HCC)   . Focal infarction of brain (HCC) 10/08/2015  . Syncope 08/19/2015  . Hypertensive emergency 07/29/2015  . Hypertensive urgency 07/28/2015  . Hypokalemia 07/28/2015  . Accelerated hypertension 09/21/2014  . Type 2 diabetes mellitus with other circulatory complications (HCC) 09/21/2014  . Hyperlipidemia 09/21/2014  . Thalamic hemorrhage with  stroke (HCC) 07/29/2014  . Hyperlipidemia LDL goal <70 07/29/2014  . Hemorrhagic stroke (HCC) 06/22/2014  . ICH (intracerebral hemorrhage) (HCC) 06/22/2014  . Essential hypertension 05/13/2012    [facility] ,MS, CCC-SLP  05/28/2017, 8:29 AM  [facility] [address] [place], [place], [address] Phone: [phone]   Fax:  [phone]  Name: ANAKAREN CAMPION MRN: [id number] Date of Birth: 1970-04-21

## 2017-05-28 NOTE — Progress Notes (Signed)
Pt here for blood pressure check per Dr Zola Button.  BP Readings from Last 3 Encounters:  05/12/17 130/84  05/06/17 (!) 138/100  05/02/17 (!) 148/89    Last visit (05/06/17), pt was started on Amlodipine  once a day. She also takes Lisinopril  once a day.  Pt reports compliance with medication and has no new concerns. Reports intermittent swelling of feet / ankles but reports this was present before she started amlodipine and does not seem to be any worse.  BP @ 1:58pm = 104/72 HR = 72  Pt has follow up with PCP on 08/05/17.  Advised pt per covering Provider, Esperanza Richters, PA-C to continue current medications and follow up with PCP as scheduled. Check blood pressure once a day and send the readings to Dr Laury Axon via [facility] to ensure stability of systolic readings.  This was the advise I gave after review. Pt was asymptomatic.  Esperanza Richters, PA-C

## 2017-05-28 NOTE — Progress Notes (Signed)
Order put in for OT therapy.

## 2017-05-30 ENCOUNTER — Encounter: Payer: Self-pay | Admitting: Endocrinology

## 2017-05-30 ENCOUNTER — Ambulatory Visit: Payer: Medicaid Other | Admitting: Endocrinology

## 2017-05-30 VITALS — BP 122/72 | HR 68 | Wt 203.8 lb

## 2017-05-30 DIAGNOSIS — E1159 Type 2 diabetes mellitus with other circulatory complications: Secondary | ICD-10-CM | POA: Diagnosis not present

## 2017-05-30 LAB — POCT GLYCOSYLATED HEMOGLOBIN (HGB A1C): HEMOGLOBIN A1C: 6.6

## 2017-05-30 NOTE — Patient Instructions (Addendum)
check your blood sugar twice a day.  vary the time of day when you check, between before the 3 meals, and at bedtime.  also check if you have symptoms of your blood sugar being too high or too low.  please keep a record of the readings and bring it to your next appointment here (or you can bring the meter itself).  You can write it on any piece of paper.  please call us sooner if your blood sugar goes below 70, or if you have a lot of readings over 200.   Please continue the same medications.  Please come back for a follow-up appointment in 4 months.    

## 2017-05-30 NOTE — Progress Notes (Signed)
Subjective:    Patient ID: Stephanie Lloyd, female    DOB: [birth year], 47 y.o.   MRN: [id number]  HPI Pt returns for f/u of diabetes mellitus: DM type: 2 Dx'ed: 7681 Complications: polyneuropathy, CVA's, and renal insuff Therapy: 2 oral meds GDM: never DKA: never Severe hypoglycemia: last episode was mid-2018 Pancreatitis: never Pancreatic imaging: normal on 2018 CT.  Other: plan is to d/c insulin [phone]; memory loss limits complexity of regimen. Interval history: Pt says she did not take tradjenta, because medicaid declined.  She says cbg's are well-controlled.  pt states she feels well in general. Past Medical History:  Diagnosis Date  . Diabetes ([place])   . Diabetes mellitus without complication ([place])   . Diastolic dysfunction   . Hypertension   . Immune deficiency disorder ([place])   . Menometrorrhagia 04/01/2016  . Stroke [facility])     Past Surgical History:  Procedure Laterality Date  . NO PAST SURGERIES      Social History   Socioeconomic History  . Marital status: Married    Spouse name: Not on file  . Number of children: Not on file  . Years of education: Not on file  . Highest education level: Not on file  Occupational History  . Not on file  Social Needs  . Financial resource strain: Not on file  . Food insecurity:    Worry: Not on file    Inability: Not on file  . Transportation needs:    Medical: Not on file    Non-medical: Not on file  Tobacco Use  . Smoking status: Never Smoker  . Smokeless tobacco: Never Used  Substance and Sexual Activity  . Alcohol use: No    Alcohol/week: 0.0 oz    Comment: occ  . Drug use: No  . Sexual activity: Yes    Birth control/protection: None  Lifestyle  . Physical activity:    Days per week: Not on file    Minutes per session: Not on file  . Stress: Not on file  Relationships  . Social connections:    Talks on phone: Not on file    Gets together: Not on file    Attends religious service: Not on file   Active member of club or organization: Not on file    Attends meetings of clubs or organizations: Not on file    Relationship status: Not on file  . Intimate partner violence:    Fear of current or ex partner: Not on file    Emotionally abused: Not on file    Physically abused: Not on file    Forced sexual activity: Not on file  Other Topics Concern  . Not on file  Social History Narrative   ** Merged History Encounter **        Current Outpatient Medications on File Prior to Visit  Medication Sig Dispense Refill  . ACCU-CHEK SOFTCLIX LANCETS lancets Use as directed twice a day.  Dx Code: E11.9 100 each 1  . amLODipine (NORVASC) 5 MG tablet Take 1 tablet (5 mg total) by mouth daily. 30 tablet 2  . aspirin EC 325 MG EC tablet Take 1 tablet (325 mg total) by mouth daily. 30 tablet 11  . atorvastatin (LIPITOR) 80 MG tablet Take 1 tablet (80 mg total) by mouth daily at 6 PM. 30 tablet 0  . Blood Glucose Monitoring Suppl (ACCU-CHEK AVIVA PLUS) w/Device KIT Use as directed twice a day.  Dx Code: E11.9 1 kit 0  . fluticasone (  FLONASE) 50 MCG/ACT nasal spray Place 2 sprays into both nostrils daily. 16 g 1  . glucose blood (ACCU-CHEK AVIVA PLUS) test strip Use as directed twice a day.  Dx Code: E11.9 100 each 1  . Insulin Pen Needle (B-D UF III MINI PEN NEEDLES) 31G X 5 MM MISC To use with insulin 100 each 2  . linagliptin (TRADJENTA) 5 MG TABS tablet Take 1 tablet (5 mg total) by mouth daily. 30 tablet 11  . lisinopril (PRINIVIL,ZESTRIL) 40 MG tablet Take 1 tablet (40 mg total) by mouth daily. 90 tablet 1  . pioglitazone (ACTOS) 30 MG tablet Take 1 tablet (30 mg total) by mouth daily. 30 tablet 2  . prednisoLONE acetate (PRED FORTE) 1 % ophthalmic suspension Place 1 drop into both eyes 2 (two) times daily.     . solifenacin (VESICARE) 10 MG tablet Take 1 tablet (10 mg total) by mouth daily. 30 tablet 2   No current facility-administered medications on file prior to visit.     Allergies    Allergen Reactions  . Metformin And Related Diarrhea    Family History  Problem Relation Age of Onset  . Diabetes Father   . Hypertension Father   . Stroke Father   . Diabetes Paternal Uncle   . Stroke Paternal Uncle   . Stroke Brother   . Cancer Neg Hx   . Breast cancer Neg Hx   . Ovarian cancer Neg Hx     BP 122/72   Pulse 68   Wt 203 lb 12.8 oz (92.4 kg)   SpO2 96%   BMI 29.24 kg/m    Review of Systems She denies hypoglycemia    Objective:   Physical Exam VITAL SIGNS:  See vs page GENERAL: no distress Pulses: dorsalis pedis intact bilat.   MSK: no deformity of the feet CV: no leg edema Skin:  no ulcer on the feet.  normal color and temp on the feet. Neuro: sensation is intact to touch on the feet  A1c=6.6%  Lab Results  Component Value Date   CREATININE 1.23 (H) 05/02/2017   BUN 17 05/02/2017   NA 135 05/02/2017   K 3.4 (L) 05/02/2017   CL 102 05/02/2017   CO2 22 05/02/2017       Assessment & Plan:  Insulin-requiring type 2 DM, with CVA's:  well-controlled Renal insuff: this limits rx options.   Patient Instructions  check your blood sugar twice a day.  vary the time of day when you check, between before the 3 meals, and at bedtime.  also check if you have symptoms of your blood sugar being too high or too low.  please keep a record of the readings and bring it to your next appointment here (or you can bring the meter itself).  You can write it on any piece of paper.  please call us sooner if your blood sugar goes below 70, or if you have a lot of readings over 200. Please continue the same medications Please come back for a follow-up appointment in 4 months.

## 2017-06-03 ENCOUNTER — Ambulatory Visit: Payer: Medicaid Other | Attending: Family Medicine

## 2017-06-03 DIAGNOSIS — I69318 Other symptoms and signs involving cognitive functions following cerebral infarction: Secondary | ICD-10-CM | POA: Insufficient documentation

## 2017-06-03 DIAGNOSIS — R278 Other lack of coordination: Secondary | ICD-10-CM | POA: Diagnosis present

## 2017-06-03 DIAGNOSIS — R471 Dysarthria and anarthria: Secondary | ICD-10-CM | POA: Insufficient documentation

## 2017-06-03 DIAGNOSIS — M6281 Muscle weakness (generalized): Secondary | ICD-10-CM | POA: Diagnosis present

## 2017-06-03 DIAGNOSIS — R2689 Other abnormalities of gait and mobility: Secondary | ICD-10-CM | POA: Diagnosis present

## 2017-06-03 DIAGNOSIS — M25611 Stiffness of right shoulder, not elsewhere classified: Secondary | ICD-10-CM | POA: Insufficient documentation

## 2017-06-03 DIAGNOSIS — R2681 Unsteadiness on feet: Secondary | ICD-10-CM | POA: Insufficient documentation

## 2017-06-03 DIAGNOSIS — I69351 Hemiplegia and hemiparesis following cerebral infarction affecting right dominant side: Secondary | ICD-10-CM | POA: Insufficient documentation

## 2017-06-03 DIAGNOSIS — R1312 Dysphagia, oropharyngeal phase: Secondary | ICD-10-CM | POA: Insufficient documentation

## 2017-06-03 DIAGNOSIS — R293 Abnormal posture: Secondary | ICD-10-CM | POA: Insufficient documentation

## 2017-06-03 DIAGNOSIS — I69354 Hemiplegia and hemiparesis following cerebral infarction affecting left non-dominant side: Secondary | ICD-10-CM | POA: Insufficient documentation

## 2017-06-03 DIAGNOSIS — R29898 Other symptoms and signs involving the musculoskeletal system: Secondary | ICD-10-CM | POA: Diagnosis present

## 2017-06-03 DIAGNOSIS — R41841 Cognitive communication deficit: Secondary | ICD-10-CM

## 2017-06-03 NOTE — Therapy (Signed)
[facility] [address] [place], [place], [address] Phone: [phone]   Fax:  [phone]  Speech Language Pathology Treatment  Patient Details  Name: Stephanie Lloyd MRN: [id number] Date of Birth: 1970-06-09 Referring Provider: Marvel Plan, MD   Encounter Date: 06/03/2017  End of Session - 06/03/17 2322    Visit Number  2    Number of Visits  18    Date for SLP Re-Evaluation  08/15/17    Authorization Type  medicaid    Authorization Time Period  06-02-17 to ?    Authorization - Visit Number  1    Authorization - Number of Visits  3    SLP Start Time  1150    SLP Stop Time   1230    SLP Time Calculation (min)  40 min    Activity Tolerance  Patient tolerated treatment well       Past Medical History:  Diagnosis Date  . Diabetes (H[facility])   . Diabetes mellitus without complication (HCC)   . Diastolic dysfunction   . Hypertension   . Immune deficiency disorder (H[facility])   . Menometrorrhagia 04/01/2016  . Stroke [facility])     Past Surgical History:  Procedure Laterality Date  . NO PAST SURGERIES      There were no vitals filed for this visit.  Subjective Assessment - 06/03/17 2320    Subjective  Pt intelligibility WFL from walk from lobby to ST room.    Currently in Pain?  No/denies            ADULT SLP TREATMENT - 06/03/17 1158      General Information   Behavior/Cognition  Alert;Cooperative;Pleasant mood      Treatment Provided   Treatment provided  Cognitive-Linquistic      Cognitive-Linquistic Treatment   Treatment focused on  Dysarthria    Skilled Treatment  SLP targeted exercises for strengthening pt's oral articulators, and also copmensatory strategies for dysarthria for improving pt's intelligibility. Pt req'd rare min A for oral strengthening HEP and rare min A for slowing rate      Assessment / Recommendations / Plan   Plan  Continue with current plan of care      Progression Toward Goals   Progression toward goals  Progressing toward goals       SLP Education - 06/03/17 2322    Education provided  Yes    Education Details  dysarthria compensations    Person(s) Educated  Patient    Methods  Explanation       SLP Short Term Goals - 06/03/17 2334      SLP SHORT TERM GOAL #1   Title  pt will complete dysarthria (intelligibility) HEP with occasional min A    Baseline  mod A occasionally    Time  4    Period  Weeks    Status  On-going      SLP SHORT TERM GOAL #2   Title  pt will complete oral motor HEP with occasional min A    Baseline  occasional mod A    Time  4    Period  Weeks    Status  On-going      SLP SHORT TERM GOAL #3   Title  pt will demo dysarthria compensations in 10/20 sentences    Baseline  not attempted yet    Time  4    Period  Weeks    Status  On-going  SLP Long Term Goals - 06/03/17 2335      SLP LONG TERM GOAL #1   Title  pt will complete HEP for dysarthria (intelligibility) with rare min A     Baseline  occasional mod A    Time  8    Period  Weeks or 17 total visits, for all LTGs    Status  On-going      SLP LONG TERM GOAL #2   Title  Pt will utilize compensations for dysarthria in simple conversation over 5 minutes with rare min A    Baseline  pt not utilizing compensations for dysarthria    Time  8    Period  Weeks    Status  On-going       Plan - 06/03/17 2333    Clinical Impression Statement  Pt with cont'd dysarthria (now more notable with pt fatigue). Pt did well today with HEP as well as compensations. Suspect approx 4 weeks total therapy.  Pt would benefit from skillled ST focusing on oral motor exercises to incr oral articulator strength, as well as compensations for dysarthria. Pt endorses cont'd rare dysphagia however does not desire modified barium swallow at this time. SLP reviewed pt's swallow precautions from her last swallow exam in February 2018.    Speech Therapy Frequency  1x /week for three weeks, then  twice a week for 7 weeks, or 18 total visits    Duration  -- 8 weeks or 17 visits    Treatment/Interventions  Oral motor exercises;Pharyngeal strengthening exercises;Diet toleration management by SLP;Trials of upgraded texture/liquids;Internal/external aids;Multimodal communcation approach;Compensatory strategies;Patient/family education;Functional tasks;SLP instruction and feedback may include any combination or all    Potential to Achieve Goals  Fair    Potential Considerations  Previous level of function;Severity of impairments    SLP Home Exercise Plan  provided today    Consulted and Agree with Plan of Care  Patient       Patient will benefit from skilled therapeutic intervention in order to improve the following deficits and impairments:   Dysarthria and anarthria  Cognitive communication deficit  Dysphagia, oropharyngeal phase    Problem List Patient Active Problem List   Diagnosis Date Noted  . History of intracranial hemorrhage 05/12/2017  . Facial droop 04/18/2017  . CKD (chronic kidney disease), stage III (HCC) 04/18/2017  . Anxiety 01/16/2017  . Leg cramps 01/16/2017  . Hypoglycemia due to insulin 08/29/2016  . Diverticulosis 08/29/2016  . Diverticulitis 08/29/2016  . 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 OSA (obstructive sleep apnea) 06/10/2016  . Iron deficiency anemia due to chronic blood loss 04/01/2016  . Menometrorrhagia 04/01/2016  . Upper respiratory infection 03/25/2016  . History of stroke   . History of intracerebral hemorrhage without residual deficit   . Cerebrovascular accident (CVA) due to thrombosis of basilar artery (HCC) 02/28/2016  . Ischemic stroke (HCC) 02/28/2016  . Acute kidney injury (HCC) 10/28/2015  . Acute encephalopathy 10/28/2015  . Diastolic dysfunction   . Stroke (cerebrum) (HCC)   . Focal infarction of brain (HCC) 10/08/2015  . Syncope 08/19/2015  . Hypertensive emergency  07/29/2015  . Hypertensive urgency 07/28/2015  . Hypokalemia 07/28/2015  . Accelerated hypertension 09/21/2014  . Type 2 diabetes mellitus with other circulatory complications (HCC) 09/21/2014  . Hyperlipidemia 09/21/2014  . 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 Hyperlipidemia LDL goal <70 07/29/2014  . Hemorrhagic stroke (HCC) 06/22/2014  . ICH (intracerebral hemorrhage) (HCC) 06/22/2014  .  Essential hypertension 05/13/2012    [facility] ,MS, CCC-SLP  06/03/2017, 11:36 PM  [facility] [address] [place], [place], [address] Phone: [phone]   Fax:  [phone]   Name: MYRAH STRAWDERMAN MRN: [id number] Date of Birth: 24-Feb-1970

## 2017-06-06 ENCOUNTER — Encounter: Payer: Self-pay | Admitting: Physical Therapy

## 2017-06-06 ENCOUNTER — Ambulatory Visit: Payer: Medicaid Other | Admitting: Occupational Therapy

## 2017-06-06 ENCOUNTER — Ambulatory Visit: Payer: Medicaid Other | Admitting: Physical Therapy

## 2017-06-06 ENCOUNTER — Other Ambulatory Visit: Payer: Self-pay | Admitting: Family Medicine

## 2017-06-06 DIAGNOSIS — I69351 Hemiplegia and hemiparesis following cerebral infarction affecting right dominant side: Secondary | ICD-10-CM

## 2017-06-06 DIAGNOSIS — R2689 Other abnormalities of gait and mobility: Secondary | ICD-10-CM

## 2017-06-06 DIAGNOSIS — R2681 Unsteadiness on feet: Secondary | ICD-10-CM

## 2017-06-06 DIAGNOSIS — R278 Other lack of coordination: Secondary | ICD-10-CM

## 2017-06-06 DIAGNOSIS — M25611 Stiffness of right shoulder, not elsewhere classified: Secondary | ICD-10-CM

## 2017-06-06 DIAGNOSIS — I10 Essential (primary) hypertension: Secondary | ICD-10-CM

## 2017-06-06 DIAGNOSIS — M6281 Muscle weakness (generalized): Secondary | ICD-10-CM

## 2017-06-06 DIAGNOSIS — R471 Dysarthria and anarthria: Secondary | ICD-10-CM | POA: Diagnosis not present

## 2017-06-06 DIAGNOSIS — R293 Abnormal posture: Secondary | ICD-10-CM

## 2017-06-06 DIAGNOSIS — R29898 Other symptoms and signs involving the musculoskeletal system: Secondary | ICD-10-CM

## 2017-06-06 DIAGNOSIS — I69318 Other symptoms and signs involving cognitive functions following cerebral infarction: Secondary | ICD-10-CM

## 2017-06-06 DIAGNOSIS — I69354 Hemiplegia and hemiparesis following cerebral infarction affecting left non-dominant side: Secondary | ICD-10-CM

## 2017-06-06 NOTE — Therapy (Signed)
[facility] [address] [place], [place], [address] Phone: [phone]   Fax:  [phone]  Physical Therapy Treatment  Patient Details  Name: Stephanie Lloyd MRN: [id number] Date of Birth: [birth year] Referring Provider: Marvel Plan, MD Neurologist Donato Schultz, DO PCP)   Encounter Date: 06/06/2017  PT End of Session - 06/06/17 0935    Visit Number  2    Number of Visits  10    Authorization Type  Medicaid    Authorization Time Period   06/03/17-06/23/17 for 3 visits    Authorization - Visit Number  1    Authorization - Number of Visits  3    PT Start Time  0934    PT Stop Time  1015    PT Time Calculation (min)  41 min    Equipment Utilized During Treatment  Gait belt    Activity Tolerance  Patient tolerated treatment well;No increased pain    Behavior During Therapy  [facility] for tasks assessed/performed;Flat affect       Past Medical History:  Diagnosis Date  . Diabetes (HCC)   . Diabetes mellitus without complication (HCC)   . Diastolic dysfunction   . Hypertension   . Immune deficiency disorder (HCC)   . Menometrorrhagia 04/01/2016  . Stroke [facility])     Past Surgical History:  Procedure Laterality Date  . NO PAST SURGERIES      There were no vitals filed for this visit.  Subjective Assessment - 06/06/17 0935    Subjective  No new complaints. No fall or pain to report.     Pertinent History  hx of CVA/ICH 03/2017, 01/2016, 09/2015 & 05/2014, HTN, DM2, polyneuropathy,  facial droop, CKD st III,     Limitations  Lifting;Walking;House hold activities;Standing    Patient Stated Goals  Be able to walk better & faster, do more activities    Currently in Pain?  No/denies        Treatment Issued the following to pt's HEP. Min guard assist for balance with cues on posture and ex form/technique.   Sit to Stand / Stand to Sit / Transfers    Sit on edge of a solid chair with arms, feet flat on floor. Lean  forward over feet and stand up with hands on chair arms. Sit down slowly with hands on chair arms. Progress to not using hands (having hands resting on knees). Repeat _10_ times per session. Do _1-2_ sessions per day.  Copyright  VHI. All rights reserved.   Perform these at the counter or sturdy table for balance assist as needed:   Toe / Heel Raise (Standing)    Standing with support, raise heels up and hold for 5 seconds,  then raise toes up and hold for 5 seconds.  Repeat _10_ raises with each. 1-2 times a day.  Copyright  VHI. All rights reserved.   Side-Stepping    Holding as needed for balance: step sideways toward one direction until a the end of the counter/table. Then side step back to starting point. Keep toes and hips facing the counter/table, do not let them rotate out to the side.   Perform 3 laps toward each direction. 1-2 times a day. Copyright  VHI. All rights reserved.   "I love a Parade" Lift    At counter/table: perform forward walking marches, holding the knee in the air for 3-5 seconds each time. When at the end of the table/counter: march slowly backwards, still holding  the knee in the air for 3-5 seconds each time.   Perform 3 laps each forward/backwards. 1-2 times a day.  http://gt2.exer.us/345    Perform these in the corner with a chair in front for safety:    Copyright  VHI. All rights reserved.  Feet Together (Compliant Surface) Varied Arm Positions - Eyes Closed    Stand on compliant surface: _pillow/s_ with feet together and arms at sides. Close eyes and visualize upright position. Hold_30_ seconds. Repeat _3_ times per session. Do _1-2_ sessions per day.  Copyright  VHI. All rights reserved.   Feet Apart (Compliant Surface) Head Motion - Eyes Closed    Stand on compliant surface: _pillow/s_ with feet shoulder width apart. Close eyes and move head slowly: 1. Up and down x 10 reps 2. Left and right x 10 reps  Do __1-2_  sessions per day.    Copyright  VHI. All rights reserved.  Single Leg - Eyes Open    Holding support, lift right leg while balancing on left leg. Progress to removing hands from support surface for longer periods of time. Hold__20__ seconds.  Switch legs: lift left leg while balancing on right leg. Progress to letting go of support as needed. Hold for 20 seconds.   Perform 3 reps standing on each leg.  Do _1-2_ sessions per day.  Copyright  VHI. All rights reserved.      PT Education - 06/06/17 1014    Education provided  Yes    Education Details  established HEP to address LE strengthening and balance    Person(s) Educated  Patient    Methods  Explanation;Demonstration;Verbal cues;Handout    Comprehension  Verbalized understanding;Returned demonstration       PT Short Term Goals - 05/26/17 1515      PT SHORT TERM GOAL #1   Title  Patient demonstrates understanding of initial HEP. (All STGs Target Date 06/20/2017)    Baseline  Patient is dependent in appropriate exercises with medical issues.     Time  3    Period  Weeks    Status  New    Target Date  06/20/17      PT SHORT TERM GOAL #2   Title  Patient standing balance reaches 5" anteriorly & to floor without UE support with supervision.     Baseline  Patient requires minA to reach 5" or to floor without UE support.     Time  3    Period  Weeks    Status  New    Target Date  06/20/17      PT SHORT TERM GOAL #3   Title  Patient ambulates 200' without device with supervision.     Baseline  Patient ambulates 100' without device with min guard.     Time  3    Period  Weeks    Status  New    Target Date  06/20/17        PT Long Term Goals - 05/26/17 1523      PT LONG TERM GOAL #1   Title  Patient demonstrates & verbalizes understanding of ongoing HEP.    Baseline  Patient is dependent with appropriate exercises with medical conditions.     Time  3    Period  Months    Status  New      PT LONG TERM  GOAL #2   Title  Berg Balance >45/56 to indicate lower fall risk.     Baseline  Berg Balance  34/56    Time  3    Period  Months    Status  New      PT LONG TERM GOAL #3   Title  Timed Up-Go standard <13.5seconds and cognitive TUG <17.5seconds to indicate lower fall risk.     Baseline  std TUG 18.02sec and cognitive TUG 21.17    Time  3    Period  Months    Status  New      PT LONG TERM GOAL #4   Title  Functional Gait Assessment >/= 15/30    Baseline  FGA 7/30    Time  3    Period  Months    Status  New      PT LONG TERM GOAL #5   Title  Gait Velocity >2.00 ft/sec to indicate improved functional mobility.    Baseline  Gait Velocity 1.53 ft/sec    Time  3    Period  Months    Status  New            Plan - 06/06/17 0936    Rehab Potential  Good    Clinical Impairments Affecting Rehab Potential  lives with husband who has disability and father who has dementia,     PT Frequency  1x / week 9 visits over 3 months    PT Duration  Other (comment) 9 visits over 3 months    PT Treatment/Interventions  ADLs/Self Care Home Management;DME Instruction;Gait training;Stair training;Functional mobility training;Therapeutic activities;Therapeutic exercise;Balance training;Neuromuscular re-education;Patient/family education;Passive range of motion;Vestibular;Canalith Repostioning    PT Next Visit Plan  --    Consulted and Agree with Plan of Care  Patient       Patient will benefit from skilled therapeutic intervention in order to improve the following deficits and impairments:  Abnormal gait, Decreased activity tolerance, Decreased balance, Decreased coordination, Decreased endurance, Decreased mobility, Decreased range of motion, Decreased strength, Impaired flexibility, Postural dysfunction  Visit Diagnosis: Abnormal posture  Hemiplegia and hemiparesis following cerebral infarction affecting right dominant side (HCC)  Muscle weakness (generalized)  Unsteadiness on feet  Other  abnormalities of gait and mobility  Other lack of coordination  Other symptoms and signs involving the musculoskeletal system     Problem List Patient Active Problem List   Diagnosis Date Noted  . History of intracranial hemorrhage 05/12/2017  . Facial droop 04/18/2017  . CKD (chronic kidney disease), stage III (HCC) 04/18/2017  . Anxiety 01/16/2017  . Leg cramps 01/16/2017  . Hypoglycemia due to insulin 08/29/2016  . Diverticulosis 08/29/2016  . Diverticulitis 08/29/2016  . 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 OSA (obstructive sleep apnea) 06/10/2016  . Iron deficiency anemia due to chronic blood loss 04/01/2016  . Menometrorrhagia 04/01/2016  . Upper respiratory infection 03/25/2016  . History of stroke   . History of intracerebral hemorrhage without residual deficit   . Cerebrovascular accident (CVA) due to thrombosis of basilar artery (HCC) 02/28/2016  . Ischemic stroke (HCC) 02/28/2016  . Acute kidney injury (HCC) 10/28/2015  . Acute encephalopathy 10/28/2015  . Diastolic dysfunction   . Stroke (cerebrum) (HCC)   . Focal infarction of brain (HCC) 10/08/2015  . Syncope 08/19/2015  . Hypertensive emergency 07/29/2015  . Hypertensive urgency 07/28/2015  . Hypokalemia 07/28/2015  . Accelerated hypertension 09/21/2014  . Type 2 diabetes mellitus with other circulatory complications (HCC) 09/21/2014  . Hyperlipidemia 09/21/2014  . 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 Hyperlipidemia LDL goal <70  07/29/2014  . Hemorrhagic stroke (HCC) 06/22/2014  . ICH (intracerebral hemorrhage) (HCC) 06/22/2014  . Essential hypertension 05/13/2012    Sallyanne Kuster, PTA, [facility] [address], [address] [place], [place] [address] [phone] 06/07/17, 7:02 PM   Name: Stephanie Lloyd MRN: [id number] Date of Birth: 1970/05/28

## 2017-06-06 NOTE — Patient Instructions (Addendum)
Sit to Stand / Stand to Sit / Transfers    Sit on edge of a solid chair with arms, feet flat on floor. Lean forward over feet and stand up with hands on chair arms. Sit down slowly with hands on chair arms. Progress to not using hands (having hands resting on knees). Repeat _10_ times per session. Do _1-2_ sessions per day.  Copyright  VHI. All rights reserved.   Perform these at the counter or sturdy table for balance assist as needed:   Toe / Heel Raise (Standing)    Standing with support, raise heels up and hold for 5 seconds,  then raise toes up and hold for 5 seconds.  Repeat _10_ raises with each. 1-2 times a day.  Copyright  VHI. All rights reserved.   Side-Stepping    Holding as needed for balance: step sideways toward one direction until a the end of the counter/table. Then side step back to starting point. Keep toes and hips facing the counter/table, do not let them rotate out to the side.   Perform 3 laps toward each direction. 1-2 times a day. Copyright  VHI. All rights reserved.   "I love a Parade" Lift    At counter/table: perform forward walking marches, holding the knee in the air for 3-5 seconds each time. When at the end of the table/counter: march slowly backwards, still holding the knee in the air for 3-5 seconds each time.   Perform 3 laps each forward/backwards. 1-2 times a day.  http://gt2.exer.us/345    Perform these in the corner with a chair in front for safety:    Copyright  VHI. All rights reserved.  Feet Together (Compliant Surface) Varied Arm Positions - Eyes Closed    Stand on compliant surface: _pillow/s_ with feet together and arms at sides. Close eyes and visualize upright position. Hold_30_ seconds. Repeat _3_ times per session. Do _1-2_ sessions per day.  Copyright  VHI. All rights reserved.   Feet Apart (Compliant Surface) Head Motion - Eyes Closed    Stand on compliant surface: _pillow/s_ with feet shoulder width  apart. Close eyes and move head slowly: 1. Up and down x 10 reps 2. Left and right x 10 reps  Do __1-2_ sessions per day.    Copyright  VHI. All rights reserved.  Single Leg - Eyes Open    Holding support, lift right leg while balancing on left leg. Progress to removing hands from support surface for longer periods of time. Hold__20__ seconds.  Switch legs: lift left leg while balancing on right leg. Progress to letting go of support as needed. Hold for 20 seconds.   Perform 3 reps standing on each leg.  Do _1-2_ sessions per day.  Copyright  VHI. All rights reserved.

## 2017-06-06 NOTE — Therapy (Signed)
[facility] [address] [place], [place], [address] Phone: [phone]   Fax:  [phone]  Occupational Therapy Evaluation  Patient Details  Name: Stephanie Lloyd MRN: [id number] Date of Birth: 1970/08/12 Referring Provider: Marvel Plan (neurologist)   Encounter Date: 06/06/2017  OT End of Session - 06/06/17 1116    Visit Number  1    Number of Visits  9    Date for OT Re-Evaluation  08/06/17    Authorization Type  MCD - Awaiting approval    OT Start Time  1015    OT Stop Time  1105    OT Time Calculation (min)  50 min    Activity Tolerance  Patient tolerated treatment well       Past Medical History:  Diagnosis Date  . Diabetes (HCC)   . Diabetes mellitus without complication (HCC)   . Diastolic dysfunction   . Hypertension   . Immune deficiency disorder (HCC)   . Menometrorrhagia 04/01/2016  . Stroke [facility])     Past Surgical History:  Procedure Laterality Date  . NO PAST SURGERIES      There were no vitals filed for this visit.  Subjective Assessment - 06/06/17 1018    Pertinent History  Recent CVA 04/17/17. PMH: 05/2014 (affecting Lt side), 09/2015 (affecting Rt side), 01/2016 (both sides, Rt worse), HTN, DM2, Polyneuropathy, CKD stage III    Limitations  fall risk, no driving    Patient Stated Goals  folding laundry, writing     Currently in Pain?  No/denies        [facility] OT Assessment - 06/06/17 0001      Assessment   Medical Diagnosis  Ischemic CVA    Referring Provider  Marvel Plan (neurologist)    Onset Date/Surgical Date  04/17/17    Hand Dominance  Right    Prior Therapy  no HH or OP therapies in 2019.       Precautions   Precautions  Fall    Precaution Comments  no driving      Balance Screen   Has the patient fallen in the past 6 months  No    Has the patient had a decrease in activity level because of a fear of falling?   -- see P.T. evaluation      Home  Environment   Bathroom  Shower/Tub  Tub/Shower unit;Curtain    Home Equipment  Shower seat;Hand held Careers information officer - 2 wheels    Additional Comments  Lives in one level home with ramped entrance. Husband has sickle cell and father has dementia.     Lives With  Spouse and father      Prior Function   Level of Independence  Independent with basic ADLs supervision for cooking, independent for light cleaning     Vocation  -- applying for disability      ADL   Eating/Feeding  Needs assist with cutting food eating w/ dominant Rt hand    Grooming  Minimal assistance assist to brush hair    Upper Body Bathing  Modified independent    Lower Body Bathing  Modified independent    Upper Body Dressing  Independent    Lower Body Dressing  Modified independent leaving shoes untied and tucked    Toilet Transfer  Modified independent    Toileting - Clothing Manipulation  Modified independent    Toileting -  Hygiene  Independent    Tub/Shower Transfer  Modified independent ? safety -  sometimes calls husband for help    ADL comments  does not use shower chair b/c she reports shower is too small - therapist recommends grab bar.      IADL   Shopping  Needs to be accompanied on any shopping trip    Light Housekeeping  Performs light daily tasks such as dishwashing, bed making washing clothes, but cannot fold clothes currently    Meal Prep  Able to complete simple cold meal and snack prep;Able to complete simple warm meal prep    Community Mobility  Relies on family or friends for transportation    Medication Management  Is responsible for taking medication in correct dosages at correct time    Financial Management  -- Pt's aunt is taking care of all bills      Mobility   Mobility Status  Independent      Written Expression   Dominant Hand  Right    Handwriting  100% legible name only, print      Vision - History   Baseline Vision  Wears glasses all the time    Additional Comments  denies changes in vision from this  stroke      Cognition   Overall Cognitive Status  History of cognitive impairments - at baseline from previous strokes (chronic ischemic changes) - see recent MRI      Sensation   Light Touch  Appears Intact    Additional Comments  intact for light touch, localization and 2 pt discrimination bilateral hands.       Coordination   9 Hole Peg Test  Right;Left    Right 9 Hole Peg Test  Rt = 35.22 sec ( 32.47 sec. at d/c last year)    Left 9 Hole Peg Test  Lt = 41.53 sec (34.60 sec. at d/c last year)       ROM / Strength   AROM / PROM / Strength  AROM      AROM   Overall AROM Comments  LUE AROM WNL's. RUE Elbow distally WNL's. Rt shoulder flex to approx 100*, abduction to approx 90*, IR/ER approx 75% all d/t frozen shoulder per pt report.       PROM   Overall PROM Comments  unable to passively stretch Rt shoulder past midrange seated d/t frozen shoulder      Hand Function   Right Hand Grip (lbs)  46 lbs ( 55 lbs at d/c last year)     Left Hand Grip (lbs)  51 lbs (65 lbs at d/c last year)                            OT Long Term Goals - 06/06/17 1123      OT LONG TERM GOAL #1   Title  Independent with coordination HEP and putty HEP for bilateral hands    Baseline  Issued on previous admission but has not been doing since last year, will need review/updates    Time  8    Period  Weeks    Status  New      OT LONG TERM GOAL #2   Title  Improve coordination bilaterally as evidenced by reducing speed on 9 hole peg test by 5 sec. or more    Baseline  eval: Rt = 35.22 sec. sec, Lt = 41.53 sec    Time  8    Period  Weeks    Status  New  OT LONG TERM GOAL #3   Title  Grip strength to improve bilaterally by 5 lbs or greater to open jars/containers    Baseline  eval: RT = 46 lbs, Lt = 51 lbs    Time  8    Period  Weeks    Status  New      OT LONG TERM GOAL #4   Title  Pt to return to folding laundry with BUE's    Baseline  dependent    Time  8    Period   Weeks    Status  New      OT LONG TERM GOAL #5   Title  Pt to demo increased Rt shoulder flexion to 115* or greater for overhead reaching     Baseline  100*    Time  8    Period  Weeks    Status  New      Long Term Additional Goals   Additional Long Term Goals  Yes      OT LONG TERM GOAL #6   Title  Pt to verbalize understanding with A/E options for tying shoes, writing, and cooking to increase ease, independence, and safety with these tasks    Baseline  dependent     Time  8    Period  Weeks    Status  New            Plan - 06/06/17 1117    Clinical Impression Statement  Pt is a 47 y.o. female who presents to outpatient O.T. for 5th CVA and hospitalized 04/17/17 - 04/20/17. She had an acute ischemic left periventricular white matter CVA. This CVA has increased deficits with RUE coordination and strength, but pt has also had decline in LUE strength and coordination since d/c from O.T. last year. Pt also has frozen shoulder Rt shoulder which decreases her ability to fold clothes and brush/wash hair.     Occupational Profile and client history currently impacting functional performance  PMH: h/o multiple CVA'S (05/2014, 09/2015, 01/2016), HTN, DM2, CKD stage III    Occupational performance deficits (Please refer to evaluation for details):  ADL's;IADL's;Leisure    OT Frequency  -- 8 visits over 8 weeks (plus evaluation)    OT Treatment/Interventions  Self-care/ADL training;Moist Heat;DME and/or AE instruction;Aquatic Therapy;Therapeutic activities;Therapeutic exercise;Cognitive remediation/compensation;Coping strategies training;Neuromuscular education;Functional Mobility Training;Passive range of motion;Manual Therapy;Patient/family education;Visual/perceptual remediation/compensation    Plan  re-issue coordination and putty HEP for bilateral hands, A/E options for tying shoes, HEP for Rt shoulder    Clinical Decision Making  Several treatment options, min-mod task modification necessary     Consulted and Agree with Plan of Care  Patient       Patient will benefit from skilled therapeutic intervention in order to improve the following deficits and impairments:  Decreased coordination, Decreased range of motion, Decreased coping skills, Decreased endurance, Decreased safety awareness, Decreased knowledge of precautions, Decreased activity tolerance, Impaired UE functional use, Decreased knowledge of use of DME, Decreased balance, Decreased cognition, Decreased mobility, Decreased strength, Impaired perceived functional ability, Impaired vision/preception  Visit Diagnosis: Hemiplegia and hemiparesis following cerebral infarction affecting right dominant side (HCC) - Plan: Ot plan of care cert/re-cert  Hemiplegia and hemiparesis following cerebral infarction affecting left non-dominant side (HCC) - Plan: Ot plan of care cert/re-cert  Other lack of coordination - Plan: Ot plan of care cert/re-cert  Muscle weakness (generalized) - Plan: Ot plan of care cert/re-cert  Stiffness of right shoulder, not elsewhere classified - Plan: Ot plan  of care cert/re-cert  Other symptoms and signs involving cognitive functions following cerebral infarction - Plan: Ot plan of care cert/re-cert  Unsteadiness on feet - Plan: Ot plan of care cert/re-cert    Problem List Patient Active Problem List   Diagnosis Date Noted  . History of intracranial hemorrhage 05/12/2017  . Facial droop 04/18/2017  . CKD (chronic kidney disease), stage III (HCC) 04/18/2017  . Anxiety 01/16/2017  . Leg cramps 01/16/2017  . Hypoglycemia due to insulin 08/29/2016  . Diverticulosis 08/29/2016  . Diverticulitis 08/29/2016  . 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 OSA (obstructive sleep apnea) 06/10/2016  . Iron deficiency anemia due to chronic blood loss 04/01/2016  . Menometrorrhagia 04/01/2016  . Upper respiratory infection 03/25/2016  . History of stroke   .  History of intracerebral hemorrhage without residual deficit   . Cerebrovascular accident (CVA) due to thrombosis of basilar artery (HCC) 02/28/2016  . Ischemic stroke (HCC) 02/28/2016  . Acute kidney injury (HCC) 10/28/2015  . Acute encephalopathy 10/28/2015  . Diastolic dysfunction   . Stroke (cerebrum) (HCC)   . Focal infarction of brain (HCC) 10/08/2015  . Syncope 08/19/2015  . Hypertensive emergency 07/29/2015  . Hypertensive urgency 07/28/2015  . Hypokalemia 07/28/2015  . Accelerated hypertension 09/21/2014  . Type 2 diabetes mellitus with other circulatory complications (HCC) 09/21/2014  . Hyperlipidemia 09/21/2014  . 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 Hyperlipidemia LDL goal <70 07/29/2014  . Hemorrhagic stroke (HCC) 06/22/2014  . ICH (intracerebral hemorrhage) (HCC) 06/22/2014  . Essential hypertension 05/13/2012    Kelli Churn, OTR/L 06/06/2017, 11:32 AM  [facility] [address] [place], [place], [address] Phone: [phone]   Fax:  [phone]  Name: Stephanie Lloyd MRN: [id number] Date of Birth: 04-13-1970

## 2017-06-12 ENCOUNTER — Ambulatory Visit: Payer: Medicaid Other

## 2017-06-12 ENCOUNTER — Ambulatory Visit: Payer: Medicaid Other | Admitting: Physical Therapy

## 2017-06-12 ENCOUNTER — Other Ambulatory Visit: Payer: Self-pay | Admitting: Family Medicine

## 2017-06-12 ENCOUNTER — Encounter: Payer: Self-pay | Admitting: Physical Therapy

## 2017-06-12 DIAGNOSIS — R2689 Other abnormalities of gait and mobility: Secondary | ICD-10-CM

## 2017-06-12 DIAGNOSIS — R471 Dysarthria and anarthria: Secondary | ICD-10-CM

## 2017-06-12 DIAGNOSIS — R2681 Unsteadiness on feet: Secondary | ICD-10-CM

## 2017-06-12 DIAGNOSIS — R1312 Dysphagia, oropharyngeal phase: Secondary | ICD-10-CM

## 2017-06-12 DIAGNOSIS — N3941 Urge incontinence: Secondary | ICD-10-CM

## 2017-06-12 DIAGNOSIS — M6281 Muscle weakness (generalized): Secondary | ICD-10-CM

## 2017-06-12 DIAGNOSIS — R293 Abnormal posture: Secondary | ICD-10-CM

## 2017-06-12 DIAGNOSIS — R41841 Cognitive communication deficit: Secondary | ICD-10-CM

## 2017-06-12 NOTE — Patient Instructions (Signed)
  Please complete the assigned speech therapy homework prior to your next session and return it to the speech therapist at your next visit.  

## 2017-06-12 NOTE — Therapy (Signed)
[facility] [address] [place], [place], [address] Phone: [phone]   Fax:  [phone]  Physical Therapy Treatment  Patient Details  Name: Stephanie Lloyd MRN: [id number] Date of Birth: [birth year] Referring Provider: Marvel Plan (neurologist)   Encounter Date: 06/12/2017  PT End of Session - 06/12/17 2152    Visit Number  3    Number of Visits  10    Authorization Type  Medicaid    Authorization Time Period   06/03/17-06/23/17 for 3 visits    Authorization - Visit Number  2    Authorization - Number of Visits  3    PT Start Time  1101    PT Stop Time  1144    PT Time Calculation (min)  43 min    Equipment Utilized During Treatment  Gait belt    Activity Tolerance  Patient tolerated treatment well;No increased pain    Behavior During Therapy  [facility] for tasks assessed/performed;Flat affect       Past Medical History:  Diagnosis Date  . Diabetes (HCC)   . Diabetes mellitus without complication (HCC)   . Diastolic dysfunction   . Hypertension   . Immune deficiency disorder (HCC)   . Menometrorrhagia 04/01/2016  . Stroke [facility])     Past Surgical History:  Procedure Laterality Date  . NO PAST SURGERIES      There were no vitals filed for this visit.  Subjective Assessment - 06/12/17 1103    Subjective  No falls. She has been doing PT exercises but is not sure that they are correct.    Pertinent History  hx of CVA/ICH 03/2017, 01/2016, 09/2015 & 05/2014, HTN, DM2, polyneuropathy,  facial droop, CKD st III,     Limitations  Lifting;Walking;House hold activities;Standing    Patient Stated Goals  Be able to walk better & faster, do more activities    Currently in Pain?  No/denies       Therapeutic Exercise: PT reviewed HEP using her handout. PT added notes to improve patient understanding.  SciFit recumbent stepper Level 2 with BLEs & BUEs 10 minutes while PT instructed pt in need to find community fitness option  to augment PT.  PT discussed options of [employer] (applying for scholarship for up to 50% off), [employer] with fitness room $10/month and [employer] which has SciFit. She reports that she has a friend with a Musician that she could go to [employer].                         PT Education - 06/12/17 1130    Education provided  Yes    Education Details  PT reviewed HEP (sit to/from stand, corner on pillow static EC feet together & head turns EC feet apart, crossovers, marching & single leg stance)    Person(s) Educated  Patient    Methods  Explanation;Demonstration;Tactile cues;Verbal cues;Handout    Comprehension  Verbalized understanding;Returned demonstration;Verbal cues required;Tactile cues required;Need further instruction       PT Short Term Goals - 06/12/17 2153      PT SHORT TERM GOAL #1   Title  Patient demonstrates understanding of initial HEP. (All STGs Target Date 06/20/2017)    Baseline  Patient is dependent in appropriate exercises with medical issues.     Time  3    Period  Weeks    Status  On-going    Target Date  06/20/17      PT SHORT TERM GOAL #2   Title  Patient standing balance reaches 5" anteriorly & to floor without UE support with supervision.     Baseline  Patient requires minA to reach 5" or to floor without UE support.     Time  3    Period  Weeks    Status  On-going    Target Date  06/20/17      PT SHORT TERM GOAL #3   Title  Patient ambulates 200' without device with supervision.     Baseline  Patient ambulates 100' without device with min guard.     Time  3    Period  Weeks    Status  On-going    Target Date  06/20/17        PT Long Term Goals - 06/12/17 2154      PT LONG TERM GOAL #1   Title  Patient demonstrates & verbalizes understanding of ongoing HEP.    Baseline  Patient is dependent with appropriate exercises with medical conditions.     Time  3    Period  Months    Status   On-going      PT LONG TERM GOAL #2   Title  Berg Balance >45/56 to indicate lower fall risk.     Baseline  Berg Balance 34/56    Time  3    Period  Months    Status  On-going      PT LONG TERM GOAL #3   Title  Timed Up-Go standard <13.5seconds and cognitive TUG <17.5seconds to indicate lower fall risk.     Baseline  std TUG 18.02sec and cognitive TUG 21.17    Time  3    Period  Months    Status  On-going      PT LONG TERM GOAL #4   Title  Functional Gait Assessment >/= 15/30    Baseline  FGA 7/30    Time  3    Period  Months    Status  On-going      PT LONG TERM GOAL #5   Title  Gait Velocity >2.00 ft/sec to indicate improved functional mobility.    Baseline  Gait Velocity 1.53 ft/sec    Time  3    Period  Months    Status  On-going            Plan - 06/12/17 2155    Clinical Impression Statement  Patient improved understanding of HEP with review & additional notes but will need further review due to her cognitive issues. Patient verbalizes understanding of PT recommendation to start looking for community fitness option.     Rehab Potential  Good    Clinical Impairments Affecting Rehab Potential  lives with husband who has disability and father who has dementia,     PT Frequency  1x / week 9 visits over 3 months    PT Duration  Other (comment) 9 visits over 3 months    PT Treatment/Interventions  ADLs/Self Care Home Management;DME Instruction;Gait training;Stair training;Functional mobility training;Therapeutic activities;Therapeutic exercise;Balance training;Neuromuscular re-education;Patient/family education;Passive range of motion;Vestibular;Canalith Repostioning    PT Next Visit Plan  check STGs and send reauthorization to Medicaid, Neuromuscular Re-education & gait training with dual tasks,    Consulted and Agree with Plan of Care  Patient       Patient will benefit from skilled therapeutic intervention in order to improve the following deficits and impairments:   Abnormal  gait, Decreased activity tolerance, Decreased balance, Decreased coordination, Decreased endurance, Decreased mobility, Decreased range of motion, Decreased strength, Impaired flexibility, Postural dysfunction  Visit Diagnosis: Muscle weakness (generalized)  Unsteadiness on feet  Abnormal posture  Other abnormalities of gait and mobility     Problem List Patient Active Problem List   Diagnosis Date Noted  . History of intracranial hemorrhage 05/12/2017  . Facial droop 04/18/2017  . CKD (chronic kidney disease), stage III (HCC) 04/18/2017  . Anxiety 01/16/2017  . Leg cramps 01/16/2017  . Hypoglycemia due to insulin 08/29/2016  . Diverticulosis 08/29/2016  . Diverticulitis 08/29/2016  . 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 OSA (obstructive sleep apnea) 06/10/2016  . Iron deficiency anemia due to chronic blood loss 04/01/2016  . Menometrorrhagia 04/01/2016  . Upper respiratory infection 03/25/2016  . History of stroke   . History of intracerebral hemorrhage without residual deficit   . Cerebrovascular accident (CVA) due to thrombosis of basilar artery (HCC) 02/28/2016  . Ischemic stroke (HCC) 02/28/2016  . Acute kidney injury (HCC) 10/28/2015  . Acute encephalopathy 10/28/2015  . Diastolic dysfunction   . Stroke (cerebrum) (HCC)   . Focal infarction of brain (HCC) 10/08/2015  . Syncope 08/19/2015  . Hypertensive emergency 07/29/2015  . Hypertensive urgency 07/28/2015  . Hypokalemia 07/28/2015  . Accelerated hypertension 09/21/2014  . Type 2 diabetes mellitus with other circulatory complications (HCC) 09/21/2014  . Hyperlipidemia 09/21/2014  . 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 Hyperlipidemia LDL goal <70 07/29/2014  . Hemorrhagic stroke (HCC) 06/22/2014  . ICH (intracerebral hemorrhage) (HCC) 06/22/2014  . Essential hypertension 05/13/2012    Vladimir Faster PT, DPT 06/12/2017, 9:59 PM  [facility] [address] [place], [place], [address] Phone: [phone]   Fax:  [phone]  Name: ANTOINETTA BERRONES MRN: [id number] Date of Birth: 1970-06-17

## 2017-06-12 NOTE — Therapy (Signed)
[facility] [address] [place], [place], [address] Phone: [phone]   Fax:  [phone]  Speech Language Pathology Treatment  Patient Details  Name: Stephanie Lloyd MRN: [id number] Date of Birth: [birth year] Referring Provider: Rosalin Hawking, MD   Encounter Date: 06/12/2017  End of Session - 06/12/17 1256    Visit Number  3    Number of Visits  18    Date for SLP Re-Evaluation  08/15/17    Authorization Type  medicaid    Authorization Time Period  06-02-17 to ?    Authorization - Visit Number  2    Authorization - Number of Visits  3    SLP Start Time  0102    SLP Stop Time   1230    SLP Time Calculation (min)  41 min    Activity Tolerance  Patient tolerated treatment well       Past Medical History:  Diagnosis Date  . Diabetes ([place])   . Diabetes mellitus without complication ([place])   . Diastolic dysfunction   . Hypertension   . Immune deficiency disorder ([place])   . Menometrorrhagia 04/01/2016  . Stroke [facility])     Past Surgical History:  Procedure Laterality Date  . NO PAST SURGERIES      There were no vitals filed for this visit.  Subjective Assessment - 06/12/17 1154    Subjective  "My father has to read my lips sometimes. He doesn't understand what I say." (father has some hearing impairment)    Currently in Pain?  No/denies            ADULT SLP TREATMENT - 06/12/17 1155      General Information   Behavior/Cognition  Alert;Cooperative;Pleasant mood      Treatment Provided   Treatment provided  Cognitive-Linquistic      Cognitive-Linquistic Treatment   Treatment focused on  Dysarthria    Skilled Treatment  Pt was independent with HEP for dysarthria (intelligibility). With HEP for oral motor strengthening, pt req'd SBA. She produced 18/20 sentences with with notable overarticulaiton. Pt benefitted from modeling cues when necessary.       Assessment / Recommendations / Plan   Plan  Continue  with current plan of care;Goals updated      Progression Toward Goals   Progression toward goals  Progressing toward goals       SLP Education - 06/12/17 1255    Education provided  Yes    Education Details  overarticulation in conversational speech outside [employer]) Educated  Patient    Methods  Explanation    Comprehension  Verbalized understanding       SLP Short Term Goals - 06/12/17 1155      SLP SHORT TERM GOAL #1   Title  pt will complete dysarthria (intelligibility) HEP with occasional min A    Baseline  mod A occasionally    Time  --    Period  --    Status  Achieved      SLP SHORT TERM GOAL #2   Title  pt will complete oral motor HEP with occasional min A    Baseline  occasional mod A    Time  --    Period  --    Status  Achieved      SLP SHORT TERM GOAL #3   Title  pt will demo dysarthria compensations in 10/20 sentences    Baseline  --    Time  --  Period  --    Status  Achieved      SLP SHORT TERM GOAL #4   Title  Pt will use dysarthria compensations at least 75% in a simple 5 minute conversation    Baseline  50% of the time in 5 minutes inside ST room    Time  3    Period  Weeks    Status  New       SLP Long Term Goals - 06/12/17 1216      [place] #1   Title  pt will complete HEP for dysarthria (intelligibility) with rare min A over 3 sessions    Baseline  occasional mod A;   06-12-17    Time  7    Period  Weeks or 17 total visits, for all LTGs    Status  Revised      SLP LONG TERM GOAL #2   Title  Pt will utilize compensations for dysarthria in simple conversation over 5 minutes with rare min A    Baseline  pt not utilizing compensations for dysarthria    Time  7    Period  Weeks    Status  On-going       Plan - 06/12/17 1256    Clinical Impression Statement  Pt with cont'd dysarthria (now more notable with pt fatigue). Pt met 3/3 short term goals today, and is performing very well with her HEPs. Additional short term  goal was added. She is slowly transferring compensatory measures for her dysarthria into her conversation.  Pt would benefit from cont'd skillled ST focusing on oral motor exercises to incr oral articulator strength, as well as compensations for dysarthria.    Speech Therapy Frequency  1x /week for three weeks, then twice a week for 7 weeks, or 18 total visits    Duration  -- 8 weeks or 17 visits    Treatment/Interventions  Oral motor exercises;Pharyngeal strengthening exercises;Diet toleration management by SLP;Trials of upgraded texture/liquids;Internal/external aids;Multimodal communcation approach;Compensatory strategies;Patient/family education;Functional tasks;SLP instruction and feedback may include any combination or all    Potential to Achieve Goals  Fair    Potential Considerations  Previous level of function;Severity of impairments    SLP Home Exercise Plan  provided today    Consulted and Agree with Plan of Care  Patient       Patient will benefit from skilled therapeutic intervention in order to improve the following deficits and impairments:   Dysarthria and anarthria  Dysphagia, oropharyngeal phase  Cognitive communication deficit    Problem List Patient Active Problem List   Diagnosis Date Noted  . History of intracranial hemorrhage 05/12/2017  . Facial droop 04/18/2017  . CKD (chronic kidney disease), stage III ([place]) 04/18/2017  . Anxiety 01/16/2017  . Leg cramps 01/16/2017  . Hypoglycemia due to insulin 08/29/2016  . Diverticulosis 08/29/2016  . Diverticulitis 08/29/2016  . 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 OSA (obstructive sleep apnea) 06/10/2016  . Iron deficiency anemia due to chronic blood loss 04/01/2016  . Menometrorrhagia 04/01/2016  . Upper respiratory infection 03/25/2016  . History of stroke   . History of intracerebral hemorrhage without residual deficit   . Cerebrovascular accident (CVA) due to  thrombosis of basilar artery ([place]) 02/28/2016  . Ischemic stroke ([place]) 02/28/2016  . Acute kidney injury ([place]) 10/28/2015  . Acute encephalopathy 10/28/2015  . Diastolic dysfunction   . Stroke (cerebrum) ([place])   . Focal infarction of  brain ([place]) 10/08/2015  . Syncope 08/19/2015  . Hypertensive emergency 07/29/2015  . Hypertensive urgency 07/28/2015  . Hypokalemia 07/28/2015  . Accelerated hypertension 09/21/2014  . Type 2 diabetes mellitus with other circulatory complications ([place]) 32/67/1245  . Hyperlipidemia 09/21/2014  . Thalamic hemorrhage with stroke ([place]) 07/29/2014  . Hyperlipidemia LDL goal <70 07/29/2014  . Hemorrhagic stroke ([place]) 06/22/2014  . ICH (intracerebral hemorrhage) ([place]) 06/22/2014  . Essential hypertension 05/13/2012    [facility] ,[place], [place]  06/12/2017, 1:00 PM  [facility] [address] [place], [place], [address] Phone: [phone]   Fax:  [phone]   Name: SYMPHANI ECKSTROM MRN: [id number] Date of Birth: 04/21/1970

## 2017-06-16 ENCOUNTER — Encounter: Payer: Self-pay | Admitting: Speech Pathology

## 2017-06-17 ENCOUNTER — Ambulatory Visit: Payer: Medicaid Other | Admitting: Speech Pathology

## 2017-06-17 ENCOUNTER — Other Ambulatory Visit: Payer: Self-pay | Admitting: *Deleted

## 2017-06-17 ENCOUNTER — Encounter: Payer: Self-pay | Admitting: Speech Pathology

## 2017-06-17 ENCOUNTER — Ambulatory Visit: Payer: Medicaid Other | Admitting: Physical Therapy

## 2017-06-17 ENCOUNTER — Ambulatory Visit: Payer: Medicaid Other

## 2017-06-17 ENCOUNTER — Encounter: Payer: Self-pay | Admitting: Physical Therapy

## 2017-06-17 DIAGNOSIS — M6281 Muscle weakness (generalized): Secondary | ICD-10-CM

## 2017-06-17 DIAGNOSIS — R471 Dysarthria and anarthria: Secondary | ICD-10-CM

## 2017-06-17 DIAGNOSIS — R2689 Other abnormalities of gait and mobility: Secondary | ICD-10-CM

## 2017-06-17 DIAGNOSIS — R293 Abnormal posture: Secondary | ICD-10-CM

## 2017-06-17 DIAGNOSIS — R2681 Unsteadiness on feet: Secondary | ICD-10-CM

## 2017-06-17 MED ORDER — ATORVASTATIN CALCIUM 80 MG PO TABS: 80.0000 mg | ORAL_TABLET | Freq: Every day | ORAL | 0 refills | Status: DC

## 2017-06-17 NOTE — Therapy (Signed)
[facility] [address] [place], [place], [address] Phone: [phone]   Fax:  [phone]  Speech Language Pathology Treatment  Patient Details  Name: Stephanie Lloyd MRN: [id number] Date of Birth: 02-29-1970 Referring Provider: Rosalin Hawking, MD   Encounter Date: 06/17/2017  End of Session - 06/17/17 1054    Visit Number  4    Number of Visits  18    Date for SLP Re-Evaluation  08/15/17    Authorization Type  medicaid    Authorization Time Period  06-02-17 to ?    Authorization - Visit Number  3    Authorization - Number of Visits  3    SLP Start Time  1030    SLP Stop Time   1100    SLP Time Calculation (min)  30 min    Activity Tolerance  Patient tolerated treatment well;No increased pain       Past Medical History:  Diagnosis Date  . Diabetes ([place])   . Diabetes mellitus without complication ([place])   . Diastolic dysfunction   . Hypertension   . Immune deficiency disorder ([place])   . Menometrorrhagia 04/01/2016  . Stroke [facility])     Past Surgical History:  Procedure Laterality Date  . NO PAST SURGERIES      There were no vitals filed for this visit.  Subjective Assessment - 06/17/17 1034    Subjective  "He asks me to repeat sometimes"; re: spouse    Currently in Pain?  No/denies            ADULT SLP TREATMENT - 06/17/17 1035      General Information   Behavior/Cognition  Alert;Cooperative;Pleasant mood      Cognitive-Linquistic Treatment   Treatment focused on  Dysarthria    Skilled Treatment  Pt independent with dysarthria HEP. Structured speech tasks generating sentences with multisyllabic words utilizing compensations with mod I. Simple conversation over 15 minutes with occasional min A to use compensations for dysarthria - pt self corrected her slurred speech 3x in spontaneous speech with mod I.       Assessment / Recommendations / Plan   Plan  Continue with current plan of care;Goals updated       Progression Toward Goals   Progression toward goals  Progressing toward goals         SLP Short Term Goals - 06/17/17 1053      SLP SHORT TERM GOAL #1   Title  pt will complete dysarthria (intelligibility) HEP with occasional min A    Baseline  mod A occasionally      SLP SHORT TERM GOAL #2   Title  pt will complete oral motor HEP with occasional min A    Baseline  occasional mod A    Time  4    Period  Weeks    Status  Achieved      SLP SHORT TERM GOAL #3   Title  pt will demo dysarthria compensations in 10/20 sentences    Baseline  not attempted yet    Time  4    Period  Weeks    Status  Achieved      SLP SHORT TERM GOAL #4   Title  Pt will use dysarthria compensations at least 75% in a simple 5 minute conversation    Baseline  50% of the time in 5 minutes inside ST room    Time  3    Period  Weeks  Status  On-going       SLP Long Term Goals - 06/17/17 1053      SLP LONG TERM GOAL #1   Title  pt will complete HEP for dysarthria (intelligibility) with rare min A over 3 sessions    Baseline  occasional mod A;   06-12-17    Time  6    Period  Weeks or 17 total visits, for all LTGs    Status  Revised      SLP LONG TERM GOAL #2   Title  Pt will utilize compensations for dysarthria in simple conversation over 5 minutes with rare min A    Baseline  pt not utilizing compensations for dysarthria    Time  6    Period  Weeks    Status  On-going       Plan - 06/17/17 1047    Clinical Impression Statement  Pt conitnues to demonstrate improvement in carryover of compensations for dysarthria to improve intellgilbility. She has met short term goals, however, she continues to require min A to carryover compensations to be intelligible during spontaneous conversation. Pt has improved error awareness and self corrects episodes of slurred speech (1-3 words) with min A. Pt will benefit from continuing skilled ST, including oral motor exercises and compensations for  dysarthria to maximize intellgilbity in a variety of environments.     Speech Therapy Frequency  1x /week    Treatment/Interventions  Oral motor exercises;Pharyngeal strengthening exercises;Diet toleration management by SLP;Trials of upgraded texture/liquids;Internal/external aids;Multimodal communcation approach;Compensatory strategies;Patient/family education;Functional tasks;SLP instruction and feedback    Potential to Achieve Goals  Fair    Potential Considerations  Previous level of function;Severity of impairments    Consulted and Agree with Plan of Care  Patient       Patient will benefit from skilled therapeutic intervention in order to improve the following deficits and impairments:   Dysarthria and anarthria    Problem List Patient Active Problem List   Diagnosis Date Noted  . History of intracranial hemorrhage 05/12/2017  . Facial droop 04/18/2017  . CKD (chronic kidney disease), stage III ([place]) 04/18/2017  . Anxiety 01/16/2017  . Leg cramps 01/16/2017  . Hypoglycemia due to insulin 08/29/2016  . Diverticulosis 08/29/2016  . Diverticulitis 08/29/2016  . 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 OSA (obstructive sleep apnea) 06/10/2016  . Iron deficiency anemia due to chronic blood loss 04/01/2016  . Menometrorrhagia 04/01/2016  . Upper respiratory infection 03/25/2016  . History of stroke   . History of intracerebral hemorrhage without residual deficit   . Cerebrovascular accident (CVA) due to thrombosis of basilar artery ([place]) 02/28/2016  . Ischemic stroke ([place]) 02/28/2016  . Acute kidney injury ([place]) 10/28/2015  . Acute encephalopathy 10/28/2015  . Diastolic dysfunction   . Stroke (cerebrum) ([place])   . Focal infarction of brain ([place]) 10/08/2015  . Syncope 08/19/2015  . Hypertensive emergency 07/29/2015  . Hypertensive urgency 07/28/2015  . Hypokalemia 07/28/2015  . Accelerated hypertension 09/21/2014  . Type 2  diabetes mellitus with other circulatory complications ([place]) 26/33/3545  . Hyperlipidemia 09/21/2014  . Thalamic hemorrhage with stroke ([place]) 07/29/2014  . Hyperlipidemia LDL goal <70 07/29/2014  . Hemorrhagic stroke ([place]) 06/22/2014  . ICH (intracerebral hemorrhage) ([place]) 06/22/2014  . Essential hypertension 05/13/2012    Lourdes Manning, Annye Rusk MS, [place] 06/17/2017, 10:55 AM  [facility] [address] [place], [place], [address] Phone: [phone]   Fax:  [place]   Name: Stephanie Lloyd MRN: [id number] Date of Birth: Jan 18, 1971

## 2017-06-18 ENCOUNTER — Ambulatory Visit: Payer: Medicaid Other

## 2017-06-18 NOTE — Therapy (Signed)
[facility] [address] [place], [place], [address] Phone: [phone]   Fax:  [phone]  Physical Therapy Treatment  Patient Details  Name: Stephanie Lloyd MRN: [id number] Date of Birth: January 05, 1971 Referring Provider: Rosalin Hawking (neurologist)   Encounter Date: 06/17/2017  PT End of Session - 06/17/17 1523    Visit Number  4    Number of Visits  10    Authorization Type  Medicaid    Authorization Time Period   06/03/17-06/23/17 for 3 visits    Authorization - Visit Number  3    Authorization - Number of Visits  3    PT Start Time  0930    PT Stop Time  1015    PT Time Calculation (min)  45 min    Equipment Utilized During Treatment  Gait belt    Activity Tolerance  Patient tolerated treatment well;No increased pain    Behavior During Therapy  [facility] for tasks assessed/performed;Flat affect       Past Medical History:  Diagnosis Date  . Diabetes ([place])   . Diabetes mellitus without complication ([place])   . Diastolic dysfunction   . Hypertension   . Immune deficiency disorder ([place])   . Menometrorrhagia 04/01/2016  . Stroke [facility])     Past Surgical History:  Procedure Laterality Date  . NO PAST SURGERIES      There were no vitals filed for this visit.  Subjective Assessment - 06/17/17 0929    Subjective  Husband reports one near fall but caught herself on bed. She has done the exercises a little but has been busy.     Pertinent History  hx of CVA/ICH 03/2017, 01/2016, 09/2015 & 05/2014, HTN, DM2, polyneuropathy,  facial droop, CKD st III,     Limitations  Lifting;Walking;House hold activities;Standing    Patient Stated Goals  Be able to walk better & faster, do more activities    Currently in Pain?  No/denies                       [facility] Adult PT Treatment/Exercise - 06/17/17 0930      Transfers   Transfers  Sit to Stand;Stand to Sit    Sit to Stand  5: Supervision;With upper extremity assist;With  armrests;From chair/3-in-1    Stand to Sit  5: Supervision;With upper extremity assist;With armrests;To chair/3-in-1      Ambulation/Gait   Ambulation/Gait  Yes    Ambulation/Gait Assistance  5: Supervision;4: Min guard    Ambulation/Gait Assistance Details  working on scanning during gait, then performing simple cognitive naming tasks. Intermittent Min guard for balance losses.     Ambulation Distance (Feet)  1000 Feet    Assistive device  None    Gait Pattern  Shuffle;Narrow base of support;Poor foot clearance - right;Trunk flexed;Lateral hip instability;Right flexed knee in stance;Left flexed knee in stance;Decreased arm swing - right;Decreased arm swing - left;Step-to pattern;Decreased stride length    Ambulation Surface  Indoor;Level;Outdoor;Paved    Ramp  4: Min assist Min Guard     Ramp Details (indicate cue type and reason)  verbal & tactile cues on upright posture, step length & wt shift over stance limb    Curb  4: Min assist Min guard    Curb Details (indicate cue type and reason)  verbal & tactile cues on upright posture, step length & wt shift over stance limb      High Level Balance   High  Level Balance Activities  Side stepping;Marching forwards;Other (comment) single leg stance with intermittent UE support    High Level Balance Comments  PT reviewed 3 balance exercises near counter for UE support with pt & husband.       Knee/Hip Exercises: Aerobic   Stepper  SciFit recumbent stepper level 2 with BUEs & BLEs ~40spm for 10 minutes with PT instructing pt & husband in proper use of machine with plans to use at [employer] as guest for [employer]               PT Short Term Goals - 06/17/17 1524      PT SHORT TERM GOAL #1   Title  Patient demonstrates understanding of initial HEP. (All STGs Target Date 06/20/2017)    Baseline  MET 06/17/2017 Patient was able to demonstrate understanding of intial HEP.    Time  3    Period  Weeks    Status  Achieved      PT  SHORT TERM GOAL #2   Title  Patient standing balance reaches 5" anteriorly & to floor without UE support with supervision.     Baseline  MET 06/17/2017  patient able to reach 5" anteriorly & to floor without UE support with supervision     Time  3    Period  Weeks    Status  Achieved      PT SHORT TERM GOAL #3   Title  Patient ambulates 200' without device with supervision.     Baseline  MET 06/17/2017     Time  3    Period  Weeks    Status  Achieved      PT SHORT TERM GOAL #4   Title  Patient ambulates 1000' without device outdoors including grass while performing simple naming cognitive tasks with supervision without balance loss.  (All NEW STGs Target Date 7th visit)     Baseline  06/17/2017 Patient ambulates 1000' without device on paved surfaces only while performing simple naming cognitive tasks with minA for balance loss.     Time  3    Period  Weeks    Status  New      PT SHORT TERM GOAL #5   Title  Timed Up & Go standard <13.5 sec    Baseline  TUG 18.02 sec    Time  3    Period  Weeks    Status  New      Additional Short Term Goals   Additional Short Term Goals  Yes      PT SHORT TERM GOAL #6   Title  Patient ambulates with head turns to scan environment without balance loss or change in pace which indicates fall risk. (All NEW STGs Target Date 7th visit)     Baseline  Patient has balance losses & significantly slows gait with head turns to scan environment.     Time  3    Period  Weeks    Status  New        PT Long Term Goals - 06/12/17 2154      PT LONG TERM GOAL #1   Title  Patient demonstrates & verbalizes understanding of ongoing HEP.    Baseline  Patient is dependent with appropriate exercises with medical conditions.     Time  3    Period  Months    Status  On-going      PT LONG TERM GOAL #2   Title  Berg Balance >  45/56 to indicate lower fall risk.     Baseline  Berg Balance 34/56    Time  3    Period  Months    Status  On-going      PT LONG TERM  GOAL #3   Title  Timed Up-Go standard <13.5seconds and cognitive TUG <17.5seconds to indicate lower fall risk.     Baseline  std TUG 18.02sec and cognitive TUG 21.17    Time  3    Period  Months    Status  On-going      PT LONG TERM GOAL #4   Title  Functional Gait Assessment >/= 15/30    Baseline  FGA 7/30    Time  3    Period  Months    Status  On-going      PT LONG TERM GOAL #5   Title  Gait Velocity >2.00 ft/sec to indicate improved functional mobility.    Baseline  Gait Velocity 1.53 ft/sec    Time  3    Period  Months    Status  On-going            Plan - 06/17/17 1644    Clinical Impression Statement  Patient met 3 of 3 STGs set for initial 3 visits. She has improved static standing balance but still has difficulty with dynamic balance especially in gait. She has cognitive deficits which slows ability to learn proper activities & exercises to perform outside of PT.     Rehab Potential  Good    Clinical Impairments Affecting Rehab Potential  lives with husband who has disability and father who has dementia,     PT Frequency  1x / week 9 visits over 3 months    PT Duration  Other (comment) 9 visits over 3 months    PT Treatment/Interventions  ADLs/Self Care Home Management;DME Instruction;Gait training;Stair training;Functional mobility training;Therapeutic activities;Therapeutic exercise;Balance training;Neuromuscular re-education;Patient/family education;Passive range of motion;Vestibular;Canalith Repostioning    PT Next Visit Plan  work towards updated STGs, Neuromuscular Re-education & gait training with dual tasks,    Consulted and Agree with Plan of Care  Patient       Patient will benefit from skilled therapeutic intervention in order to improve the following deficits and impairments:  Abnormal gait, Decreased activity tolerance, Decreased balance, Decreased coordination, Decreased endurance, Decreased mobility, Decreased range of motion, Decreased strength,  Impaired flexibility, Postural dysfunction  Visit Diagnosis: Muscle weakness (generalized)  Unsteadiness on feet  Abnormal posture  Other abnormalities of gait and mobility     Problem List Patient Active Problem List   Diagnosis Date Noted  . History of intracranial hemorrhage 05/12/2017  . Facial droop 04/18/2017  . CKD (chronic kidney disease), stage III ([place]) 04/18/2017  . Anxiety 01/16/2017  . Leg cramps 01/16/2017  . Hypoglycemia due to insulin 08/29/2016  . Diverticulosis 08/29/2016  . Diverticulitis 08/29/2016  . 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 OSA (obstructive sleep apnea) 06/10/2016  . Iron deficiency anemia due to chronic blood loss 04/01/2016  . Menometrorrhagia 04/01/2016  . Upper respiratory infection 03/25/2016  . History of stroke   . History of intracerebral hemorrhage without residual deficit   . Cerebrovascular accident (CVA) due to thrombosis of basilar artery ([place]) 02/28/2016  . Ischemic stroke ([place]) 02/28/2016  . Acute kidney injury ([place]) 10/28/2015  . Acute encephalopathy 10/28/2015  . Diastolic dysfunction   . Stroke (cerebrum) ([place])   . Focal infarction of brain ([place]) 10/08/2015  .  Syncope 08/19/2015  . Hypertensive emergency 07/29/2015  . Hypertensive urgency 07/28/2015  . Hypokalemia 07/28/2015  . Accelerated hypertension 09/21/2014  . Type 2 diabetes mellitus with other circulatory complications ([place]) 82/66/6648  . Hyperlipidemia 09/21/2014  . Thalamic hemorrhage with stroke ([place]) 07/29/2014  . Hyperlipidemia LDL goal <70 07/29/2014  . Hemorrhagic stroke ([place]) 06/22/2014  . ICH (intracerebral hemorrhage) ([place]) 06/22/2014  . Essential hypertension 05/13/2012    Jamey Reas PT, DPT 06/18/2017, 6:57 AM  [facility] [address] [place], [place], [address] Phone: [phone]   Fax:  [phone]  Name: ESMEE FALLAW MRN: [id number] Date of Birth: 06-01-1970

## 2017-06-20 ENCOUNTER — Encounter

## 2017-06-25 ENCOUNTER — Ambulatory Visit: Payer: Medicaid Other | Admitting: Gastroenterology

## 2017-06-25 ENCOUNTER — Encounter: Payer: Self-pay | Admitting: Gastroenterology

## 2017-06-25 ENCOUNTER — Ambulatory Visit: Payer: Medicaid Other | Admitting: Occupational Therapy

## 2017-06-25 ENCOUNTER — Other Ambulatory Visit (HOSPITAL_COMMUNITY): Payer: Self-pay | Admitting: Gastroenterology

## 2017-06-25 VITALS — BP 122/70 | HR 64 | Ht 70.0 in | Wt 201.0 lb

## 2017-06-25 DIAGNOSIS — K625 Hemorrhage of anus and rectum: Secondary | ICD-10-CM

## 2017-06-25 DIAGNOSIS — T17320A Food in larynx causing asphyxiation, initial encounter: Secondary | ICD-10-CM | POA: Diagnosis not present

## 2017-06-25 DIAGNOSIS — R1319 Other dysphagia: Secondary | ICD-10-CM

## 2017-06-25 NOTE — Progress Notes (Signed)
HPI: This is a very pleasant 47 year old woman who was referred to me by Carollee Herter, Alferd Apa, *  to evaluate rectal bleeding.    Chief complaint is minor rectal bleeding  When she was hospitalized for stroke in March, April of this year she had some minor rectal bleeding.  She had been constipated and took a lot of laxatives this led to significant stool output and some minor rectal bleeding.  Over the past several years she has had 5 strokes.  She sees neurology about this.  She is not a candidate for blood thinners due to previous intracranial hemorrhage. Troubled with fatigue, confusion, PT helps her with walking.  Has dysarthria and sees speech therapy for this.  Can choke with swallowing at times.  Seems to compensate and has been eating relatively full meals without weight loss.  Can have trouble with constipation, fecal impactions, will manually disimpact. Sensation of incomplete evacuation.  Takes miralax.periodically.     Old Data Reviewed:  Cbc 04/2017: Hb was normal.  Cut pasted neurology note from 04/2017: ------------------------------------------------------------------------------------------------------------------ She does have multiple risk factors (HTN, DM, HLD), as well as extensive hx of lacunar infarcts, right thalamic ICH, extensive microbleds, and significant white matter ischemic changes, small vessel disease is most likely diagnosis. However, due to extensive microbleds, lacunar infarcts, significant white matter ischemic changes at such a young age, single gene mutation associated vasculopathy such as COL4A1, COL4A2, CADASIL and CARASIL need to be considered. Genetic testing would be more informative. Genetic testing so far not approved by insurance (Medicaid).  04/17/17 admission to [facility] for acute left periventricular WM and subacute anterior right frontal punctate infarcts.  CTA head and neck mild cavernous right ICA narrowing.  EF 60-65%.  LDL 130 and A1c 7.0.   Stroke most likely small vessel disease givenhx of SVD,pontine infarcts&thalamic small ICHas well as severeCMBs.  Continue aspirin 325, do not recommend Plavix at that time due to innumerable microhemorrhages and a history of a right brain ICH. Pt still has s/s of OSA, will do sleep study. Continue PT and will order speech therapy. ---------------------------------------------------------------------------------------------------------------------------------------------  She saw speech therapy last week, they are working on her helping with her dysarthria.  Modified barium  swallow study February 2018 shows "mild oral pharyngeal dysphasia"  Review of systems: Pertinent positive and negative review of systems were noted in the above HPI section. All other review negative.   Past Medical History:  Diagnosis Date  . Diabetes ([place])   . Diabetes mellitus without complication ([place])   . Diastolic dysfunction   . Hypertension   . Immune deficiency disorder ([place])   . Menometrorrhagia 04/01/2016  . Stroke [facility])     Past Surgical History:  Procedure Laterality Date  . NO PAST SURGERIES      Current Outpatient Medications  Medication Sig Dispense Refill  . ACCU-CHEK SOFTCLIX LANCETS lancets Use as directed twice a day.  Dx Code: E11.9 100 each 1  . amLODipine (NORVASC) 10 MG tablet TAKE 1 TABLET BY MOUTH ONCE DAILY 30 tablet 11  . aspirin EC 325 MG EC tablet Take 1 tablet (325 mg total) by mouth daily. 30 tablet 11  . atorvastatin (LIPITOR) 80 MG tablet Take 1 tablet (80 mg total) by mouth daily at 6 PM. 90 tablet 0  . Blood Glucose Monitoring Suppl (ACCU-CHEK AVIVA PLUS) w/Device KIT Use as directed twice a day.  Dx Code: E11.9 1 kit 0  . fluticasone (FLONASE) 50 MCG/ACT nasal spray Place 2 sprays into both  nostrils daily. 16 g 1  . glucose blood (ACCU-CHEK AVIVA PLUS) test strip Use as directed twice a day.  Dx Code: E11.9 100 each 1  . linagliptin (TRADJENTA) 5 MG TABS tablet Take 1  tablet (5 mg total) by mouth daily. 30 tablet 11  . lisinopril (PRINIVIL,ZESTRIL) 40 MG tablet Take 1 tablet (40 mg total) by mouth daily. 90 tablet 1  . pioglitazone (ACTOS) 30 MG tablet Take 1 tablet (30 mg total) by mouth daily. 30 tablet 2  . prednisoLONE acetate (PRED FORTE) 1 % ophthalmic suspension Place 1 drop into both eyes 2 (two) times daily.     . solifenacin (VESICARE) 10 MG tablet TAKE 1 TABLET BY MOUTH ONCE DAILY 30 tablet 2   No current facility-administered medications for this visit.     Allergies as of 06/25/2017 - Review Complete 06/25/2017  Allergen Reaction Noted  . Metformin and related Diarrhea 06/30/2014    Family History  Problem Relation Age of Onset  . Diabetes Father   . Hypertension Father   . Stroke Father   . Diabetes Paternal Uncle   . Stroke Paternal Uncle   . Stroke Brother   . Cancer Neg Hx   . Breast cancer Neg Hx   . Ovarian cancer Neg Hx   . Colon cancer Neg Hx   . Esophageal cancer Neg Hx     Social History   Socioeconomic History  . Marital status: Married    Spouse name: Not on file  . Number of children: Not on file  . Years of education: Not on file  . Highest education level: Not on file  Occupational History  . Not on file  Social Needs  . Financial resource strain: Not on file  . Food insecurity:    Worry: Not on file    Inability: Not on file  . Transportation needs:    Medical: Not on file    Non-medical: Not on file  Tobacco Use  . Smoking status: Never Smoker  . Smokeless tobacco: Never Used  Substance and Sexual Activity  . Alcohol use: No    Alcohol/week: 0.0 oz    Comment: occ  . Drug use: No  . Sexual activity: Yes    Birth control/protection: None  Lifestyle  . Physical activity:    Days per week: Not on file    Minutes per session: Not on file  . Stress: Not on file  Relationships  . Social connections:    Talks on phone: Not on file    Gets together: Not on file    Attends religious service:  Not on file    Active member of club or organization: Not on file    Attends meetings of clubs or organizations: Not on file    Relationship status: Not on file  . Intimate partner violence:    Fear of current or ex partner: Not on file    Emotionally abused: Not on file    Physically abused: Not on file    Forced sexual activity: Not on file  Other Topics Concern  . Not on file  Social History Narrative   ** Merged History Encounter **         Physical Exam: BP 122/70   Pulse 64   Ht '5\' 10"'  (1.778 m)   Wt 201 lb (91.2 kg)   BMI 28.84 kg/m  Constitutional: generally well-appearing Psychiatric: alert and oriented x3 Eyes: extraocular movements intact Mouth: oral pharynx moist, no lesions Neck:  supple no lymphadenopathy Cardiovascular: heart regular rate and rhythm Lungs: clear to auscultation bilaterally Abdomen: soft, nontender, nondistended, no obvious ascites, no peritoneal signs, normal bowel sounds Extremities: no lower extremity edema bilaterally Skin: no lesions on visible extremities   Assessment and plan: 47 y.o. female with significant neurologic disease, several strokes over the past many years, dysarthria, choking with eating, minor rectal bleeding  First she has had several strokes over the past many years.  She sees neurology.  She is not a candidate for blood thinners due to previous intracranial hemorrhage.  Her most recent stroke was about 2 months ago and I think I would like to avoid any invasive testing, sedation for at least another 2 or 3 months if possible.  Her rectal bleeding was quite minor, it occurred around the time of some constipation and then laxative use.  Her hemoglobin last month was normal.  She is bothered by periodic constipation and the feeling of incomplete evacuation so I recommend she start taking Citrucel rather than MiraLAX on a daily basis.  She will return to the office to discuss her bowels, see if any more bleeding is happened in  about 2 or 3 months.  If she has suffered no further neurologic issues then I think it would be safer to proceed with colonoscopy at that point.  She has clear dysarthria from her previous strokes and she also admits to some choking when swallowing.  I think she is probably at risk for aspiration.  I want to better understand that with a repeat modified barium swallow study by speech therapy and I will order that.  Please see the "Patient Instructions" section for addition details about the plan.   Owens Loffler, MD [facility] 06/25/2017, 10:36 AM  Cc: Carollee Herter, Alferd Apa, *

## 2017-06-25 NOTE — Patient Instructions (Addendum)
MBSS with speech therapy for choking during eating (several strokes in the past). Please start taking citrucel (orange flavored) powder fiber supplement.  This may cause some bloating at first but that usually goes away. Begin with a small spoonful and work your way up to a large, heaping spoonful daily over a week. Please return to see Dr. Christella Hartigan in 3 months (will consider colonoscopy at that time pending further stroke recovery).  Normal BMI (Body Mass Index- based on height and weight) is between 19 and 25. Your BMI today is Body mass index is 28.84 kg/m. Marland Kitchen Please consider follow up  regarding your BMI with your Primary Care Provider.  You have been scheduled for a modified barium swallow on  at . Please arrive 15 minutes prior to your test for registration. You will go to  Radiology (1st Floor) for your appointment. Please refrain from eating or drinking anything 4 hours prior to your test. Should you need to cancel or reschedule your appointment, please contact [phone] [facility]) or [phone] [facility]). _____________________________________________________________________ A Modified Barium Swallow Study, or MBS, is a special x-ray that is taken to check swallowing skills. It is carried out by a Marine scientist and a Warehouse manager (SLP). During this test, yourmouth, throat, and esophagus, a muscular tube which connects your mouth to your stomach, is checked. The test will help you, your doctor, and the SLP plan what types of foods and liquids are easier for you to swallow. The SLP will also identify positions and ways to help you swallow more easily and safely. What will happen during an MBS? You will be taken to an x-ray room and seated comfortably. You will be asked to swallow small amounts of food and liquid mixed with barium. Barium is a liquid or paste that allows images of your mouth, throat and esophagus to be seen on x-ray. The x-ray captures moving images of the food  you are swallowing as it travels from your mouth through your throat and into your esophagus. This test helps identify whether food or liquid is entering your lungs (aspiration). The test also shows which part of your mouth or throat lacks strength or coordination to move the food or liquid in the right direction. This test typically takes 30 minutes to 1 hour to complete. _______________________________________________________________________

## 2017-07-01 ENCOUNTER — Ambulatory Visit: Payer: Medicaid Other | Admitting: Physical Therapy

## 2017-07-01 ENCOUNTER — Ambulatory Visit: Payer: Medicaid Other

## 2017-07-02 ENCOUNTER — Ambulatory Visit: Payer: Medicaid Other | Attending: Family Medicine | Admitting: Occupational Therapy

## 2017-07-02 ENCOUNTER — Ambulatory Visit: Payer: Medicaid Other | Attending: Neurology

## 2017-07-02 DIAGNOSIS — R29898 Other symptoms and signs involving the musculoskeletal system: Secondary | ICD-10-CM | POA: Insufficient documentation

## 2017-07-02 DIAGNOSIS — R471 Dysarthria and anarthria: Secondary | ICD-10-CM

## 2017-07-02 DIAGNOSIS — R278 Other lack of coordination: Secondary | ICD-10-CM | POA: Insufficient documentation

## 2017-07-02 DIAGNOSIS — R2681 Unsteadiness on feet: Secondary | ICD-10-CM

## 2017-07-02 DIAGNOSIS — I69318 Other symptoms and signs involving cognitive functions following cerebral infarction: Secondary | ICD-10-CM | POA: Insufficient documentation

## 2017-07-02 DIAGNOSIS — M6281 Muscle weakness (generalized): Secondary | ICD-10-CM | POA: Insufficient documentation

## 2017-07-02 DIAGNOSIS — R293 Abnormal posture: Secondary | ICD-10-CM | POA: Diagnosis present

## 2017-07-02 DIAGNOSIS — R2689 Other abnormalities of gait and mobility: Secondary | ICD-10-CM | POA: Diagnosis present

## 2017-07-02 DIAGNOSIS — R41841 Cognitive communication deficit: Secondary | ICD-10-CM | POA: Diagnosis present

## 2017-07-02 DIAGNOSIS — M25611 Stiffness of right shoulder, not elsewhere classified: Secondary | ICD-10-CM | POA: Insufficient documentation

## 2017-07-02 DIAGNOSIS — M25511 Pain in right shoulder: Secondary | ICD-10-CM | POA: Diagnosis present

## 2017-07-02 DIAGNOSIS — R1312 Dysphagia, oropharyngeal phase: Secondary | ICD-10-CM | POA: Diagnosis present

## 2017-07-02 DIAGNOSIS — I69351 Hemiplegia and hemiparesis following cerebral infarction affecting right dominant side: Secondary | ICD-10-CM | POA: Insufficient documentation

## 2017-07-02 NOTE — Therapy (Signed)
[facility] [address] [place], [place], [address] Phone: [phone]   Fax:  [phone]  Occupational Therapy Treatment  Patient Details  Name: Stephanie Lloyd MRN: [id number] Date of Birth: August 03, 1970 Referring Provider: Marvel Plan (neurologist)   Encounter Date: 07/02/2017  OT End of Session - 07/02/17 1420    Visit Number  2    Number of Visits  9    Date for OT Re-Evaluation  08/19/17    Authorization Type  MCD     Authorization Time Period  06/25/17 - 08/19/17    Authorization - Visit Number  1    Authorization - Number of Visits  8    OT Start Time  1318    OT Stop Time  1400    OT Time Calculation (min)  42 min    Activity Tolerance  Patient tolerated treatment well       Past Medical History:  Diagnosis Date  . Diabetes (HCC)   . Diabetes mellitus without complication (HCC)   . Diastolic dysfunction   . Hypertension   . Immune deficiency disorder (HCC)   . Menometrorrhagia 04/01/2016  . Stroke [facility])     Past Surgical History:  Procedure Laterality Date  . NO PAST SURGERIES      There were no vitals filed for this visit.  Subjective Assessment - 07/02/17 1323    Subjective   I'm doing good    Pertinent History  Recent CVA 04/17/17. PMH: 05/2014 (affecting Lt side), 09/2015 (affecting Rt side), 01/2016 (both sides, Rt worse), HTN, DM2, Polyneuropathy, CKD stage III    Limitations  fall risk, no driving    Patient Stated Goals  folding laundry, writing     Currently in Pain?  No/denies                   OT Treatments/Exercises (OP) - 07/02/17 0001      ADLs   Functional Mobility  Discussed installing grab bars for safety with tub/shower transfers and where to have them placed in shower. Pt reports her bench does not fit in her tub/shower b/c its too small    ADL Comments  Pt shown different options for tying shoes and provided handout and told how/where to purchase. Discussed  pros/cons of each A/E. Also reviewed current goals and POC     Exercises   Exercises  Hand      Fine Motor Coordination (Hand/Wrist)   Fine Motor Coordination  -- Re-issued coordination and putty HEP from 04/08/2016 & reviewed. Updated to include 1 ex with cards for thumb isolation. Pt demo each ex from coordination ex's             OT Education - 07/02/17 1420    Education Details  A/E recommendations, safety with tub/shower transfers, review of previously issued putty and coordination HEP    Person(s) Educated  Patient    Methods  Explanation;Demonstration;Handout    Comprehension  Verbalized understanding;Returned demonstration          OT Long Term Goals - 07/02/17 1422      OT LONG TERM GOAL #1   Title  Independent with coordination HEP and putty HEP for bilateral hands - 08/19/17    Baseline  Issued on previous admission but has not been doing since last year, will need review/updates    Time  8    Period  Weeks    Status  Achieved      OT LONG  TERM GOAL #2   Title  Improve coordination bilaterally as evidenced by reducing speed on 9 hole peg test by 5 sec. or more    Baseline  eval: Rt = 35.22 sec. sec, Lt = 41.53 sec    Time  8    Period  Weeks    Status  On-going      OT LONG TERM GOAL #3   Title  Grip strength to improve bilaterally by 5 lbs or greater to open jars/containers    Baseline  eval: RT = 46 lbs, Lt = 51 lbs    Time  8    Period  Weeks    Status  On-going      OT LONG TERM GOAL #4   Title  Pt to return to folding laundry with BUE's    Baseline  dependent    Time  8    Period  Weeks    Status  New      OT LONG TERM GOAL #5   Title  Pt to demo increased Rt shoulder flexion to 115* or greater for overhead reaching     Baseline  100*    Time  8    Period  Weeks    Status  New      OT LONG TERM GOAL #6   Title  Pt to verbalize understanding with A/E options for tying shoes, writing, and cooking to increase ease, independence, and safety  with these tasks    Baseline  dependent     Time  8    Period  Weeks    Status  New            Plan - 07/02/17 1423    Clinical Impression Statement  Pt agrees to POC and return demo of previously issued HEP    Occupational Profile and client history currently impacting functional performance  PMH: h/o multiple CVA'S (05/2014, 09/2015, 01/2016), HTN, DM2, CKD stage III    Occupational performance deficits (Please refer to evaluation for details):  ADL's;IADL's;Leisure    Rehab Potential  Good    OT Frequency  1x / week    OT Duration  8 weeks    OT Treatment/Interventions  Self-care/ADL training;Moist Heat;DME and/or AE instruction;Aquatic Therapy;Therapeutic activities;Therapeutic exercise;Cognitive remediation/compensation;Coping strategies training;Neuromuscular education;Functional Mobility Training;Passive range of motion;Manual Therapy;Patient/family education;Visual/perceptual remediation/compensation    Plan  HEP for Rt shoulder (pt developed frozen shoulder from stroke in Jan 2018), pt also to bring in tennis shoes to practice use of shoe buttons (pt may be able to tie shoes w/o AE)    Consulted and Agree with Plan of Care  Patient       Patient will benefit from skilled therapeutic intervention in order to improve the following deficits and impairments:  Decreased coordination, Decreased range of motion, Decreased coping skills, Decreased endurance, Decreased safety awareness, Decreased knowledge of precautions, Decreased activity tolerance, Impaired UE functional use, Decreased knowledge of use of DME, Decreased balance, Decreased cognition, Decreased mobility, Decreased strength, Impaired perceived functional ability, Impaired vision/preception  Visit Diagnosis: Other lack of coordination  Unsteadiness on feet  Muscle weakness (generalized)    Problem List Patient Active Problem List   Diagnosis Date Noted  . History of intracranial hemorrhage 05/12/2017  . 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 CKD (chronic kidney disease), stage III (HCC) 04/18/2017  . Anxiety 01/16/2017  . Leg cramps 01/16/2017  . Hypoglycemia due to insulin 08/29/2016  . Diverticulosis 08/29/2016  . Diverticulitis 08/29/2016  . 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 OSA (obstructive sleep apnea) 06/10/2016  . Iron deficiency anemia due to chronic blood loss 04/01/2016  . Menometrorrhagia 04/01/2016  . Upper respiratory infection 03/25/2016  . History of stroke   . History of intracerebral hemorrhage without residual deficit   . Cerebrovascular accident (CVA) due to thrombosis of basilar artery (HCC) 02/28/2016  . Ischemic stroke (HCC) 02/28/2016  . Acute kidney injury (HCC) 10/28/2015  . Acute encephalopathy 10/28/2015  . Diastolic dysfunction   . Stroke (cerebrum) (HCC)   . Focal infarction of brain (HCC) 10/08/2015  . Syncope 08/19/2015  . Hypertensive emergency 07/29/2015  . Hypertensive urgency 07/28/2015  . Hypokalemia 07/28/2015  . Accelerated hypertension 09/21/2014  . Type 2 diabetes mellitus with other circulatory complications (HCC) 09/21/2014  . Hyperlipidemia 09/21/2014  . 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 Hyperlipidemia LDL goal <70 07/29/2014  . Hemorrhagic stroke (HCC) 06/22/2014  . ICH (intracerebral hemorrhage) (HCC) 06/22/2014  . Essential hypertension 05/13/2012    Kelli ChurnBallie, Orean Giarratano Johnson, OTR/L 07/02/2017, 2:27 PM  [facility] [address] [place], [place], [address] Phone: [phone]   Fax:  [phone]  Name: Manfred ShirtsMarva N Lloyd MRN: [id number] Date of Birth: 01/17/1971

## 2017-07-02 NOTE — Therapy (Signed)
[facility] [address] [place], [place], [address] Phone: [phone]   Fax:  [phone]  Speech Language Pathology Treatment  Patient Details  Name: Stephanie ShirtsMarva N Lloyd MRN: [id number] Date of Birth: 03/20/1970 Referring Provider: Marvel PlanXu, Jindong, MD   Encounter Date: 07/02/2017  End of Session - 07/02/17 1505    Visit Number  5    Number of Visits  18    Date for SLP Re-Evaluation  08/15/17    Authorization Type  medicaid    Authorization Time Period  06-02-17 to July     Authorization - Visit Number  1    Authorization - Number of Visits  6    SLP Start Time  1405    SLP Stop Time   1445    SLP Time Calculation (min)  40 min       Past Medical History:  Diagnosis Date  . Diabetes (HCC)   . Diabetes mellitus without complication (HCC)   . Diastolic dysfunction   . Hypertension   . Immune deficiency disorder (HCC)   . Menometrorrhagia 04/01/2016  . Stroke [facility](HCC)     Past Surgical History:  Procedure Laterality Date  . NO PAST SURGERIES      There were no vitals filed for this visit.  Subjective Assessment - 07/02/17 1417    Subjective  Pt did not get a full practice schedule last week due to husband in teh hospital with sickle cell flareup.    Currently in Pain?  No/denies            ADULT SLP TREATMENT - 07/02/17 1426      General Information   Behavior/Cognition  Alert;Cooperative;Pleasant mood      Treatment Provided   Treatment provided  Cognitive-Linquistic      Cognitive-Linquistic Treatment   Treatment focused on  Dysarthria    Skilled Treatment  Pt and SLP spoke of pt's inability to perform HEP since last visit due to husband in hospital- pt unable to return home to get exercises to practice them. SLP reassured pt that as long as she returns to completing soon she will not have lost so much ground. Pt spoke with SLP in 25 minutes min-mod copmlex conversation using compensatory strategies and  one instance for repetition necessary.       Assessment / Recommendations / Plan   Plan  Continue with current plan of care;Goals updated      Progression Toward Goals   Progression toward goals  Progressing toward goals d/c likely next session       SLP Education - 07/02/17 1504    Education provided  Yes    Education Details  get back to doing HEP    Person(s) Educated  Patient    Methods  Explanation    Comprehension  Verbalized understanding       SLP Short Term Goals - 07/02/17 1427      SLP SHORT TERM GOAL #1   Title  pt will complete dysarthria (intelligibility) HEP with occasional min A    Baseline  mod A occasionally    Status  Achieved      SLP SHORT TERM GOAL #2   Title  pt will complete oral motor HEP with occasional min A    Baseline  occasional mod A    Status  Achieved      SLP SHORT TERM GOAL #3   Title  pt will demo dysarthria compensations in 10/20 sentences  Baseline  not attempted yet    Period  Weeks    Status  Achieved      SLP SHORT TERM GOAL #4   Title  Pt will use dysarthria compensations at least 75% in a simple 5 minute conversation    Baseline  50% of the time in 5 minutes inside ST room    Status  Achieved       SLP Long Term Goals - 07/02/17 1428      SLP LONG TERM GOAL #1   Title  pt will complete HEP for dysarthria (intelligibility) with rare min A over 3 sessions    Baseline  occasional mod A;   06-12-17    Time  5    Period  Weeks or 17 total visits, for all LTGs    Status  Revised      SLP LONG TERM GOAL #2   Title  Pt will utilize compensations for dysarthria in simple-mod complex conversation over 5 minutes with rare min A x 3 visits    Baseline  pt not utilizing compensations for dysarthria; 07-02-17    Time  5    Period  Weeks    Status  On-going       Plan - 07/02/17 1455    Clinical Impression Statement  Pt conitnues to demonstrate improvement in carryover of compensations for dysarthria to improve intellgilbility  - today pt used compensations with 25 minutes min-mod complex conversation. Pt will benefit from continuing skilled ST, including oral motor exercises and compensations for dysarthria to maximize intellgilbity in a variety of environments. SLP and pt agree that pt is functional/normal and it may be best to conserve visits at this time. She will come once more and then discharge is likely. Modified (MBSS) is scheduled 07-08-17.    Speech Therapy Frequency  1x /week    Treatment/Interventions  Oral motor exercises;Pharyngeal strengthening exercises;Diet toleration management by SLP;Trials of upgraded texture/liquids;Internal/external aids;Multimodal communcation approach;Compensatory strategies;Patient/family education;Functional tasks;SLP instruction and feedback    Potential to Achieve Goals  Fair    Potential Considerations  Previous level of function;Severity of impairments    Consulted and Agree with Plan of Care  Patient       Patient will benefit from skilled therapeutic intervention in order to improve the following deficits and impairments:   Dysarthria and anarthria  Dysphagia, oropharyngeal phase  Cognitive communication deficit    Problem List Patient Active Problem List   Diagnosis Date Noted  . History of intracranial hemorrhage 05/12/2017  . Facial droop 04/18/2017  . CKD (chronic kidney disease), stage III (HCC) 04/18/2017  . Anxiety 01/16/2017  . Leg cramps 01/16/2017  . Hypoglycemia due to insulin 08/29/2016  . Diverticulosis 08/29/2016  . Diverticulitis 08/29/2016  . 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 OSA (obstructive sleep apnea) 06/10/2016  . Iron deficiency anemia due to chronic blood loss 04/01/2016  . Menometrorrhagia 04/01/2016  . Upper respiratory infection 03/25/2016  . History of stroke   . History of intracerebral hemorrhage without residual deficit   . Cerebrovascular accident (CVA) due to thrombosis of  basilar artery (HCC) 02/28/2016  . Ischemic stroke (HCC) 02/28/2016  . Acute kidney injury (HCC) 10/28/2015  . Acute encephalopathy 10/28/2015  . Diastolic dysfunction   . Stroke (cerebrum) (HCC)   . Focal infarction of brain (HCC) 10/08/2015  . Syncope 08/19/2015  . Hypertensive emergency 07/29/2015  . Hypertensive urgency 07/28/2015  . Hypokalemia 07/28/2015  . Accelerated hypertension  09/21/2014  . Type 2 diabetes mellitus with other circulatory complications (HCC) 09/21/2014  . Hyperlipidemia 09/21/2014  . 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 Hyperlipidemia LDL goal <70 07/29/2014  . Hemorrhagic stroke (HCC) 06/22/2014  . ICH (intracerebral hemorrhage) (HCC) 06/22/2014  . Essential hypertension 05/13/2012    [facility] ,MS, CCC-SLP  07/02/2017, 3:06 PM  [facility] [address] [place], [place], [address] Phone: [phone]   Fax:  [phone]   Name: Stephanie Lloyd MRN: [id number] Date of Birth: 1970-08-17

## 2017-07-04 ENCOUNTER — Ambulatory Visit (HOSPITAL_COMMUNITY)
Admission: RE | Admit: 2017-07-04 | Discharge: 2017-07-04 | Disposition: A | Discharge status: Home / Self Care | Payer: Medicaid Other | Source: Ambulatory Visit | Attending: Gastroenterology | Admitting: Gastroenterology

## 2017-07-04 ENCOUNTER — Ambulatory Visit: Payer: Medicaid Other | Admitting: Physical Therapy

## 2017-07-04 ENCOUNTER — Encounter: Payer: Self-pay | Admitting: Physical Therapy

## 2017-07-04 DIAGNOSIS — R2681 Unsteadiness on feet: Secondary | ICD-10-CM

## 2017-07-04 DIAGNOSIS — T17320A Food in larynx causing asphyxiation, initial encounter: Secondary | ICD-10-CM | POA: Diagnosis not present

## 2017-07-04 DIAGNOSIS — R1319 Other dysphagia: Secondary | ICD-10-CM

## 2017-07-04 DIAGNOSIS — X58XXXA Exposure to other specified factors, initial encounter: Secondary | ICD-10-CM | POA: Insufficient documentation

## 2017-07-04 DIAGNOSIS — R2689 Other abnormalities of gait and mobility: Secondary | ICD-10-CM

## 2017-07-04 DIAGNOSIS — R278 Other lack of coordination: Secondary | ICD-10-CM | POA: Diagnosis not present

## 2017-07-04 DIAGNOSIS — R293 Abnormal posture: Secondary | ICD-10-CM

## 2017-07-04 DIAGNOSIS — M6281 Muscle weakness (generalized): Secondary | ICD-10-CM

## 2017-07-04 NOTE — Therapy (Signed)
[facility] [address] [place], [place], [address] Phone: [phone]   Fax:  [phone]  Physical Therapy Treatment  Patient Details  Name: Stephanie Lloyd MRN: [id number] Date of Birth: 1970-06-24 Referring Provider: Rosalin Hawking (neurologist)   Encounter Date: 07/04/2017  PT End of Session - 07/04/17 1455    Visit Number  5    Number of Visits  10    Authorization Type  Medicaid    Authorization Time Period   06/03/17-06/23/17 for 3 visits; 07/01/17-08/11/17 for 6 visits    Authorization - Visit Number  4    Authorization - Number of Visits  9    PT Start Time  2111    PT Stop Time  1530    PT Time Calculation (min)  42 min    Equipment Utilized During Treatment  Gait belt    Activity Tolerance  Patient tolerated treatment well;No increased pain    Behavior During Therapy  [facility] for tasks assessed/performed;Flat affect       Past Medical History:  Diagnosis Date  . Diabetes ([place])   . Diabetes mellitus without complication ([place])   . Diastolic dysfunction   . Hypertension   . Immune deficiency disorder ([place])   . Menometrorrhagia 04/01/2016  . Stroke [facility])     Past Surgical History:  Procedure Laterality Date  . NO PAST SURGERIES      There were no vitals filed for this visit.  Subjective Assessment - 07/04/17 1454    Subjective  One more episode of tripping with pt self catching and correcting her balance. No falls or pain today. Limited performance of HEP due to spouse was in hospital last week due to sickle cell crisis. Has done it a few times at home, "I need more practice".     Pertinent History  hx of CVA/ICH 03/2017, 01/2016, 09/2015 & 05/2014, HTN, DM2, polyneuropathy,  facial droop, CKD st III,     Limitations  Lifting;Walking;House hold activities;Standing    Patient Stated Goals  Be able to walk better & faster, do more activities    Currently in Pain?  No/denies             [facility] Adult PT  Treatment/Exercise - 07/04/17 1748      Neuro Re-ed    Neuro Re-ed Details   for strengthening/balance reactions: had pt perform all of her current HEP exercises for 3-6 reps each with min guard assist, cues on form and technique provided. current program appears to still be appropriate.           Balance Exercises - 07/04/17 1511      Balance Exercises: Standing   Rockerboard  Anterior/posterior;Lateral;Head turns;EO;EC;30 seconds;10 reps    Balance Beam  standing across black beam: alternating fwd stepping to floor/back onto beam x 10 reps each leg with up to mod assist for balance. cues on posture, step length, step height and weight shifting.       Balance Exercises: Standing   Rebounder Limitations  balance board in corner with a chair in front for safety:  pt performed the following both ways on the balance board- rocking the board with emphasis on tall posture with EO, progressing to EC; then holding the board steady for EC head movements left<>right, up<>down and diagonals both ways. min to mod assist needed for balance with cues on posture and anterior weight shifting as pt tended to loose balance in posterior direction.  PT Short Term Goals - 06/17/17 1524      PT SHORT TERM GOAL #1   Title  Patient demonstrates understanding of initial HEP. (All STGs Target Date 06/20/2017)    Baseline  MET 06/17/2017 Patient was able to demonstrate understanding of intial HEP.    Time  3    Period  Weeks    Status  Achieved      PT SHORT TERM GOAL #2   Title  Patient standing balance reaches 5" anteriorly & to floor without UE support with supervision.     Baseline  MET 06/17/2017  patient able to reach 5" anteriorly & to floor without UE support with supervision     Time  3    Period  Weeks    Status  Achieved      PT SHORT TERM GOAL #3   Title  Patient ambulates 200' without device with supervision.     Baseline  MET 06/17/2017     Time  3     Period  Weeks    Status  Achieved      PT SHORT TERM GOAL #4   Title  Patient ambulates 1000' without device outdoors including grass while performing simple naming cognitive tasks with supervision without balance loss.  (All NEW STGs Target Date 7th visit)     Baseline  06/17/2017 Patient ambulates 1000' without device on paved surfaces only while performing simple naming cognitive tasks with minA for balance loss.     Time  3    Period  Weeks    Status  New      PT SHORT TERM GOAL #5   Title  Timed Up & Go standard <13.5 sec    Baseline  TUG 18.02 sec    Time  3    Period  Weeks    Status  New      Additional Short Term Goals   Additional Short Term Goals  Yes      PT SHORT TERM GOAL #6   Title  Patient ambulates with head turns to scan environment without balance loss or change in pace which indicates fall risk. (All NEW STGs Target Date 7th visit)     Baseline  Patient has balance losses & significantly slows gait with head turns to scan environment.     Time  3    Period  Weeks    Status  New        PT Long Term Goals - 06/12/17 2154      PT LONG TERM GOAL #1   Title  Patient demonstrates & verbalizes understanding of ongoing HEP.    Baseline  Patient is dependent with appropriate exercises with medical conditions.     Time  3    Period  Months    Status  On-going      PT LONG TERM GOAL #2   Title  Berg Balance >45/56 to indicate lower fall risk.     Baseline  Berg Balance 34/56    Time  3    Period  Months    Status  On-going      PT LONG TERM GOAL #3   Title  Timed Up-Go standard <13.5seconds and cognitive TUG <17.5seconds to indicate lower fall risk.     Baseline  std TUG 18.02sec and cognitive TUG 21.17    Time  3    Period  Months    Status  On-going      PT LONG TERM GOAL #  4   Title  Functional Gait Assessment >/= 15/30    Baseline  FGA 7/30    Time  3    Period  Months    Status  On-going      PT LONG TERM GOAL #5   Title  Gait Velocity >2.00  ft/sec to indicate improved functional mobility.    Baseline  Gait Velocity 1.53 ft/sec    Time  3    Period  Months    Status  On-going            Plan - 07/04/17 1456    Clinical Impression Statement  Today's skilled session initially reviewed current HEP as pt reports not being able to perform at home due to spouse in hospital Program appears to still be appropriate. Remainder of session continued to focus on balance reactions with emphasis on complaint surfaces with vision removed. Pt continues to be challenged here needing assistance for balance. Pt is progressing toward goals and should benefit from continued PT to progress toward unmet goals.     Rehab Potential  Good    Clinical Impairments Affecting Rehab Potential  lives with husband who has disability and father who has dementia,     PT Frequency  1x / week 9 visits over 3 months    PT Duration  Other (comment) 9 visits over 3 months    PT Treatment/Interventions  ADLs/Self Care Home Management;DME Instruction;Gait training;Stair training;Functional mobility training;Therapeutic activities;Therapeutic exercise;Balance training;Neuromuscular re-education;Patient/family education;Passive range of motion;Vestibular;Canalith Repostioning    PT Next Visit Plan  Neuromuscular Re-education & gait training with dual tasks; advance HEP as indicated    Consulted and Agree with Plan of Care  Patient       Patient will benefit from skilled therapeutic intervention in order to improve the following deficits and impairments:  Abnormal gait, Decreased activity tolerance, Decreased balance, Decreased coordination, Decreased endurance, Decreased mobility, Decreased range of motion, Decreased strength, Impaired flexibility, Postural dysfunction  Visit Diagnosis: Unsteadiness on feet  Muscle weakness (generalized)  Other abnormalities of gait and mobility  Abnormal posture     Problem List Patient Active Problem List   Diagnosis Date  Noted  . History of intracranial hemorrhage 05/12/2017  . Facial droop 04/18/2017  . CKD (chronic kidney disease), stage III ([place]) 04/18/2017  . Anxiety 01/16/2017  . Leg cramps 01/16/2017  . Hypoglycemia due to insulin 08/29/2016  . Diverticulosis 08/29/2016  . Diverticulitis 08/29/2016  . 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 OSA (obstructive sleep apnea) 06/10/2016  . Iron deficiency anemia due to chronic blood loss 04/01/2016  . Menometrorrhagia 04/01/2016  . Upper respiratory infection 03/25/2016  . History of stroke   . History of intracerebral hemorrhage without residual deficit   . 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 Ischemic stroke ([place]) 02/28/2016  . Acute kidney injury ([place]) 10/28/2015  . Acute encephalopathy 10/28/2015  . Diastolic dysfunction   . Stroke (cerebrum) ([place])   . Focal infarction of brain ([place]) 10/08/2015  . Syncope 08/19/2015  . Hypertensive emergency 07/29/2015  . Hypertensive urgency 07/28/2015  . Hypokalemia 07/28/2015  . Accelerated hypertension 09/21/2014  . Type 2 diabetes mellitus with other circulatory complications ([place]) 73/41/9379  . Hyperlipidemia 09/21/2014  . Thalamic hemorrhage with stroke ([place]) 07/29/2014  . Hyperlipidemia LDL goal <70 07/29/2014  . Hemorrhagic stroke ([place]) 06/22/2014  . ICH (intracerebral hemorrhage) ([place]) 06/22/2014  . Essential hypertension 05/13/2012  Willow Ora, PTA, [place] [address] [place], [place] [address] [phone] 07/04/17, 5:52 PM   Name: Stephanie Lloyd MRN: [id number] Date of Birth: December 19, 1970

## 2017-07-04 NOTE — Progress Notes (Signed)
Modified Barium Swallow Progress Note  Patient Details  Name: Stephanie Lloyd MRN: [id number] Date of Birth: 07/07/1970  Today's Date: 07/04/2017  Modified Barium Swallow completed.  Full report located under Chart Review in the Imaging Section.  Brief recommendations include the following:  Clinical Impression  Pt demonstrates normal swallow function. Challenged pt to consecutive sips, conversation while eating and drinking, mixed consistencies. No delay or weakness observed throughout. Reiterated basic aspriation precautions. No f/u needed to address swallowing.    Swallow Evaluation Recommendations       SLP Diet Recommendations: Regular solids;Thin liquid                                Rudean Icenhour, Riley NearingBonnie [name] 07/04/2017,1:52 PM

## 2017-07-08 ENCOUNTER — Ambulatory Visit: Payer: Medicaid Other

## 2017-07-08 ENCOUNTER — Ambulatory Visit (INDEPENDENT_AMBULATORY_CARE_PROVIDER_SITE_OTHER): Payer: Medicaid Other | Admitting: Neurology

## 2017-07-08 ENCOUNTER — Encounter: Payer: Self-pay | Admitting: Physical Therapy

## 2017-07-08 ENCOUNTER — Encounter: Payer: Self-pay | Admitting: Occupational Therapy

## 2017-07-08 ENCOUNTER — Ambulatory Visit: Payer: Medicaid Other | Admitting: Physical Therapy

## 2017-07-08 ENCOUNTER — Ambulatory Visit: Payer: Medicaid Other | Admitting: Occupational Therapy

## 2017-07-08 ENCOUNTER — Encounter: Payer: Self-pay | Admitting: Neurology

## 2017-07-08 VITALS — BP 116/77 | HR 76 | Ht 70.0 in | Wt 204.5 lb

## 2017-07-08 DIAGNOSIS — R0683 Snoring: Secondary | ICD-10-CM | POA: Diagnosis not present

## 2017-07-08 DIAGNOSIS — R278 Other lack of coordination: Secondary | ICD-10-CM | POA: Diagnosis not present

## 2017-07-08 DIAGNOSIS — R29898 Other symptoms and signs involving the musculoskeletal system: Secondary | ICD-10-CM

## 2017-07-08 DIAGNOSIS — M25511 Pain in right shoulder: Secondary | ICD-10-CM

## 2017-07-08 DIAGNOSIS — M6281 Muscle weakness (generalized): Secondary | ICD-10-CM

## 2017-07-08 DIAGNOSIS — R4189 Other symptoms and signs involving cognitive functions and awareness: Secondary | ICD-10-CM | POA: Diagnosis not present

## 2017-07-08 DIAGNOSIS — F0151 Vascular dementia with behavioral disturbance: Secondary | ICD-10-CM

## 2017-07-08 DIAGNOSIS — I67850 Cerebral autosomal dominant arteriopathy with subcortical infarcts and leukoencephalopathy: Secondary | ICD-10-CM

## 2017-07-08 DIAGNOSIS — R2681 Unsteadiness on feet: Secondary | ICD-10-CM

## 2017-07-08 DIAGNOSIS — M25611 Stiffness of right shoulder, not elsewhere classified: Secondary | ICD-10-CM

## 2017-07-08 DIAGNOSIS — F01518 Vascular dementia, unspecified severity, with other behavioral disturbance: Secondary | ICD-10-CM

## 2017-07-08 DIAGNOSIS — R41841 Cognitive communication deficit: Secondary | ICD-10-CM

## 2017-07-08 DIAGNOSIS — I69318 Other symptoms and signs involving cognitive functions following cerebral infarction: Secondary | ICD-10-CM

## 2017-07-08 DIAGNOSIS — I69351 Hemiplegia and hemiparesis following cerebral infarction affecting right dominant side: Secondary | ICD-10-CM

## 2017-07-08 DIAGNOSIS — R2689 Other abnormalities of gait and mobility: Secondary | ICD-10-CM

## 2017-07-08 DIAGNOSIS — R471 Dysarthria and anarthria: Secondary | ICD-10-CM

## 2017-07-08 NOTE — Therapy (Signed)
[facility] [address] [place], [place], [address] Phone: [phone]   Fax:  [phone]  Speech Language Pathology Treatment  Patient Details  Name: Stephanie Lloyd MRN: [id number] Date of Birth: 1970-05-17 Referring Provider: Rosalin Hawking, MD   Encounter Date: 07/08/2017  End of Session - 07/08/17 1426    Visit Number  6    Number of Visits  18    Date for SLP Re-Evaluation  08/15/17    SLP Start Time  1319    SLP Stop Time   1401    SLP Time Calculation (min)  42 min    Activity Tolerance  Patient tolerated treatment well       Past Medical History:  Diagnosis Date  . Diabetes ([place])   . Diabetes mellitus without complication ([place])   . Diastolic dysfunction   . Hypertension   . Immune deficiency disorder ([place])   . Menometrorrhagia 04/01/2016  . Stroke [facility])     Past Surgical History:  Procedure Laterality Date  . NO PAST SURGERIES      There were no vitals filed for this visit.  Subjective Assessment - 07/08/17 1320    Subjective  "(Dr. Brett Fairy) is going to try to get me to have the - - genetic testing." Pt's modified barium swallow was WNL/WFL    Currently in Pain?  No/denies            ADULT SLP TREATMENT - 07/08/17 1321      General Information   Behavior/Cognition  Alert;Cooperative;Pleasant mood      Treatment Provided   Treatment provided  Cognitive-Linquistic      Cognitive-Linquistic Treatment   Treatment focused on  Dysarthria    Skilled Treatment  "Yes," pt responded when SLP asked her if she is still ok with this being our last visit. Pt did not recall any episodes since the last ST appointment that someone has not been able to understand her.  Pt completed her HEP with SBA, then she and SLP went outdoors adn pt maintained 100% intelligible speech  in 15 minutes conversation.       Assessment / Recommendations / Plan   Plan  -- d/c - pt satisfied with progress;transfer visits  to OT/PT      Progression Toward Goals   Progression toward goals  -- see goal update - d/c day       SLP Education - 07/08/17 1425    Education provided  Yes    Education Details  decr frequency of HEP in 4 weeks, to x2/week; maintain this frequency    Person(s) Educated  Patient    Methods  Explanation    Comprehension  Verbalized understanding       SLP Short Term Goals - 07/02/17 1427      SLP SHORT TERM GOAL #1   Title  pt will complete dysarthria (intelligibility) HEP with occasional min A    Baseline  mod A occasionally    Status  Achieved      SLP SHORT TERM GOAL #2   Title  pt will complete oral motor HEP with occasional min A    Baseline  occasional mod A    Status  Achieved      SLP SHORT TERM GOAL #3   Title  pt will demo dysarthria compensations in 10/20 sentences    Baseline  not attempted yet    Period  Weeks    Status  Achieved  SLP SHORT TERM GOAL #4   Title  Pt will use dysarthria compensations at least 75% in a simple 5 minute conversation    Baseline  50% of the time in 5 minutes inside ST room    Status  Achieved       SLP Long Term Goals - 07/08/17 [place] #1   Title  pt will complete HEP for dysarthria (intelligibility) with rare min A over 3 sessions    Baseline  occasional mod A    Time  --    Period  --    Status  Partially Met 2/3 sessions      SLP LONG TERM GOAL #2   Title  Pt will utilize compensations for dysarthria in simple-mod complex conversation over 5 minutes with rare min A x 3 visits    Baseline  pt not utilizing compensations for dysarthria;     Time  --    Period  Weeks    Status  Partially Met 2/3 visits       Plan - 07/08/17 1426    Clinical Impression Statement  Pt demonstrating carryover of compensations for dysarthria to improve intellgilbility. HEP was completed with SBA. Pt's modified (MBSS) yesterday was WNL. She will be d/c'd at this time from ST due to satisfied with progress.     Treatment/Interventions  Oral motor exercises;Pharyngeal strengthening exercises;Diet toleration management by SLP;Trials of upgraded texture/liquids;Internal/external aids;Multimodal communcation approach;Compensatory strategies;Patient/family education;Functional tasks;SLP instruction and feedback       Patient will benefit from skilled therapeutic intervention in order to improve the following deficits and impairments:   Dysarthria and anarthria  Cognitive communication deficit  SPEECH THERAPY DISCHARGE SUMMARY  Visits from Start of Care: 6  Current functional level related to goals / functional outcomes: Pt partially met all LTGs, with 2/3 visits of consistency with goal targets. Pt is satisfied with progress at this time and she agrees that d/c is warranted.   Remaining deficits: Mild dysarthria, mild cognitive communication deficits.   Education / Equipment: Refresher on compensations for dysarthria, refresher on HEP  Plan: Patient agrees to discharge.  Patient goals were partially met. Patient is being discharged due to being pleased with the current functional level.  ?????        Problem List Patient Active Problem List   Diagnosis Date Noted  . 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 Snoring 07/08/2017  . Cognitive decline 07/08/2017  . History of intracranial hemorrhage 05/12/2017  . Facial droop 04/18/2017  . CKD (chronic kidney disease), stage III ([place]) 04/18/2017  . Anxiety 01/16/2017  . Leg cramps 01/16/2017  . Hypoglycemia due to insulin 08/29/2016  . Diverticulosis 08/29/2016  . Diverticulitis 08/29/2016  . 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 OSA (obstructive sleep apnea) 06/10/2016  . Iron deficiency anemia due to chronic blood loss 04/01/2016  . Menometrorrhagia 04/01/2016  . Upper respiratory infection 03/25/2016  . History of  stroke   . History of intracerebral hemorrhage without residual deficit   . Cerebrovascular accident (CVA) due to thrombosis of basilar artery ([place]) 02/28/2016  . Ischemic stroke ([place]) 02/28/2016  . Acute kidney injury ([place]) 10/28/2015  . Acute encephalopathy 10/28/2015  . Diastolic dysfunction   . Stroke (cerebrum) ([place])   . Focal infarction of brain ([place]) 10/08/2015  . Syncope 08/19/2015  . Hypertensive emergency 07/29/2015  . Hypertensive urgency 07/28/2015  .  Hypokalemia 07/28/2015  . Accelerated hypertension 09/21/2014  . Type 2 diabetes mellitus with other circulatory complications ([place]) 40/37/5436  . Hyperlipidemia 09/21/2014  . Thalamic hemorrhage with stroke ([place]) 07/29/2014  . Hyperlipidemia LDL goal <70 07/29/2014  . Hemorrhagic stroke ([place]) 06/22/2014  . ICH (intracerebral hemorrhage) ([place]) 06/22/2014  . Essential hypertension 05/13/2012    [facility] ,[place], [place]  07/08/2017, 2:30 PM  [place] [address] [place], [place], [address] Phone: [phone]   Fax:  [phone]   Name: Stephanie Lloyd MRN: [id number] Date of Birth: [birth year]

## 2017-07-08 NOTE — Patient Instructions (Signed)
For the putty at home: Get rid of the pink and start using the green putty.  LONG HARD SQUEEZES - count to three while you squeeze.  THEN MAKE IT FAT and do it again. 20 times each hand, 2 sessions per day.  For folding laundry - sit at the kitchen table when you fold your laundry.  You did great in the clinic!!  Access Code: [email or web address]  URL: [email or web address]  Date: 07/08/2017  Prepared by: Mackie PaiKaren Shandel Busic   Program Notes     Exercises  Supine Shoulder Press - 10 reps - 2 sets - 0 seconds hold - 2x daily - 7x weekly  Supine Shoulder Overhead Flexion with Medicine Ball - 10 reps - 2 sets - 0 seconds hold - 2x daily - 7x weekly

## 2017-07-08 NOTE — Progress Notes (Signed)
SLEEP MEDICINE CLINIC   Provider:  Larey Seat, M.D.  Primary Care Physician:  Carollee Herter, Alferd Apa, DO   Referring Provider: Dr Erlinda Hong, stroke service - Venancio Poisson, NP     Chief Complaint  Patient presents with  . Follow-up    Rm 11.     HPI:  Stephanie Lloyd is a 47 y.o. female, seen here as in a referral  from Dr. Erlinda Hong of the [facility] health/ [place] stroke service.    Chief complaint according to patient : "see if strokes relate to apnea" .  I have the pleasure of seeing Mrs. Stephanie Lloyd, a 25 year old African-American right-handed female today on 08 July 2017 for a sleep study evaluation. She was last seen at [facility] in a stroke neurology follow-up visit with Dr. Rigoberto Noel on 12 May 2017.  She has a history of hypertension, diabetes mellitus and multiple strokes over the last 3 years.  She was first admitted on 22 Jun 2014 for acute onset of left-sided weakness, slurred speech and his 5-year at the time she had presented with a critical elevation in blood pressure of 250/136 mmHg.  Her CT showed a small right thalamic infarct actually an intracranial hemorrhage.  She received a Cardene drip and was observed while in the ICU.  Her hemoglobin A1c showed up as 9.0 she also had elevated creatinine.  Blood pressure has been controlled with medications, and diabetes has also been treated ever since.    She presented on  9/10 /2017, blood pressures at that time but not quoted here but she had developed right-sided numbness.  Her MRI showed acute infarct in the posterior inferior left midbrain and in the left-sided pons with multiple remote infarcts that had accumulated over the year.  Severe micro-bleeds were noticed multiple diffuse white matter changes.  Due to the extensive presence of microhemorrhages lacunar infarcts and white matter ischemic changes at such a young age Dr. Rigoberto Noel suspected that she may have cut herself.  Genetic testing was suggested.  I  Know  that genetic testing was not actually done -it was not affordable at the time.  Knowing that the CADASIL evaluation cost up to $6000. It was then that the patient actually had already mentioned that she snores but apnea had not been witnessed.  She declined a sleep study at the time. I will inquire today if we can perform an notch 3 evaluation.  She was again admitted on 28 February 2016 with slurred speech and now generalized weakness.  MRI showed no left paramedian pontine infarct small right insular cortex infarct and extensive old ischemic changes.  Echocardiogram was normal ejection fraction at 65% no shortness of breath, no dysmotility.  She was placed on aspirin 3 of 25 mg.  She would not be considered a good candidate for anticoagulation, genetic testing had at the time again not been approved by insurance.  04/17/2017 admission to [facility] for dysphagia, dysarthria and facial weakness.  MRI showed acute left periventricular white matter changes subacute anterior right frontal punctate bleeding infarcts, multiple old lacunar white matter thalamic and brainstem infarcts.  MRA is moderate stenosis of the right internal carotid artery, predominantly at the ophthalmic segment.  CTA head and neck showed mild cavernous right ICA narrowing.  Ejection fraction remained between 60 and 65% per echocardiogram.  LDL 130, HbA1c 7.0 both considered okay and control.  She continued on aspirin full-strength Plavix was not recommended due to the innumerable microhemorrhages.  Interval  history  6-11-2019No recurrent stroke since March, persitent mild slurred speech and at baseline left hemiparetic gait.  Blood pressure in last visit 130/85.   Sleep habits are as follows: " trouble to initiate sleep" "my mind is racing ' - beginning about 12-18 month ago. Snoring has worsened. Crescendo snoring, apnea witnessed.  Bedtime is between 9 and 10 PM.  The patient spends the last hour before she retreats to the  bedroom with watching TV.  She does not have an established routines or rituals to set the mind toward sleep.  She has begun taking medication to help her initiate sleep.  The bedroom is cool quiet and dark.  She prefers to sleep on her side, on 3 pillows for head/ body support and on a flat mattress.  She uses the pillows to keep her body in the lateral recumbent position. She only reports 1 nocturia nightly, and she can initiate sleep again after that bathroom break.  She rises in the morning at about 10 AM, spending almost 12 hours in bed. Her husband confirms that she is  may sleep 10 to 11 hours in a night. She realizes that her sleep habits and especially the hypersomnia component arose at the time of her strokes.  She does have  right hemispheric infarctions. She also has sometimes the need for a daytime nap, lasting 1 hour- she prefers around lunchtime on days where she does not have therapy or other appointments to attend.  She can suppress her sleepiness when physically active and mentally stimulated, she feels more sleepy  if she is at rest- and after activities.    Sleep medical history and family sleep history: no hypersomnia in the family, no known OSA, and no known insomnia.  Strokes - father and brother.    Social history: married,  One son 90 years old. Non smoker,non alcohol drinker, - caffeine:  coffee 1 cup, iced tea and / or soda with lunch and dinner when eating out.  3- 6  teas a week.    Review of Systems: Out of a complete 14 system review, the patient complains of only the following symptoms, and all other reviewed systems are negative. Slurred speech, facial asymmetry and hemiparesis with spasticity.   Epworth score 12 after 10 hours of sleep with naps.   ,  high Fatigue severity score 63  , depression score 4/ 15    Social History   Socioeconomic History  . Marital status: Married    Spouse name: Not on file  . Number of children: Not on file  . Years of  education: Not on file  . Highest education level: Not on file  Occupational History  . Not on file  Social Needs  . Financial resource strain: Not on file  . Food insecurity:    Worry: Not on file    Inability: Not on file  . Transportation needs:    Medical: Not on file    Non-medical: Not on file  Tobacco Use  . Smoking status: Never Smoker  . Smokeless tobacco: Never Used  Substance and Sexual Activity  . Alcohol use: No    Alcohol/week: 0.0 oz    Comment: occ  . Drug use: No  . Sexual activity: Yes    Birth control/protection: None  Lifestyle  . Physical activity:    Days per week: Not on file    Minutes per session: Not on file  . Stress: Not on file  Relationships  . Social connections:  Talks on phone: Not on file    Gets together: Not on file    Attends religious service: Not on file    Active member of club or organization: Not on file    Attends meetings of clubs or organizations: Not on file    Relationship status: Not on file  . Intimate partner violence:    Fear of current or ex partner: Not on file    Emotionally abused: Not on file    Physically abused: Not on file    Forced sexual activity: Not on file  Other Topics Concern  . Not on file  Social History Narrative   ** Merged History Encounter **        Family History  Problem Relation Age of Onset  . Diabetes Father   . Hypertension Father   . Stroke Father   . Diabetes Paternal Uncle   . Stroke Paternal Uncle   . Stroke Brother   . Cancer Neg Hx   . Breast cancer Neg Hx   . Ovarian cancer Neg Hx   . Colon cancer Neg Hx   . Esophageal cancer Neg Hx     Past Medical History:  Diagnosis Date  . Diabetes ([place])   . Diabetes mellitus without complication ([place])   . Diastolic dysfunction   . Hypertension   . Immune deficiency disorder ([place])   . Menometrorrhagia 04/01/2016  . Stroke [facility])     Past Surgical History:  Procedure Laterality Date  . NO PAST SURGERIES      Current  Outpatient Medications  Medication Sig Dispense Refill  . ACCU-CHEK SOFTCLIX LANCETS lancets Use as directed twice a day.  Dx Code: E11.9 100 each 1  . amLODipine (NORVASC) 10 MG tablet TAKE 1 TABLET BY MOUTH ONCE DAILY 30 tablet 11  . aspirin EC 325 MG EC tablet Take 1 tablet (325 mg total) by mouth daily. 30 tablet 11  . atorvastatin (LIPITOR) 80 MG tablet Take 1 tablet (80 mg total) by mouth daily at 6 PM. 90 tablet 0  . Blood Glucose Monitoring Suppl (ACCU-CHEK AVIVA PLUS) w/Device KIT Use as directed twice a day.  Dx Code: E11.9 1 kit 0  . fluticasone (FLONASE) 50 MCG/ACT nasal spray Place 2 sprays into both nostrils daily. 16 g 1  . glucose blood (ACCU-CHEK AVIVA PLUS) test strip Use as directed twice a day.  Dx Code: E11.9 100 each 1  . linagliptin (TRADJENTA) 5 MG TABS tablet Take 1 tablet (5 mg total) by mouth daily. 30 tablet 11  . lisinopril (PRINIVIL,ZESTRIL) 40 MG tablet Take 1 tablet (40 mg total) by mouth daily. 90 tablet 1  . pioglitazone (ACTOS) 30 MG tablet Take 1 tablet (30 mg total) by mouth daily. 30 tablet 2  . prednisoLONE acetate (PRED FORTE) 1 % ophthalmic suspension Place 1 drop into both eyes 2 (two) times daily.     . solifenacin (VESICARE) 10 MG tablet TAKE 1 TABLET BY MOUTH ONCE DAILY 30 tablet 2   No current facility-administered medications for this visit.     Allergies as of 07/08/2017 - Review Complete 07/08/2017  Allergen Reaction Noted  . Metformin and related Diarrhea 06/30/2014    Vitals: BP 116/77   Pulse 76   Ht '5\' 10"'  (1.778 m)   Wt 204 lb 8 oz (92.8 kg)   BMI 29.34 kg/m  Last Weight:  Wt Readings from Last 1 Encounters:  07/08/17 204 lb 8 oz (92.8 kg)   GNF:AOZH mass index  is 29.34 kg/m.     Last Height:   Ht Readings from Last 1 Encounters:  07/08/17 '5\' 10"'  (1.778 m)    Physical exam:  General: The patient is awake, alert and appears not in acute distress. The patient is well groomed. Head: Normocephalic, atraumatic. Neck is supple.  Mallampati 3  neck circumference: 13. 5 . Nasal airflow patent , Retrognathia is seen.  Cardiovascular:  Regular rate and rhythm , without  murmurs or carotid bruit, and without distended neck veins. Respiratory: Lungs are clear to auscultation. Skin:  Without evidence of edema, or rash Trunk: BMI is  The patient's posture is stooped.   Neurologic exam : The patient is awake and alert, oriented to place and time.   Memory subjective  described as intact.  Memory testing delegated to primary neurologist.  Attention span & concentration ability appears decreased  Speech is fluent, withdysarthria, dysphonia and mild aphasia.  Mood and affect are depressed   Cranial nerves: Pupils are equal and briskly reactive to light. Funduscopic exam without  evidence of pallor or edema.-   Extraocular movements  in vertical and horizontal planes intact and without nystagmus. Visual fields by finger perimetry are right sided restricted. Hearing to finger rub intact.   Facial sensation intact to fine touch.   Facial motor strength is asymmetric , left lower facial droop- and tongue deviated to the left ,  But her uvula moves in  midline.  Shoulder shrug was weaker on the right.    Motor exam:   Patchy elevation of  tone, muscle bulk and abnormal strength-  Weak grip bilaterally . Sensory:  Deferred.  Deep tendon reflexes: in the  upper and lower extremities are asymmetric , right side more brisk , hyper-reflexic patella and elbow , biceps. tact.   Assessment:  After physical and neurologic examination, review of laboratory studies,  Personal review of imaging studies, reports of other /same  Imaging studies, results of polysomnography and / or neurophysiology testing and pre-existing records as far as provided in visit., my assessment is   1) Mrs. 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  Of 3 years.  I do strongly suspect that she may have CADASIL in  spite of her lack of migraine headaches which are frequently associated.  They could be another, a  mitochondrial disease. -MELAS- I will look into new genetic testing as many companies now has new panels for a lower price on offer.  2) sleep apnea testing due to sleepiness high fatigue, snoring and a witnessed apnea. Testing is needed to control HTN, DM and hopefully manage the conditions as these are risk factors for stroke .   3) No evidence of embolism, atrial fibrillation, CHF or cardiomyopathy.    The patient was advised of the nature of the diagnosed disorder , the treatment options and the  risks for general health and wellness arising from not treating the condition.   I spent more than 50 minutes of face to face time with the patient.  Greater than 50% of time was spent in counseling and coordination of care. We have discussed the diagnosis and differential and I answered the patient's questions.    Plan:  Treatment plan and additional workup :  acute STROKE patient  ( April 17 2017) - attended SPLIT night study.   SPLIT study AHI 20, 3 % . Medicaid.  Vascular dementia - need MOCA with NP visit after sleep study .  Larey Seat, MD 09/03/9994, 72:27 PM  Certified in Neurology by ABPN Certified in [place] by [facility] [address] [place], [place] [address]

## 2017-07-08 NOTE — Patient Instructions (Signed)
Keep up doing the exercises and sentences x2/day for one more month, then do them x2/week.

## 2017-07-08 NOTE — Therapy (Signed)
[facility] [address] [place], [place], [address] Phone: [phone]   Fax:  [phone]  Occupational Therapy Treatment  Patient Details  Name: Stephanie Lloyd MRN: [id number] Date of Birth: 11/05/1970 Referring Provider: Marvel Plan (neurologist)   Encounter Date: 07/08/2017  OT End of Session - 07/08/17 1615    Visit Number  3    Number of Visits  9    Date for OT Re-Evaluation  08/19/17    Authorization Type  MCD     Authorization Time Period  06/25/17 - 08/19/17    Authorization - Visit Number  2    Authorization - Number of Visits  8    OT Start Time  1403    OT Stop Time  1445    OT Time Calculation (min)  42 min       Past Medical History:  Diagnosis Date  . Diabetes (HCC)   . Diabetes mellitus without complication (HCC)   . Diastolic dysfunction   . Hypertension   . Immune deficiency disorder (HCC)   . Menometrorrhagia 04/01/2016  . Stroke [facility])     Past Surgical History:  Procedure Laterality Date  . NO PAST SURGERIES      There were no vitals filed for this visit.  Subjective Assessment - 07/08/17 1405    Subjective   I don't think I can fold laundry    Pertinent History  Recent CVA 04/17/17. PMH: 05/2014 (affecting Lt side), 09/2015 (affecting Rt side), 01/2016 (both sides, Rt worse), HTN, DM2, Polyneuropathy, CKD stage III    Limitations  fall risk, no driving    Patient Stated Goals  folding laundry, writing     Currently in Pain?  No/denies                   OT Treatments/Exercises (OP) - 07/08/17 0001      ADLs   Home Maintenance  Addressed folding laundry - pt able to complete when sitting at a table with items closer to her so that she can use vision and table to compensate.  Pt had some difficulty folding hooded sweat shirt however able to do bedding, towels,t shrits and shorts. Pt to fold at home on ktichen table.        Hand Exercises   Other Hand Exercises  Upgraded  HEP for grip strength to green putty and reviewed with pt to ensure she gets full benefit of program. Pt able to return demonstrate after practice.  Therapeutic activity to address grip/pinch strength as well as coordination with each hand.       Neurological Re-education Exercises   Other Exercises 2  Neuro re ed to develop HEP to address shoulder flexion/overhead reach in supine - see pt instruction section for details.  Pt able to return demonstrate after instruction.              OT Education - 07/08/17 1612    Education Details  HEP for RUE, upgraded putty program,     Person(s) Educated  Patient    Methods  Explanation;Demonstration;Handout;Verbal cues    Comprehension  Verbalized understanding;Returned demonstration          OT Long Term Goals - 07/08/17 1613      OT LONG TERM GOAL #1   Title  Independent with coordination HEP and putty HEP for bilateral hands - 08/19/17    Baseline  Issued on previous admission but has not been doing since last year, will  need review/updates    Time  8    Period  Weeks    Status  Achieved      OT LONG TERM GOAL #2   Title  Improve coordination bilaterally as evidenced by reducing speed on 9 hole peg test by 5 sec. or more    Baseline  eval: Rt = 35.22 sec. sec, Lt = 41.53 sec    Time  8    Period  Weeks    Status  On-going      OT LONG TERM GOAL #3   Title  Grip strength to improve bilaterally by 5 lbs or greater to open jars/containers    Baseline  eval: RT = 46 lbs, Lt = 51 lbs    Time  8    Period  Weeks    Status  On-going      OT LONG TERM GOAL #4   Title  Pt to return to folding laundry with BUE's    Baseline  dependent    Time  8    Period  Weeks    Status  Achieved      OT LONG TERM GOAL #5   Title  Pt to demo increased Rt shoulder flexion to 115* or greater for overhead reaching     Baseline  100*    Time  8    Period  Weeks    Status  On-going      OT LONG TERM GOAL #6   Title  Pt to verbalize  understanding with A/E options for tying shoes, writing, and cooking to increase ease, independence, and safety with these tasks    Baseline  dependent     Time  8    Period  Weeks    Status  On-going            Plan - 07/08/17 1614    Clinical Impression Statement  Pt progressing toward goals. Pt with limited shoulder flexion in RUE due to malalignment and stiffness in shoulder girdle.      Occupational Profile and client history currently impacting functional performance  PMH: h/o multiple CVA'S (05/2014, 09/2015, 01/2016), HTN, DM2, CKD stage III    Occupational performance deficits (Please refer to evaluation for details):  ADL's;IADL's;Leisure    Rehab Potential  Good    OT Frequency  1x / week    OT Duration  8 weeks    OT Treatment/Interventions  Self-care/ADL training;Moist Heat;DME and/or AE instruction;Aquatic Therapy;Therapeutic activities;Therapeutic exercise;Cognitive remediation/compensation;Coping strategies training;Neuromuscular education;Functional Mobility Training;Passive range of motion;Manual Therapy;Patient/family education;Visual/perceptual remediation/compensation    Plan  HEP for Rt shoulder (pt developed frozen shoulder from stroke in Jan 2018), pt also to bring in tennis shoes to practice use of shoe buttons (pt may be able to tie shoes w/o AE)    Consulted and Agree with Plan of Care  Patient       Patient will benefit from skilled therapeutic intervention in order to improve the following deficits and impairments:  Decreased coordination, Decreased range of motion, Decreased coping skills, Decreased endurance, Decreased safety awareness, Decreased knowledge of precautions, Decreased activity tolerance, Impaired UE functional use, Decreased knowledge of use of DME, Decreased balance, Decreased cognition, Decreased mobility, Decreased strength, Impaired perceived functional ability, Impaired vision/preception  Visit Diagnosis: Unsteadiness on feet  Muscle  weakness (generalized)  Hemiplegia and hemiparesis following cerebral infarction affecting right dominant side (HCC)  Stiffness of right shoulder, not elsewhere classified  Other symptoms and signs involving cognitive functions following cerebral infarction  Other symptoms and signs involving the musculoskeletal system  Acute pain of right shoulder    Problem List Patient Active Problem List   Diagnosis Date Noted  . 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 Snoring 07/08/2017  . Cognitive decline 07/08/2017  . History of intracranial hemorrhage 05/12/2017  . Facial droop 04/18/2017  . CKD (chronic kidney disease), stage III (HCC) 04/18/2017  . Anxiety 01/16/2017  . Leg cramps 01/16/2017  . Hypoglycemia due to insulin 08/29/2016  . Diverticulosis 08/29/2016  . Diverticulitis 08/29/2016  . 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 OSA (obstructive sleep apnea) 06/10/2016  . Iron deficiency anemia due to chronic blood loss 04/01/2016  . Menometrorrhagia 04/01/2016  . Upper respiratory infection 03/25/2016  . History of stroke   . History of intracerebral hemorrhage without residual deficit   . Cerebrovascular accident (CVA) due to thrombosis of basilar artery (HCC) 02/28/2016  . Ischemic stroke (HCC) 02/28/2016  . Acute kidney injury (HCC) 10/28/2015  . Acute encephalopathy 10/28/2015  . Diastolic dysfunction   . Stroke (cerebrum) (HCC)   . Focal infarction of brain (HCC) 10/08/2015  . Syncope 08/19/2015  . Hypertensive emergency 07/29/2015  . Hypertensive urgency 07/28/2015  . Hypokalemia 07/28/2015  . Accelerated hypertension 09/21/2014  . Type 2 diabetes mellitus with other circulatory complications (HCC) 09/21/2014  . Hyperlipidemia 09/21/2014  . 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 Hyperlipidemia LDL goal <70 07/29/2014  .  Hemorrhagic stroke (HCC) 06/22/2014  . ICH (intracerebral hemorrhage) (HCC) 06/22/2014  . Essential hypertension 05/13/2012    Norton PastelPulaski, Rykker Coviello Halliday, OTR/L 07/08/2017, 4:17 PM  [facility] [address] [place], [place], [address] Phone: [phone]   Fax:  [phone]  Name: Stephanie Lloyd MRN: [id number] Date of Birth: 08/10/1970

## 2017-07-09 NOTE — Therapy (Signed)
[facility] [address] [place], [place], [address] Phone: [phone]   Fax:  [phone]  Physical Therapy Treatment  Patient Details  Name: Stephanie Lloyd MRN: [id number] Date of Birth: September 27, 1970 Referring Provider: Rosalin Hawking (neurologist)   Encounter Date: 07/08/2017  PT End of Session - 07/08/17 1809    Visit Number  6    Number of Visits  10    Authorization Type  Medicaid    Authorization Time Period   06/03/17-06/23/17 for 3 visits; 07/01/17-08/11/17 for 6 visits    Authorization - Visit Number  5    Authorization - Number of Visits  9    PT Start Time  3016    PT Stop Time  1608    PT Time Calculation (min)  38 min    Equipment Utilized During Treatment  Gait belt    Activity Tolerance  Patient tolerated treatment well;No increased pain    Behavior During Therapy  [facility] for tasks assessed/performed;Flat affect       Past Medical History:  Diagnosis Date  . Diabetes ([place])   . Diabetes mellitus without complication ([place])   . Diastolic dysfunction   . Hypertension   . Immune deficiency disorder ([place])   . Menometrorrhagia 04/01/2016  . Stroke [facility])     Past Surgical History:  Procedure Laterality Date  . NO PAST SURGERIES      There were no vitals filed for this visit.  Subjective Assessment - 07/08/17 1530    Subjective  She went to [place] to visit son. His home requires a lot of stairs but did okay.      Pertinent History  hx of CVA/ICH 03/2017, 01/2016, 09/2015 & 05/2014, HTN, DM2, polyneuropathy,  facial droop, CKD st III,     Limitations  Lifting;Walking;House hold activities;Standing    Patient Stated Goals  Be able to walk better & faster, do more activities    Currently in Pain?  No/denies                       [facility] Adult PT Treatment/Exercise - 07/08/17 1530      Transfers   Transfers  Sit to Stand;Stand to Sit    Sit to Stand  5: Supervision;With upper extremity  assist;With armrests;From chair/3-in-1    Stand to Sit  5: Supervision;With upper extremity assist;With armrests;To chair/3-in-1      Ambulation/Gait   Ambulation/Gait  Yes    Ambulation/Gait Assistance  5: Supervision    Ambulation/Gait Assistance Details  pt ambulating on paved surfaces only due to improper shoes for working on uneven terrain. Divided attention working on A-Z naming & scanning while ambulatin on pavement.      Ambulation Distance (Feet)  1200 Feet    Assistive device  None    Gait Pattern  Shuffle;Narrow base of support;Poor foot clearance - right;Trunk flexed;Lateral hip instability;Right flexed knee in stance;Left flexed knee in stance;Decreased arm swing - right;Decreased arm swing - left;Step-to pattern;Decreased stride length    Ambulation Surface  Indoor;Outdoor;Paved    Ramp  5: Supervision Min Guard     Curb  5: Supervision Min guard      High Level Balance   High Level Balance Activities  --    High Level Balance Comments  --      Knee/Hip Exercises: Aerobic   Stepper  --          Balance Exercises - 07/08/17 1530  Balance Exercises: Standing   Rockerboard  Anterior/posterior;Lateral;Head turns;EO;EC;30 seconds;10 reps;Other (comment) weight shifts with stabilization & recovery    Balance Beam  cross stance on blue foam beam: head turns eyes open, reaching forward & laterally. Alternate stepping & recovery anteriorly & posteriorly    Heel Raises Limitations  10 reps standing on foam & hold 3 seconds.     Toe Raise Limitations  10 reps standing on foam & hold 3 seconds.       Balance Exercises: Standing   Rebounder Limitations  balance board in corner with a chair in front for safety:  pt performed the following both ways on the balance board- rocking the board with emphasis on tall posture with EO, progressing to EC; then holding the board steady for EC head movements left<>right, up<>down and diagonals both ways. min to mod assist needed for balance  with cues on posture and anterior weight shifting as pt tended to loose balance in posterior direction.                                        PT Short Term Goals - 06/17/17 1524      PT SHORT TERM GOAL #1   Title  Patient demonstrates understanding of initial HEP. (All STGs Target Date 06/20/2017)    Baseline  MET 06/17/2017 Patient was able to demonstrate understanding of intial HEP.    Time  3    Period  Weeks    Status  Achieved      PT SHORT TERM GOAL #2   Title  Patient standing balance reaches 5" anteriorly & to floor without UE support with supervision.     Baseline  MET 06/17/2017  patient able to reach 5" anteriorly & to floor without UE support with supervision     Time  3    Period  Weeks    Status  Achieved      PT SHORT TERM GOAL #3   Title  Patient ambulates 200' without device with supervision.     Baseline  MET 06/17/2017     Time  3    Period  Weeks    Status  Achieved      PT SHORT TERM GOAL #4   Title  Patient ambulates 1000' without device outdoors including grass while performing simple naming cognitive tasks with supervision without balance loss.  (All NEW STGs Target Date 7th visit)     Baseline  06/17/2017 Patient ambulates 1000' without device on paved surfaces only while performing simple naming cognitive tasks with minA for balance loss.     Time  3    Period  Weeks    Status  New      PT SHORT TERM GOAL #5   Title  Timed Up & Go standard <13.5 sec    Baseline  TUG 18.02 sec    Time  3    Period  Weeks    Status  New      Additional Short Term Goals   Additional Short Term Goals  Yes      PT SHORT TERM GOAL #6   Title  Patient ambulates with head turns to scan environment without balance loss or change in pace which indicates fall risk. (All NEW STGs Target Date 7th visit)     Baseline  Patient has balance losses & significantly slows gait with head turns to scan  environment.     Time  3    Period  Weeks    Status  New        PT Long  Term Goals - 06/12/17 2154      PT LONG TERM GOAL #1   Title  Patient demonstrates & verbalizes understanding of ongoing HEP.    Baseline  Patient is dependent with appropriate exercises with medical conditions.     Time  3    Period  Months    Status  On-going      PT LONG TERM GOAL #2   Title  Berg Balance >45/56 to indicate lower fall risk.     Baseline  Berg Balance 34/56    Time  3    Period  Months    Status  On-going      PT LONG TERM GOAL #3   Title  Timed Up-Go standard <13.5seconds and cognitive TUG <17.5seconds to indicate lower fall risk.     Baseline  std TUG 18.02sec and cognitive TUG 21.17    Time  3    Period  Months    Status  On-going      PT LONG TERM GOAL #4   Title  Functional Gait Assessment >/= 15/30    Baseline  FGA 7/30    Time  3    Period  Months    Status  On-going      PT LONG TERM GOAL #5   Title  Gait Velocity >2.00 ft/sec to indicate improved functional mobility.    Baseline  Gait Velocity 1.53 ft/sec    Time  3    Period  Months    Status  On-going            Plan - 07/08/17 2026    Clinical Impression Statement  Patient improved gait with divided attention on paved surfaces. She has delayed step stretegy for balance recovery. Pt is on target to meet STGs next week.     Rehab Potential  Good    Clinical Impairments Affecting Rehab Potential  lives with husband who has disability and father who has dementia,     PT Frequency  1x / week 9 visits over 3 months    PT Duration  Other (comment) 9 visits over 3 months    PT Treatment/Interventions  ADLs/Self Care Home Management;DME Instruction;Gait training;Stair training;Functional mobility training;Therapeutic activities;Therapeutic exercise;Balance training;Neuromuscular re-education;Patient/family education;Passive range of motion;Vestibular;Canalith Repostioning    PT Next Visit Plan  Check STGs,  Neuromuscular Re-education & gait training with dual tasks; advance HEP as indicated     Consulted and Agree with Plan of Care  Patient       Patient will benefit from skilled therapeutic intervention in order to improve the following deficits and impairments:  Abnormal gait, Decreased activity tolerance, Decreased balance, Decreased coordination, Decreased endurance, Decreased mobility, Decreased range of motion, Decreased strength, Impaired flexibility, Postural dysfunction  Visit Diagnosis: Unsteadiness on feet  Muscle weakness (generalized)  Other symptoms and signs involving the musculoskeletal system  Other abnormalities of gait and mobility     Problem List Patient Active Problem List   Diagnosis Date Noted  . 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 Snoring 07/08/2017  . Cognitive decline 07/08/2017  . History of intracranial hemorrhage 05/12/2017  . Facial droop 04/18/2017  . CKD (chronic kidney disease), stage III ([place]) 04/18/2017  . Anxiety 01/16/2017  . Leg cramps 01/16/2017  . Hypoglycemia due to  insulin 08/29/2016  . Diverticulosis 08/29/2016  . Diverticulitis 08/29/2016  . 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 OSA (obstructive sleep apnea) 06/10/2016  . Iron deficiency anemia due to chronic blood loss 04/01/2016  . Menometrorrhagia 04/01/2016  . Upper respiratory infection 03/25/2016  . History of stroke   . History of intracerebral hemorrhage without residual deficit   . Cerebrovascular accident (CVA) due to thrombosis of basilar artery ([place]) 02/28/2016  . Ischemic stroke ([place]) 02/28/2016  . Acute kidney injury ([place]) 10/28/2015  . Acute encephalopathy 10/28/2015  . Diastolic dysfunction   . Stroke (cerebrum) ([place])   . Focal infarction of brain ([place]) 10/08/2015  . Syncope 08/19/2015  . Hypertensive emergency 07/29/2015  . Hypertensive urgency 07/28/2015  . Hypokalemia 07/28/2015  . Accelerated hypertension 09/21/2014   . Type 2 diabetes mellitus with other circulatory complications ([place]) 67/89/3810  . Hyperlipidemia 09/21/2014  . Thalamic hemorrhage with stroke ([place]) 07/29/2014  . Hyperlipidemia LDL goal <70 07/29/2014  . Hemorrhagic stroke ([place]) 06/22/2014  . ICH (intracerebral hemorrhage) ([place]) 06/22/2014  . Essential hypertension 05/13/2012    Jamey Reas PT, DPT 07/09/2017, 8:28 PM  [facility] [address] [place], [place], [address] Phone: [phone]   Fax:  [phone]  Name: Stephanie Lloyd MRN: [id number] Date of Birth: Apr 07, 1970

## 2017-07-16 ENCOUNTER — Ambulatory Visit: Payer: Medicaid Other | Admitting: Physical Therapy

## 2017-07-16 ENCOUNTER — Ambulatory Visit: Payer: Medicaid Other | Admitting: Occupational Therapy

## 2017-07-16 ENCOUNTER — Encounter: Payer: Self-pay | Admitting: Physical Therapy

## 2017-07-16 ENCOUNTER — Other Ambulatory Visit: Payer: Self-pay | Admitting: Family Medicine

## 2017-07-16 DIAGNOSIS — R278 Other lack of coordination: Secondary | ICD-10-CM | POA: Diagnosis not present

## 2017-07-16 DIAGNOSIS — I69351 Hemiplegia and hemiparesis following cerebral infarction affecting right dominant side: Secondary | ICD-10-CM

## 2017-07-16 DIAGNOSIS — M6281 Muscle weakness (generalized): Secondary | ICD-10-CM

## 2017-07-16 DIAGNOSIS — R2681 Unsteadiness on feet: Secondary | ICD-10-CM

## 2017-07-16 DIAGNOSIS — I69318 Other symptoms and signs involving cognitive functions following cerebral infarction: Secondary | ICD-10-CM

## 2017-07-16 DIAGNOSIS — Z09 Encounter for follow-up examination after completed treatment for conditions other than malignant neoplasm: Secondary | ICD-10-CM

## 2017-07-16 DIAGNOSIS — E1159 Type 2 diabetes mellitus with other circulatory complications: Secondary | ICD-10-CM

## 2017-07-16 DIAGNOSIS — R29898 Other symptoms and signs involving the musculoskeletal system: Secondary | ICD-10-CM

## 2017-07-16 DIAGNOSIS — R2689 Other abnormalities of gait and mobility: Secondary | ICD-10-CM

## 2017-07-16 DIAGNOSIS — M25611 Stiffness of right shoulder, not elsewhere classified: Secondary | ICD-10-CM

## 2017-07-16 NOTE — Therapy (Signed)
[facility] [address] [place], [place], [address] Phone: [phone]   Fax:  [phone]  Occupational Therapy Treatment  Patient Details  Name: Stephanie Lloyd MRN: [id number] Date of Birth: 17-Apr-1970 Referring Provider: Rosalin Hawking (neurologist)   Encounter Date: 07/16/2017  OT End of Session - 07/16/17 1217    Visit Number  4    Number of Visits  9    Date for OT Re-Evaluation  08/19/17    Authorization Type  MCD     Authorization Time Period  06/25/17 - 08/19/17    Authorization - Visit Number  3    Authorization - Number of Visits  8    OT Start Time  5102    OT Stop Time  1148    OT Time Calculation (min)  43 min    Activity Tolerance  Patient tolerated treatment well    Behavior During Therapy  [facility] for tasks assessed/performed;Flat affect       Past Medical History:  Diagnosis Date  . Diabetes ([place])   . Diabetes mellitus without complication ([place])   . Diastolic dysfunction   . Hypertension   . Immune deficiency disorder ([place])   . Menometrorrhagia 04/01/2016  . Stroke [facility])     Past Surgical History:  Procedure Laterality Date  . NO PAST SURGERIES      There were no vitals filed for this visit.  Subjective Assessment - 07/16/17 1108    Subjective   Pt reports she tied her shoes this am    Pertinent History  Recent CVA 04/17/17. PMH: 05/2014 (affecting Lt side), 09/2015 (affecting Rt side), 01/2016 (both sides, Rt worse), HTN, DM2, Polyneuropathy, CKD stage III    Limitations  fall risk, no driving    Patient Stated Goals  folding laundry, writing     Currently in Pain?  No/denies                   OT Treatments/Exercises (OP) - 07/16/17 0001      ADLs   LB Dressing  Pt demo tying each shoe in clinic - no longer needs A/E for this!    Cooking  Discussed safety re: cooking - pt had been d/c from O.T. last year with the recommendation of direct supervision for simple cooking, and using  chopper for dicing/cutting small vegetables. Re-inforced this recommendation, but also reviewed possible other A/E to make cooking easier and safer. Pt issued handouts on pot stabalizer, cut resistant gloves (or use of hand chopper), and different options for opening jars. Also discussed burn resistant gloves. Therapist gave clearance to perform microwaveable and cold meal prep/snack prep I'ly, but direct supervision when using stove/oven - pt agreed    Writing  Pt practiced writing with various pens - pt did best with built up pen. (Issued tan foam) Some of pt's difficulty w/ writing d/t expressive aphasia however      Exercises   Exercises  Shoulder      Shoulder Exercises: ROM/Strengthening   Other ROM/Strengthening Exercises  Reviewed shoulder ex's already issued in supine. Pt then shown ER/IR and sh. abd.  in supine with dowel. Pt also shown towel stretch for ER/IR RUE in standing - to issue next session                  OT Long Term Goals - 07/16/17 1217      OT LONG TERM GOAL #1   Title  Independent with coordination HEP and  putty HEP for bilateral hands - 08/19/17    Baseline  Issued on previous admission but has not been doing since last year, will need review/updates    Time  8    Period  Weeks    Status  Achieved      OT LONG TERM GOAL #2   Title  Improve coordination bilaterally as evidenced by reducing speed on 9 hole peg test by 5 sec. or more    Baseline  eval: Rt = 35.22 sec. sec, Lt = 41.53 sec    Time  8    Period  Weeks    Status  On-going      OT LONG TERM GOAL #3   Title  Grip strength to improve bilaterally by 5 lbs or greater to open jars/containers    Baseline  eval: RT = 46 lbs, Lt = 51 lbs    Time  8    Period  Weeks    Status  On-going      OT LONG TERM GOAL #4   Title  Pt to return to folding laundry with BUE's    Baseline  dependent    Time  8    Period  Weeks    Status  Achieved      OT LONG TERM GOAL #5   Title  Pt to demo increased  Rt shoulder flexion to 115* or greater for overhead reaching     Baseline  100*    Time  8    Period  Weeks    Status  On-going      OT LONG TERM GOAL #6   Title  Pt to verbalize understanding with A/E options for tying shoes, writing, and cooking to increase ease, independence, and safety with these tasks    Baseline  dependent     Time  8    Period  Weeks    Status  Achieved            Plan - 07/16/17 1217    Clinical Impression Statement  Pt met LTG #6 today. Pt progressing toward remaining goals. Pt with limited shoulder flexion in RUE due to malalignment and stiffness in shoulder girdle.      Occupational Profile and client history currently impacting functional performance  PMH: h/o multiple CVA'S (05/2014, 09/2015, 01/2016), HTN, DM2, CKD stage III    Occupational performance deficits (Please refer to evaluation for details):  ADL's;IADL's;Leisure    Rehab Potential  Good    OT Frequency  1x / week    OT Duration  8 weeks    OT Treatment/Interventions  Self-care/ADL training;Moist Heat;DME and/or AE instruction;Aquatic Therapy;Therapeutic activities;Therapeutic exercise;Cognitive remediation/compensation;Coping strategies training;Neuromuscular education;Functional Mobility Training;Passive range of motion;Manual Therapy;Patient/family education;Visual/perceptual remediation/compensation    Plan  Issued HEP for Rt shoulder (carry forward sh. flex w/ ball, ADD sh. abd and ER/IR in supine w/ dowel, and towel stretch), continue coordination and grip strength    Consulted and Agree with Plan of Care  Patient       Patient will benefit from skilled therapeutic intervention in order to improve the following deficits and impairments:  Decreased coordination, Decreased range of motion, Decreased coping skills, Decreased endurance, Decreased safety awareness, Decreased knowledge of precautions, Decreased activity tolerance, Impaired UE functional use, Decreased knowledge of use of DME,  Decreased balance, Decreased cognition, Decreased mobility, Decreased strength, Impaired perceived functional ability, Impaired vision/preception  Visit Diagnosis: Hemiplegia and hemiparesis following cerebral infarction affecting right dominant side (HCC)  Other symptoms  and signs involving cognitive functions following cerebral infarction  Stiffness of right shoulder, not elsewhere classified    Problem List Patient Active Problem List   Diagnosis Date Noted  . 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 Snoring 07/08/2017  . Cognitive decline 07/08/2017  . History of intracranial hemorrhage 05/12/2017  . Facial droop 04/18/2017  . CKD (chronic kidney disease), stage III ([place]) 04/18/2017  . Anxiety 01/16/2017  . Leg cramps 01/16/2017  . Hypoglycemia due to insulin 08/29/2016  . Diverticulosis 08/29/2016  . Diverticulitis 08/29/2016  . 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 OSA (obstructive sleep apnea) 06/10/2016  . Iron deficiency anemia due to chronic blood loss 04/01/2016  . Menometrorrhagia 04/01/2016  . Upper respiratory infection 03/25/2016  . History of stroke   . History of intracerebral hemorrhage without residual deficit   . Cerebrovascular accident (CVA) due to thrombosis of basilar artery ([facility]) 02/28/2016  . Ischemic stroke ([place]) 02/28/2016  . Acute kidney injury ([place]) 10/28/2015  . Acute encephalopathy 10/28/2015  . Diastolic dysfunction   . Stroke (cerebrum) ([place])   . Focal infarction of brain ([place]) 10/08/2015  . Syncope 08/19/2015  . Hypertensive emergency 07/29/2015  . Hypertensive urgency 07/28/2015  . Hypokalemia 07/28/2015  . Accelerated hypertension 09/21/2014  . Type 2 diabetes mellitus with other circulatory complications ([place]) 41/36/4383  . Hyperlipidemia 09/21/2014  . Thalamic hemorrhage with stroke ([place]) 07/29/2014  .  Hyperlipidemia LDL goal <70 07/29/2014  . Hemorrhagic stroke ([place]) 06/22/2014  . ICH (intracerebral hemorrhage) ([place]) 06/22/2014  . Essential hypertension 05/13/2012    Carey Bullocks, OTR/L 07/16/2017, 12:20 PM  [facility] [address] [place], [place], [address] Phone: [phone]   Fax:  [phone]  Name: Stephanie Lloyd MRN: [id number] Date of Birth: 11/30/1970

## 2017-07-17 NOTE — Therapy (Signed)
[facility] [address] [place], [place], [address] Phone: [phone]   Fax:  [phone]  Physical Therapy Treatment  Patient Details  Name: Stephanie Lloyd MRN: [id number] Date of Birth: 07-May-1970 Referring Provider: Rosalin Hawking (neurologist)   Encounter Date: 07/16/2017   07/16/17 1149  PT Visits / Re-Eval  Visit Number 7  Number of Visits 10  Authorization  Authorization Type Medicaid  Authorization Time Period  06/03/17-06/23/17 for 3 visits; 07/01/17-08/11/17 for 6 visits  Authorization - Visit Number 6  Authorization - Number of Visits 9  PT Time Calculation  PT Start Time 4742  PT Stop Time 1228  PT Time Calculation (min) 39 min  PT - End of Session  Equipment Utilized During Treatment Gait belt  Activity Tolerance Patient tolerated treatment well  Behavior During Therapy [facility] for tasks assessed/performed     Past Medical History:  Diagnosis Date  . Diabetes ([place])   . Diabetes mellitus without complication ([place])   . Diastolic dysfunction   . Hypertension   . Immune deficiency disorder ([place])   . Menometrorrhagia 04/01/2016  . Stroke [facility])     Past Surgical History:  Procedure Laterality Date  . NO PAST SURGERIES      There were no vitals filed for this visit.     07/16/17 1148  Symptoms/Limitations  Subjective No new complaints. No falls or pain to report.   Pertinent History hx of CVA/ICH 03/2017, 01/2016, 09/2015 & 05/2014, HTN, DM2, polyneuropathy,  facial droop, CKD st III,   Limitations Lifting;Walking;House hold activities;Standing  Patient Stated Goals Be able to walk better & faster, do more activities  Pain Assessment  Currently in Pain? No/denies      07/16/17 1202  High Level Balance  High Level Balance Activities Marching forwards;Marching backwards;Tandem walking (tandem fwd/bwd)  High Level Balance Comments on blue mat in parallel bars with intermittent touch for balance x 3  laps each with min guard to min assist with cues on posture, ex form and weight shifting.                     07/16/17 1212  Balance Exercises: Standing  Standing Eyes Closed Narrow base of support (BOS);Foam/compliant surface;Head turns;Other reps (comment);30 secs;Limitations  Tandem Gait Forward;Retro;Upper extremity support;3 reps;Limitations  Balance Exercises: Standing  Standing Eyes Closed Limitations standing across blue foam beam: feet together for EC no head movements, progressing to EC head movements left<>right, up<>down and diagonals both ways x 10 reps each. min assist for balance with occasional touch to bars for balance.   Tandem Gait Limitations tandem fwd/bwd on blue foam beam with heel taps to floor before putting foot on beam with bil light UE support on bars, min guard assist and cues on posture/ex form.       PT Short Term Goals - 07/16/17 1150      PT SHORT TERM GOAL #1   Title  Patient ambulates 1000' without device outdoors including grass while performing simple naming cognitive tasks with supervision without balance loss.  (All NEW STGs Target Date 7th visit)     Status  Achieved  (Pended)       PT SHORT TERM GOAL #2   Title  Timed Up & Go standard <13.5 sec    Baseline  11.66  (Pended)     Status  Achieved  (Pended)       PT SHORT TERM GOAL #3   Title  Patient ambulates with  head turns to scan environment without balance loss or change in pace which indicates fall risk. (All NEW STGs Target Date 7th visit)     Status  Partially Met  (Pended)       PT SHORT TERM GOAL #5   Time  3    Period  Weeks    Status  New      PT SHORT TERM GOAL #6   Baseline  Patient has balance losses & significantly slows gait with head turns to scan environment.     Time  3    Period  Weeks    Status  New        PT Long Term Goals - 06/12/17 2154      PT LONG TERM GOAL #1   Title  Patient demonstrates & verbalizes understanding of ongoing HEP.    Baseline  Patient  is dependent with appropriate exercises with medical conditions.     Time  3    Period  Months    Status  On-going      PT LONG TERM GOAL #2   Title  Berg Balance >45/56 to indicate lower fall risk.     Baseline  Berg Balance 34/56    Time  3    Period  Months    Status  On-going      PT LONG TERM GOAL #3   Title  Timed Up-Go standard <13.5seconds and cognitive TUG <17.5seconds to indicate lower fall risk.     Baseline  std TUG 18.02sec and cognitive TUG 21.17    Time  3    Period  Months    Status  On-going      PT LONG TERM GOAL #4   Title  Functional Gait Assessment >/= 15/30    Baseline  FGA 7/30    Time  3    Period  Months    Status  On-going      PT LONG TERM GOAL #5   Title  Gait Velocity >2.00 ft/sec to indicate improved functional mobility.    Baseline  Gait Velocity 1.53 ft/sec    Time  3    Period  Months    Status  On-going          07/16/17 1149  Plan  Clinical Impression Statement Today's skilled session continued to focus on balance reactions with emphasis on compliant surfaces with or without vision removed with continued need for external support for balance. Pt is progressing with less overall assistance needed. Pt should benefit from continued PT to progress toward unmet goals.                Pt will benefit from skilled therapeutic intervention in order to improve on the following deficits Abnormal gait;Decreased activity tolerance;Decreased balance;Decreased coordination;Decreased endurance;Decreased mobility;Decreased range of motion;Decreased strength;Impaired flexibility;Postural dysfunction  Rehab Potential Good  Clinical Impairments Affecting Rehab Potential lives with husband who has disability and father who has dementia,   PT Frequency 1x / week (9 visits over 3 months)  PT Duration Other (comment) (9 visits over 3 months)  PT Treatment/Interventions ADLs/Self Care Home Management;DME Instruction;Gait training;Stair training;Functional  mobility training;Therapeutic activities;Therapeutic exercise;Balance training;Neuromuscular re-education;Patient/family education;Passive range of motion;Vestibular;Canalith Repostioning  PT Next Visit Plan  Neuromuscular Re-education & gait training with dual tasks; advance HEP as indicated  Consulted and Agree with Plan of Care Patient         Patient will benefit from skilled therapeutic intervention in order to improve the following deficits  and impairments:  Abnormal gait, Decreased activity tolerance, Decreased balance, Decreased coordination, Decreased endurance, Decreased mobility, Decreased range of motion, Decreased strength, Impaired flexibility, Postural dysfunction  Visit Diagnosis: Unsteadiness on feet  Muscle weakness (generalized)  Other symptoms and signs involving the musculoskeletal system  Other abnormalities of gait and mobility     Problem List Patient Active Problem List   Diagnosis Date Noted  . 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 Snoring 07/08/2017  . Cognitive decline 07/08/2017  . History of intracranial hemorrhage 05/12/2017  . Facial droop 04/18/2017  . CKD (chronic kidney disease), stage III ([place]) 04/18/2017  . Anxiety 01/16/2017  . Leg cramps 01/16/2017  . Hypoglycemia due to insulin 08/29/2016  . Diverticulosis 08/29/2016  . Diverticulitis 08/29/2016  . 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 OSA (obstructive sleep apnea) 06/10/2016  . Iron deficiency anemia due to chronic blood loss 04/01/2016  . Menometrorrhagia 04/01/2016  . Upper respiratory infection 03/25/2016  . History of stroke   . History of intracerebral hemorrhage without residual deficit   . Cerebrovascular accident (CVA) due to thrombosis of basilar artery ([place]) 02/28/2016  . Ischemic stroke ([place]) 02/28/2016  . Acute kidney injury ([place]) 10/28/2015  .  Acute encephalopathy 10/28/2015  . Diastolic dysfunction   . Stroke (cerebrum) ([place])   . Focal infarction of brain ([place]) 10/08/2015  . Syncope 08/19/2015  . Hypertensive emergency 07/29/2015  . Hypertensive urgency 07/28/2015  . Hypokalemia 07/28/2015  . Accelerated hypertension 09/21/2014  . Type 2 diabetes mellitus with other circulatory complications ([place]) 29/79/8921  . Hyperlipidemia 09/21/2014  . Thalamic hemorrhage with stroke ([place]) 07/29/2014  . Hyperlipidemia LDL goal <70 07/29/2014  . Hemorrhagic stroke ([place]) 06/22/2014  . ICH (intracerebral hemorrhage) ([place]) 06/22/2014  . Essential hypertension 05/13/2012    Willow Ora, PTA, [facility] [address] [place], [place] [address] [phone] 07/17/17, 11:09 PM   Name: JERRIKA LEDLOW MRN: [id number] Date of Birth: 25-May-1970

## 2017-07-21 ENCOUNTER — Ambulatory Visit (INDEPENDENT_AMBULATORY_CARE_PROVIDER_SITE_OTHER): Payer: Medicaid Other | Admitting: Neurology

## 2017-07-21 DIAGNOSIS — F01518 Vascular dementia, unspecified severity, with other behavioral disturbance: Secondary | ICD-10-CM

## 2017-07-21 DIAGNOSIS — R4189 Other symptoms and signs involving cognitive functions and awareness: Secondary | ICD-10-CM

## 2017-07-21 DIAGNOSIS — R0683 Snoring: Secondary | ICD-10-CM

## 2017-07-21 DIAGNOSIS — G4710 Hypersomnia, unspecified: Secondary | ICD-10-CM | POA: Diagnosis not present

## 2017-07-21 DIAGNOSIS — F0151 Vascular dementia with behavioral disturbance: Secondary | ICD-10-CM

## 2017-07-21 DIAGNOSIS — I67850 Cerebral autosomal dominant arteriopathy with subcortical infarcts and leukoencephalopathy: Secondary | ICD-10-CM

## 2017-07-21 DIAGNOSIS — R5383 Other fatigue: Secondary | ICD-10-CM

## 2017-07-21 DIAGNOSIS — G4719 Other hypersomnia: Secondary | ICD-10-CM

## 2017-07-23 ENCOUNTER — Encounter: Payer: Self-pay | Admitting: Physical Therapy

## 2017-07-23 ENCOUNTER — Telehealth: Payer: Self-pay | Admitting: *Deleted

## 2017-07-23 ENCOUNTER — Ambulatory Visit: Payer: Medicaid Other | Admitting: Speech Pathology

## 2017-07-23 ENCOUNTER — Ambulatory Visit: Payer: Medicaid Other | Admitting: Occupational Therapy

## 2017-07-23 ENCOUNTER — Ambulatory Visit: Payer: Medicaid Other | Admitting: Physical Therapy

## 2017-07-23 DIAGNOSIS — M6281 Muscle weakness (generalized): Secondary | ICD-10-CM

## 2017-07-23 DIAGNOSIS — R2689 Other abnormalities of gait and mobility: Secondary | ICD-10-CM

## 2017-07-23 DIAGNOSIS — I69351 Hemiplegia and hemiparesis following cerebral infarction affecting right dominant side: Secondary | ICD-10-CM

## 2017-07-23 DIAGNOSIS — M25611 Stiffness of right shoulder, not elsewhere classified: Secondary | ICD-10-CM

## 2017-07-23 DIAGNOSIS — R278 Other lack of coordination: Secondary | ICD-10-CM

## 2017-07-23 DIAGNOSIS — R2681 Unsteadiness on feet: Secondary | ICD-10-CM

## 2017-07-23 DIAGNOSIS — R29898 Other symptoms and signs involving the musculoskeletal system: Secondary | ICD-10-CM

## 2017-07-23 NOTE — Telephone Encounter (Signed)
Received a one-page paper Claim Form for Continuing Disability Insurance Benefits [suppose to have envelope with it, not given]. Patient completed Part A and used Neuro Rehab dates and information; Part B is Doctor's statement to be completed; forwarded to provider/SLS 06/26

## 2017-07-23 NOTE — Patient Instructions (Signed)
   Cranial Flexion: Overhead Arm Extension - Supine ([facility])    Lie with knees bent, arms beyond head, holding beach ball. Pull ball up to above face. Repeat __10__ times per set. Do __1__ sets per session. Do __2__ sessions per day.  Abduction (Eccentric) - Active (Cane)    LAYING ON BACK: Lift cane out to Rt side with Rt palm up. Avoid hiking shoulder. Keep palm relaxed. Slowly lower affected arm for 3-5 seconds. _10__ reps per set, _2__ sets per day    ROM: External Rotation - Wand (Supine)    Lie on back holding wand with elbows bent to 90. Rotate forearms over head as far as possible.  Repeat _10___ times per set. Do __1__ sets per session. Do __2__ sessions per day.  ROM: Towel Stretch - with Interior Rotation    Pull left arm up behind back by pulling towel up with other arm. Hold __5__ seconds. Repeat __5__ times per set. Then repeat with right arm behind back. Repeat 5 times. Do __5__ sets per session EACH WAY. Do _2___ sessions per day.

## 2017-07-23 NOTE — Therapy (Signed)
[facility] [address] [place], [place], [address] Phone: [phone]   Fax:  [phone]  Physical Therapy Treatment  Patient Details  Name: Stephanie Lloyd MRN: [id number] Date of Birth: January 25, 1971 Referring Provider: Rosalin Hawking (neurologist)   Encounter Date: 07/23/2017  PT End of Session - 07/23/17 1218    Visit Number  8    Number of Visits  10    Authorization Type  Medicaid    Authorization Time Period   06/03/17-06/23/17 for 3 visits; 07/01/17-08/11/17 for 6 visits    Authorization - Visit Number  7    Authorization - Number of Visits  9    PT Start Time  1100    PT Stop Time  1145    PT Time Calculation (min)  45 min    Equipment Utilized During Treatment  Gait belt    Activity Tolerance  Patient tolerated treatment well    Behavior During Therapy  [facility] for tasks assessed/performed;Flat affect       Past Medical History:  Diagnosis Date  . Diabetes ([place])   . Diabetes mellitus without complication ( [place])   . Diastolic dysfunction   . Hypertension   . Immune deficiency disorder ([place])   . Menometrorrhagia 04/01/2016  . Stroke [facility])     Past Surgical History:  Procedure Laterality Date  . NO PAST SURGERIES      There were no vitals filed for this visit.  Subjective Assessment - 07/23/17 1149    Subjective  No new complaints. No falls or pain to report.     Pertinent History  hx of CVA/ICH 03/2017, 01/2016, 09/2015 & 05/2014, HTN, DM2, polyneuropathy,  facial droop, CKD st III,     Limitations  Lifting;Walking;House hold activities;Standing    Patient Stated Goals  Be able to walk better & faster, do more activities    Currently in Pain?  No/denies            [facility] Adult PT Treatment/Exercise - 07/23/17 1152      Ambulation/Gait   Ambulation/Gait  Yes    Ambulation/Gait Assistance  4: Min guard;4: Min assist    Ambulation/Gait Assistance Details  scanning with left<>right head turns. Min guard for  balance.    Assistive device  None    Ambulation Surface  Level;Indoor      High Level Balance   High Level Balance Activities  Side stepping;Head turns;Tandem walking;Backward walking toe and heel walking fwd/bwd     High Level Balance Comments  on blue mat in parallel bars with intermittent touch for balance x 3 laps each with min gaurd to min assist with cues on posture and foot placement. Other balance exs included: SLS on foam block while tossing ball to wall 30 secs x2 bil LE. Pt needed UE balance on chair.         Knee/Hip Exercises: Standing   Hip ADduction  AROM;Limitations;1 set;10 reps;Both;Strengthening    Hip ADduction Limitations  Needed vc for proper form and posture. Used 2# wts    Hip Extension  AROM;Both;1 set;10 reps;Limitations    Extension Limitations  pt needed vc for proper form and technique.          Balance Exercises - 07/23/17 1158      Balance Exercises: Standing   Standing Eyes Closed  Narrow base of support (BOS);Foam/compliant surface;Head turns;Other reps (comment);30 secs;Limitations    Gait with Head Turns  Forward;3 reps;Limitations    Tandem Gait  Forward;Retro;Intermittent upper  extremity support;Foam/compliant surface;3 reps    Sidestepping  Foam/compliant support;3 reps      Balance Exercises: Standing   Standing Eyes Closed Limitations  standing across blue foam beam: feet together for EC with head movements left<>right, up<>down and diagonals both ways 30 sec each. min assist for balance with occasional touch to bars for balance.     Tandem Gait Limitations  tandem bwd on blue beam with heal taps to floor before putting foot on beam with intermitent bil UE support on bars          PT Short Term Goals - 07/16/17 1150      PT SHORT TERM GOAL #1   Title  Patient ambulates 1000' without device outdoors including grass while performing simple naming cognitive tasks with supervision without balance loss.  (All NEW STGs Target Date 7th visit)      Status  Achieved  (Pended)       PT SHORT TERM GOAL #2   Title  Timed Up & Go standard <13.5 sec    Baseline  11.66  (Pended)     Status  Achieved  (Pended)       PT SHORT TERM GOAL #3   Title  Patient ambulates with head turns to scan environment without balance loss or change in pace which indicates fall risk. (All NEW STGs Target Date 7th visit)     Status  Partially Met  (Pended)       PT SHORT TERM GOAL #5   Time  3    Period  Weeks    Status  New      PT SHORT TERM GOAL #6   Baseline  Patient has balance losses & significantly slows gait with head turns to scan environment.     Time  3    Period  Weeks    Status  New        PT Long Term Goals - 06/12/17 2154      PT LONG TERM GOAL #1   Title  Patient demonstrates & verbalizes understanding of ongoing HEP.    Baseline  Patient is dependent with appropriate exercises with medical conditions.     Time  3    Period  Months    Status  On-going      PT LONG TERM GOAL #2   Title  Berg Balance >45/56 to indicate lower fall risk.     Baseline  Berg Balance 34/56    Time  3    Period  Months    Status  On-going      PT LONG TERM GOAL #3   Title  Timed Up-Go standard <13.5seconds and cognitive TUG <17.5seconds to indicate lower fall risk.     Baseline  std TUG 18.02sec and cognitive TUG 21.17    Time  3    Period  Months    Status  On-going      PT LONG TERM GOAL #4   Title  Functional Gait Assessment >/= 15/30    Baseline  FGA 7/30    Time  3    Period  Months    Status  On-going      PT LONG TERM GOAL #5   Title  Gait Velocity >2.00 ft/sec to indicate improved functional mobility.    Baseline  Gait Velocity 1.53 ft/sec    Time  3    Period  Months    Status  On-going  Plan - 07/23/17 1220    Clinical Impression Statement  Today's skilled session continued to focus on balance reactions with emphasis on compliant surfaces with vision removed with continued need for external support for  balance. Pt should benefit from continues PT to progress toward unmet goals.    Rehab Potential  Good    Clinical Impairments Affecting Rehab Potential  lives with husband who has disability and father who has dementia,     PT Frequency  1x / week 9 visits over 3 months    PT Duration  Other (comment) 9 visits over 3 months    PT Treatment/Interventions  ADLs/Self Care Home Management;DME Instruction;Gait training;Stair training;Functional mobility training;Therapeutic activities;Therapeutic exercise;Balance training;Neuromuscular re-education;Patient/family education;Passive range of motion;Vestibular;Canalith Repostioning    PT Next Visit Plan   Neuromuscular Re-education & gait training with dual tasks; advance HEP as indicated    Consulted and Agree with Plan of Care  Patient       Patient will benefit from skilled therapeutic intervention in order to improve the following deficits and impairments:  Abnormal gait, Decreased activity tolerance, Decreased balance, Decreased coordination, Decreased endurance, Decreased mobility, Decreased range of motion, Decreased strength, Impaired flexibility, Postural dysfunction  Visit Diagnosis: Other abnormalities of gait and mobility  Unsteadiness on feet  Muscle weakness (generalized)  Other symptoms and signs involving the musculoskeletal system     Problem List Patient Active Problem List   Diagnosis Date Noted  . 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 Snoring 07/08/2017  . Cognitive decline 07/08/2017  . History of intracranial hemorrhage 05/12/2017  . Facial droop 04/18/2017  . CKD (chronic kidney disease), stage III ([place]) 04/18/2017  . Anxiety 01/16/2017  . Leg cramps 01/16/2017  . Hypoglycemia due to insulin 08/29/2016  . Diverticulosis 08/29/2016  . Diverticulitis 08/29/2016  . 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 OSA (obstructive sleep apnea) 06/10/2016  . Iron deficiency anemia due to chronic blood loss 04/01/2016  . Menometrorrhagia 04/01/2016  . Upper respiratory infection 03/25/2016  . History of stroke   . History of intracerebral hemorrhage without residual deficit   . Cerebrovascular accident (CVA) due to thrombosis of basilar artery ([place]) 02/28/2016  . Ischemic stroke ([place]) 02/28/2016  . Acute kidney injury ([place]) 10/28/2015  . Acute encephalopathy 10/28/2015  . Diastolic dysfunction   . Stroke (cerebrum) ([place])   . Focal infarction of brain ([place]) 10/08/2015  . Syncope 08/19/2015  . Hypertensive emergency 07/29/2015  . Hypertensive urgency 07/28/2015  . Hypokalemia 07/28/2015  . Accelerated hypertension 09/21/2014  . Type 2 diabetes mellitus with other circulatory complications ([place]) 00/93/8182  . Hyperlipidemia 09/21/2014  . Thalamic hemorrhage with stroke ([place]) 07/29/2014  . Hyperlipidemia LDL goal <70 07/29/2014  . Hemorrhagic stroke ([place]) 06/22/2014  . ICH (intracerebral hemorrhage) ([place]) 06/22/2014  . Essential hypertension 05/13/2012    Halina Andreas, [place] 07/23/2017, 12:38 PM  [facility] [address] [place], [place], [address] Phone: [phone]   Fax:  [phone]  Name: Stephanie Lloyd MRN: [id number] Date of Birth: 13-Jun-1970  This note has been reviewed and edited by supervising CI. Adjusted count for Medicaid visit number.  Willow Ora, PTA, [facility] [address] [place], [place] [address] [phone] 07/24/17, 9:55 AM

## 2017-07-23 NOTE — Therapy (Signed)
[facility] [address] [place], [place], [address] Phone: [phone]   Fax:  [phone]  Occupational Therapy Treatment  Patient Details  Name: Stephanie Lloyd MRN: [id number] Date of Birth: 25-Jan-1971 Referring Provider: Marvel Plan (neurologist)   Encounter Date: 07/23/2017  OT End of Session - 07/23/17 1056    Visit Number  5    Number of Visits  9    Date for OT Re-Evaluation  08/19/17    Authorization Type  MCD     Authorization Time Period  06/25/17 - 08/19/17    Authorization - Visit Number  4    Authorization - Number of Visits  8    OT Start Time  1017    OT Stop Time  1100    OT Time Calculation (min)  43 min    Activity Tolerance  Patient tolerated treatment well    Behavior During Therapy  [facility] for tasks assessed/performed;Flat affect       Past Medical History:  Diagnosis Date  . Diabetes (HCC)   . Diabetes mellitus without complication (HCC)   . Diastolic dysfunction   . Hypertension   . Immune deficiency disorder (HCC)   . Menometrorrhagia 04/01/2016  . Stroke [facility])     Past Surgical History:  Procedure Laterality Date  . NO PAST SURGERIES      There were no vitals filed for this visit.  Subjective Assessment - 07/23/17 1026    Subjective   I'm back to folding laundry    Pertinent History  Recent CVA 04/17/17. PMH: 05/2014 (affecting Lt side), 09/2015 (affecting Rt side), 01/2016 (both sides, Rt worse), HTN, DM2, Polyneuropathy, CKD stage III    Limitations  fall risk, no driving    Patient Stated Goals  folding laundry, writing     Currently in Pain?  No/denies                   OT Treatments/Exercises (OP) - 07/23/17 0001      Shoulder Exercises: ROM/Strengthening   Other ROM/Strengthening Exercises  Pt issued updated shoulder HEP (from previous session's exercises) - see pt instructions for details. Pt performed each x 10 reps      Fine Motor Coordination (Hand/Wrist)    Fine Motor Coordination  Small Pegboard    Small Pegboard  Pt placing small pegs in pegboard with Lt hand, copying peg design, then removing pegs with Rt hand.  Pt with one error in copying design (off by 1 line for center part of design)            OT Education - 07/23/17 1047    Education Details  Updated HEP for RUE    Person(s) Educated  Patient    Methods  Explanation;Demonstration;Handout    Comprehension  Verbalized understanding;Returned demonstration          OT Long Term Goals - 07/16/17 1217      OT LONG TERM GOAL #1   Title  Independent with coordination HEP and putty HEP for bilateral hands - 08/19/17    Baseline  Issued on previous admission but has not been doing since last year, will need review/updates    Time  8    Period  Weeks    Status  Achieved      OT LONG TERM GOAL #2   Title  Improve coordination bilaterally as evidenced by reducing speed on 9 hole peg test by 5 sec. or more    Baseline  eval: Rt = 35.22 sec. sec, Lt = 41.53 sec    Time  8    Period  Weeks    Status  On-going      OT LONG TERM GOAL #3   Title  Grip strength to improve bilaterally by 5 lbs or greater to open jars/containers    Baseline  eval: RT = 46 lbs, Lt = 51 lbs    Time  8    Period  Weeks    Status  On-going      OT LONG TERM GOAL #4   Title  Pt to return to folding laundry with BUE's    Baseline  dependent    Time  8    Period  Weeks    Status  Achieved      OT LONG TERM GOAL #5   Title  Pt to demo increased Rt shoulder flexion to 115* or greater for overhead reaching     Baseline  100*    Time  8    Period  Weeks    Status  On-going      OT LONG TERM GOAL #6   Title  Pt to verbalize understanding with A/E options for tying shoes, writing, and cooking to increase ease, independence, and safety with these tasks    Baseline  dependent     Time  8    Period  Weeks    Status  Achieved            Plan - 07/23/17 1056    Clinical Impression Statement   Pt continues to make progress towards goals. Pt remains stiff RUE d/t frozen shoulder    Occupational Profile and client history currently impacting functional performance  PMH: h/o multiple CVA'S (05/2014, 09/2015, 01/2016), HTN, DM2, CKD stage III    Occupational performance deficits (Please refer to evaluation for details):  ADL's;IADL's;Leisure    Rehab Potential  Good    OT Frequency  1x / week    OT Duration  8 weeks    OT Treatment/Interventions  Self-care/ADL training;Moist Heat;DME and/or AE instruction;Aquatic Therapy;Therapeutic activities;Therapeutic exercise;Cognitive remediation/compensation;Coping strategies training;Neuromuscular education;Functional Mobility Training;Passive range of motion;Manual Therapy;Patient/family education;Visual/perceptual remediation/compensation    Plan  continue progress towards remaining goals, grip strength    Consulted and Agree with Plan of Care  Patient       Patient will benefit from skilled therapeutic intervention in order to improve the following deficits and impairments:  Decreased coordination, Decreased range of motion, Decreased coping skills, Decreased endurance, Decreased safety awareness, Decreased knowledge of precautions, Decreased activity tolerance, Impaired UE functional use, Decreased knowledge of use of DME, Decreased balance, Decreased cognition, Decreased mobility, Decreased strength, Impaired perceived functional ability, Impaired vision/preception  Visit Diagnosis: Hemiplegia and hemiparesis following cerebral infarction affecting right dominant side (HCC)  Stiffness of right shoulder, not elsewhere classified  Other lack of coordination    Problem List Patient Active Problem List   Diagnosis Date Noted  . 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 Snoring 07/08/2017  . Cognitive decline 07/08/2017  . History of intracranial hemorrhage 05/12/2017   . Facial droop 04/18/2017  . CKD (chronic kidney disease), stage III (HCC) 04/18/2017  . Anxiety 01/16/2017  . Leg cramps 01/16/2017  . Hypoglycemia due to insulin 08/29/2016  . Diverticulosis 08/29/2016  . Diverticulitis 08/29/2016  . 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 OSA (obstructive sleep apnea) 06/10/2016  . Iron deficiency  anemia due to chronic blood loss 04/01/2016  . Menometrorrhagia 04/01/2016  . Upper respiratory infection 03/25/2016  . History of stroke   . History of intracerebral hemorrhage without residual deficit   . Cerebrovascular accident (CVA) due to thrombosis of basilar artery (HCC) 02/28/2016  . Ischemic stroke (HCC) 02/28/2016  . Acute kidney injury (HCC) 10/28/2015  . Acute encephalopathy 10/28/2015  . Diastolic dysfunction   . Stroke (cerebrum) (HCC)   . Focal infarction of brain (HCC) 10/08/2015  . Syncope 08/19/2015  . Hypertensive emergency 07/29/2015  . Hypertensive urgency 07/28/2015  . Hypokalemia 07/28/2015  . Accelerated hypertension 09/21/2014  . Type 2 diabetes mellitus with other circulatory complications (HCC) 09/21/2014  . Hyperlipidemia 09/21/2014  . 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 Hyperlipidemia LDL goal <70 07/29/2014  . Hemorrhagic stroke (HCC) 06/22/2014  . ICH (intracerebral hemorrhage) (HCC) 06/22/2014  . Essential hypertension 05/13/2012    Kelli Churn, OTR/L 07/23/2017, 11:02 AM  [facility] [address] [place], [place], [address] Phone: [phone]   Fax:  [phone]  Name: Stephanie Lloyd MRN: [id number] Date of Birth: 07/06/1970

## 2017-07-24 ENCOUNTER — Telehealth: Payer: Self-pay | Admitting: Gastroenterology

## 2017-07-24 NOTE — Telephone Encounter (Signed)
Left message on machine to call back  

## 2017-07-24 NOTE — Telephone Encounter (Signed)
Patient states she is still having gi symptoms and is now become constipated. Patient would like some advice.

## 2017-07-25 NOTE — Telephone Encounter (Signed)
Left message on machine to call back  

## 2017-07-28 NOTE — Telephone Encounter (Signed)
Several message left for the pt to return call if she has further concerns.

## 2017-07-28 NOTE — Telephone Encounter (Signed)
LMOM with contact name and number for return call RE: paperwork ready for p/u during regular business hours/SLS 07/01

## 2017-07-30 ENCOUNTER — Ambulatory Visit: Payer: Medicaid Other | Admitting: Physical Therapy

## 2017-07-30 ENCOUNTER — Encounter: Payer: Self-pay | Admitting: Speech Pathology

## 2017-07-30 ENCOUNTER — Ambulatory Visit: Payer: Medicaid Other | Admitting: Occupational Therapy

## 2017-08-01 NOTE — Telephone Encounter (Signed)
Pt is aware that paperwork is ready for pickup, they called to make sure that it was faxed

## 2017-08-05 ENCOUNTER — Encounter: Payer: Self-pay | Admitting: Family Medicine

## 2017-08-05 ENCOUNTER — Ambulatory Visit (INDEPENDENT_AMBULATORY_CARE_PROVIDER_SITE_OTHER): Payer: Medicaid Other | Admitting: Family Medicine

## 2017-08-05 VITALS — BP 120/78 | HR 69 | Temp 97.6°F | Resp 16 | Ht 70.0 in | Wt 203.2 lb

## 2017-08-05 DIAGNOSIS — E1169 Type 2 diabetes mellitus with other specified complication: Secondary | ICD-10-CM | POA: Diagnosis not present

## 2017-08-05 DIAGNOSIS — I6930 Unspecified sequelae of cerebral infarction: Secondary | ICD-10-CM

## 2017-08-05 DIAGNOSIS — N3941 Urge incontinence: Secondary | ICD-10-CM | POA: Diagnosis not present

## 2017-08-05 DIAGNOSIS — I6302 Cerebral infarction due to thrombosis of basilar artery: Secondary | ICD-10-CM

## 2017-08-05 DIAGNOSIS — E1159 Type 2 diabetes mellitus with other circulatory complications: Secondary | ICD-10-CM | POA: Diagnosis not present

## 2017-08-05 DIAGNOSIS — E785 Hyperlipidemia, unspecified: Secondary | ICD-10-CM | POA: Diagnosis not present

## 2017-08-05 DIAGNOSIS — I67850 Cerebral autosomal dominant arteriopathy with subcortical infarcts and leukoencephalopathy: Secondary | ICD-10-CM

## 2017-08-05 DIAGNOSIS — I10 Essential (primary) hypertension: Secondary | ICD-10-CM | POA: Diagnosis not present

## 2017-08-05 LAB — LIPID PANEL
CHOL/HDL RATIO: 2
Cholesterol: 126 mg/dL (ref 0–200)
HDL: 59.7 mg/dL (ref >39.00)
LDL Cholesterol: 56 mg/dL (ref 0–99)
NonHDL: 66.73
TRIGLYCERIDES: 54 mg/dL (ref 0.0–149.0)
VLDL: 10.8 mg/dL (ref 0.0–40.0)

## 2017-08-05 LAB — COMPREHENSIVE METABOLIC PANEL
ALBUMIN: 4.3 g/dL (ref 3.5–5.2)
ALK PHOS: 91 U/L (ref 39–117)
ALT: 18 U/L (ref 0–35)
AST: 18 U/L (ref 0–37)
BILIRUBIN TOTAL: 0.3 mg/dL (ref 0.2–1.2)
BUN: 23 mg/dL (ref 6–23)
CALCIUM: 9.6 mg/dL (ref 8.4–10.5)
CO2: 31 meq/L (ref 19–32)
CREATININE: 1.22 mg/dL — AB (ref 0.40–1.20)
Chloride: 101 mEq/L (ref 96–112)
GFR: 60.18 mL/min (ref >60.00)
Glucose, Bld: 146 mg/dL — ABNORMAL HIGH (ref 70–99)
Potassium: 4.4 mEq/L (ref 3.5–5.1)
Sodium: 138 mEq/L (ref 135–145)
TOTAL PROTEIN: 7.4 g/dL (ref 6.0–8.3)

## 2017-08-05 MED ORDER — SOLIFENACIN SUCCINATE 10 MG PO TABS: 10.0000 mg | ORAL_TABLET | Freq: Every day | ORAL | 3 refills | Status: AC

## 2017-08-05 NOTE — Progress Notes (Signed)
Patient ID: Stephanie Lloyd, female    DOB: September 09, 1970  Age: 47 y.o. MRN: [id number]    Subjective:  Subjective  HPI Stephanie Lloyd presents for f/u bp and cholesterol  She is seeing endo for her dm.  Pt has no complaints today.    Review of Systems  Constitutional: Negative for chills and fever.  HENT: Negative for congestion and hearing loss.   Eyes: Negative for discharge.  Respiratory: Negative for cough and shortness of breath.   Cardiovascular: Negative for chest pain, palpitations and leg swelling.  Gastrointestinal: Negative for abdominal pain, blood in stool, constipation, diarrhea, nausea and vomiting.  Genitourinary: Negative for dysuria, frequency, hematuria and urgency.  Musculoskeletal: Negative for back pain and myalgias.  Skin: Negative for rash.  Allergic/Immunologic: Negative for environmental allergies.  Neurological: Negative for dizziness, weakness and headaches.  Hematological: Does not bruise/bleed easily.  Psychiatric/Behavioral: Negative for suicidal ideas. The patient is not nervous/anxious.     History Past Medical History:  Diagnosis Date  . Diabetes ([place])   . Diabetes mellitus without complication ([place])   . Diastolic dysfunction   . Hypertension   . Immune deficiency disorder ([place])   . Menometrorrhagia 04/01/2016  . Stroke [facility])     She has a past surgical history that includes No past surgeries.   Her family history includes Diabetes in her father and paternal uncle; Hypertension in her father; Stroke in her brother, father, and paternal uncle.She reports that she has never smoked. She has never used smokeless tobacco. She reports that she does not drink alcohol or use drugs.  Current Outpatient Medications on File Prior to Visit  Medication Sig Dispense Refill  . ACCU-CHEK SOFTCLIX LANCETS lancets Use as directed twice a day.  Dx Code: E11.9 100 each 1  . amLODipine (NORVASC) 10 MG tablet TAKE 1 TABLET BY MOUTH ONCE DAILY 30 tablet 11  .  aspirin EC 325 MG EC tablet Take 1 tablet (325 mg total) by mouth daily. 30 tablet 11  . atorvastatin (LIPITOR) 80 MG tablet Take 1 tablet (80 mg total) by mouth daily at 6 PM. 90 tablet 0  . Blood Glucose Monitoring Suppl (ACCU-CHEK AVIVA PLUS) w/Device KIT Use as directed twice a day.  Dx Code: E11.9 1 kit 0  . fluticasone (FLONASE) 50 MCG/ACT nasal spray Place 2 sprays into both nostrils daily. 16 g 1  . glucose blood (ACCU-CHEK AVIVA PLUS) test strip Use as directed twice a day.  Dx Code: E11.9 100 each 1  . linagliptin (TRADJENTA) 5 MG TABS tablet Take 1 tablet (5 mg total) by mouth daily. 30 tablet 11  . lisinopril (PRINIVIL,ZESTRIL) 40 MG tablet Take 1 tablet (40 mg total) by mouth daily. 90 tablet 1  . pioglitazone (ACTOS) 30 MG tablet TAKE 1 TABLET BY MOUTH ONCE DAILY 30 tablet 2  . prednisoLONE acetate (PRED FORTE) 1 % ophthalmic suspension Place 1 drop into both eyes 2 (two) times daily.      No current facility-administered medications on file prior to visit.      Objective:  Objective  Physical Exam  Constitutional: She is oriented to person, place, and time. She appears well-developed and well-nourished.  HENT:  Head: Normocephalic and atraumatic.  Eyes: Conjunctivae and EOM are normal.  Neck: Normal range of motion. Neck supple. No JVD present. Carotid bruit is not present. No thyromegaly present.  Cardiovascular: Normal rate, regular rhythm and normal heart sounds.  No murmur heard. Pulmonary/Chest: Effort normal and breath sounds normal.  No respiratory distress. She has no wheezes. She has no rales. She exhibits no tenderness.  Musculoskeletal: She exhibits no edema.  Neurological: She is alert and oriented to person, place, and time.  Psychiatric: She has a normal mood and affect.  Nursing note and vitals reviewed.  BP 120/78 (BP Location: Left Arm, Cuff Size: Normal)   Pulse 69   Temp 97.6 F (36.4 C) (Oral)   Resp 16   Ht '5\' 10"'  (1.778 m)   Wt 203 lb 3.2 oz  (92.2 kg)   SpO2 97%   BMI 29.16 kg/m  Wt Readings from Last 3 Encounters:  08/05/17 203 lb 3.2 oz (92.2 kg)  07/08/17 204 lb 8 oz (92.8 kg)  06/25/17 201 lb (91.2 kg)     Lab Results  Component Value Date   WBC 17.4 (H) 05/02/2017   HGB 13.1 05/02/2017   HCT 39.8 05/02/2017   PLT 194 05/02/2017   GLUCOSE 204 (H) 05/02/2017   CHOL 219 (H) 04/18/2017   TRIG 51 04/18/2017   HDL 79 04/18/2017   LDLCALC 130 (H) 04/18/2017   ALT 14 04/17/2017   AST 19 04/17/2017   NA 135 05/02/2017   K 3.4 (L) 05/02/2017   CL 102 05/02/2017   CREATININE 1.23 (H) 05/02/2017   BUN 17 05/02/2017   CO2 22 05/02/2017   TSH 0.969 08/30/2016   INR 1.11 04/17/2017   HGBA1C 6.6 05/30/2017    Dg Swallowing Func-speech Pathology  Result Date: 07/04/2017 Objective Swallowing Evaluation: Type of Study: MBS-Modified Barium Swallow Study  Patient Details Name: Stephanie Lloyd MRN: [id number] Date of Birth: [birth year] Today's Date: 07/04/2017 Time: SLP Start Time (ACUTE ONLY): 1115 -SLP Stop Time (ACUTE ONLY): 1130 SLP Time Calculation (min) (ACUTE ONLY): 15 min Past Medical History: Past Medical History: Diagnosis Date . Diabetes ([place])  . Diabetes mellitus without complication ([place])  . Diastolic dysfunction  . Hypertension  . Immune deficiency disorder ([place])  . Menometrorrhagia 04/01/2016 . Stroke [facility])  Past Surgical History: Past Surgical History: Procedure Laterality Date . NO PAST SURGERIES   HPI: Pt arrives for an outpt MBS, referred by GI due to report of occasional coughing/choking with liquids and solids (2x a day max). Pt has a history of 5 CVAs, most recent in 3/19. Pt has been attending OP SLP tx for dysarthria. Has has prior MBS for similar complaint on 03/18/16, found to have a delayed swallow, but no aspiration/penetration. Pt denies any pulmonary infections.  Subjective: The patient was seen sitting upright in bed.  Assessment / Plan / Recommendation CHL IP CLINICAL IMPRESSIONS 07/04/2017 Clinical Impression Pt  demonstrates normal swallow function. Challenged pt to consecutive sips, conversation while eating and drinking, mixed consistencies. No delay or weakness observed throughout. Reiterated basic aspriation precautions. No f/u needed to address swallowing.  SLP Visit Diagnosis Dysphagia, unspecified (R13.10) Attention and concentration deficit following -- Frontal lobe and executive function deficit following -- Impact on safety and function --   CHL IP TREATMENT RECOMMENDATION 07/04/2017 Treatment Recommendations Other (Comment)   Prognosis 04/18/2017 Prognosis for Safe Diet Advancement Good Barriers to Reach Goals -- Barriers/Prognosis Comment -- CHL IP DIET RECOMMENDATION 07/04/2017 SLP Diet Recommendations Regular solids;Thin liquid Liquid Administration via -- Medication Administration -- Compensations -- Postural Changes --   CHL IP OTHER RECOMMENDATIONS 03/18/2016 Recommended Consults -- Oral Care Recommendations Oral care BID Other Recommendations --   CHL IP FOLLOW UP RECOMMENDATIONS 04/18/2017 Follow up Recommendations Outpatient SLP;Home health SLP   CHL IP FREQUENCY AND  DURATION 04/18/2017 Speech Therapy Frequency (ACUTE ONLY) min 1 x/week Treatment Duration 2 weeks      CHL IP ORAL PHASE 07/04/2017 Oral Phase WFL Oral - Pudding Teaspoon -- Oral - Pudding Cup -- Oral - Honey Teaspoon -- Oral - Honey Cup -- Oral - Nectar Teaspoon -- Oral - Nectar Cup -- Oral - Nectar Straw -- Oral - Thin Teaspoon -- Oral - Thin Cup -- Oral - Thin Straw -- Oral - Puree -- Oral - Mech Soft -- Oral - Regular -- Oral - Multi-Consistency -- Oral - Pill -- Oral Phase - Comment --  CHL IP PHARYNGEAL PHASE 07/04/2017 Pharyngeal Phase WFL Pharyngeal- Pudding Teaspoon -- Pharyngeal -- Pharyngeal- Pudding Cup -- Pharyngeal -- Pharyngeal- Honey Teaspoon -- Pharyngeal -- Pharyngeal- Honey Cup -- Pharyngeal -- Pharyngeal- Nectar Teaspoon -- Pharyngeal -- Pharyngeal- Nectar Cup -- Pharyngeal -- Pharyngeal- Nectar Straw -- Pharyngeal -- Pharyngeal-  Thin Teaspoon -- Pharyngeal -- Pharyngeal- Thin Cup -- Pharyngeal -- Pharyngeal- Thin Straw -- Pharyngeal -- Pharyngeal- Puree -- Pharyngeal -- Pharyngeal- Mechanical Soft -- Pharyngeal -- Pharyngeal- Regular -- Pharyngeal -- Pharyngeal- Multi-consistency -- Pharyngeal -- Pharyngeal- Pill -- Pharyngeal -- Pharyngeal Comment --  CHL IP CERVICAL ESOPHAGEAL PHASE 07/04/2017 Cervical Esophageal Phase (No Data) Pudding Teaspoon -- Pudding Cup -- Honey Teaspoon -- Honey Cup -- Nectar Teaspoon -- Nectar Cup -- Nectar Straw -- Thin Teaspoon -- Thin Cup -- Thin Straw -- Puree -- Mechanical Soft -- Regular -- Multi-consistency -- Pill -- Cervical Esophageal Comment -- CHL IP GO 03/18/2016 Functional Assessment Tool Used asha noms, clinical judgment, MBS Functional Limitations Swallowing Swallow Current Status (Z6109) CI Swallow Goal Status (U0454) CI Swallow Discharge Status (U9811) CI Motor Speech Current Status (B1478) (None) Motor Speech Goal Status (G9562) (None) Motor Speech Goal Status (Z3086) (None) Spoken Language Comprehension Current Status (V7846) (None) Spoken Language Comprehension Goal Status (N6295) (None) Spoken Language Comprehension Discharge Status (M8413) (None) Spoken Language Expression Current Status (K4401) (None) Spoken Language Expression Goal Status (U2725) (None) Spoken Language Expression Discharge Status (D6644) (None) Attention Current Status (I3474) (None) Attention Goal Status (Q5956) (None) Attention Discharge Status (L8756) (None) Memory Current Status (E3329) (None) Memory Goal Status (J1884) (None) Memory Discharge Status (Z6606) (None) Voice Current Status (T0160) (None) Voice Goal Status (F0932) (None) Voice Discharge Status (T5573) (None) Other Speech-Language Pathology Functional Limitation Current Status (U2025) (None) Other Speech-Language Pathology Functional Limitation Goal Status (K2706) (None) Other Speech-Language Pathology Functional Limitation Discharge Status [id number]) (None)  Herbie Baltimore, MA CCC-SLP [phone] [name], Katherene Ponto 07/04/2017, 1:55 PM                Assessment & Plan:  Plan  I have changed Ethel N. Cumming's solifenacin. I am also having her maintain her aspirin, prednisoLONE acetate, ACCU-CHEK AVIVA PLUS, glucose blood, ACCU-CHEK SOFTCLIX LANCETS, linagliptin, lisinopril, fluticasone, amLODipine, atorvastatin, and pioglitazone.  Meds ordered this encounter  Medications  . solifenacin (VESICARE) 10 MG tablet    Sig: Take 1 tablet (10 mg total) by mouth daily.    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 Encouraged heart healthy diet such as the DASH diet and exercise as tolerated.       Relevant Orders   Comprehensive metabolic panel   Lipid panel   Hyperlipidemia LDL goal <70    Tolerating statin, encouraged heart healthy diet, avoid trans fats, minimize simple carbs and saturated fats. Increase exercise as tolerated      Type 2 diabetes mellitus with other circulatory complications ([place])    Per endo       Other Visit Diagnoses    Hyperlipidemia associated with type 2 diabetes mellitus ([place])       Relevant Orders   Comprehensive metabolic panel   Lipid panel   Urge incontinence of urine       Relevant Medications   solifenacin (VESICARE) 10 MG tablet   History of cerebrovascular accident (CVA) with residual deficit       Relevant Orders   Ambulatory referral to Genetics      Follow-up: Return in about 6 months (around 02/05/2018), or if symptoms worsen or fail to improve, for annual  exam, fasting.  Ann Held, DO

## 2017-08-05 NOTE — Assessment & Plan Note (Signed)
Tolerating statin, encouraged heart healthy diet, avoid trans fats, minimize simple carbs and saturated fats. Increase exercise as tolerated 

## 2017-08-05 NOTE — Assessment & Plan Note (Signed)
Per endo °

## 2017-08-05 NOTE — Assessment & Plan Note (Signed)
Per neuro 

## 2017-08-05 NOTE — Patient Instructions (Signed)
DASH Eating Plan DASH stands for "Dietary Approaches to Stop Hypertension." The DASH eating plan is a healthy eating plan that has been shown to reduce high blood pressure (hypertension). It may also reduce your risk for type 2 diabetes, heart disease, and stroke. The DASH eating plan may also help with weight loss. What are tips for following this plan? General guidelines  Avoid eating more than 2,300 mg (milligrams) of salt (sodium) a day. If you have hypertension, you may need to reduce your sodium intake to 1,500 mg a day.  Limit alcohol intake to no more than 1 drink a day for nonpregnant women and 2 drinks a day for men. One drink equals 12 oz of beer, 5 oz of wine, or 1 oz of hard liquor.  Work with your health care provider to maintain a healthy body weight or to lose weight. Ask what an ideal weight is for you.  Get at least 30 minutes of exercise that causes your heart to beat faster (aerobic exercise) most days of the week. Activities may include walking, swimming, or biking.  Work with your health care provider or diet and nutrition specialist (dietitian) to adjust your eating plan to your individual calorie needs. Reading food labels  Check food labels for the amount of sodium per serving. Choose foods with less than 5 percent of the Daily Value of sodium. Generally, foods with less than 300 mg of sodium per serving fit into this eating plan.  To find whole grains, look for the word "whole" as the first word in the ingredient list. Shopping  Buy products labeled as "low-sodium" or "no salt added."  Buy fresh foods. Avoid canned foods and premade or frozen meals. Cooking  Avoid adding salt when cooking. Use salt-free seasonings or herbs instead of table salt or sea salt. Check with your health care provider or pharmacist before using salt substitutes.  Do not fry foods. Cook foods using healthy methods such as baking, boiling, grilling, and broiling instead.  Cook with  heart-healthy oils, such as olive, canola, soybean, or sunflower oil. Meal planning   Eat a balanced diet that includes: ? 5 or more servings of fruits and vegetables each day. At each meal, try to fill half of your plate with fruits and vegetables. ? Up to 6-8 servings of whole grains each day. ? Less than 6 oz of lean meat, poultry, or fish each day. A 3-oz serving of meat is about the same size as a deck of cards. One egg equals 1 oz. ? 2 servings of low-fat dairy each day. ? A serving of nuts, seeds, or beans 5 times each week. ? Heart-healthy fats. Healthy fats called Omega-3 fatty acids are found in foods such as flaxseeds and coldwater fish, like sardines, salmon, and mackerel.  Limit how much you eat of the following: ? Canned or prepackaged foods. ? Food that is high in trans fat, such as fried foods. ? Food that is high in saturated fat, such as fatty meat. ? Sweets, desserts, sugary drinks, and other foods with added sugar. ? Full-fat dairy products.  Do not salt foods before eating.  Try to eat at least 2 vegetarian meals each week.  Eat more home-cooked food and less restaurant, buffet, and fast food.  When eating at a restaurant, ask that your food be prepared with less salt or no salt, if possible. What foods are recommended? The items listed may not be a complete list. Talk with your dietitian about what   dietary choices are best for you. Grains Whole-grain or whole-wheat bread. Whole-grain or whole-wheat pasta. Brown rice. Oatmeal. Quinoa. Bulgur. Whole-grain and low-sodium cereals. Pita bread. Low-fat, low-sodium crackers. Whole-wheat flour tortillas. Vegetables Fresh or frozen vegetables (raw, steamed, roasted, or grilled). Low-sodium or reduced-sodium tomato and vegetable juice. Low-sodium or reduced-sodium tomato sauce and tomato paste. Low-sodium or reduced-sodium canned vegetables. Fruits All fresh, dried, or frozen fruit. Canned fruit in natural juice (without  added sugar). Meat and other protein foods Skinless chicken or turkey. Ground chicken or turkey. Pork with fat trimmed off. Fish and seafood. Egg whites. Dried beans, peas, or lentils. Unsalted nuts, nut butters, and seeds. Unsalted canned beans. Lean cuts of beef with fat trimmed off. Low-sodium, lean deli meat. Dairy Low-fat (1%) or fat-free (skim) milk. Fat-free, low-fat, or reduced-fat cheeses. Nonfat, low-sodium ricotta or cottage cheese. Low-fat or nonfat yogurt. Low-fat, low-sodium cheese. Fats and oils Soft margarine without trans fats. Vegetable oil. Low-fat, reduced-fat, or light mayonnaise and salad dressings (reduced-sodium). Canola, safflower, olive, soybean, and sunflower oils. Avocado. Seasoning and other foods Herbs. Spices. Seasoning mixes without salt. Unsalted popcorn and pretzels. Fat-free sweets. What foods are not recommended? The items listed may not be a complete list. Talk with your dietitian about what dietary choices are best for you. Grains Baked goods made with fat, such as croissants, muffins, or some breads. Dry pasta or rice meal packs. Vegetables Creamed or fried vegetables. Vegetables in a cheese sauce. Regular canned vegetables (not low-sodium or reduced-sodium). Regular canned tomato sauce and paste (not low-sodium or reduced-sodium). Regular tomato and vegetable juice (not low-sodium or reduced-sodium). Pickles. Olives. Fruits Canned fruit in a light or heavy syrup. Fried fruit. Fruit in cream or butter sauce. Meat and other protein foods Fatty cuts of meat. Ribs. Fried meat. Bacon. Sausage. Bologna and other processed lunch meats. Salami. Fatback. Hotdogs. Bratwurst. Salted nuts and seeds. Canned beans with added salt. Canned or smoked fish. Whole eggs or egg yolks. Chicken or turkey with skin. Dairy Whole or 2% milk, cream, and half-and-half. Whole or full-fat cream cheese. Whole-fat or sweetened yogurt. Full-fat cheese. Nondairy creamers. Whipped toppings.  Processed cheese and cheese spreads. Fats and oils Butter. Stick margarine. Lard. Shortening. Ghee. Bacon fat. Tropical oils, such as coconut, palm kernel, or palm oil. Seasoning and other foods Salted popcorn and pretzels. Onion salt, garlic salt, seasoned salt, table salt, and sea salt. Worcestershire sauce. Tartar sauce. Barbecue sauce. Teriyaki sauce. Soy sauce, including reduced-sodium. Steak sauce. Canned and packaged gravies. Fish sauce. Oyster sauce. Cocktail sauce. Horseradish that you find on the shelf. Ketchup. Mustard. Meat flavorings and tenderizers. Bouillon cubes. Hot sauce and Tabasco sauce. Premade or packaged marinades. Premade or packaged taco seasonings. Relishes. Regular salad dressings. Where to find more information:  National Heart, Lung, and Blood Institute: www.nhlbi.nih.gov  American Heart Association: www.heart.org Summary  The DASH eating plan is a healthy eating plan that has been shown to reduce high blood pressure (hypertension). It may also reduce your risk for type 2 diabetes, heart disease, and stroke.  With the DASH eating plan, you should limit salt (sodium) intake to 2,300 mg a day. If you have hypertension, you may need to reduce your sodium intake to 1,500 mg a day.  When on the DASH eating plan, aim to eat more fresh fruits and vegetables, whole grains, lean proteins, low-fat dairy, and heart-healthy fats.  Work with your health care provider or diet and nutrition specialist (dietitian) to adjust your eating plan to your individual   calorie needs. This information is not intended to replace advice given to you by your health care provider. Make sure you discuss any questions you have with your health care provider. Document Released: 01/03/2011 Document Revised: 01/08/2016 Document Reviewed: 01/08/2016 Elsevier Interactive Patient Education  2018 Elsevier Inc.  

## 2017-08-05 NOTE — Assessment & Plan Note (Signed)
Well controlled, no changes to meds. Encouraged heart healthy diet such as the DASH diet and exercise as tolerated.  °

## 2017-08-05 NOTE — Assessment & Plan Note (Signed)
Per neuro Pt waiting for genetic testing

## 2017-08-06 ENCOUNTER — Encounter: Payer: Self-pay | Admitting: Physical Therapy

## 2017-08-06 ENCOUNTER — Ambulatory Visit: Payer: Medicaid Other | Admitting: Physical Therapy

## 2017-08-06 ENCOUNTER — Encounter: Payer: Self-pay | Admitting: Speech Pathology

## 2017-08-06 ENCOUNTER — Ambulatory Visit: Payer: Medicaid Other | Attending: Family Medicine | Admitting: Occupational Therapy

## 2017-08-06 DIAGNOSIS — M6281 Muscle weakness (generalized): Secondary | ICD-10-CM | POA: Diagnosis present

## 2017-08-06 DIAGNOSIS — I69351 Hemiplegia and hemiparesis following cerebral infarction affecting right dominant side: Secondary | ICD-10-CM | POA: Diagnosis present

## 2017-08-06 DIAGNOSIS — R2689 Other abnormalities of gait and mobility: Secondary | ICD-10-CM | POA: Diagnosis present

## 2017-08-06 DIAGNOSIS — R29898 Other symptoms and signs involving the musculoskeletal system: Secondary | ICD-10-CM | POA: Diagnosis present

## 2017-08-06 DIAGNOSIS — R278 Other lack of coordination: Secondary | ICD-10-CM | POA: Insufficient documentation

## 2017-08-06 DIAGNOSIS — R2681 Unsteadiness on feet: Secondary | ICD-10-CM | POA: Diagnosis present

## 2017-08-06 DIAGNOSIS — I69318 Other symptoms and signs involving cognitive functions following cerebral infarction: Secondary | ICD-10-CM | POA: Diagnosis present

## 2017-08-06 DIAGNOSIS — M25611 Stiffness of right shoulder, not elsewhere classified: Secondary | ICD-10-CM

## 2017-08-06 NOTE — Therapy (Signed)
[facility] [address] [place], [place], [address] Phone: [phone]   Fax:  [phone]  Occupational Therapy Treatment  Patient Details  Name: Stephanie Lloyd MRN: [id number] Date of Birth: 06/21/1970 Referring Provider: Marvel PlanXu, Jindong (neurologist)   Encounter Date: 08/06/2017  OT End of Session - 08/06/17 1112    Visit Number  6    Number of Visits  9    Date for OT Re-Evaluation  08/19/17    Authorization Type  MCD     Authorization Time Period  06/25/17 - 08/19/17    Authorization - Visit Number  5    Authorization - Number of Visits  8    OT Start Time  1105    OT Stop Time  1145    OT Time Calculation (min)  40 min    Activity Tolerance  Patient tolerated treatment well    Behavior During Therapy  [facility] for tasks assessed/performed;Flat affect       Past Medical History:  Diagnosis Date  . Diabetes (HCC)   . Diabetes mellitus without complication (HCC)   . Diastolic dysfunction   . Hypertension   . Immune deficiency disorder (HCC)   . Menometrorrhagia 04/01/2016  . Stroke [facility](HCC)     Past Surgical History:  Procedure Laterality Date  . NO PAST SURGERIES      There were no vitals filed for this visit.  Subjective Assessment - 08/06/17 1112    Subjective   denies pain    Pertinent History  Recent CVA 04/17/17. PMH: 05/2014 (affecting Lt side), 09/2015 (affecting Rt side), 01/2016 (both sides, Rt worse), HTN, DM2, Polyneuropathy, CKD stage III    Patient Stated Goals  folding laundry, writing     Currently in Pain?  No/denies                   Treatment: Standing to perform midrange shoulder flexion with UE ranger 2 sets of 10-15 reps, then circumduction min facilitation v.c. Low to midrange functional reaching to place graded clothespins on vertical antennae, min v.c        OT Education - 08/06/17 1216    Education Details  Reviewed updated HEP 10-15 reps, min v.c each   Person(s)  Educated  Patient    Methods  Explanation;Demonstration;Handout    Comprehension  Verbalized understanding;Returned demonstration          OT Long Term Goals - 07/16/17 1217      OT LONG TERM GOAL #1   Title  Independent with coordination HEP and putty HEP for bilateral hands - 08/19/17    Baseline  Issued on previous admission but has not been doing since last year, will need review/updates    Time  8    Period  Weeks    Status  Achieved      OT LONG TERM GOAL #2   Title  Improve coordination bilaterally as evidenced by reducing speed on 9 hole peg test by 5 sec. or more    Baseline  eval: Rt = 35.22 sec. sec, Lt = 41.53 sec    Time  8    Period  Weeks    Status  On-going      OT LONG TERM GOAL #3   Title  Grip strength to improve bilaterally by 5 lbs or greater to open jars/containers    Baseline  eval: RT = 46 lbs, Lt = 51 lbs    Time  8  Period  Weeks    Status  On-going      OT LONG TERM GOAL #4   Title  Pt to return to folding laundry with BUE's    Baseline  dependent    Time  8    Period  Weeks    Status  Achieved      OT LONG TERM GOAL #5   Title  Pt to demo increased Rt shoulder flexion to 115* or greater for overhead reaching     Baseline  100*    Time  8    Period  Weeks    Status  On-going      OT LONG TERM GOAL #6   Title  Pt to verbalize understanding with A/E options for tying shoes, writing, and cooking to increase ease, independence, and safety with these tasks    Baseline  dependent     Time  8    Period  Weeks    Status  Achieved            Plan - 08/06/17 1213    Clinical Impression Statement  Pt continues to make progress towards goals. She demonstrates understanding of HEP.    Occupational Profile and client history currently impacting functional performance  PMH: h/o multiple CVA'S (05/2014, 09/2015, 01/2016), HTN, DM2, CKD stage III    Occupational performance deficits (Please refer to evaluation for details):  ADL's;IADL's;Leisure     Rehab Potential  Good    OT Frequency  1x / week    OT Duration  8 weeks    OT Treatment/Interventions  Self-care/ADL training;Moist Heat;DME and/or AE instruction;Aquatic Therapy;Therapeutic activities;Therapeutic exercise;Cognitive remediation/compensation;Coping strategies training;Neuromuscular education;Functional Mobility Training;Passive range of motion;Manual Therapy;Patient/family education;Visual/perceptual remediation/compensation    Plan  continue progress towards remaining goals, grip strength    Consulted and Agree with Plan of Care  Patient       Patient will benefit from skilled therapeutic intervention in order to improve the following deficits and impairments:  Decreased coordination, Decreased range of motion, Decreased coping skills, Decreased endurance, Decreased safety awareness, Decreased knowledge of precautions, Decreased activity tolerance, Impaired UE functional use, Decreased knowledge of use of DME, Decreased balance, Decreased cognition, Decreased mobility, Decreased strength, Impaired perceived functional ability, Impaired vision/preception  Visit Diagnosis: Muscle weakness (generalized)  Other symptoms and signs involving the musculoskeletal system  Hemiplegia and hemiparesis following cerebral infarction affecting right dominant side (HCC)  Stiffness of right shoulder, not elsewhere classified  Other lack of coordination  Other symptoms and signs involving cognitive functions following cerebral infarction    Problem List Patient Active Problem List   Diagnosis Date Noted  . 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 Snoring 07/08/2017  . Cognitive decline 07/08/2017  . History of intracranial hemorrhage 05/12/2017  . Facial droop 04/18/2017  . CKD (chronic kidney disease), stage III (HCC) 04/18/2017  . Anxiety 01/16/2017  . Leg cramps 01/16/2017  . Hypoglycemia due to  insulin 08/29/2016  . Diverticulosis 08/29/2016  . Diverticulitis 08/29/2016  . 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 OSA (obstructive sleep apnea) 06/10/2016  . Iron deficiency anemia due to chronic blood loss 04/01/2016  . Menometrorrhagia 04/01/2016  . Upper respiratory infection 03/25/2016  . History of stroke   . History of intracerebral hemorrhage without residual deficit   . Cerebrovascular accident (CVA) due to thrombosis of basilar artery (HCC) 02/28/2016  . Ischemic stroke (HCC) 02/28/2016  . Acute kidney  injury (HCC) 10/28/2015  . Acute encephalopathy 10/28/2015  . Diastolic dysfunction   . Stroke (cerebrum) (HCC)   . Focal infarction of brain (HCC) 10/08/2015  . Syncope 08/19/2015  . Hypertensive emergency 07/29/2015  . Hypertensive urgency 07/28/2015  . Hypokalemia 07/28/2015  . Accelerated hypertension 09/21/2014  . Type 2 diabetes mellitus with other circulatory complications (HCC) 09/21/2014  . Hyperlipidemia 09/21/2014  . 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 Hyperlipidemia LDL goal <70 07/29/2014  . Hemorrhagic stroke (HCC) 06/22/2014  . ICH (intracerebral hemorrhage) (HCC) 06/22/2014  . Essential hypertension 05/13/2012    Stephanie Lloyd 08/06/2017, 12:23 PM  [facility] [address] [place], [place], [address] Phone: [phone]   Fax:  [phone]  Name: Stephanie Lloyd MRN: [id number] Date of Birth: 1970-02-04

## 2017-08-06 NOTE — Therapy (Signed)
[facility] [address] [place], [place], [address] Phone: [phone]   Fax:  [phone]  Physical Therapy Treatment  Patient Details  Name: Stephanie Lloyd MRN: [id number] Date of Birth: 01-30-1970 Referring Provider: Rosalin Hawking (neurologist)   Encounter Date: 08/06/2017  PT End of Session - 08/06/17 1428    Visit Number  9    Number of Visits  10    Authorization Type  Medicaid    Authorization Time Period   06/03/17-06/23/17 for 3 visits; 07/01/17-08/11/17 for 6 visits    Authorization - Visit Number  8    Authorization - Number of Visits  9    PT Start Time  8127    PT Stop Time  1230    PT Time Calculation (min)  43 min    Equipment Utilized During Treatment  Gait belt    Activity Tolerance  Patient tolerated treatment well    Behavior During Therapy  [facility] for tasks assessed/performed;Flat affect       Past Medical History:  Diagnosis Date  . Diabetes ([place])   . Diabetes mellitus without complication ([place])   . Diastolic dysfunction   . Hypertension   . Immune deficiency disorder ([place])   . Menometrorrhagia 04/01/2016  . Stroke [facility])     Past Surgical History:  Procedure Laterality Date  . NO PAST SURGERIES      There were no vitals filed for this visit.  Subjective Assessment - 08/06/17 1147    Subjective  no falls and no pain to report. Pt reports she has been doing hep and brought it with her today.    Pertinent History  hx of CVA/ICH 03/2017, 01/2016, 09/2015 & 05/2014, HTN, DM2, polyneuropathy,  facial droop, CKD st III,     Limitations  Lifting;Walking;House hold activities;Standing    Patient Stated Goals  Be able to walk better & faster, do more activities    Currently in Pain?  No/denies         [facility] PT Assessment - 08/06/17 1150      Berg Balance Test   Sit to Stand  Able to stand without using hands and stabilize independently    Standing Unsupported  Able to stand safely 2 minutes    Sitting with Back Unsupported but Feet Supported on Floor or Stool  Able to sit safely and securely 2 minutes    Stand to Sit  Sits safely with minimal use of hands    Transfers  Able to transfer safely, minor use of hands    Standing Unsupported with Eyes Closed  Able to stand 10 seconds safely    Standing Ubsupported with Feet Together  Able to place feet together independently and stand 1 minute safely    From Standing, Reach Forward with Outstretched Arm  Can reach confidently >25 cm (10")    From Standing Position, Pick up Object from Floor  Able to pick up shoe safely and easily    From Standing Position, Turn to Look Behind Over each Shoulder  Looks behind from both sides and weight shifts well    Turn 360 Degrees  Able to turn 360 degrees safely in 4 seconds or less    Standing Unsupported, Alternately Place Feet on Step/Stool  Able to stand independently and safely and complete 8 steps in 20 seconds    Standing Unsupported, One Foot in Front  Able to plae foot ahead of the other independently and hold 30 seconds  Standing on One Leg  Able to lift leg independently and hold 5-10 seconds    Total Score  54      Timed Up and Go Test   Normal TUG (seconds)  13.31    Cognitive TUG (seconds)  16.97      Functional Gait  Assessment   Gait Level Surface  Walks 20 ft in less than 7 sec but greater than 5.5 sec, uses assistive device, slower speed, mild gait deviations, or deviates 6-10 in outside of the 12 in walkway width.    Change in Gait Speed  Able to change speed, demonstrates mild gait deviations, deviates 6-10 in outside of the 12 in walkway width, or no gait deviations, unable to achieve a major change in velocity, or uses a change in velocity, or uses an assistive device.    Gait with Horizontal Head Turns  Performs head turns smoothly with slight change in gait velocity (eg, minor disruption to smooth gait path), deviates 6-10 in outside 12 in walkway width, or uses an assistive  device.    Gait with Vertical Head Turns  Performs task with slight change in gait velocity (eg, minor disruption to smooth gait path), deviates 6 - 10 in outside 12 in walkway width or uses assistive device    Gait and Pivot Turn  Pivot turns safely in greater than 3 sec and stops with no loss of balance, or pivot turns safely within 3 sec and stops with mild imbalance, requires small steps to catch balance.    Step Over Obstacle  Is able to step over 2 stacked shoe boxes taped together (9 in total height) without changing gait speed. No evidence of imbalance.    Gait with Narrow Base of Support  Ambulates 7-9 steps.    Gait with Eyes Closed  Walks 20 ft, uses assistive device, slower speed, mild gait deviations, deviates 6-10 in outside 12 in walkway width. Ambulates 20 ft in less than 9 sec but greater than 7 sec.    Ambulating Backwards  Walks 20 ft, uses assistive device, slower speed, mild gait deviations, deviates 6-10 in outside 12 in walkway width.    Steps  Alternating feet, must use rail.    Total Score  21         PT Short Term Goals - 07/16/17 1150      PT SHORT TERM GOAL #1   Title  Patient ambulates 1000' without device outdoors including grass while performing simple naming cognitive tasks with supervision without balance loss.  (All NEW STGs Target Date 7th visit)     Status  Achieved  (Pended)       PT SHORT TERM GOAL #2   Title  Timed Up & Go standard <13.5 sec    Baseline  11.66  (Pended)     Status  Achieved  (Pended)       PT SHORT TERM GOAL #3   Title  Patient ambulates with head turns to scan environment without balance loss or change in pace which indicates fall risk. (All NEW STGs Target Date 7th visit)     Status  Partially Met  (Pended)       PT SHORT TERM GOAL #5   Time  3    Period  Weeks    Status  New      PT SHORT TERM GOAL #6   Baseline  Patient has balance losses & significantly slows gait with head turns to scan environment.  Time  3     Period  Weeks    Status  New       PT Long Term Goals - 08/06/17 1220      PT LONG TERM GOAL #1   Title  Patient demonstrates & verbalizes understanding of ongoing HEP.    Baseline  goal met 08/06/17    Time  --    Period  --    Status  Achieved      PT LONG TERM GOAL #2   Title  Berg Balance >45/56 to indicate lower fall risk.     Baseline  08/06/17: Merrilee Jansky Balance 54/56    Time  --    Period  --    Status  Achieved      PT LONG TERM GOAL #3   Title  Timed Up-Go standard <13.5seconds and cognitive TUG <17.5seconds to indicate lower fall risk.     Baseline  08/06/17:  standard TUG 13.31,  cognitive TUG 16.97    Time  --    Period  --    Status  Achieved      PT LONG TERM GOAL #4   Title  Functional Gait Assessment >/= 15/30    Baseline  08/06/17; FGA 21/30    Time  --    Period  --    Status  Achieved      PT LONG TERM GOAL #5   Title  Gait Velocity >2.00 ft/sec to indicate improved functional mobility.    Baseline  08/06/17; Gait Velocity  2.59 ft/sec    Time  --    Period  --    Status  Achieved            Plan - 08/06/17 1429    Clinical Impression Statement  Today's skilled session focused on assessing LTG's so pt could be discharged. Pt has met all met LTG to date.     Rehab Potential  Good    Clinical Impairments Affecting Rehab Potential  lives with husband who has disability and father who has dementia,     PT Frequency  1x / week 9 visits over 3 months    PT Duration  Other (comment) 9 visits over 3 months    PT Treatment/Interventions  ADLs/Self Care Home Management;DME Instruction;Gait training;Stair training;Functional mobility training;Therapeutic activities;Therapeutic exercise;Balance training;Neuromuscular re-education;Patient/family education;Passive range of motion;Vestibular;Canalith Repostioning    PT Next Visit Plan  Pt d/c today.    Consulted and Agree with Plan of Care  Patient       Patient will benefit from skilled therapeutic  intervention in order to improve the following deficits and impairments:  Abnormal gait, Decreased activity tolerance, Decreased balance, Decreased coordination, Decreased endurance, Decreased mobility, Decreased range of motion, Decreased strength, Impaired flexibility, Postural dysfunction  Visit Diagnosis: Muscle weakness (generalized)  Other symptoms and signs involving the musculoskeletal system  Other abnormalities of gait and mobility  Unsteadiness on feet     Problem List Patient Active Problem List   Diagnosis Date Noted  . 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 Snoring 07/08/2017  . Cognitive decline 07/08/2017  . History of intracranial hemorrhage 05/12/2017  . Facial droop 04/18/2017  . CKD (chronic kidney disease), stage III ([place]) 04/18/2017  . Anxiety 01/16/2017  . Leg cramps 01/16/2017  . Hypoglycemia due to insulin 08/29/2016  . Diverticulosis 08/29/2016  . Diverticulitis 08/29/2016  . 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  OSA (obstructive sleep apnea) 06/10/2016  . Iron deficiency anemia due to chronic blood loss 04/01/2016  . Menometrorrhagia 04/01/2016  . Upper respiratory infection 03/25/2016  . History of stroke   . History of intracerebral hemorrhage without residual deficit   . Cerebrovascular accident (CVA) due to thrombosis of basilar artery ([place]) 02/28/2016  . Ischemic stroke ([place]) 02/28/2016  . Acute kidney injury ([place]) 10/28/2015  . Acute encephalopathy 10/28/2015  . Diastolic dysfunction   . Stroke (cerebrum) ([place])   . Focal infarction of brain ([place]) 10/08/2015  . Syncope 08/19/2015  . Hypertensive emergency 07/29/2015  . Hypertensive urgency 07/28/2015  . Hypokalemia 07/28/2015  . Accelerated hypertension 09/21/2014  . Type 2 diabetes mellitus with other circulatory complications ([place]) 29/93/7169  . Hyperlipidemia  09/21/2014  . Thalamic hemorrhage with stroke ([place]) 07/29/2014  . Hyperlipidemia LDL goal <70 07/29/2014  . Hemorrhagic stroke ([place]) 06/22/2014  . ICH (intracerebral hemorrhage) ([place]) 06/22/2014  . Essential hypertension 05/13/2012    Halina Andreas, [place] 08/06/2017, 2:59 PM  [facility] [address] [place], [place], [address] Phone: [phone]   Fax:  [phone]  Name: FAYLYNN STAMOS MRN: [id number] Date of Birth: Jan 01, 1971

## 2017-08-07 NOTE — Telephone Encounter (Signed)
Was not stated to do on Billing Form, only to call for p/u and was not asked of me when I called patient to inform ready for p/u. I will fax today/SLS 07/11

## 2017-08-08 NOTE — Progress Notes (Deleted)
STROKE NEUROLOGY FOLLOW UP NOTE  NAME: AMBERT VIRRUETA DOB: [birth year]  REASON FOR VISIT: stroke follow up HISTORY FROM: pt and chart  Today we had the pleasure of seeing MAYSON STERBENZ in follow-up at our Neurology Clinic. Pt was accompanied by husband.   History Summary Cilicia Borden is a 47 y.o. female with PMH of HTN, DM was admitted on 06/22/14 for acute onset of left sided weakness, slurry speech and dysphagia. 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 She was admitted to neuro ICU with Cardene drip and repeat CT head secondary showed stable hematoma. Other stroke workup showed negative 2-D echo and carotid Doppler, however LDL 140 and A1c 9.0. She was started on Lipitor. She also had elevated creatinine. Her BP was further controlled by po meds. After stabilization, patient was discharged home with PCP follow-up in one week.   09/21/15 follow up - the patient has been doing well. Initially she needs walker for walking, but right now she is able to walk without any device. Left-sided weakness much resolved with only mild left hand dexterity difficulties. Her blood pressure today 116/73, and she reported compliant with medication. She has his pediatrics follow-up next months. She admitted snoring at night, however denies apnea. She declined a sleep study.  Admitted on 10/08/15 for right-sided numbness. MRI showed acute infarct within the posterior inferior left midbrain and pons on the left with multiple remote infarcts. Severe diffuse microbleds and ischemic white matter changes. Due to extensive microbleds, lacunar infarcts, significant white matter ischemic changes at such young age, single gene mutation associated vasculopathy such as COL4A1 and COL4A2 and CADASIL and CARASIL need to be considered. Genetic testing would be more informative. CTA H&N - High-grade left P2 segment stenosis. TTE unremarkable. LDL 102 and A1C 8.5. Put on ASA 81mg  and continued  pravastatin.   Admitted again on 02/28/16 for slurred speech and generalized weakness. MRI showed left paramedian pontine infarct, small right insular cortex infarct, and extensive old ischemic changes. EF 60-65% and no SOE. Increased ASA to 325mg . pt is not a good candidate for anticoagulation due to hx of ICH and extensive microbleds on MRI, also did not recommend plavix for the same reason. LDL 99, changed to lipitor 80. Genetic testing not approved by insurance.  06/10/16  Follow up - pt has been doing well. Do not have BP machine at home but today BP good at 110/76. Her glucose not in good control, especially after meal. She is on 70/30 bid. Recommend to follow up with PCP to consider premeal insulin or refer to endocrinology. She has not been on lipitor since discharge, will refill. Pt still has mild dysarthria and right deltoid intermittent pain, but no weakness or numbness.  04/17/17 admission to [facility] for dysphagia, dysarthria and facial weakness. MRI acute left periventricular WM and subacute anterior right frontal punctate infarcts.  Multiple old lacunar white matter, thalamic and brainstem infarcts.  Innumerable chronic hemorrhages.  MRA moderate stenosis of the distal right ICA, predominantly in the glenoid and ophthalmic segments.  CTA head and neck mild cavernous right ICA narrowing.  EF 60-65%.  LDL 130 and A1c 7.0.  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  Continue aspirin 325, do not recommend Plavix at that time due to innumerable microhemorrhages and a history of a right brain ICH.  05/12/17 follow up JX: During the interval time,  Patient has been doing well.  No recurrent stroke.  Still has mild slurred speech, and baseline left hemiparetic gait.  Going to have PT next week, but will order speech therapy too.  Had rectal bleeding 05/02/17 due to constipation and laxative use.  BP today 130/84.  08/11/17 UPDATE:  Patient did have recent follow-up appointment with Dr. Vickey Huger for OSA and did have a split-night study on 07/21/2017 which did show mild obstructive sleep apnea and recommended CPAP titration study in order to optimize therapy.   REVIEW OF SYSTEMS: Full 14 system review of systems performed and notable only for those listed below and in HPI above, all others are negative:  Constitutional: Activity change, fatigue Cardiovascular: Leg swelling, murmur Ear/Nose/Throat: Trouble swallowing Skin:  Eyes:  Blurry vision, loss of vision, light sensitivity Respiratory: Choking Gastroitestinal: Abdominal pain, rectal bleeding, constipation, vomiting Genitourinary: incontinence of bladder, difficulty urination Hematology/Lymphatic:  anemia Endocrine: heat intolerance, cold intolerance Musculoskeletal: Joint pain, back pain, aching muscles, walking difficulty, neck pain Allergy/Immunology:   Neurological:  Facial drooping, memory loss, weakness, slurry speech, dizziness Psychiatric: confusion, agitation, nervous/anxious Sleep: Insomnia, snoring  The following represents the patient's updated allergies and side effects list: Allergies  Allergen Reactions  . Metformin And Related Diarrhea    The neurologically relevant items on the patient's problem list were reviewed on today's visit.  Neurologic Examination  A problem focused neurological exam (12 or more points of the single system neurologic examination, vital signs counts as 1 point, cranial nerves count for 8 points) was performed.  There were no vitals taken for this visit.  General - Well nourished, well developed, in no apparent distress.  Ophthalmologic - Sharp disc margins OU.  Cardiovascular - Regular rate and rhythm with no murmur.  Mental Status -  Level of arousal and orientation to time, place, and person were intact. Language including expression, naming, repetition, comprehension was assessed and found intact, mild  dysarthria. Fund of Knowledge was assessed and was intact.  Cranial Nerves II - XII - II - Visual field intact OU. III, IV, VI - Extraocular movements intact. V - Facial sensation intact bilaterally. VII - Facial movement intact bilaterally. VIII - Hearing & vestibular intact bilaterally. X - Palate elevates symmetrically, mild dysarthria. XI - Chin turning & shoulder shrug intact bilaterally. XII - Tongue protrusion intact.  Motor Strength - The patient's strength was normal in all extremities and pronator drift was absent.  Bulk was normal and fasciculations were absent.   Motor Tone - Muscle tone was assessed at the neck and appendages and was normal.  Reflexes - The patient's reflexes were 1+ in all extremities and she had no pathological reflexes.  Sensory - Light touch, temperature/pinprick were assessed and were normal.    Coordination - The patient had normal movements in the hands and feet with no ataxia or dysmetria.  Tremor was absent.  Gait and Station - mild left hemiparetic gait  Data reviewed: I personally reviewed the images and agree with the radiology interpretations.   Ct Angio Head W Or Wo Contrast Ct Angio Neck W Or Wo Contrast 04/19/2017 IMPRESSION:  1. No large vessel occlusion.  2. Minimal atherosclerotic changes at the carotid bifurcations bilaterally without significant stenosis.  3. Mild-to-moderate cavernous right ICA narrowing.  4. Distal small vessel disease without a significant proximal stenosis, aneurysm, or branch vessel occlusion within the circle-of-Willis.   Ct Head Wo Contrast 04/17/2017 IMPRESSION:  Chronic moderate small vessel ischemic disease of periventricular white matter. No  acute intracranial abnormality.   Mr Maxine GlennMra Head Wo Contrast 04/18/2017 IMPRESSION:  Moderate stenosis of the distal right internal carotid artery, predominantly the clinoid and ophthalmic segments. Otherwise normal intracranial MRA.   Mr Brain Wo  Contrast 04/18/2017 IMPRESSION:  1. 7 mm acute ischemic infarct involving the left periventricular white matter. Additional faint patchy small vessel infarct involving the deep white matter of the anterior right frontal lobe.  No associated hemorrhage.  2. Advanced cerebral atrophy with chronic small vessel ischemic disease for age, with multiple remote lacunar infarcts involving the cerebral hemispheric white matter, thalami, and brainstem.  3. Innumerable chronic micro hemorrhages throughout the brain, likely related to chronic ischemia and/or hypertension.   Transthoracic Echocardiogram  04/19/2017 Study Conclusions - Left ventricle: The cavity size was normal. Wall thickness was increased in a pattern of mild LVH. Systolic function was normal. The estimated ejection fraction was in the range of 60% to 65%. Wall motion was normal; there were no regional wall motion abnormalities. Left ventricular diastolic function parameters were normal. - Atrial septum: A patent foramen ovale cannot be excluded.   Hx of stroke/ICH  03/2017 -  acute left periventricular WM and subacute anterior right frontal punctate infarcts. CTA head and neck mild cavernous right ICA narrowing.  EF 60-65%.  LDL 130 and A1c 7.0.  Continue on aspirin.  01/2016 - left pontine, right insular small infarcts, EF 60-65%, LDL 99, aspirin 81 changed to 325, continue Lipitor 80  09/2015 -left pontine/midbrain infarct secondary to small vessel disease- genetic testing recommended but not able to do due to insurance issue -CTA head and neck left P2 high-grade stenosis, EF 65-70%, LDL 102, A1c 8.5 - on aspirin and pravastatin  05/2014 -right small thalamic ICH likely due to hypertensionand DM -LDL 140 and A1c 9.0 -MRI showed extensive CMBs and white matter changes.  MRA negative   Assessment: Gypsy BalsamMarva Delgrande is a 47 year old female with history of ICH on 05/2014 and ischemic strokes including lacunar infarcts,  extensive microbleeds, and significant white matter ischemic changes all likely related to small vessel disease however single gene mutation associated vasculopathy such as COL4A1, COL4A2, CADASIL AND CARASIL should be considered but unfortunately genetic testing has not been approved by insurance (Medicaid).  Vascular risk factors include HTN, HLD and DM.  Recommended OSA work-up at previous appointment.   Plan:  - continue ASA and lipitor for stroke prevention - get BP monitoring device and check BP at home - diabetic diet and regular exercise - check glucose at home and avoid low sugar - Follow up with your primary care physician for stroke risk factor modification. Recommend maintain blood pressure goal <130/80, diabetes with hemoglobin A1c goal below 6.5% and lipids with LDL cholesterol goal below 70 mg/dL.  - follow up in 3 months with Shanda BumpsJessica  I spent more than 25 minutes of face to face time with the patient. Greater than 50% of time was spent in counseling and coordination of care. We discussed PT and speech therapy, and sleep study.    George HughJessica Norlan Rann, AGNP-BC  [facility] [address] [place], [place] [id number]  Phone [phone] Fax [phone] Note: This document was prepared with digital dictation and possible smart phrase technology. Any transcriptional errors that result from this process are unintentional. .

## 2017-08-10 DIAGNOSIS — R5383 Other fatigue: Secondary | ICD-10-CM | POA: Insufficient documentation

## 2017-08-10 DIAGNOSIS — G4719 Other hypersomnia: Secondary | ICD-10-CM | POA: Insufficient documentation

## 2017-08-10 MED ORDER — ZALEPLON 5 MG PO CAPS: 5.0000 mg | ORAL_CAPSULE | Freq: Every evening | ORAL | 1 refills | Status: DC | PRN

## 2017-08-10 NOTE — Addendum Note (Signed)
Addended by: Melvyn NovasHMEIER, Serinity Ware on: 08/10/2017 05:21 PM   Modules accepted: Orders

## 2017-08-10 NOTE — Procedures (Signed)
PATIENT'S NAME:  Stephanie Lloyd, Stephanie Lloyd DOB:      02/06/1970      MR#:    [id number]     DATE OF RECORDING: 07/21/2017 REFERRING M.D.:  Henreitta LeberJessica Van Schaick, NP Study Performed:   Baseline Polysomnogram HISTORY:  Chief complaint according to patient: "see if my strokes relate to apnea" .   I have the pleasure of seeing Stephanie Lloyd, a 47 year old African-American right-handed female on 08 July 2017 for a sleep study evaluation. She [facility] in a stroke neurology follow-up visit with Dr. Roda ShuttersXu on 15th of April 2019.  She was last admitted with stroke symptoms on March 21st 2019 [facility]. She has a history of hypertension, diabetes mellitus and multiple strokes over the last 3 years.   She was first admitted on 22 Jun 2014 for acute onset of left-sided weakness, slurred speech and had presented with a critical elevation in blood pressure of 250/136 mmHg.  Her hemoglobin A1c was 9.0, she also had elevated creatinine.  Blood pressure has been controlled with medications, and diabetes has also been treated ever since. Re-admission on 10-08-2015 and again 28 February 2016 with yet another stroke event manifesting as innumerable micro-hemorrhages.   The patient endorsed the Epworth Sleepiness Scale at 12 points.  Fatigue SS at 63 - the highest possible endorsement . The patient's weight 205 pounds with a height of 70 (inches), resulting in a BMI of 29.4 kg/m2. The patient's neck circumference measured 13.5 inches.  CURRENT MEDICATIONS: Norvasc, Aspirin, Lipitor, Flonase, Trajenta, Lisinopril, Actos, Vesicare   PROCEDURE:  This is a multichannel digital polysomnogram utilizing the Somnostar 11.2 system.  Electrodes and sensors were applied and monitored per AASM Specifications.   EEG, EOG, Chin and Limb EMG, were sampled at 200 Hz.  ECG, Snore and Nasal Pressure, Thermal Airflow, Respiratory Effort, CPAP Flow and Pressure, Oximetry was sampled at 50 Hz.  Digital video and audio were recorded.      BASELINE STUDY: Lights Out was at 00:04 and Lights On at 06:46.  Total recording time (TRT) was 402.5 minutes, with a total sleep time (TST) of 210 minutes.   The patient's sleep latency was 80.5 minutes.  REM latency was 164 minutes.  The sleep efficiency was poor at 52.2 %.     SLEEP ARCHITECTURE: WASO (Wake after sleep onset) was 17 minutes.  There were 20 minutes in Stage N1, 85 minutes Stage N2, 84.5 minutes Stage N3 and 20.5 minutes in Stage REM.  The percentage of Stage N1 was 9.5%, Stage N2 was 40.5%, Stage N3 was 40.2% and Stage R (REM sleep) was 9.8%.  RESPIRATORY ANALYSIS:  There were a total of 28 respiratory events:  1 obstructive apnea, 0 central apneas and 2 mixed apneas with 25 hypopneas. The patient also had 0 respiratory event related arousals (RERAs).     The total APNEA/HYPOPNEA INDEX (AHI) was 8.0/hour and the total RESPIRATORY DISTURBANCE INDEX was 8.0 /hour.  12 events occurred in REM sleep and 30 events in NREM. The REM AHI was 35.1 /hour, versus a non-REM AHI of 5.1/h. The patient spent 130.5 minutes of total sleep time in the supine position and 80 minutes in non-supine. The supine AHI was 12.4 versus a non-supine AHI of 0.8.  OXYGEN SATURATION & C02:  The Wake baseline 02 saturation was 95%, with the lowest being 76%. Time spent below 89% saturation equaled 0.5 minutes. End Tidal CO2 during sleep was never greater than 50  torr.   PERIODIC LIMB MOVEMENTS:   The patient had a total of 0 Periodic Limb Movements.  The Periodic Limb Movement (PLM) index was 0 and the PLM Arousal index was 0/hour. The arousals were noted as: 22 were spontaneous, 0 were associated with PLMs, and 10 were associated with respiratory events.  Audio and video analysis did not show any abnormal or unusual movements, behaviors, phonations or vocalizations.   The patient took one bathroom break. Snoring was noted. EKG was in keeping with normal sinus rhythm  (NSR). Post-study, the patient indicated that sleep problems were the same as usual, it took her a long time to go to sleep.    IMPRESSION: Mild Obstructive Sleep Apnea(OSA) accentuated in REM sleep and in supine sleep.  1. Moderate Snoring but no hypoxemia/ hypercapnia and no irregular heart rate. 2. Prolonged sleep latency 3. Sleep EEG slowed due to multiple strokes, accounting for generalized slowing and larger proportion of N3 sleep.    RECOMMENDATIONS:  1. Advise full-night, attended, CPAP titration study to optimize therapy.  The patient has mild apnea but accentuated in REM sleep, which cannot be treated with a dental device.  2. Insomnia may need to be treated with a medication that does not increase her daytime fatigue. I would consider a short acting medication such as Sonata for 3-4 days a week.    I certify that I have reviewed the entire raw data recording prior to the issuance of this report in accordance with the Standards of Accreditation of the American Academy of Sleep Medicine (AASM)   Melvyn Novas, MD    08-08-2017  Diplomat, American Board of Psychiatry and Neurology  Diplomat, American Board of Sleep Medicine Fellow of the AAN and AASM. Medical Director of [employer] [facility] [employer]

## 2017-08-11 ENCOUNTER — Ambulatory Visit: Payer: Medicaid Other | Admitting: Adult Health

## 2017-08-11 ENCOUNTER — Telehealth: Payer: Self-pay | Admitting: Neurology

## 2017-08-11 NOTE — Telephone Encounter (Signed)
-----   Message from Melvyn Novasarmen Dohmeier, MD sent at 08/10/2017  5:21 PM EDT ----- Evidence of mild sleep apnea, accentutaed during REM sleep and in supine sleep position. The patient has a slowed EEG.  Return for CPAP titration.  Insomnia treatment with sonata for a limited time of 21 days should help to initiate sleep in the lab and while new on CPAP. .Marland Kitchen

## 2017-08-11 NOTE — Telephone Encounter (Signed)

## 2017-08-11 NOTE — Telephone Encounter (Signed)
Patient called back stating she didn't wanted to proceed forward with CPAP and was there other alternative to treating her apnea. I reviewed the importance of treating apnea after having a stoke and how it increases the risk of more strokes when leaving apnea untreated. I informed her that Dr Vickey Hugerohmeier didn't feel that dental device would provide adequate treatment based off of what her study showed. I encouraged to come in for cpap titration because this would give her the opportunity to try the cpap machine and see how she can tolerate it. Pt verbalized understanding. I went over how Dr Vickey Hugerohmeier would like to help with treating her insomnia 3-4 times a week with a medication and encouraged that she takes a dose on the night of her cpap titration test. Pt verbalized understanding. She states she will try and come in for the titration study.

## 2017-08-11 NOTE — Telephone Encounter (Signed)
Dr Vickey Hugerohmeier has wrote for a medication for the patient if she needs this when coming in for her CPAP titration test. I will make the sleep lab aware when scheduling so that they can ask if the pt would like to have this medication.

## 2017-08-12 ENCOUNTER — Encounter: Payer: Self-pay | Admitting: *Deleted

## 2017-08-12 ENCOUNTER — Encounter: Payer: Self-pay | Admitting: Adult Health

## 2017-08-13 ENCOUNTER — Ambulatory Visit: Payer: Medicaid Other | Admitting: Occupational Therapy

## 2017-08-14 ENCOUNTER — Ambulatory Visit: Payer: Medicaid Other | Admitting: Occupational Therapy

## 2017-08-14 DIAGNOSIS — R278 Other lack of coordination: Secondary | ICD-10-CM

## 2017-08-14 DIAGNOSIS — I69351 Hemiplegia and hemiparesis following cerebral infarction affecting right dominant side: Secondary | ICD-10-CM

## 2017-08-14 DIAGNOSIS — M6281 Muscle weakness (generalized): Secondary | ICD-10-CM

## 2017-08-14 NOTE — Therapy (Signed)
[facility] [address] [place], [place], [address] Phone: [phone]   Fax:  [phone]  Occupational Therapy Treatment  Patient Details  Name: Stephanie Lloyd MRN: [id number] Date of Birth: Jan 22, 1971 Referring Provider: Rosalin Hawking (neurologist)   Encounter Date: 08/14/2017  OT End of Session - 08/14/17 1159    Visit Number  7    Number of Visits  9    Date for OT Re-Evaluation  08/19/17    Authorization Type  MCD     Authorization Time Period  06/25/17 - 08/19/17    Authorization - Visit Number  6    Authorization - Number of Visits  8    OT Start Time  1100    OT Stop Time  1145    OT Time Calculation (min)  45 min    Activity Tolerance  Patient tolerated treatment well    Behavior During Therapy  [facility] for tasks assessed/performed       Past Medical History:  Diagnosis Date  . Diabetes ([place])   . Diabetes mellitus without complication ([place])   . Diastolic dysfunction   . Hypertension   . Immune deficiency disorder ([place])   . Menometrorrhagia 04/01/2016  . Stroke [facility])     Past Surgical History:  Procedure Laterality Date  . NO PAST SURGERIES      There were no vitals filed for this visit.  Subjective Assessment - 08/14/17 1106    Subjective   My shoulder is a little better.     Pertinent History  Recent CVA 04/17/17. PMH: 05/2014 (affecting Lt side), 09/2015 (affecting Rt side), 01/2016 (both sides, Rt worse), HTN, DM2, Polyneuropathy, CKD stage III    Limitations  fall risk, no driving    Patient Stated Goals  folding laundry, writing     Currently in Pain?  No/denies         [facility] OT Assessment - 08/14/17 0001      Coordination   Right 9 Hole Peg Test  Rt = 28.66 sec.     Left 9 Hole Peg Test  Lt = 31.44 sec      Hand Function   Right Hand Grip (lbs)  45 lb    Left Hand Grip (lbs)  57 lbs               OT Treatments/Exercises (OP) - 08/14/17 0001      Hand Exercises   Other Hand  Exercises  Gripper initially set at level 3 resistance to pick up blocks Lt hand, but had to reduce to level 2 fairly quickly d/t fatigue/drops. Pt then completed at level 2 without difficulty. Pt repeated activity with Rt hand at level 2 resistance with mod difficulty and drops, and 2 rest breaks needed Rt hand.       Neurological Re-education Exercises   Other Exercises 1  Supine: bilateral shoulder flexion x 10 reps with ball. Followed by stretch in shoulder abd and ER. Progressed to bilateral sh. flexion seated at lower level with cues to prevent compensations.     Other Exercises 2  Standing: RUE high level AA/ROM sh. flexion using UE Ranger       Fine Motor Coordination (Hand/Wrist)   Small Pegboard  Pt placing O'Connor pegs in pegboard Rt hand, then Lt hand with min difficulty. Pt had min to mod drops Lt hand                  OT Long Term  Goals - 08/14/17 1122      OT LONG TERM GOAL #1   Title  Independent with coordination HEP and putty HEP for bilateral hands - 08/19/17    Baseline  Issued on previous admission but has not been doing since last year, will need review/updates    Time  8    Period  Weeks    Status  Achieved      OT LONG TERM GOAL #2   Title  Improve coordination bilaterally as evidenced by reducing speed on 9 hole peg test by 5 sec. or more    Baseline  eval: Rt = 35.22 sec. sec, Lt = 41.53 sec    Time  8    Period  Weeks    Status  Achieved 08/14/17: RT = 28.66 sec, Lt = 31.44 sec      OT LONG TERM GOAL #3   Title  Grip strength to improve bilaterally by 5 lbs or greater to open jars/containers    Baseline  eval: RT = 46 lbs, Lt = 51 lbs    Time  8    Period  Weeks    Status  Partially Met 08/14/17: Rt = 45 lbs, Lt = 57 lbs (met on Lt only)      OT LONG TERM GOAL #4   Title  Pt to return to folding laundry with BUE's    Baseline  dependent    Time  8    Period  Weeks    Status  Achieved      OT LONG TERM GOAL #5   Title  Pt to demo increased  Rt shoulder flexion to 115* or greater for overhead reaching     Baseline  100*    Time  8    Period  Weeks    Status  On-going      OT LONG TERM GOAL #6   Title  Pt to verbalize understanding with A/E options for tying shoes, writing, and cooking to increase ease, independence, and safety with these tasks    Baseline  dependent     Time  8    Period  Weeks    Status  Achieved            Plan - 08/14/17 1159    Clinical Impression Statement  Pt met LTG #2 and partially met LTG #3. Pt has improved with bilateral hand coordination, and Lt grip strength. Pt also with observable higher sh. flexion    Occupational Profile and client history currently impacting functional performance  PMH: h/o multiple CVA'S (05/2014, 09/2015, 01/2016), HTN, DM2, CKD stage III    Occupational performance deficits (Please refer to evaluation for details):  ADL's;IADL's;Leisure    Rehab Potential  Good    OT Frequency  2x / week    OT Duration  8 weeks    OT Treatment/Interventions  Self-care/ADL training;Moist Heat;DME and/or AE instruction;Aquatic Therapy;Therapeutic activities;Therapeutic exercise;Cognitive remediation/compensation;Coping strategies training;Neuromuscular education;Functional Mobility Training;Passive range of motion;Manual Therapy;Patient/family education;Visual/perceptual remediation/compensation    Plan  assess remaining LTG, Functional RUE reaching, and d/c O.T. next session    Consulted and Agree with Plan of Care  Patient       Patient will benefit from skilled therapeutic intervention in order to improve the following deficits and impairments:  Decreased coordination, Decreased range of motion, Decreased coping skills, Decreased endurance, Decreased safety awareness, Decreased knowledge of precautions, Decreased activity tolerance, Impaired UE functional use, Decreased knowledge of use of DME, Decreased balance, Decreased  cognition, Decreased mobility, Decreased strength, Impaired  perceived functional ability, Impaired vision/preception  Visit Diagnosis: Muscle weakness (generalized)  Hemiplegia and hemiparesis following cerebral infarction affecting right dominant side ([place])  Other lack of coordination    Problem List Patient Active Problem List   Diagnosis Date Noted  . Excessive daytime sleepiness 08/10/2017  . Fatigue 08/10/2017  . 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 Snoring 07/08/2017  . Cognitive decline 07/08/2017  . History of intracranial hemorrhage 05/12/2017  . Facial droop 04/18/2017  . CKD (chronic kidney disease), stage III ([place]) 04/18/2017  . Anxiety 01/16/2017  . Leg cramps 01/16/2017  . Hypoglycemia due to insulin 08/29/2016  . Diverticulosis 08/29/2016  . Diverticulitis 08/29/2016  . 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 OSA (obstructive sleep apnea) 06/10/2016  . Iron deficiency anemia due to chronic blood loss 04/01/2016  . Menometrorrhagia 04/01/2016  . Upper respiratory infection 03/25/2016  . History of stroke   . History of intracerebral hemorrhage without residual deficit   . Cerebrovascular accident (CVA) due to thrombosis of basilar artery ([place]) 02/28/2016  . Ischemic stroke ([place]) 02/28/2016  . Acute kidney injury ([place]) 10/28/2015  . Acute encephalopathy 10/28/2015  . Diastolic dysfunction   . Stroke (cerebrum) ([place])   . Focal infarction of brain ([place]) 10/08/2015  . Syncope 08/19/2015  . Hypertensive emergency 07/29/2015  . Hypertensive urgency 07/28/2015  . Hypokalemia 07/28/2015  . Accelerated hypertension 09/21/2014  . Type 2 diabetes mellitus with other circulatory complications ([place]) 67/20/9470  . Hyperlipidemia 09/21/2014  . Thalamic hemorrhage with stroke ([place]) 07/29/2014  . Hyperlipidemia LDL goal <70 07/29/2014  . Hemorrhagic stroke ([place]) 06/22/2014  . ICH (intracerebral hemorrhage)  ([place]) 06/22/2014  . Essential hypertension 05/13/2012    Carey Bullocks, OTR/L 08/14/2017, 12:02 PM  [facility] [address] [place], [place], [address] Phone: [phone]   Fax:  [phone]  Name: Stephanie Lloyd MRN: [id number] Date of Birth: Feb 26, 1970

## 2017-08-15 LAB — HM DIABETES EYE EXAM

## 2017-08-17 ENCOUNTER — Ambulatory Visit (INDEPENDENT_AMBULATORY_CARE_PROVIDER_SITE_OTHER): Payer: Medicaid Other | Admitting: Neurology

## 2017-08-17 DIAGNOSIS — R4189 Other symptoms and signs involving cognitive functions and awareness: Secondary | ICD-10-CM

## 2017-08-17 DIAGNOSIS — G4733 Obstructive sleep apnea (adult) (pediatric): Secondary | ICD-10-CM

## 2017-08-17 DIAGNOSIS — I67850 Cerebral autosomal dominant arteriopathy with subcortical infarcts and leukoencephalopathy: Secondary | ICD-10-CM

## 2017-08-17 DIAGNOSIS — R0683 Snoring: Secondary | ICD-10-CM

## 2017-08-17 DIAGNOSIS — G4719 Other hypersomnia: Secondary | ICD-10-CM

## 2017-08-19 ENCOUNTER — Telehealth: Payer: Self-pay | Admitting: *Deleted

## 2017-08-19 ENCOUNTER — Ambulatory Visit: Payer: Medicaid Other | Admitting: Occupational Therapy

## 2017-08-19 DIAGNOSIS — R278 Other lack of coordination: Secondary | ICD-10-CM

## 2017-08-19 DIAGNOSIS — M6281 Muscle weakness (generalized): Secondary | ICD-10-CM | POA: Diagnosis not present

## 2017-08-19 DIAGNOSIS — I69351 Hemiplegia and hemiparesis following cerebral infarction affecting right dominant side: Secondary | ICD-10-CM

## 2017-08-19 NOTE — Telephone Encounter (Signed)
Office we referred to does not do genetic counseling.  They gave number to call.  Information given to patient for them to call .

## 2017-08-19 NOTE — Therapy (Signed)
[facility] [address] [place], [place], [address] Phone: [phone]   Fax:  [phone]  Occupational Therapy Treatment  Patient Details  Name: Stephanie Lloyd MRN: [id number] Date of Birth: October 04, 1970 Referring Provider: Rosalin Hawking (neurologist)   Encounter Date: 08/19/2017  OT End of Session - 08/19/17 0834    Visit Number  8    Number of Visits  9    Date for OT Re-Evaluation  08/19/17    Authorization Type  MCD     Authorization Time Period  06/25/17 - 08/19/17    Authorization - Visit Number  7    Authorization - Number of Visits  8    OT Start Time  0808    OT Stop Time  0846    OT Time Calculation (min)  38 min    Activity Tolerance  Patient tolerated treatment well    Behavior During Therapy  [facility] for tasks assessed/performed       Past Medical History:  Diagnosis Date  . Diabetes ([place])   . Diabetes mellitus without complication ([place])   . Diastolic dysfunction   . Hypertension   . Immune deficiency disorder ([place])   . Menometrorrhagia 04/01/2016  . Stroke [facility])     Past Surgical History:  Procedure Laterality Date  . NO PAST SURGERIES      There were no vitals filed for this visit.  Subjective Assessment - 08/19/17 0809    Pertinent History  Recent CVA 04/17/17. PMH: 05/2014 (affecting Lt side), 09/2015 (affecting Rt side), 01/2016 (both sides, Rt worse), HTN, DM2, Polyneuropathy, CKD stage III  ([place])     Limitations  fall risk, no driving  (Pended)     Patient Stated Goals  folding laundry, writing   ([place])     Currently in Pain?  No/denies  (Pended)                    OT Treatments/Exercises (OP) - 08/19/17 0001      ADLs   Writing  Pt practiced writing full paragraph with built up pen with approx. 90% legibility, mild problems w/ spacing b/t words      Shoulder Exercises: ROM/Strengthening   UBE (Upper Arm Bike)  UBE x 5 min. Level 3 for strength and reciprocal movement  pattern    Other ROM/Strengthening Exercises  Supine: pt performed bilateral sh. flexion, sh. abduction to Rt side, and bilateral ER/IR each x 10 reps.     Other ROM/Strengthening Exercises  Seated: bilateral sh flexion, sh. abduction to Rt side, and towel stretches x 10 reps each      Functional Reaching Activities   Mid Level  Mid to high level reaching RUE to retrieve and replace cones b/t 105* - 110*                   OT Long Term Goals - 08/19/17 2641      OT LONG TERM GOAL #1   Title  Independent with coordination HEP and putty HEP for bilateral hands - 08/19/17    Baseline  Issued on previous admission but has not been doing since last year, will need review/updates    Time  8    Period  Weeks    Status  Achieved      OT LONG TERM GOAL #2   Title  Improve coordination bilaterally as evidenced by reducing speed on 9 hole peg test by 5 sec. or more  Baseline  eval: Rt = 35.22 sec. sec, Lt = 41.53 sec    Time  8    Period  Weeks    Status  Achieved 08/14/17: RT = 28.66 sec, Lt = 31.44 sec      OT LONG TERM GOAL #3   Title  Grip strength to improve bilaterally by 5 lbs or greater to open jars/containers    Baseline  eval: RT = 46 lbs, Lt = 51 lbs    Time  8    Period  Weeks    Status  Partially Met 08/14/17: Rt = 45 lbs, Lt = 57 lbs (met on Lt only)      OT LONG TERM GOAL #4   Title  Pt to return to folding laundry with BUE's    Baseline  dependent    Time  8    Period  Weeks    Status  Achieved      OT LONG TERM GOAL #5   Title  Pt to demo increased Rt shoulder flexion to 115* or greater for overhead reaching     Baseline  100*    Time  8    Period  Weeks    Status  Not Met between 105-110* but with greater ease and less compensations       OT LONG TERM GOAL #6   Title  Pt to verbalize understanding with A/E options for tying shoes, writing, and cooking to increase ease, independence, and safety with these tasks    Baseline  dependent     Time  8     Period  Weeks    Status  Achieved            Plan - 08/19/17 3354    Clinical Impression Statement  Pt has met most LTG's with improvement in bilateral hand coordination, Rt shoulder ROM, and Lt grip strength.     Occupational Profile and client history currently impacting functional performance  PMH: h/o multiple CVA'S (05/2014, 09/2015, 01/2016), HTN, DM2, CKD stage III    Rehab Potential  Good    OT Treatment/Interventions  Self-care/ADL training;Moist Heat;DME and/or AE instruction;Aquatic Therapy;Therapeutic activities;Therapeutic exercise;Cognitive remediation/compensation;Coping strategies training;Neuromuscular education;Functional Mobility Training;Passive range of motion;Manual Therapy;Patient/family education;Visual/perceptual remediation/compensation    Plan  D/C O.T.     Consulted and Agree with Plan of Care  Patient       Patient will benefit from skilled therapeutic intervention in order to improve the following deficits and impairments:  Decreased coordination, Decreased range of motion, Decreased coping skills, Decreased endurance, Decreased safety awareness, Decreased knowledge of precautions, Decreased activity tolerance, Impaired UE functional use, Decreased knowledge of use of DME, Decreased balance, Decreased cognition, Decreased mobility, Decreased strength, Impaired perceived functional ability, Impaired vision/preception  Visit Diagnosis: Hemiplegia and hemiparesis following cerebral infarction affecting right dominant side ([facility])  Other lack of coordination  Muscle weakness (generalized)    Problem List Patient Active Problem List   Diagnosis Date Noted  . Excessive daytime sleepiness 08/10/2017  . Fatigue 08/10/2017  . 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 Snoring 07/08/2017  . Cognitive decline 07/08/2017  . History of intracranial hemorrhage 05/12/2017  . Facial droop  04/18/2017  . CKD (chronic kidney disease), stage III ([place]) 04/18/2017  . Anxiety 01/16/2017  . Leg cramps 01/16/2017  . Hypoglycemia due to insulin 08/29/2016  . Diverticulosis 08/29/2016  . Diverticulitis 08/29/2016  . 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 OSA (obstructive sleep apnea) 06/10/2016  . Iron deficiency anemia due to chronic blood loss 04/01/2016  . Menometrorrhagia 04/01/2016  . Upper respiratory infection 03/25/2016  . History of stroke   . History of intracerebral hemorrhage without residual deficit   . Cerebrovascular accident (CVA) due to thrombosis of basilar artery ([place]) 02/28/2016  . Ischemic stroke ([place]) 02/28/2016  . Acute kidney injury ([place]) 10/28/2015  . Acute encephalopathy 10/28/2015  . Diastolic dysfunction   . Stroke (cerebrum) ([place])   . Focal infarction of brain ([place]) 10/08/2015  . Syncope 08/19/2015  . Hypertensive emergency 07/29/2015  . Hypertensive urgency 07/28/2015  . Hypokalemia 07/28/2015  . Accelerated hypertension 09/21/2014  . Type 2 diabetes mellitus with other circulatory complications ([place]) 78/71/8367  . Hyperlipidemia 09/21/2014  . Thalamic hemorrhage with stroke ([place]) 07/29/2014  . Hyperlipidemia LDL goal <70 07/29/2014  . Hemorrhagic stroke ([place]) 06/22/2014  . ICH (intracerebral hemorrhage) ([place]) 06/22/2014  . Essential hypertension 05/13/2012    OCCUPATIONAL THERAPY DISCHARGE SUMMARY  Visits from Start of Care: 7  Current functional level related to goals / functional outcomes: (See above for details)   Remaining deficits: RUE ROM  RUE grip strength Cognition   Education / Equipment: HEP's, safety recommendations, A/E recommendations  Plan: Patient agrees to discharge.  Patient goals were partially met. Patient is being discharged due to meeting the stated rehab goals.  And Medicaid date ends today?????         Carey Bullocks, OTR/L 08/19/2017, 8:42  AM  [facility] [address] [place], [place], [address] Phone: [phone]   Fax:  [phone]  Name: LAQUENTA WHITSELL MRN: [id number] Date of Birth: 08-15-1970

## 2017-08-22 NOTE — Procedures (Signed)
PATIENT'S NAME:  Stephanie Lloyd, Stephanie Lloyd DOB:      July 27, 1970      MR#:    [id number]     DATE OF RECORDING: 08/17/2017 REFERRING M.D.:  Dr. Roda Shutters, George Hugh, NP Study Performed:   Titration to positive airway pressure HISTORY:  Mrs. Shambaugh is returning for a CPAP Titration sleep study following her Baseline PSG from 07/21/17 which revealed an AHI of 8/h, REM AHI of 35.1/h, and total time in desaturation of 0.5 minutes, with an oxygen nadir of 76%. She had slept poorly during the PSG, sleep efficiency was only 51%  The patient endorsed the Epworth Sleepiness Scale at 12/24 points.   The patient's weight 204 pounds with a height of 70 (inches), resulting in a BMI of 29.4 kg/m2. The patient's neck circumference measured 13.5 inches.  CURRENT MEDICATIONS: Norvasc, Aspirin, Lipitor, Flonase, Trajenta, Lisinopril, Actos, Vesicare    PROCEDURE:  This is a multichannel digital polysomnogram utilizing the SomnoStar 11.2 system.  Electrodes and sensors were applied and monitored per AASM Specifications.   EEG, EOG, Chin and Limb EMG, were sampled at 200 Hz.  ECG, Snore and Nasal Pressure, Thermal Airflow, Respiratory Effort, CPAP Flow and Pressure, Oximetry was sampled at 50 Hz. Digital video and audio were recorded.      CPAP was initiated at 5 cmH20 with heated humidity per AASM split night standards and pressure was advanced to 5 cmH20 because of hypopneas, apneas and desaturations.  At a PAP pressure of 5 cmH20, there was a reduction of the AHI to 1.7 with improvement of sleep apnea. The patient was fitted with a ResMed Airfit P10 - apparatus.  Lights Out was at 22:30 and Lights On at 05:17. Total recording time (TRT) was 407 minutes, with a total sleep time (TST) of 69 minutes.  The patient's sleep latency was 79 minutes. REM latency was 0 minutes.  The sleep efficiency was 17.0 %.    SLEEP ARCHITECTURE: WASO (Wake after sleep onset) was 268.5 minutes.  There were 2.5 minutes in Stage N1, 46.5 minutes  Stage N2, 20 minutes Stage N3 and 0 minutes in Stage REM.    RESPIRATORY ANALYSIS:  There was a total of 2 respiratory events: 2 obstructive apneas, 0 central apneas and 0 hypopneas with 0 respiratory event related arousals (RERAs).     The total APNEA/HYPOPNEA INDEX (AHI) was 1.7 /hour and the total RESPIRATORY DISTURBANCE INDEX was 1.7 /hour.  0 events occurred in REM sleep and 2 events in NREM. The REM AHI was 0.0 /hour versus a non-REM AHI of 1.7 /hour.  The patient spent 23.5 minutes of total sleep time in the supine position and 46 minutes in non-supine. The supine AHI was 0.0, versus a non-supine AHI of 2.6.  OXYGEN SATURATION & C02:  The baseline 02 saturation was 96%, with the lowest being 45%. Time spent below 89% saturation equaled 2 minutes. PERIODIC LIMB MOVEMENTS:  The patient had a total of 0 Periodic Limb Movements. The arousals were noted as: 2 were spontaneous, 0 were associated with PLMs, and 0 were associated with respiratory events.      DIAGNOSIS: Severe Insomnia - Mrs. Warmsley cannot tolerate CPAP. I will not pursue intervention.  PLANS/RECOMMENDATIONS:  Given the mild degree of apnea and lack of hypoxemia, I would opt for conservative treatment: avoid sleeping on the back, keep body weight under 28 BMI, and consider a dental device if snoring is bothersome to patient or spouse. PCP to address nocturia and basic sleep hygiene.  1. Follow-up with referring physician. 2. Educate patient in sleep hygiene measures.   3. Maintain lean body weight.  DISCUSSION:  A follow up appointment will not be needed. Follow up with George HughJessica VanSchaick, NP - [facility].   Please call [phone] with any questions.     I certify that I have reviewed the entire raw data recording prior to the issuance of this report in accordance with the Standards of Accreditation of the American Academy of Sleep Medicine (AASM)      Melvyn Novasarmen Raynie Steinhaus, M.D.    08-22-2017 Diplomat, American Board of Psychiatry and Neurology  Diplomat, American Board of Sleep Medicine Medical Director, [facility]

## 2017-08-25 ENCOUNTER — Telehealth: Payer: Self-pay | Admitting: Neurology

## 2017-08-25 NOTE — Telephone Encounter (Signed)
Called the patient to review sleep study results. Her husband answered. I was able to LVM with the patients husband for the pt to call back.

## 2017-08-25 NOTE — Telephone Encounter (Signed)
-----   Message from Melvyn Novasarmen Dohmeier, MD sent at 08/22/2017  3:02 PM EDT ----- DIAGNOSIS: Severe Insomnia - Mrs. Warmsley cannot tolerate CPAP.  I will not pursue intervention.  PLANS/RECOMMENDATIONS: Given the mild degree of apnea and lack  of hypoxemia, I would opt for conservative treatment: avoid  sleeping on the back, keep body weight under 28 BMI, and consider  a dental device if snoring is bothersome to patient or spouse.  PCP to address nocturia and basic sleep hygiene.  Consider Fatigue work up.  1. Follow-up with referring physician. 2. Educate patient in sleep hygiene measures.  3. Maintain lean body weight.  DISCUSSION: Follow  up with PCP and George HughJessica VanSchaick, NP - [facility].

## 2017-08-26 NOTE — Telephone Encounter (Signed)
Called the patient back and reviewed the sleep study with her. Informed her that since she is unable to tolerate CPAP she would need to treat her apnea conservatively. I encouraged and went over good sleep hygiene, encouraged weight loss and to keep BMI <28. Informed her that if she felt she could tolerate a dental device we could place a referral for a dental device since that could help with treating snoring and also mild apnea. At this time pt declined. Informed her there would be no need for follow up with sleep clinic but encouraged the pt to call back if she had questions. Pt verbalized understanding.

## 2017-08-26 NOTE — Telephone Encounter (Signed)
Pt has returned the call to RN Casey, she is asking for a call back °

## 2017-09-04 ENCOUNTER — Encounter: Payer: Self-pay | Admitting: Gastroenterology

## 2017-09-23 ENCOUNTER — Other Ambulatory Visit: Payer: Self-pay | Admitting: Family Medicine

## 2017-09-25 ENCOUNTER — Telehealth: Payer: Self-pay | Admitting: Family Medicine

## 2017-09-25 ENCOUNTER — Ambulatory Visit: Payer: Self-pay | Admitting: *Deleted

## 2017-09-25 DIAGNOSIS — I6930 Unspecified sequelae of cerebral infarction: Secondary | ICD-10-CM

## 2017-09-25 NOTE — Telephone Encounter (Signed)
This NT was converted to a telephone encounter. See today's TE. Answer Assessment - Initial Assessment Questions 1. SYMPTOM: "What is the main symptom you are concerned about?" (e.g., weakness, numbness)     Balance is off-she stumbles most of the time.  2. ONSET: "When did this start?" (minutes, hours, days; while sleeping)     A few months 3. LAST NORMAL: "When was the last time you were normal (no symptoms)?"     A few months ago 4. PATTERN "Does this come and go, or has it been constant since it started?"  "Is it present now?" yes     daily 5. CARDIAC SYMPTOMS: "Have you had any of the following symptoms: chest pain, difficulty breathing, palpitations?"     *No Answer* 6. NEUROLOGIC SYMPTOMS: "Have you had any of the following symptoms: headache, dizziness, vision loss, double vision, changes in speech, unsteady on your feet?"     Blurry vision, worsening speech, stumbling more than her usual. 7. OTHER SYMPTOMS: "Do you have any other symptoms?"     Mouth droops 8. PREGNANCY: "Is there any chance you are pregnant?" "When was your last menstrual period?"     no  Protocols used: NEUROLOGIC DEFICIT-A-AH

## 2017-09-25 NOTE — Telephone Encounter (Signed)
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 They were given an appointment 2 years out, 07/02/19. Will the PCP reach out to the genetics at [facility] [phone] for a sooner appointment. 2) Stated her OT ended on 08/19/17. 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 They are reporting her coordination and muscle weakness is worsening. 3) I see the July communication regarding the disability form. She has been denied and is currently in appeal She is requesting a letter from PCP stating her current condition along with  her near future projected ability to work. Routing to PCP for consideration of request.

## 2017-09-26 NOTE — Telephone Encounter (Signed)
We are the ones that put referral in for genetics-- gwen-- is there anything that can be done ? Ok to put PT referral in

## 2017-09-26 NOTE — Telephone Encounter (Signed)
That disablility form said she was unable to work-- -I'm not sure what else she needs

## 2017-09-30 ENCOUNTER — Encounter: Payer: Self-pay | Admitting: Endocrinology

## 2017-09-30 ENCOUNTER — Ambulatory Visit (INDEPENDENT_AMBULATORY_CARE_PROVIDER_SITE_OTHER): Payer: Medicaid Other | Admitting: Endocrinology

## 2017-09-30 VITALS — BP 118/84 | HR 65 | Ht 70.0 in | Wt 209.2 lb

## 2017-09-30 DIAGNOSIS — R609 Edema, unspecified: Secondary | ICD-10-CM | POA: Diagnosis not present

## 2017-09-30 DIAGNOSIS — E1159 Type 2 diabetes mellitus with other circulatory complications: Secondary | ICD-10-CM

## 2017-09-30 LAB — POCT GLYCOSYLATED HEMOGLOBIN (HGB A1C): HEMOGLOBIN A1C: 6.9 % — AB (ref 4.0–5.6)

## 2017-09-30 MED ORDER — LINAGLIPTIN 5 MG PO TABS: 5.0000 mg | ORAL_TABLET | Freq: Every day | ORAL | 3 refills | Status: DC

## 2017-09-30 MED ORDER — GLUCOSE BLOOD VI STRP: 1.0000 | ORAL_STRIP | Freq: Every day | 3 refills | Status: DC

## 2017-09-30 MED ORDER — PIOGLITAZONE HCL 15 MG PO TABS: 15.0000 mg | ORAL_TABLET | Freq: Every day | ORAL | 3 refills | Status: AC

## 2017-09-30 NOTE — Patient Instructions (Addendum)
check your blood sugar twice a day.  vary the time of day when you check, between before the 3 meals, and at bedtime.  also check if you have symptoms of your blood sugar being too high or too low.  please keep a record of the readings and bring it to your next appointment here (or you can bring the meter itself).  You can write it on any piece of paper.  please call us sooner if your blood sugar goes below 70, or if you have a lot of readings over 200. I have sent a prescription to your pharmacy, to reduce the pioglitazone, and: Please continue the same tradjenta. Please come back for a follow-up appointment in 4 months.

## 2017-09-30 NOTE — Progress Notes (Signed)
Subjective:    Patient ID: Stephanie Lloyd, female    DOB: [birth year], 47 y.o.   MRN: [id number]  HPI Pt returns for f/u of diabetes mellitus: DM type: 2 Dx'ed: 1998 Complications: polyneuropathy, CVA's, and renal insuff.   Therapy: 2 oral meds GDM: never DKA: never Severe hypoglycemia: last episode was mid-2018 Pancreatitis: never Pancreatic imaging: normal on 2018 CT.  Other: plan is to d/c insulin [phone]; memory loss limits complexity of regimen. Interval history: Pt says she takes meds as rx'ed.  She says cbg's are well-controlled.  pt states she feels well in general.   Past Medical History:  Diagnosis Date  . Diabetes (HCC)   . Diabetes mellitus without complication (HCC)   . Diastolic dysfunction   . Hypertension   . Immune deficiency disorder (HCC)   . Menometrorrhagia 04/01/2016  . Stroke [facility])     Past Surgical History:  Procedure Laterality Date  . NO PAST SURGERIES      Social History   Socioeconomic History  . Marital status: Married    Spouse name: Not on file  . Number of children: Not on file  . Years of education: Not on file  . Highest education level: Not on file  Occupational History  . Not on file  Social Needs  . Financial resource strain: Not on file  . Food insecurity:    Worry: Not on file    Inability: Not on file  . Transportation needs:    Medical: Not on file    Non-medical: Not on file  Tobacco Use  . Smoking status: Never Smoker  . Smokeless tobacco: Never Used  Substance and Sexual Activity  . Alcohol use: No    Alcohol/week: 0.0 standard drinks    Comment: occ  . Drug use: No  . Sexual activity: Yes    Birth control/protection: None  Lifestyle  . Physical activity:    Days per week: Not on file    Minutes per session: Not on file  . Stress: Not on file  Relationships  . Social connections:    Talks on phone: Not on file    Gets together: Not on file    Attends religious service: Not on file    Active member of  club or organization: Not on file    Attends meetings of clubs or organizations: Not on file    Relationship status: Not on file  . Intimate partner violence:    Fear of current or ex partner: Not on file    Emotionally abused: Not on file    Physically abused: Not on file    Forced sexual activity: Not on file  Other Topics Concern  . Not on file  Social History Narrative   ** Merged History Encounter **        Current Outpatient Medications on File Prior to Visit  Medication Sig Dispense Refill  . ACCU-CHEK SOFTCLIX LANCETS lancets Use as directed twice a day.  Dx Code: E11.9 100 each 1  . amLODipine (NORVASC) 10 MG tablet TAKE 1 TABLET BY MOUTH ONCE DAILY 30 tablet 11  . aspirin EC 325 MG EC tablet Take 1 tablet (325 mg total) by mouth daily. 30 tablet 11  . atorvastatin (LIPITOR) 80 MG tablet TAKE 1 TABLET BY MOUTH ONCE DAILY AT 6 PM 90 tablet 0  . fluticasone (FLONASE) 50 MCG/ACT nasal spray Place 2 sprays into both nostrils daily. 16 g 1  . lisinopril (PRINIVIL,ZESTRIL) 40 MG tablet Take 1  tablet (40 mg total) by mouth daily. 90 tablet 1  . prednisoLONE acetate (PRED FORTE) 1 % ophthalmic suspension Place 1 drop into both eyes 2 (two) times daily.     . solifenacin (VESICARE) 10 MG tablet Take 1 tablet (10 mg total) by mouth daily. 90 tablet 3   No current facility-administered medications on file prior to visit.     Allergies  Allergen Reactions  . Metformin And Related Diarrhea    Family History  Problem Relation Age of Onset  . Diabetes Father   . Hypertension Father   . Stroke Father   . Diabetes Paternal Uncle   . Stroke Paternal Uncle   . Stroke Brother   . Cancer Neg Hx   . Breast cancer Neg Hx   . Ovarian cancer Neg Hx   . Colon cancer Neg Hx   . Esophageal cancer Neg Hx     BP 118/84 (BP Location: Right Arm)   Pulse 65   Ht 5\' 10"  (1.778 m)   Wt 209 lb 3.2 oz (94.9 kg)   SpO2 96%   BMI 30.02 kg/m    Review of Systems She denies hypoglycemia     Objective:   Physical Exam VITAL SIGNS:  See vs page GENERAL: no distress Pulses: foot pulses are intact bilaterally.   MSK: no deformity of the feet or ankles.  CV: trace bilat edema of the legs or ankles Skin:  no ulcer on the feet or ankles.  normal color and temp on the feet and ankles Neuro: sensation is intact to touch on the feet and ankles.    A1c=5.6%     Assessment & Plan:  Type 2 DM: well-controlled Edema: new, prob due to pioglitazone  Patient Instructions  check your blood sugar twice a day.  vary the time of day when you check, between before the 3 meals, and at bedtime.  also check if you have symptoms of your blood sugar being too high or too low.  please keep a record of the readings and bring it to your next appointment here (or you can bring the meter itself).  You can write it on any piece of paper.  please call us sooner if your blood sugar goes below 70, or if you have a lot of readings over 200. I have sent a prescription to your pharmacy, to reduce the pioglitazone, and: Please continue the same tradjenta. Please come back for a follow-up appointment in 4 months.

## 2017-09-30 NOTE — Telephone Encounter (Signed)
Has the PT referral been placed yet?

## 2017-10-01 NOTE — Telephone Encounter (Signed)
Referral placed for PT

## 2017-10-01 NOTE — Addendum Note (Signed)
Addended by: Thelma Barge D on: 10/01/2017 10:19 AM   Modules accepted: Orders

## 2017-10-07 NOTE — Telephone Encounter (Signed)
Are you ok with writing the disability letter?

## 2017-10-07 NOTE — Telephone Encounter (Signed)
yes

## 2017-10-31 ENCOUNTER — Encounter: Payer: Self-pay | Admitting: *Deleted

## 2017-11-03 ENCOUNTER — Encounter: Payer: Self-pay | Admitting: *Deleted

## 2017-11-03 NOTE — Telephone Encounter (Signed)
Left message on vm that letter is ready for pickup.

## 2017-11-04 ENCOUNTER — Ambulatory Visit: Payer: Self-pay | Admitting: Gastroenterology

## 2017-11-30 ENCOUNTER — Other Ambulatory Visit: Payer: Self-pay | Admitting: Family Medicine

## 2017-11-30 DIAGNOSIS — I10 Essential (primary) hypertension: Secondary | ICD-10-CM

## 2018-01-09 ENCOUNTER — Other Ambulatory Visit: Payer: Self-pay | Admitting: Family Medicine

## 2018-02-03 ENCOUNTER — Encounter: Payer: Self-pay | Admitting: Endocrinology

## 2018-02-03 ENCOUNTER — Ambulatory Visit (INDEPENDENT_AMBULATORY_CARE_PROVIDER_SITE_OTHER): Payer: Medicaid Other | Admitting: Endocrinology

## 2018-02-03 VITALS — BP 132/82 | HR 66 | Ht 70.0 in | Wt 202.2 lb

## 2018-02-03 DIAGNOSIS — E1159 Type 2 diabetes mellitus with other circulatory complications: Secondary | ICD-10-CM

## 2018-02-03 LAB — POCT GLYCOSYLATED HEMOGLOBIN (HGB A1C): HEMOGLOBIN A1C: 6.5 % — AB (ref 4.0–5.6)

## 2018-02-03 NOTE — Patient Instructions (Signed)
check your blood sugar twice a day.  vary the time of day when you check, between before the 3 meals, and at bedtime.  also check if you have symptoms of your blood sugar being too high or too low.  please keep a record of the readings and bring it to your next appointment here (or you can bring the meter itself).  You can write it on any piece of paper.  please call us sooner if your blood sugar goes below 70, or if you have a lot of readings over 200.   °Please continue the same medications.   °Please come back for a follow-up appointment in 6 months.   °

## 2018-02-03 NOTE — Progress Notes (Signed)
Subjective:    Patient ID: Stephanie Lloyd, female    DOB: 08/07/1970, 48 y.o.   MRN: [id number]  HPI Pt returns for f/u of diabetes mellitus: DM type: 2 Dx'ed: 1998 Complications: polyneuropathy, CVA's, and renal insuff.   Therapy: 2 oral meds.  GDM: never DKA: never Severe hypoglycemia: last episode was mid-2018.   Pancreatitis: never.   Pancreatic imaging: normal on 2018 CT.  Other: she took insulin [phone]; memory loss limits complexity of regimen.   Interval history: Pt says she takes meds as rx'ed.  She says cbg's are well-controlled.  pt states she feels well in general.   Past Medical History:  Diagnosis Date  . Diabetes (HCC)   . Diabetes mellitus without complication (HCC)   . Diastolic dysfunction   . Hypertension   . Immune deficiency disorder (HCC)   . Menometrorrhagia 04/01/2016  . Stroke [facility](HCC)     Past Surgical History:  Procedure Laterality Date  . NO PAST SURGERIES      Social History   Socioeconomic History  . Marital status: Married    Spouse name: Not on file  . Number of children: Not on file  . Years of education: Not on file  . Highest education level: Not on file  Occupational History  . Not on file  Social Needs  . Financial resource strain: Not on file  . Food insecurity:    Worry: Not on file    Inability: Not on file  . Transportation needs:    Medical: Not on file    Non-medical: Not on file  Tobacco Use  . Smoking status: Never Smoker  . Smokeless tobacco: Never Used  Substance and Sexual Activity  . Alcohol use: No    Alcohol/week: 0.0 standard drinks    Comment: occ  . Drug use: No  . Sexual activity: Yes    Birth control/protection: None  Lifestyle  . Physical activity:    Days per week: Not on file    Minutes per session: Not on file  . Stress: Not on file  Relationships  . Social connections:    Talks on phone: Not on file    Gets together: Not on file    Attends religious service: Not on file    Active member  of club or organization: Not on file    Attends meetings of clubs or organizations: Not on file    Relationship status: Not on file  . Intimate partner violence:    Fear of current or ex partner: Not on file    Emotionally abused: Not on file    Physically abused: Not on file    Forced sexual activity: Not on file  Other Topics Concern  . Not on file  Social History Narrative   ** Merged History Encounter **        Current Outpatient Medications on File Prior to Visit  Medication Sig Dispense Refill  . ACCU-CHEK SOFTCLIX LANCETS lancets Use as directed twice a day.  Dx Code: E11.9 100 each 1  . amLODipine (NORVASC) 10 MG tablet TAKE 1 TABLET BY MOUTH ONCE DAILY 30 tablet 11  . aspirin EC 325 MG EC tablet Take 1 tablet (325 mg total) by mouth daily. 30 tablet 11  . atorvastatin (LIPITOR) 80 MG tablet TAKE 1 TABLET BY MOUTH ONCE DAILY AT 6 PM 90 tablet 1  . fluticasone (FLONASE) 50 MCG/ACT nasal spray Place 2 sprays into both nostrils daily. 16 g 1  . glucose blood (  ACCU-CHEK AVIVA PLUS) test strip 1 each by Other route daily. And lancets 1/day 100 each 3  . linagliptin (TRADJENTA) 5 MG TABS tablet Take 1 tablet (5 mg total) by mouth daily. 90 tablet 3  . lisinopril (PRINIVIL,ZESTRIL) 40 MG tablet TAKE 1 TABLET BY MOUTH ONCE DAILY 90 tablet 1  . pioglitazone (ACTOS) 15 MG tablet Take 1 tablet (15 mg total) by mouth daily. 90 tablet 3  . prednisoLONE acetate (PRED FORTE) 1 % ophthalmic suspension Place 1 drop into both eyes 2 (two) times daily.     . solifenacin (VESICARE) 10 MG tablet Take 1 tablet (10 mg total) by mouth daily. 90 tablet 3   No current facility-administered medications on file prior to visit.     Allergies  Allergen Reactions  . Metformin And Related Diarrhea    Family History  Problem Relation Age of Onset  . Diabetes Father   . Hypertension Father   . Stroke Father   . Diabetes Paternal Uncle   . Stroke Paternal Uncle   . Stroke Brother   . Cancer Neg Hx    . Breast cancer Neg Hx   . Ovarian cancer Neg Hx   . Colon cancer Neg Hx   . Esophageal cancer Neg Hx     BP 132/82 (BP Location: Right Arm, Patient Position: Sitting, Cuff Size: Normal)   Pulse 66   Ht 5\' 10"  (1.778 m)   Wt 202 lb 3.2 oz (91.7 kg)   SpO2 96%   BMI 29.01 kg/m    Review of Systems She denies hypoglycemia.     Objective:   Physical Exam VITAL SIGNS:  See vs page GENERAL: no distress Pulses: dorsalis pedis intact bilat.   MSK: no deformity of the feet CV: no leg edema Skin:  no ulcer on the feet.  normal color and temp on the feet. Neuro: sensation is intact to touch on the feet.    Lab Results  Component Value Date   CREATININE 1.22 (H) 08/05/2017   BUN 23 08/05/2017   NA 138 08/05/2017   K 4.4 08/05/2017   CL 101 08/05/2017   CO2 31 08/05/2017    Lab Results  Component Value Date   HGBA1C 6.5 (A) 02/03/2018       Assessment & Plan:  Type 2 DM: well-controlled.  Renal insuff: this limits rx options.   Patient Instructions  check your blood sugar twice a day.  vary the time of day when you check, between before the 3 meals, and at bedtime.  also check if you have symptoms of your blood sugar being too high or too low.  please keep a record of the readings and bring it to your next appointment here (or you can bring the meter itself).  You can write it on any piece of paper.  please call us sooner if your blood sugar goes below 70, or if you have a lot of readings over 200. Please continue the same medications. Please come back for a follow-up appointment in 6 months.

## 2018-02-06 ENCOUNTER — Encounter: Payer: Self-pay | Admitting: Family Medicine

## 2018-02-06 LAB — HM DIABETES EYE EXAM

## 2018-02-10 ENCOUNTER — Ambulatory Visit (INDEPENDENT_AMBULATORY_CARE_PROVIDER_SITE_OTHER): Payer: Medicaid Other | Admitting: Family Medicine

## 2018-02-10 ENCOUNTER — Encounter: Payer: Self-pay | Admitting: Family Medicine

## 2018-02-10 ENCOUNTER — Other Ambulatory Visit: Payer: Self-pay | Admitting: Family Medicine

## 2018-02-10 VITALS — BP 118/80 | HR 71 | Temp 97.6°F | Resp 16 | Ht 70.0 in | Wt 201.0 lb

## 2018-02-10 DIAGNOSIS — I10 Essential (primary) hypertension: Secondary | ICD-10-CM | POA: Diagnosis not present

## 2018-02-10 DIAGNOSIS — E785 Hyperlipidemia, unspecified: Secondary | ICD-10-CM | POA: Diagnosis not present

## 2018-02-10 DIAGNOSIS — Z0000 Encounter for general adult medical examination without abnormal findings: Secondary | ICD-10-CM | POA: Diagnosis not present

## 2018-02-10 DIAGNOSIS — N6459 Other signs and symptoms in breast: Secondary | ICD-10-CM

## 2018-02-10 DIAGNOSIS — E1159 Type 2 diabetes mellitus with other circulatory complications: Secondary | ICD-10-CM

## 2018-02-10 LAB — CBC WITH DIFFERENTIAL/PLATELET
BASOS PCT: 0.9 % (ref 0.0–3.0)
Basophils Absolute: 0.1 10*3/uL (ref 0.0–0.1)
EOS PCT: 2.7 % (ref 0.0–5.0)
Eosinophils Absolute: 0.2 10*3/uL (ref 0.0–0.7)
HEMATOCRIT: 40.8 % (ref 36.0–46.0)
HEMOGLOBIN: 13.3 g/dL (ref 12.0–15.0)
LYMPHS PCT: 19.4 % (ref 12.0–46.0)
Lymphs Abs: 1.5 10*3/uL (ref 0.7–4.0)
MCHC: 32.6 g/dL (ref 30.0–36.0)
MCV: 84.2 fl (ref 78.0–100.0)
MONO ABS: 0.5 10*3/uL (ref 0.1–1.0)
Monocytes Relative: 6 % (ref 3.0–12.0)
Neutro Abs: 5.5 10*3/uL (ref 1.4–7.7)
Neutrophils Relative %: 71 % (ref 43.0–77.0)
Platelets: 236 10*3/uL (ref 150.0–400.0)
RBC: 4.84 Mil/uL (ref 3.87–5.11)
RDW: 13.5 % (ref 11.5–15.5)
WBC: 7.7 10*3/uL (ref 4.0–10.5)

## 2018-02-10 LAB — LIPID PANEL
Cholesterol: 166 mg/dL (ref 0–200)
HDL: 60.8 mg/dL (ref >39.00)
LDL Cholesterol: 94 mg/dL (ref 0–99)
NONHDL: 105.52
Total CHOL/HDL Ratio: 3
Triglycerides: 60 mg/dL (ref 0.0–149.0)
VLDL: 12 mg/dL (ref 0.0–40.0)

## 2018-02-10 LAB — COMPREHENSIVE METABOLIC PANEL
ALT: 23 U/L (ref 0–35)
AST: 18 U/L (ref 0–37)
Albumin: 4.5 g/dL (ref 3.5–5.2)
Alkaline Phosphatase: 106 U/L (ref 39–117)
BUN: 23 mg/dL (ref 6–23)
CHLORIDE: 103 meq/L (ref 96–112)
CO2: 25 mEq/L (ref 19–32)
Calcium: 9.8 mg/dL (ref 8.4–10.5)
Creatinine, Ser: 1.27 mg/dL — ABNORMAL HIGH (ref 0.40–1.20)
GFR: 57.33 mL/min — AB (ref >60.00)
GLUCOSE: 127 mg/dL — AB (ref 70–99)
Potassium: 4.1 mEq/L (ref 3.5–5.1)
SODIUM: 138 meq/L (ref 135–145)
TOTAL PROTEIN: 7.7 g/dL (ref 6.0–8.3)
Total Bilirubin: 0.4 mg/dL (ref 0.2–1.2)

## 2018-02-10 LAB — TSH: TSH: 1.12 u[IU]/mL (ref 0.35–4.50)

## 2018-02-10 LAB — HEMOGLOBIN A1C: HEMOGLOBIN A1C: 7.3 % — AB (ref 4.6–6.5)

## 2018-02-10 NOTE — Patient Instructions (Signed)
Preventive Care 40-64 Years, Female Preventive care refers to lifestyle choices and visits with your health care provider that can promote health and wellness. What does preventive care include?   A yearly physical exam. This is also called an annual well check.  Dental exams once or twice a year.  Routine eye exams. Ask your health care provider how often you should have your eyes checked.  Personal lifestyle choices, including: ? Daily care of your teeth and gums. ? Regular physical activity. ? Eating a healthy diet. ? Avoiding tobacco and drug use. ? Limiting alcohol use. ? Practicing safe sex. ? Taking low-dose aspirin daily starting at age 50. ? Taking vitamin and mineral supplements as recommended by your health care provider. What happens during an annual well check? The services and screenings done by your health care provider during your annual well check will depend on your age, overall health, lifestyle risk factors, and family history of disease. Counseling Your health care provider may ask you questions about your:  Alcohol use.  Tobacco use.  Drug use.  Emotional well-being.  Home and relationship well-being.  Sexual activity.  Eating habits.  Work and work environment.  Method of birth control.  Menstrual cycle.  Pregnancy history. Screening You may have the following tests or measurements:  Height, weight, and BMI.  Blood pressure.  Lipid and cholesterol levels. These may be checked every 5 years, or more frequently if you are over 50 years old.  Skin check.  Lung cancer screening. You may have this screening every year starting at age 55 if you have a 30-pack-year history of smoking and currently smoke or have quit within the past 15 years.  Colorectal cancer screening. All adults should have this screening starting at age 50 and continuing until age 75. Your health care provider may recommend screening at age 45. You will have tests every  1-10 years, depending on your results and the type of screening test. People at increased risk should start screening at an earlier age. Screening tests may include: ? Guaiac-based fecal occult blood testing. ? Fecal immunochemical test (FIT). ? Stool DNA test. ? Virtual colonoscopy. ? Sigmoidoscopy. During this test, a flexible tube with a tiny camera (sigmoidoscope) is used to examine your rectum and lower colon. The sigmoidoscope is inserted through your anus into your rectum and lower colon. ? Colonoscopy. During this test, a long, thin, flexible tube with a tiny camera (colonoscope) is used to examine your entire colon and rectum.  Hepatitis C blood test.  Hepatitis B blood test.  Sexually transmitted disease (STD) testing.  Diabetes screening. This is done by checking your blood sugar (glucose) after you have not eaten for a while (fasting). You may have this done every 1-3 years.  Mammogram. This may be done every 1-2 years. Talk to your health care provider about when you should start having regular mammograms. This may depend on whether you have a family history of breast cancer.  BRCA-related cancer screening. This may be done if you have a family history of breast, ovarian, tubal, or peritoneal cancers.  Pelvic exam and Pap test. This may be done every 3 years starting at age 21. Starting at age 30, this may be done every 5 years if you have a Pap test in combination with an HPV test.  Bone density scan. This is done to screen for osteoporosis. You may have this scan if you are at high risk for osteoporosis. Discuss your test results, treatment options,   and if necessary, the need for more tests with your health care provider. Vaccines Your health care provider may recommend certain vaccines, such as:  Influenza vaccine. This is recommended every year.  Tetanus, diphtheria, and acellular pertussis (Tdap, Td) vaccine. You may need a Td booster every 10 years.  Varicella  vaccine. You may need this if you have not been vaccinated.  Zoster vaccine. You may need this after age 38.  Measles, mumps, and rubella (MMR) vaccine. You may need at least one dose of MMR if you were born in 1957 or later. You may also need a second dose.  Pneumococcal 13-valent conjugate (PCV13) vaccine. You may need this if you have certain conditions and were not previously vaccinated.  Pneumococcal polysaccharide (PPSV23) vaccine. You may need one or two doses if you smoke cigarettes or if you have certain conditions.  Meningococcal vaccine. You may need this if you have certain conditions.  Hepatitis A vaccine. You may need this if you have certain conditions or if you travel or work in places where you may be exposed to hepatitis A.  Hepatitis B vaccine. You may need this if you have certain conditions or if you travel or work in places where you may be exposed to hepatitis B.  Haemophilus influenzae type b (Hib) vaccine. You may need this if you have certain conditions. Talk to your health care provider about which screenings and vaccines you need and how often you need them. This information is not intended to replace advice given to you by your health care provider. Make sure you discuss any questions you have with your health care provider. Document Released: 02/10/2015 Document Revised: 03/06/2017 Document Reviewed: 11/15/2014 Elsevier Interactive Patient Education  2019 [employer].

## 2018-02-10 NOTE — Assessment & Plan Note (Signed)
No new symptoms.

## 2018-02-10 NOTE — Assessment & Plan Note (Signed)
Per endo °

## 2018-02-10 NOTE — Assessment & Plan Note (Signed)
Well controlled, no changes to meds. Encouraged heart healthy diet such as the DASH diet and exercise as tolerated.  °

## 2018-02-10 NOTE — Assessment & Plan Note (Signed)
Tolerating statin, encouraged heart healthy diet, avoid trans fats, minimize simple carbs and saturated fats. Increase exercise as tolerated 

## 2018-02-10 NOTE — Progress Notes (Signed)
Subjective:     Stephanie Lloyd is a 48 y.o. female and is here for a comprehensive physical exam. The patient reports no new problems .  Social History   Socioeconomic History  . Marital status: Married    Spouse name: Not on file  . Number of children: Not on file  . Years of education: Not on file  . Highest education level: Not on file  Occupational History  . Not on file  Social Needs  . Financial resource strain: Not on file  . Food insecurity:    Worry: Not on file    Inability: Not on file  . Transportation needs:    Medical: Not on file    Non-medical: Not on file  Tobacco Use  . Smoking status: Never Smoker  . Smokeless tobacco: Never Used  Substance and Sexual Activity  . Alcohol use: No    Alcohol/week: 0.0 standard drinks    Comment: occ  . Drug use: No  . Sexual activity: Yes    Birth control/protection: None  Lifestyle  . Physical activity:    Days per week: Not on file    Minutes per session: Not on file  . Stress: Not on file  Relationships  . Social connections:    Talks on phone: Not on file    Gets together: Not on file    Attends religious service: Not on file    Active member of club or organization: Not on file    Attends meetings of clubs or organizations: Not on file    Relationship status: Not on file  . Intimate partner violence:    Fear of current or ex partner: Not on file    Emotionally abused: Not on file    Physically abused: Not on file    Forced sexual activity: Not on file  Other Topics Concern  . Not on file  Social History Narrative   ** Merged History Encounter **       Health Maintenance  Topic Date Due  . MAMMOGRAM  08/08/2009  . INFLUENZA VACCINE  04/28/2018 (Originally 08/28/2017)  . PNEUMOCOCCAL POLYSACCHARIDE VACCINE AGE 110-64 HIGH RISK  02/11/2019 (Originally 04/29/1970)  . TETANUS/TDAP  08/06/2019 (Originally 04/29/1987)  . HEMOGLOBIN A1C  08/04/2018  . OPHTHALMOLOGY EXAM  08/16/2018  . FOOT EXAM  02/04/2019  .  PAP SMEAR-Modifier  05/21/2019  . HIV Screening  Completed    The following portions of the patient's history were reviewed and updated as appropriate:  She  has a past medical history of Diabetes (HCC), Diabetes mellitus without complication (HCC), Diastolic dysfunction, Hypertension, Immune deficiency disorder (HCC), Menometrorrhagia (04/01/2016), and Stroke [facility](HCC). She does not have any pertinent problems on file. She  has a past surgical history that includes No past surgeries. Her family history includes Diabetes in her father and paternal uncle; Hypertension in her father; Stroke in her brother, father, and paternal uncle. She  reports that she has never smoked. She has never used smokeless tobacco. She reports that she does not drink alcohol or use drugs. She has a current medication list which includes the following prescription(s): accu-chek softclix lancets, amlodipine, aspirin, atorvastatin, fluticasone, glucose blood, linagliptin, lisinopril, pioglitazone, prednisolone acetate, and solifenacin. Current Outpatient Medications on File Prior to Visit  Medication Sig Dispense Refill  . ACCU-CHEK SOFTCLIX LANCETS lancets Use as directed twice a day.  Dx Code: E11.9 100 each 1  . amLODipine (NORVASC) 10 MG tablet TAKE 1 TABLET BY MOUTH ONCE DAILY 30 tablet  11  . aspirin EC 325 MG EC tablet Take 1 tablet (325 mg total) by mouth daily. 30 tablet 11  . atorvastatin (LIPITOR) 80 MG tablet TAKE 1 TABLET BY MOUTH ONCE DAILY AT 6 PM 90 tablet 1  . fluticasone (FLONASE) 50 MCG/ACT nasal spray Place 2 sprays into both nostrils daily. 16 g 1  . glucose blood (ACCU-CHEK AVIVA PLUS) test strip 1 each by Other route daily. And lancets 1/day 100 each 3  . linagliptin (TRADJENTA) 5 MG TABS tablet Take 1 tablet (5 mg total) by mouth daily. 90 tablet 3  . lisinopril (PRINIVIL,ZESTRIL) 40 MG tablet TAKE 1 TABLET BY MOUTH ONCE DAILY 90 tablet 1  . pioglitazone (ACTOS) 15 MG tablet Take 1 tablet (15 mg total) by  mouth daily. 90 tablet 3  . prednisoLONE acetate (PRED FORTE) 1 % ophthalmic suspension Place 1 drop into both eyes 2 (two) times daily.     . solifenacin (VESICARE) 10 MG tablet Take 1 tablet (10 mg total) by mouth daily. 90 tablet 3   No current facility-administered medications on file prior to visit.    She is allergic to metformin and related..  Review of Systems Review of Systems  Constitutional: Negative for activity change, appetite change and fatigue.  HENT: Negative for hearing loss, congestion, tinnitus and ear discharge.  dentist [id number] Eyes: Negative for visual disturbance (see optho q1y -- vision corrected to 20/20 with glasses).  Respiratory: Negative for cough, chest tightness and shortness of breath.   Cardiovascular: Negative for chest pain, palpitations and leg swelling.  Gastrointestinal: Negative for abdominal pain, diarrhea, constipation and abdominal distention.  Genitourinary: Negative for urgency, frequency, decreased urine volume and difficulty urinating.  Musculoskeletal: Negative for back pain, arthralgias and gait problem.  Skin: Negative for color change, pallor and rash.  Neurological: Negative for dizziness, light-headedness, numbness and headaches.  Hematological: Negative for adenopathy. Does not bruise/bleed easily.  Psychiatric/Behavioral: Negative for suicidal ideas, confusion, sleep disturbance, self-injury, dysphoric mood, decreased concentration and agitation.      Objective:    BP 118/80 (BP Location: Left Arm, Patient Position: Sitting, Cuff Size: Normal)   Pulse 71   Temp 97.6 F (36.4 C) (Oral)   Resp 16   Ht 5\' 10"  (1.778 m)   Wt 201 lb (91.2 kg)   SpO2 100%   BMI 28.84 kg/m  General appearance: alert, cooperative, appears stated age and no distress Head: Normocephalic, without obvious abnormality, atraumatic Eyes: conjunctivae/corneas clear. PERRL, EOM's intact. Fundi benign. Ears: normal TM's and external ear canals both ears Nose:  Nares normal. Septum midline. Mucosa normal. No drainage or sinus tenderness. Throat: lips, mucosa, and tongue normal; teeth and gums normal Neck: no adenopathy, no carotid bruit, no JVD, supple, symmetrical, trachea midline and thyroid not enlarged, symmetric, no tenderness/mass/nodules Back: symmetric, no curvature. ROM normal. No CVA tenderness. Lungs: clear to auscultation bilaterally Breasts: normal appearance, no masses or tenderness, positive findings: thickening L breast Heart: regular rate and rhythm, S1, S2 normal, no murmur, click, rub or gallop Abdomen: soft, non-tender; bowel sounds normal; no masses,  no organomegaly Pelvic: deferred Extremities: extremities normal, atraumatic, no cyanosis or edema Pulses: 2+ and symmetric Skin: Skin color, texture, turgor normal. No rashes or lesions Lymph nodes: Cervical, supraclavicular, and axillary nodes normal. Neurologic: no change   Assessment:    Healthy female exam.      Plan:    ghm utd Check labs  See After Visit Summary for Counseling Recommendations    1.  Preventative health care See above  - CBC with Differential/Platelet - Hemoglobin A1c - Lipid panel - Comprehensive metabolic panel - TSH  2. Essential hypertension Well controlled, no changes to meds. Encouraged heart healthy diet such as the DASH diet and exercise as tolerated.   - Comprehensive metabolic panel  3. Hyperlipidemia, unspecified hyperlipidemia type Encouraged heart healthy diet, increase exercise, avoid trans fats, consider a krill oil cap daily - Lipid panel - Comprehensive metabolic panel  4. Breast thickening  - MM DIAG BREAST TOMO BILATERAL; Future  5. Hyperlipidemia LDL goal <70   6. Type 2 diabetes mellitus with other circulatory complications (HCC) hgba1c to be checked , minimize simple carbs. Increase exercise as tolerated. Continue current meds

## 2018-02-10 NOTE — Assessment & Plan Note (Signed)
Check labs ghm utd See AVS 

## 2018-02-16 ENCOUNTER — Other Ambulatory Visit: Payer: Self-pay

## 2018-02-16 DIAGNOSIS — E1159 Type 2 diabetes mellitus with other circulatory complications: Secondary | ICD-10-CM

## 2018-02-16 MED ORDER — LINAGLIPTIN 5 MG PO TABS: 5.0000 mg | ORAL_TABLET | Freq: Every day | ORAL | 3 refills | Status: AC

## 2018-02-20 ENCOUNTER — Other Ambulatory Visit: Payer: Self-pay | Admitting: Family Medicine

## 2018-02-24 ENCOUNTER — Ambulatory Visit
Admission: RE | Admit: 2018-02-24 | Discharge: 2018-02-24 | Disposition: A | Discharge status: Home / Self Care | Payer: Medicaid Other | Source: Ambulatory Visit | Attending: Family Medicine | Admitting: Family Medicine

## 2018-02-24 DIAGNOSIS — N6459 Other signs and symptoms in breast: Secondary | ICD-10-CM

## 2018-02-25 ENCOUNTER — Other Ambulatory Visit: Payer: Self-pay

## 2018-04-06 DIAGNOSIS — N3941 Urge incontinence: Secondary | ICD-10-CM | POA: Insufficient documentation

## 2018-04-06 DIAGNOSIS — R011 Cardiac murmur, unspecified: Secondary | ICD-10-CM | POA: Insufficient documentation

## 2018-04-13 ENCOUNTER — Ambulatory Visit: Payer: Self-pay | Admitting: Family Medicine

## 2018-05-04 ENCOUNTER — Encounter: Payer: Self-pay | Admitting: Physical Therapy

## 2018-05-04 NOTE — Therapy (Signed)
[facility] [address] [place], [place], [address] Phone: [phone]   Fax:  [phone]  Patient Details  Name: Stephanie Lloyd MRN: [id number] Date of Birth: 1970/11/25 Referring Provider:  Rosalin Hawking, MD  Encounter Date: 05/04/2018  PHYSICAL THERAPY DISCHARGE SUMMARY  Visits from Start of Care: 9  Current functional level related to goals / functional outcomes: See last PT note on 08/06/2017. She did not return for final visit so unable to check goals.    Remaining deficits: See last PT note on 08/06/2017.    Education / Equipment: HEP  Plan: Patient agrees to discharge.  Patient goals were not met. Patient is being discharged due to not returning since the last visit.  ?????         Yafet Cline PT, DPT 05/04/2018, 10:54 AM  [facility] [address] [place], [place], [address] Phone: [phone]   Fax:  [phone]

## 2018-05-23 IMAGING — CT CT HEAD W/O CM
4 series · 16 of 47 positions shown, 18 images · non-contrast
Comparison: CT brain scan of 10/28/2015

CLINICAL DATA: Possible stroke, symptoms began yesterday morning
with slurred speech and weakness

EXAM:
CT HEAD WITHOUT CONTRAST
TECHNIQUE: Contiguous axial images were obtained from the base of the skull
through the vertex without intravenous contrast.

[Series 2: head bone · axial · 0.43mm/px · z∈[-117,-85]mm · 3 of 78 slices shown]
[im 8/78  bone]
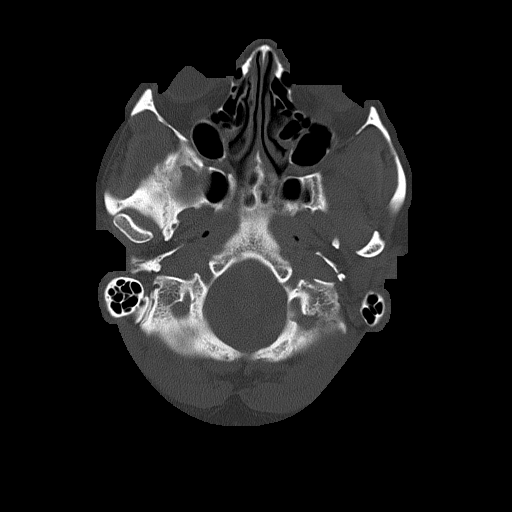
[im 16/78  bone]
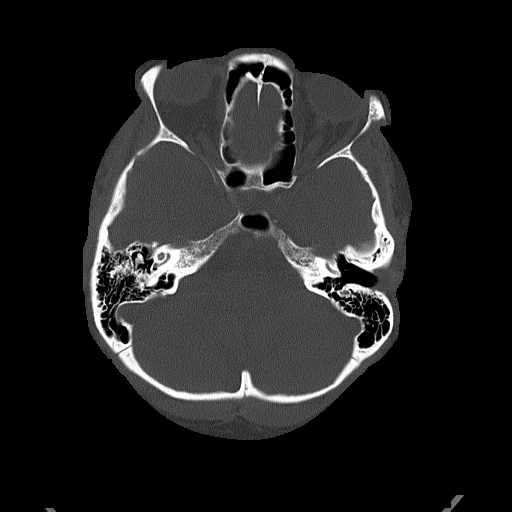
[im 24/78  bone]
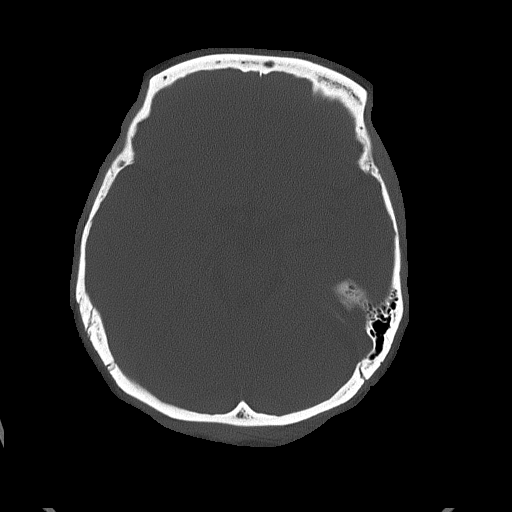

[Series 3: head without · axial · non-contrast · 0.43mm/px · z∈[-116,+4]mm · 7 of 32 slices shown, 9 images]
[im 4/32  brain]
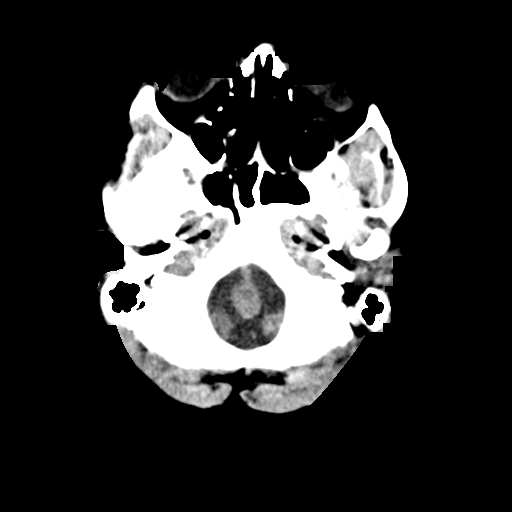
[im 4/32  bone]
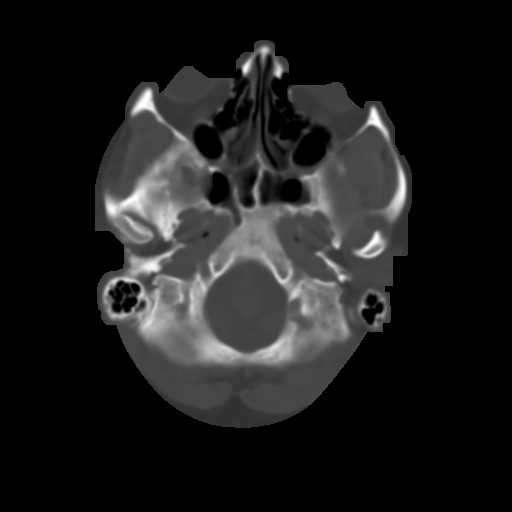
[im 8/32  brain]
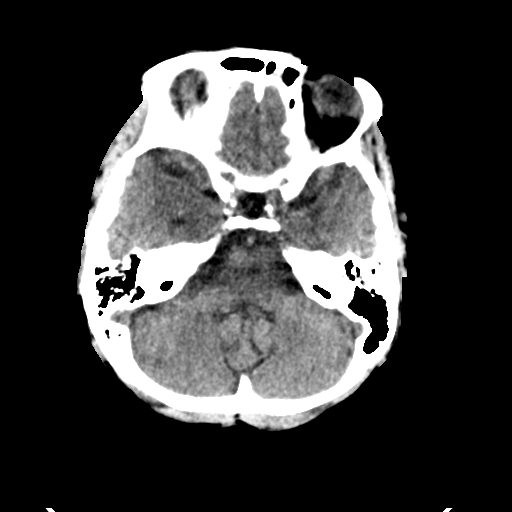
[im 12/32  brain]
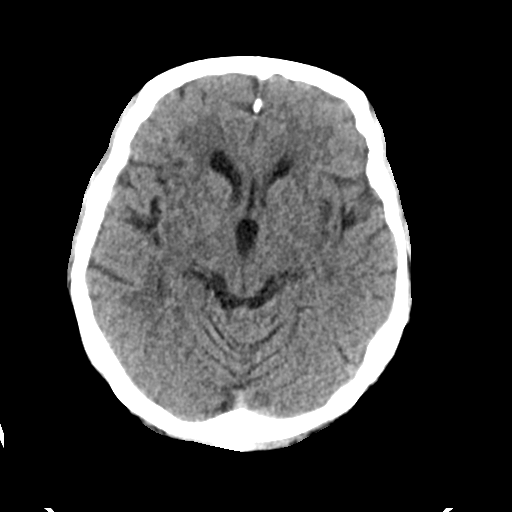
[im 16/32  brain]
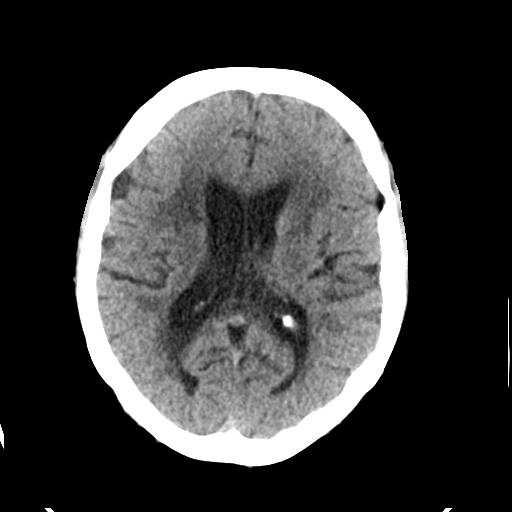
[im 20/32  brain]
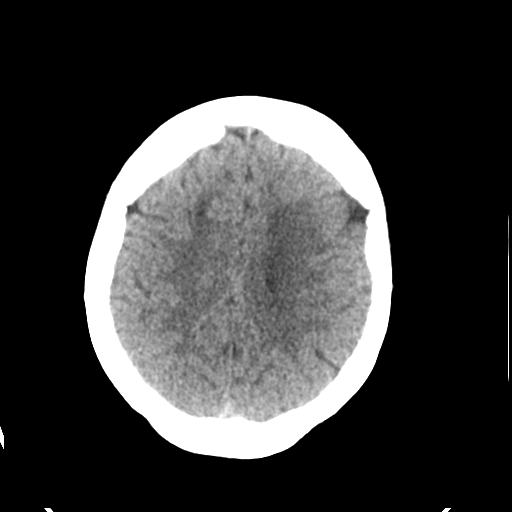
[im 20/32  bone]
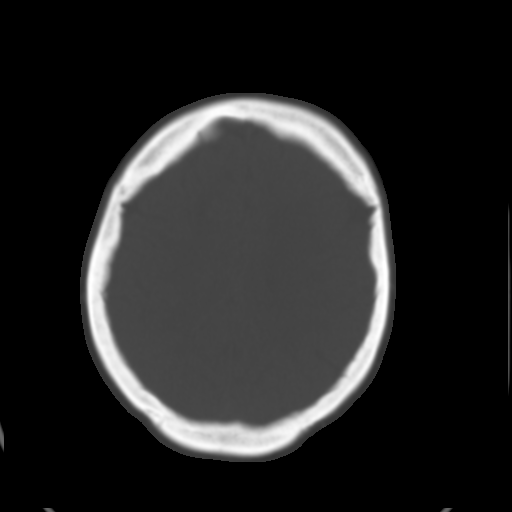
[im 24/32  brain]
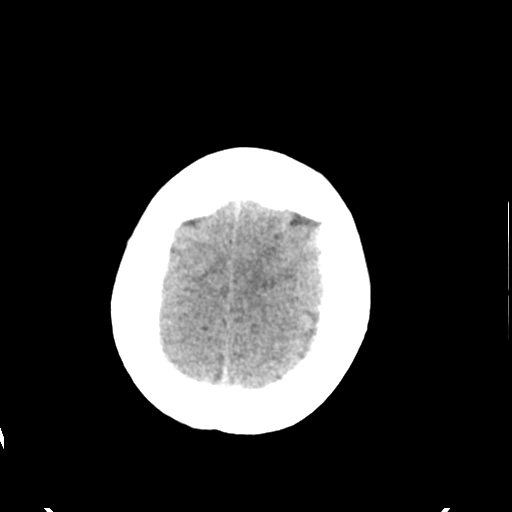
[im 28/32  brain]
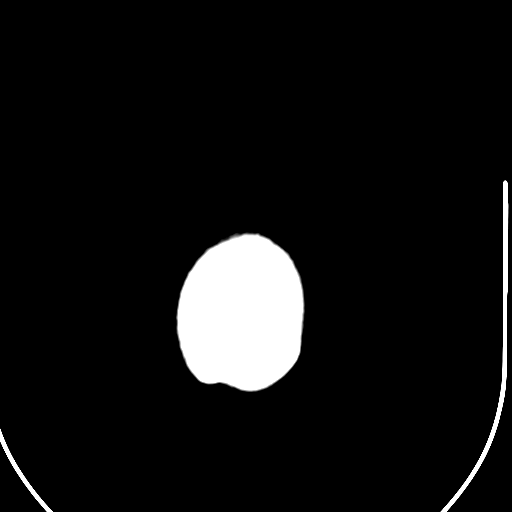

[Series 4: head without cor · coronal · non-contrast · 0.30mm/px · 3 of 66 slices shown]
[im 22/66  brain]
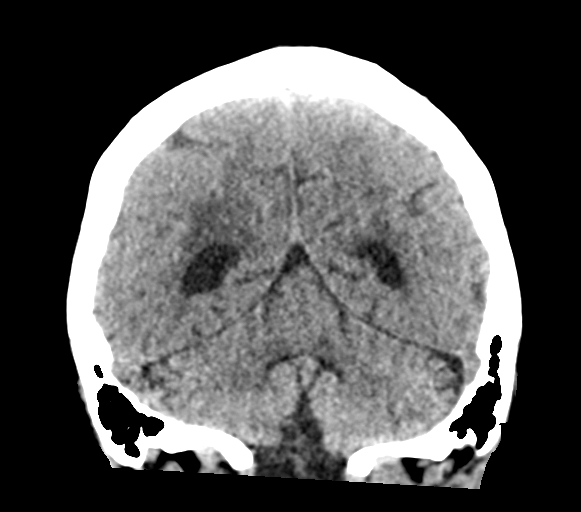
[im 29/66  brain]
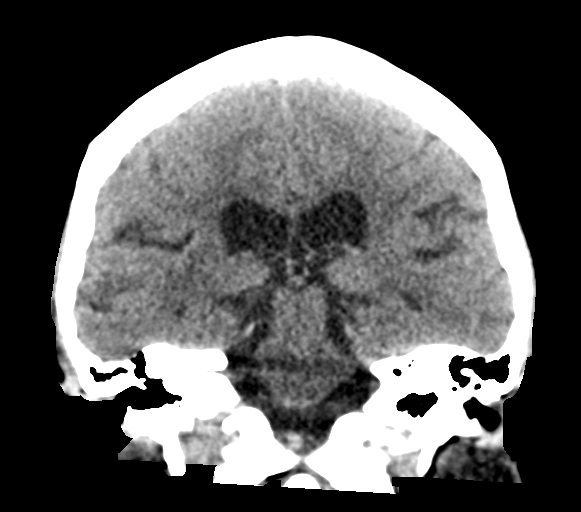
[im 37/66  brain]
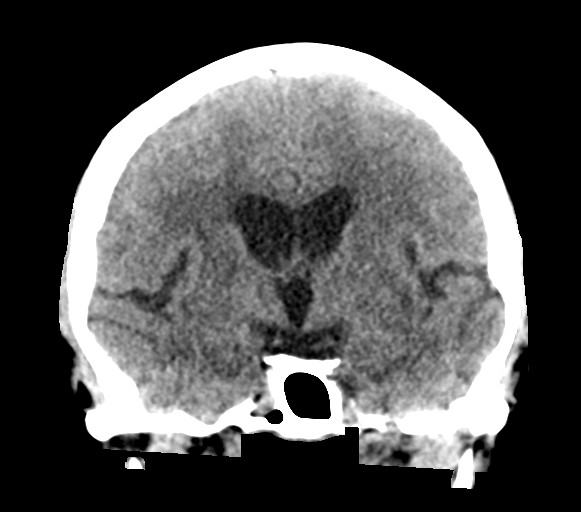

[Series 5: head without sag · sagittal · non-contrast · 0.30mm/px · 3 of 55 slices shown]
[im 19/55  brain]
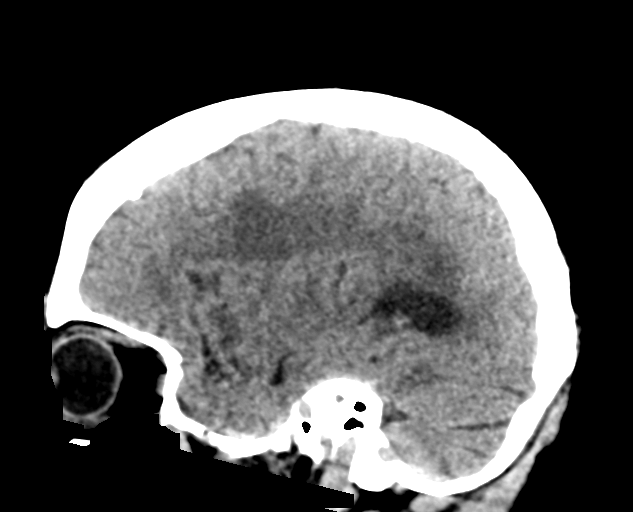
[im 28/55  brain]
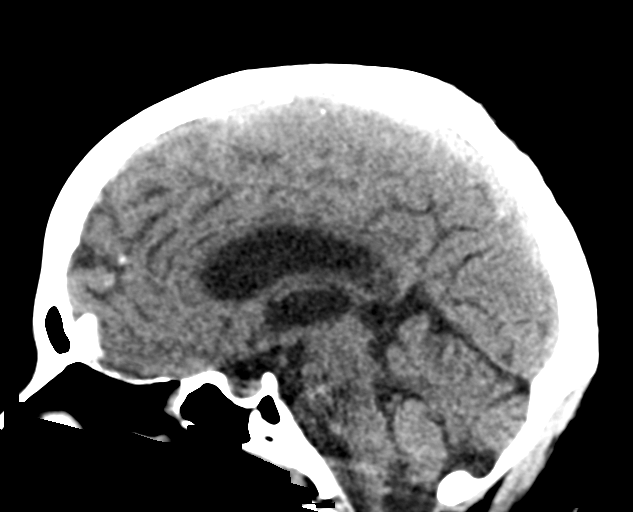
[im 37/55  brain]
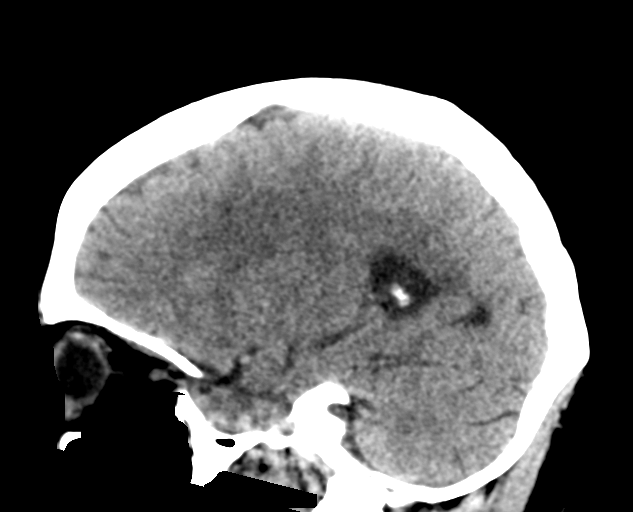

[16 of 47 positions shown; findings below may reference images not displayed]

FINDINGS: Brain: The ventricular system remains prominent, as are the cortical
sulci, consistent with atrophy advanced for age. The septum is
midline in position. Somewhat prominent cerebellar folia also are
noted. The fourth ventricle and basilar cisterns are unremarkable.
No hemorrhage, mass effect or in acute infarction is noted. There is
significant small vessel ischemic change throughout the
periventricular white matter for age which may have advanced
slightly in the interval as well.

Vascular: No vascular abnormality is seen on this unenhanced study.

Skull: On bone window images, no calvarial abnormality is seen.

Sinuses/Orbits: There is no evidence of sinusitis. A probable
retention cyst is noted anteriorly in the left maxillary sinus.

Other:

1. Advanced diffuse atrophy for age with some progression of small
vessel ischemic change. No acute intracranial abnormality.
2. Probable retention cyst in the left maxillary sinus.

## 2018-05-23 IMAGING — MR MR HEAD W/O CM
9 of 10 series · 36 of 48 positions shown · non-contrast
Comparison: Head CT same day.  MRI 10/08/2015

CLINICAL DATA: Generalize weakness beginning yesterday. Unable to
get out of bit head.

EXAM:
MRI HEAD WITHOUT CONTRAST
TECHNIQUE: Multiplanar, multiecho pulse sequences of the brain and surrounding
structures were obtained without intravenous contrast.

[Series 3: DWI · axial · 3.0mm · 1.09mm/px · z∈[-33,+99]mm · 9 of 90 slices shown (1 of 4)]
[im 1/90]
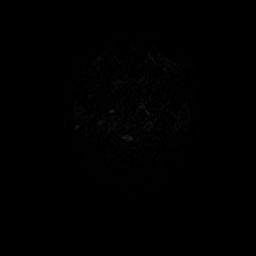
[im 12/90]
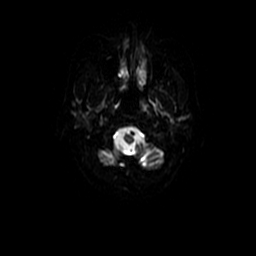
[im 23/90]
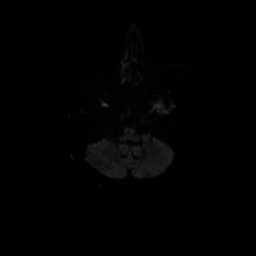
[im 34/90]
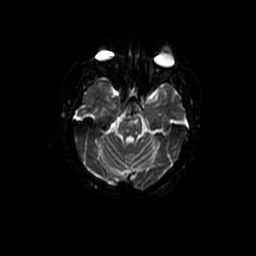
[im 45/90]
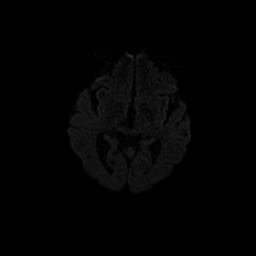
[im 56/90]
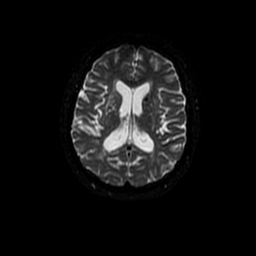
[im 67/90]
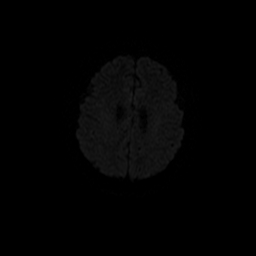
[im 78/90]
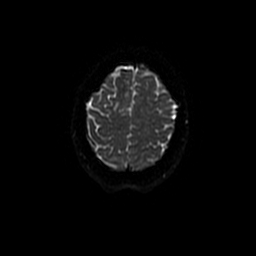
[im 90/90]
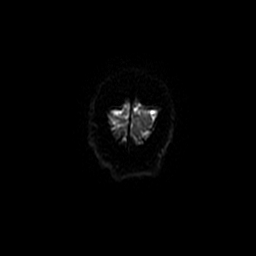

[Series 4: T2 · axial · 5.0mm · 0.43mm/px · z∈[-33,+99]mm · 2 of 23 slices shown (1 of 2)]
[im 1/23]
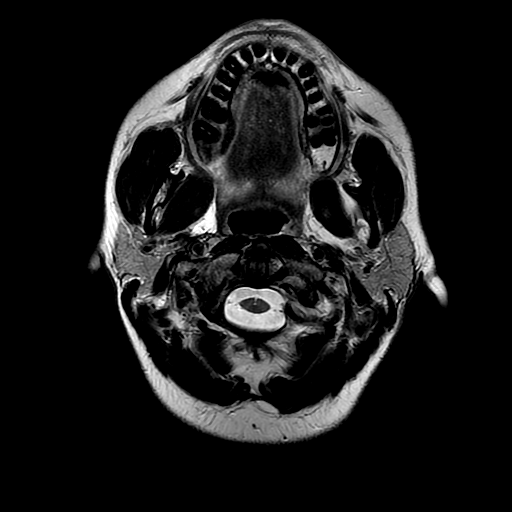
[im 23/23]
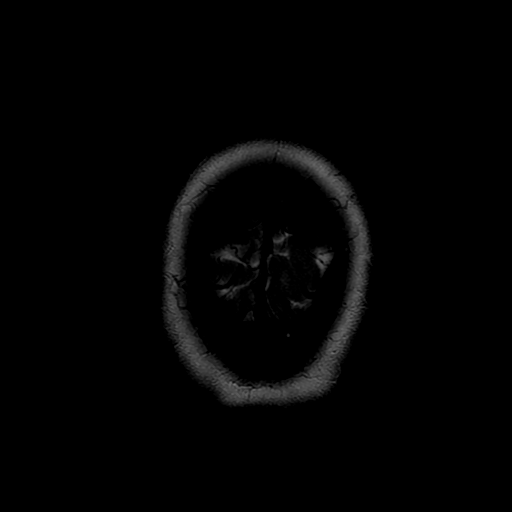

[Series 5: DWI · coronal · 5.0mm · 1.09mm/px · 7 of 60 slices shown (2 of 4)]
[im 1/60]
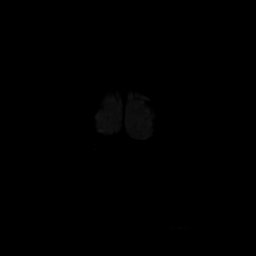
[im 10/60]
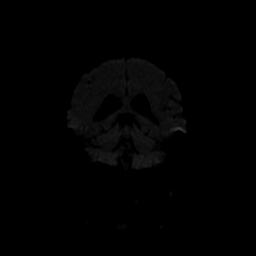
[im 20/60]
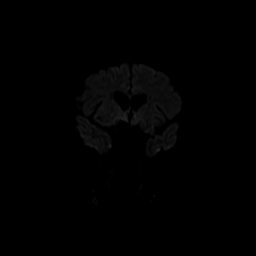
[im 30/60]
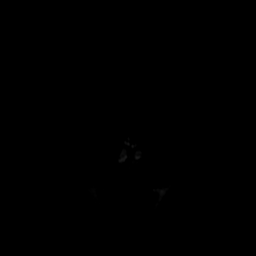
[im 40/60]
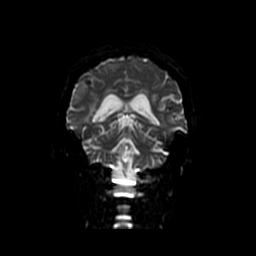
[im 50/60]
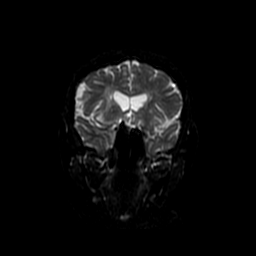
[im 60/60]
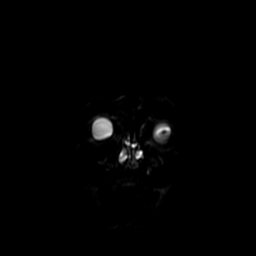

[Series 6: FLAIR · axial · 5.0mm · 0.43mm/px · z∈[-33,+99]mm · 3 of 23 slices shown]
[im 1/23]
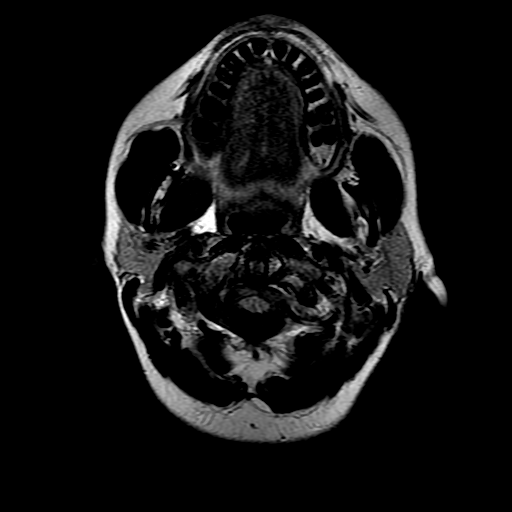
[im 12/23]
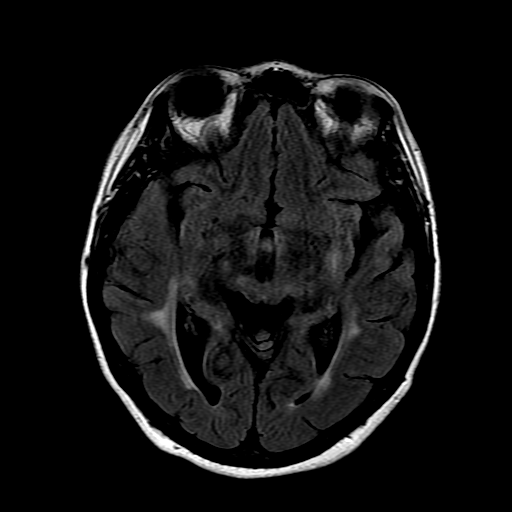
[im 23/23]
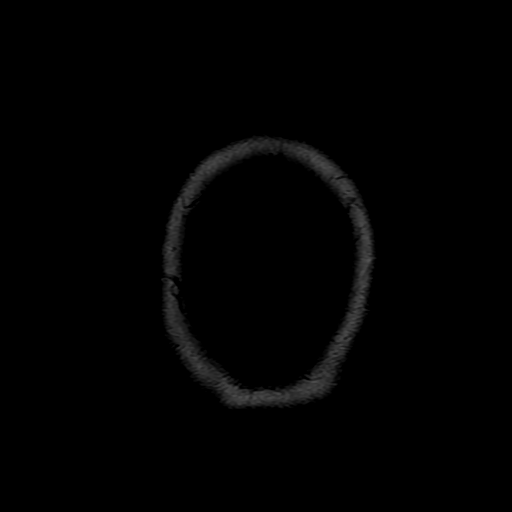

[Series 7: T1 · sagittal · 5.0mm · 0.47mm/px · 3 of 23 slices shown]
[im 1/23]
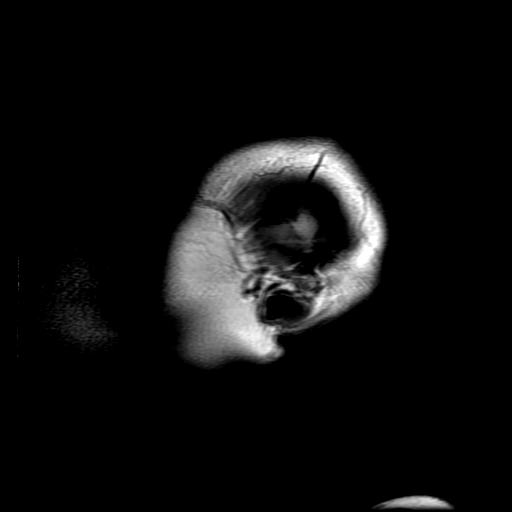
[im 12/23]
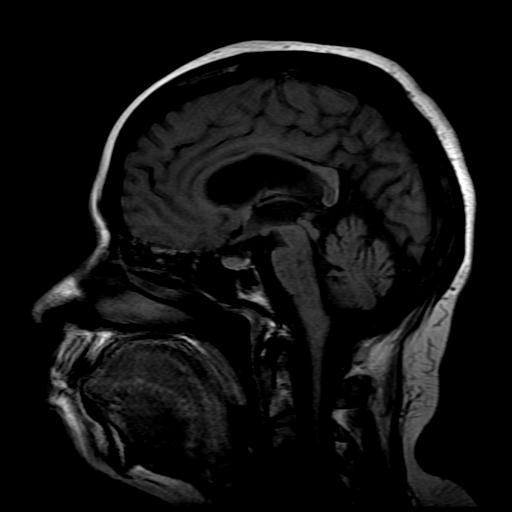
[im 23/23]
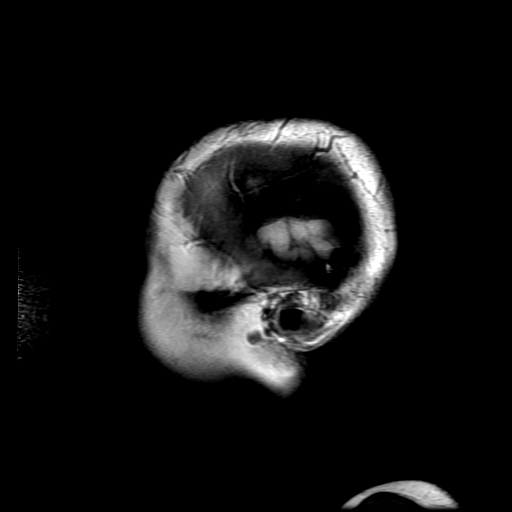

[Series 8: ax mpgr · axial · 5.0mm · 0.43mm/px · 1 of 23 slices shown]
[im 1/23]
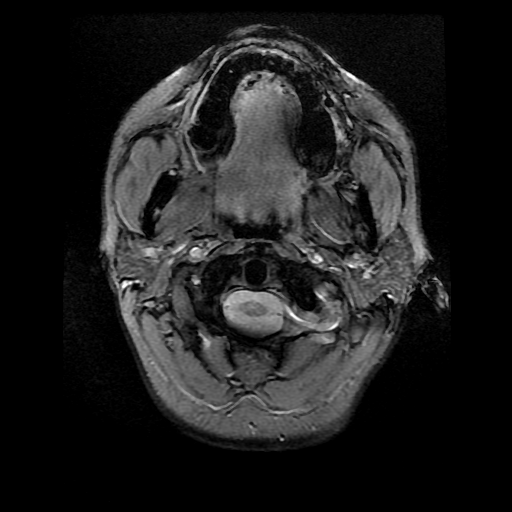

[Series 10: T2 · coronal · 5.0mm · 0.39mm/px · 3 of 23 slices shown (2 of 2)]
[im 1/23]
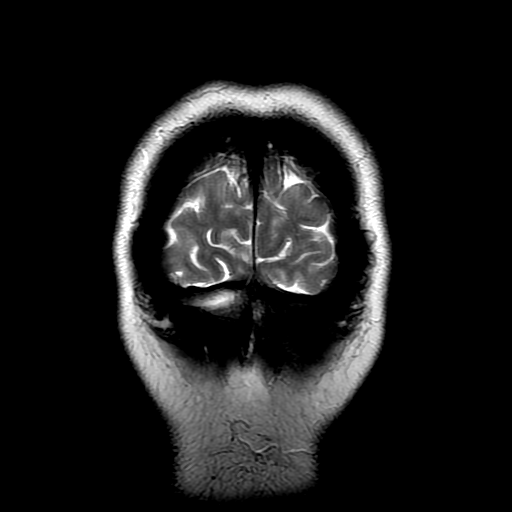
[im 12/23]
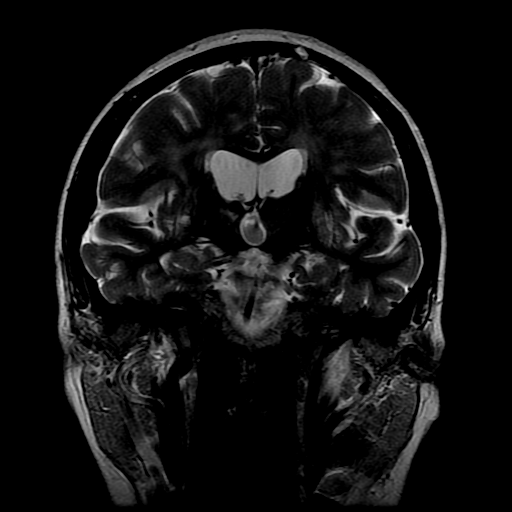
[im 23/23]
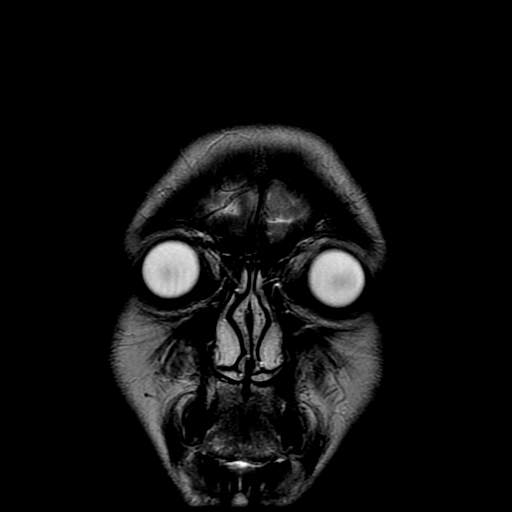

[Series 300: DWI · axial · 3.0mm · 1.09mm/px · z∈[-33,+99]mm · 5 of 45 slices shown (3 of 4)]
[im 1/45]
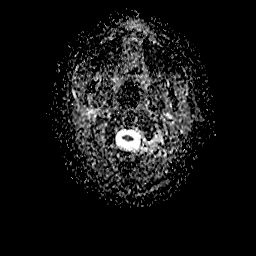
[im 12/45]
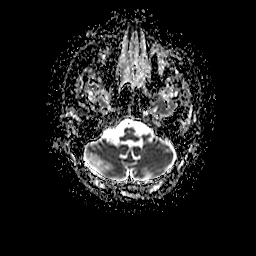
[im 23/45]
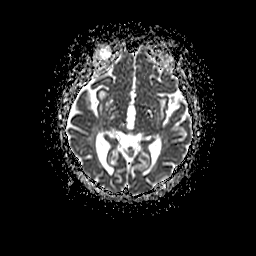
[im 34/45]
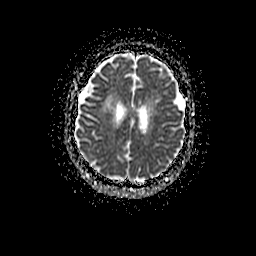
[im 45/45]
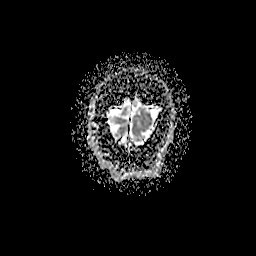

[Series 500: DWI · coronal · 5.0mm · 1.09mm/px · 3 of 30 slices shown (4 of 4)]
[im 1/30]
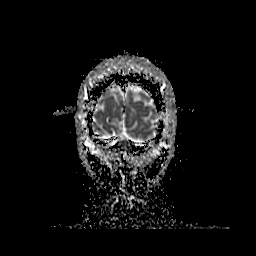
[im 15/30]
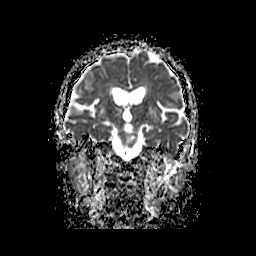
[im 30/30]
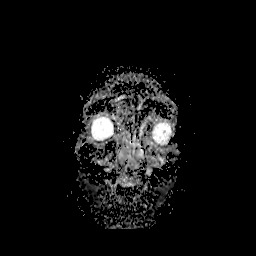

[36 of 48 positions shown; findings below may reference images not displayed]

FINDINGS: Brain: Diffusion imaging shows acute infarction within the left side
of the pons. There is a punctate acute infarction in the deep
insular cortex on the right. Chronic small-vessel ischemic changes
affect the pons bilaterally. No focal cerebellar insult. Cerebral
hemispheres show old hemorrhagic infarction in both thalami and
throughout the deep and subcortical hemispheric white matter. No
large vessel territory infarction. No mass lesions. No sign of acute
hemorrhage. No hydrocephalus or extra-axial collection.

Vascular: Major vessels at the base of the brain show flow.

Skull and upper cervical spine: Negative

Sinuses/Orbits: Clear/normal

Other: None significant
IMPRESSION: Acute infarction affecting the left para median pons. Small focus of
acute infarction in the right insular cortex.

Extensive old ischemic changes throughout the brainstem, thalami and
cerebral hemispheric white matter. Many of the old infarctions
demonstrate hemosiderin deposition.

## 2018-06-23 ENCOUNTER — Encounter (INDEPENDENT_AMBULATORY_CARE_PROVIDER_SITE_OTHER): Payer: Medicaid Other | Admitting: Ophthalmology

## 2018-06-25 NOTE — Progress Notes (Signed)
[facility] - Clinic Note  06/26/2018     CHIEF COMPLAINT Patient presents for Retina Evaluation   HISTORY OF PRESENT ILLNESS: Stephanie Lloyd is a 48 y.o. female who presents to the clinic today for:   HPI    Retina Evaluation    In right eye.  This started 12 months ago.  Duration of 12 months.  Associated Symptoms Flashes and Floaters.  Context:  distance vision and near vision.  I, the attending physician,  performed the HPI with the patient and updated documentation appropriately.          Comments    BS:128 A1c:6.9 Patient states she has noticed an overall decrease in her vision over the last year.  She complains that her distance vision and near vision is not sharp.  She complains of floaters and flashes of light in both eyes that began about 2 months ago--she states that they come and go.  Patient denies eye pain or discomfort.  Patient states she has had laser surgery in the past (approx 2 years ago) for bleeding in retina in both eyes--performed by Dr. Constance Goltz [facility])       Last edited by Rennis Chris, MD on 06/26/2018  1:53 PM. (History)    pt states she had laser eye surgery with Dr. Constance Goltz due to bleeding in the retina. Patient has an upcoming appointment in a couple of months for follow up with Dr. Constance Goltz.  Patient lives close to [facility] but got an appointment with Dr. Krista Blue recently due to wait time with an Ophthalmologist in [place] area.  Referring physician: Demarco, [place], OD [address] [place], [place] [address]  HISTORICAL INFORMATION:   Selected notes from the MEDICAL RECORD NUMBER Referred by Dr. [place] DeMarco for concern of TRD OD LEE: 05.22.20 (J. DeMarco) [BCVA: OD: OS:] Ocular Hx- PMH-DM (takes Tradjenta, last A1c: 6.5 01.07.20), HTN, stroke   CURRENT MEDICATIONS: Current Outpatient Medications (Ophthalmic Drugs)  Medication Sig  . prednisoLONE acetate (PRED FORTE) 1 % ophthalmic suspension  Place 1 drop into both eyes 2 (two) times daily.    No current facility-administered medications for this visit.  (Ophthalmic Drugs)   Current Outpatient Medications (Other)  Medication Sig  . ACCU-CHEK SOFTCLIX LANCETS lancets Use as directed twice a day.  Dx Code: E11.9  . amLODipine (NORVASC) 10 MG tablet TAKE 1 TABLET BY MOUTH ONCE DAILY  . aspirin EC 325 MG EC tablet Take 1 tablet (325 mg total) by mouth daily.  Marland Kitchen atorvastatin (LIPITOR) 80 MG tablet TAKE 1 TABLET BY MOUTH ONCE DAILY AT 6 PM  . fluticasone (FLONASE) 50 MCG/ACT nasal spray Place 2 sprays into both nostrils daily.  Marland Kitchen glucose blood (ACCU-CHEK AVIVA PLUS) test strip 1 each by Other route daily. And lancets 1/day  . linagliptin (TRADJENTA) 5 MG TABS tablet Take 1 tablet (5 mg total) by mouth daily.  Marland Kitchen lisinopril (PRINIVIL,ZESTRIL) 40 MG tablet TAKE 1 TABLET BY MOUTH ONCE DAILY  . pioglitazone (ACTOS) 15 MG tablet Take 1 tablet (15 mg total) by mouth daily.  . solifenacin (VESICARE) 10 MG tablet Take 1 tablet (10 mg total) by mouth daily.   No current facility-administered medications for this visit.  (Other)      REVIEW OF SYSTEMS: ROS    Positive for: Eyes   Negative for: Constitutional, Gastrointestinal, Neurological, Skin, Genitourinary, Musculoskeletal, HENT, Endocrine, Cardiovascular, Respiratory, Psychiatric, Allergic/Imm, Heme/Lymph   Last edited by Corrinne Eagle on 06/26/2018  1:18 PM. (  History)       ALLERGIES Allergies  Allergen Reactions  . Metformin And Related Diarrhea    PAST MEDICAL HISTORY Past Medical History:  Diagnosis Date  . Diabetes (HCC)   . Diabetes mellitus without complication (HCC)   . Diastolic dysfunction   . Hypertension   . Immune deficiency disorder (HCC)   . Menometrorrhagia 04/01/2016  . Stroke [facility](HCC)    Past Surgical History:  Procedure Laterality Date  . NO PAST SURGERIES      FAMILY HISTORY Family History  Problem Relation Age of Onset  . Diabetes Father   .  Hypertension Father   . Stroke Father   . Diabetes Paternal Uncle   . Stroke Paternal Uncle   . Stroke Brother   . Cancer Neg Hx   . Breast cancer Neg Hx   . Ovarian cancer Neg Hx   . Colon cancer Neg Hx   . Esophageal cancer Neg Hx     SOCIAL HISTORY Social History   Tobacco Use  . Smoking status: Never Smoker  . Smokeless tobacco: Never Used  Substance Use Topics  . Alcohol use: No    Alcohol/week: 0.0 standard drinks    Comment: occ  . Drug use: No         OPHTHALMIC EXAM:  Base Eye Exam    Visual Acuity (Snellen - Linear)      Right Left   Dist cc 20/50 -2 20/50   Dist ph cc 20/40 20/40   Correction:  Glasses       Tonometry (Tonopen, 1:33 PM)      Right Left   Pressure 14 15       Pupils      Dark Light Shape React APD   Right 4 3 Round Brisk 0   Left 4 3 Round Brisk 0       Visual Fields      Left Right    Full Full       Extraocular Movement      Right Left    Full Full       Neuro/Psych    Oriented x3:  Yes   Mood/Affect:  Normal       Dilation    Both eyes:  1.0% Mydriacyl, 2.5% Phenylephrine @ 1:30 PM        Slit Lamp and Fundus Exam    Slit Lamp Exam      Right Left   Lids/Lashes Dermatochalasis - upper lid Dermatochalasis - upper lid, mild MGD   Conjunctiva/Sclera White and quiet, mild melanosis mild melanosis   Cornea 1+ Punctate epithelial erosions 1+ Punctate epithelial erosions   Anterior Chamber Deep and quiet Deep and quiet   Iris Round and dilated, neg NVI Round and dilated; no NVI   Lens 1+ Nuclear sclerosis, 1+ Cortical cataract, Trace Posterior subcapsular cataract 1+ Nuclear sclerosis, 1+ Cortical cataract   Vitreous mild Vitreous syneresis, trace residual VH inferiorly mild Vitreous syneresis, old VH settled inferiorly       Fundus Exam      Right Left   Disc +fibrosis and NVD, Rim sharp +fibrosis, +NVD, pink and sharp   C/D Ratio 0.3 0.2   Macula Flat, blunted foveal reflex, +ERM, +NV, +IRH, +cystic changes  Flat, good foveal reflex, mild RPE mottling, +NV, +MAs, +ERM   Vessels Vascular attenuation, Tortuous, Copper wiring, +NV Vascular attenuation, Tortuous, +NV   Periphery Attached, 360 PRP with room for fill in, scattered IRH, massive fibrotic NV nasal  to disc    Attached, severe fibrotic NV with traction and heme nasal to disc        Refraction    Wearing Rx      Sphere Cylinder Axis   Right -5.50 +0.50 180   Left -4.25 Sphere    Type:  SVD       Manifest Refraction      Sphere Cylinder Axis Dist VA   Right -6.25 +0.50 180 20/40   Left -5.25 Sphere  20/25          IMAGING AND PROCEDURES  Imaging and Procedures for @TODAY @  OCT, Retina - OU - Both Eyes       Right Eye Quality was good. Central Foveal Thickness: 228. Progression has no prior data. Findings include no SRF, abnormal foveal contour, intraretinal fluid, central retinal atrophy, outer retinal atrophy, epiretinal membrane, preretinal fibrosis, macular pucker, vitreous traction, inner retinal atrophy (dense preretinal fibrosis nasal to disc creating tractional schisis ).   Left Eye Quality was good. Central Foveal Thickness: 233. Progression has no prior data. Findings include abnormal foveal contour, intraretinal fluid, no SRF, epiretinal membrane, preretinal fibrosis, inner retinal atrophy (tractional schisis IN macula, preretinal fibrosis eminating from disc extending along arcades).   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 CME - cystoid macular edema. PED - pigment epithelial detachment. IRF - intraretinal fluid. SRF - subretinal fluid. EZ - ellipsoid zone. ERM - epiretinal membrane. ORA - outer retinal atrophy. ORT - outer retinal tubulation. SRHM - subretinal hyper-reflective material        Fluorescein Angiography Optos (Transit OD)        Right Eye   Progression has no prior data. Early phase findings include delayed filling, vascular perfusion defect, retinal neovascularization, neovascularization disc, leakage. Mid/Late phase findings include delayed filling, leakage, neovascularization disc, retinal neovascularization, vascular perfusion defect.   Left Eye   Progression has no prior data. Early phase findings include retinal neovascularization, leakage, vascular perfusion defect. Mid/Late phase findings include leakage, retinal neovascularization, vascular perfusion defect.   Notes Images stored on drive;   Impression: PDR OU Florid NV nasally OU                  ASSESSMENT/PLAN:    ICD-10-CM   1. Proliferative diabetic retinopathy of both eyes with macular edema associated with type 2 diabetes mellitus (HCC) N16.5790 Fluorescein Angiography Optos (Transit OD)  2. Retinal edema H35.81 OCT, Retina - OU - Both Eyes  3. Essential hypertension I10   4. Hypertensive retinopathy of both eyes H35.033 Fluorescein Angiography Optos (Transit OD)  5. Combined forms of age-related cataract of both eyes H25.813   6. Myopia of both eyes H52.13     1,2. Proliferative diabetic retinopathy w/ DME, OU  - pt with history of diabetic retinopathy managed by Dr. Constance Goltz in [place] -- last visit was August 2019 and was cleared to f/u in 1 year per pt (upcoming f/u appt in August 2020)  - The incidence, risk factors for progression, natural history and treatment options for diabetic retinopathy were discussed with patient.    - The need for close monitoring of blood glucose, blood pressure, and serum lipids, avoiding cigarette or any type of tobacco, and the need for long term follow up was also discussed with patient.  - exam shows florid, active  NVE OU, 360 PRP with room for fill in OU  - BCVA pretty good at 20/40 OD, 20/25 OS  - FA today (05.29.20) shows florid NV nasally OU and areas of vascular nonperfusion  - OCT  shows +diabetic macular edema, both eyes, with +tractional schisis  - discussed findings,prognosis and recommended treatment  - recommend PRP OU, OD first today, 05.29.20  - pt wishes to talk to Dr. Constance Goltz first before proceeding -- recommend moving August appointment sooner with Dr. Constance Goltz  - f/u here PRN  3,4. Hypertensive retinopathy OU  - discussed importance of tight BP control  - monitor  5. Mixed form age related cataracts OU   - The symptoms of cataract, surgical options, and treatments and risks were discussed with patient.  - discussed diagnosis and progression  - not yet visually significant  - monitor for now  6. Myopia OU - under the expert management of Dr. Krista Blue - recommend holding off on new spectacles until retina more stable   Ophthalmic Meds Ordered this visit:  No orders of the defined types were placed in this encounter.      Return if symptoms worsen or fail to improve.  There are no Patient Instructions on file for this visit.   Explained the diagnoses, plan, and follow up with the patient and they expressed understanding.  Patient expressed understanding of the importance of proper follow up care.   This document serves as a record of services personally performed by Karie Chimera, MD, PhD. It was created on their behalf by Laurian Brim, OA, an ophthalmic assistant. The creation of this record is the provider's dictation and/or activities during the visit.    Electronically signed by: Laurian Brim, OA  05.28.2020 5:00 PM    Karie Chimera, M.D., Ph.D. Diseases & Surgery of the Retina and Vitreous [facility]  I have reviewed the above documentation for accuracy and completeness, and I agree with the above. Karie Chimera, M.D., Ph.D. 06/26/18 5:00 PM    Abbreviations: M myopia (nearsighted); A astigmatism; H hyperopia (farsighted); P presbyopia; Mrx spectacle prescription;  CTL contact lenses; OD right eye; OS  left eye; OU both eyes  XT exotropia; ET esotropia; PEK punctate epithelial keratitis; PEE punctate epithelial erosions; DES dry eye syndrome; MGD meibomian gland dysfunction; ATs artificial tears; PFAT's preservative free artificial tears; NSC nuclear sclerotic cataract; PSC posterior subcapsular cataract; ERM epi-retinal membrane; PVD posterior vitreous detachment; RD retinal detachment; DM diabetes mellitus; DR diabetic retinopathy; NPDR non-proliferative diabetic retinopathy; PDR proliferative diabetic retinopathy; CSME clinically significant macular edema; DME diabetic macular edema; dbh dot blot hemorrhages; CWS cotton wool spot; POAG primary open angle glaucoma; C/D cup-to-disc ratio; HVF humphrey visual field; GVF goldmann visual field; OCT optical coherence tomography; IOP intraocular pressure; BRVO Branch retinal vein occlusion; CRVO central retinal vein occlusion; CRAO central retinal artery occlusion; BRAO branch retinal artery occlusion; RT retinal tear; SB scleral buckle; PPV pars plana vitrectomy; VH Vitreous hemorrhage; PRP panretinal laser photocoagulation; IVK intravitreal kenalog; VMT vitreomacular traction; MH Macular hole;  NVD neovascularization of the disc; NVE neovascularization elsewhere; AREDS age related eye disease study; ARMD age related macular degeneration; POAG primary open angle glaucoma; EBMD epithelial/anterior basement membrane dystrophy; ACIOL anterior chamber intraocular lens; IOL intraocular lens; PCIOL posterior chamber intraocular lens; Phaco/IOL phacoemulsification with intraocular lens placement; PRK photorefractive keratectomy; LASIK laser assisted in situ keratomileusis; HTN hypertension; DM diabetes mellitus; COPD chronic obstructive pulmonary disease

## 2018-06-26 ENCOUNTER — Other Ambulatory Visit: Payer: Self-pay

## 2018-06-26 ENCOUNTER — Ambulatory Visit (INDEPENDENT_AMBULATORY_CARE_PROVIDER_SITE_OTHER): Payer: Medicaid Other | Admitting: Ophthalmology

## 2018-06-26 ENCOUNTER — Encounter (INDEPENDENT_AMBULATORY_CARE_PROVIDER_SITE_OTHER): Payer: Self-pay | Admitting: Ophthalmology

## 2018-06-26 DIAGNOSIS — H3581 Retinal edema: Secondary | ICD-10-CM

## 2018-06-26 DIAGNOSIS — H35033 Hypertensive retinopathy, bilateral: Secondary | ICD-10-CM | POA: Diagnosis not present

## 2018-06-26 DIAGNOSIS — E113513 Type 2 diabetes mellitus with proliferative diabetic retinopathy with macular edema, bilateral: Secondary | ICD-10-CM

## 2018-06-26 DIAGNOSIS — I10 Essential (primary) hypertension: Secondary | ICD-10-CM | POA: Diagnosis not present

## 2018-06-26 DIAGNOSIS — H25813 Combined forms of age-related cataract, bilateral: Secondary | ICD-10-CM

## 2018-06-26 DIAGNOSIS — H5213 Myopia, bilateral: Secondary | ICD-10-CM

## 2018-07-23 ENCOUNTER — Other Ambulatory Visit: Payer: Self-pay | Admitting: Family Medicine

## 2018-08-04 ENCOUNTER — Ambulatory Visit: Payer: Self-pay | Admitting: Endocrinology

## 2019-12-27 DIAGNOSIS — I69311 Memory deficit following cerebral infarction: Secondary | ICD-10-CM | POA: Insufficient documentation

## 2019-12-27 DIAGNOSIS — I69314 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: Secondary | ICD-10-CM | POA: Insufficient documentation

## 2023-01-16 ENCOUNTER — Encounter: Payer: Self-pay | Admitting: Family

## 2023-01-17 ENCOUNTER — Encounter: Payer: Self-pay | Admitting: Family

## 2023-01-31 ENCOUNTER — Ambulatory Visit: Payer: Medicaid Other | Admitting: Family Medicine

## 2023-02-16 ENCOUNTER — Telehealth: Payer: Medicare HMO | Admitting: Physician Assistant

## 2023-02-16 ENCOUNTER — Telehealth: Payer: Self-pay | Admitting: Physician Assistant

## 2023-02-16 NOTE — Progress Notes (Signed)
The patient no-showed for appointment despite this provider sending direct link,  and waiting for at least 10 minutes from appointment time for patient to join. They will be marked as a NS for this appointment/time.   Kasandra Knudsen Mayers, PA-C

## 2023-04-25 ENCOUNTER — Other Ambulatory Visit (HOSPITAL_BASED_OUTPATIENT_CLINIC_OR_DEPARTMENT_OTHER): Payer: Self-pay

## 2023-04-25 ENCOUNTER — Encounter (HOSPITAL_BASED_OUTPATIENT_CLINIC_OR_DEPARTMENT_OTHER): Payer: Self-pay | Admitting: Emergency Medicine

## 2023-04-25 ENCOUNTER — Emergency Department (HOSPITAL_BASED_OUTPATIENT_CLINIC_OR_DEPARTMENT_OTHER)

## 2023-04-25 ENCOUNTER — Other Ambulatory Visit: Payer: Self-pay

## 2023-04-25 ENCOUNTER — Encounter: Payer: Self-pay | Admitting: Family

## 2023-04-25 ENCOUNTER — Emergency Department (HOSPITAL_BASED_OUTPATIENT_CLINIC_OR_DEPARTMENT_OTHER): Admission: EM | Admit: 2023-04-25 | Discharge: 2023-04-25 | Disposition: A | Discharge status: Home / Self Care

## 2023-04-25 DIAGNOSIS — M542 Cervicalgia: Secondary | ICD-10-CM | POA: Insufficient documentation

## 2023-04-25 DIAGNOSIS — Z79899 Other long term (current) drug therapy: Secondary | ICD-10-CM | POA: Diagnosis not present

## 2023-04-25 DIAGNOSIS — Z7982 Long term (current) use of aspirin: Secondary | ICD-10-CM | POA: Insufficient documentation

## 2023-04-25 DIAGNOSIS — E119 Type 2 diabetes mellitus without complications: Secondary | ICD-10-CM | POA: Diagnosis not present

## 2023-04-25 DIAGNOSIS — I10 Essential (primary) hypertension: Secondary | ICD-10-CM | POA: Diagnosis not present

## 2023-04-25 MED ORDER — LIDOCAINE 5 % EX PTCH
1.0000 | MEDICATED_PATCH | CUTANEOUS | Status: DC
  Administered 2023-04-25: 1 via TRANSDERMAL
  Filled 2023-04-25: qty 1

## 2023-04-25 MED ORDER — LIDOCAINE 5 % EX PTCH: 1.0000 | MEDICATED_PATCH | CUTANEOUS | 0 refills | Status: DC

## 2023-04-25 MED ORDER — LIDOCAINE 5 % EX PTCH
1.0000 | MEDICATED_PATCH | CUTANEOUS | 0 refills | Status: AC
  Filled 2023-04-25: qty 30, 30d supply, fill #0

## 2023-04-25 MED ORDER — KETOROLAC TROMETHAMINE 15 MG/ML IJ SOLN
15.0000 mg | Freq: Once | INTRAMUSCULAR | Status: AC
  Administered 2023-04-25: 15 mg via INTRAMUSCULAR
  Filled 2023-04-25: qty 1

## 2023-04-25 MED ORDER — CYCLOBENZAPRINE HCL 10 MG PO TABS
10.0000 mg | ORAL_TABLET | Freq: Once | ORAL | Status: AC
  Administered 2023-04-25: 10 mg via ORAL
  Filled 2023-04-25: qty 1

## 2023-04-25 MED ORDER — TIZANIDINE HCL 4 MG PO TABS
4.0000 mg | ORAL_TABLET | Freq: Four times a day (QID) | ORAL | 0 refills | Status: AC | PRN
  Filled 2023-04-25: qty 30, 8d supply, fill #0

## 2023-04-25 MED ORDER — TIZANIDINE HCL 4 MG PO TABS: 4.0000 mg | ORAL_TABLET | Freq: Four times a day (QID) | ORAL | 0 refills | Status: DC | PRN

## 2023-04-25 NOTE — ED Triage Notes (Signed)
 Pt via pov from home with left sided neck pain x 3 days. She states she thinks she slept on it wrong. Pt alert & oriented, nad noted.

## 2023-04-25 NOTE — Discharge Instructions (Addendum)
 It was a pleasure taking part in your care.  As we discussed, the imaging of your neck is unremarkable.  I suspect that you have muscular strain as a cause of your neck pain.  Please take muscle relaxer every 6 hours as needed for pain.  Please take ibuprofen or Tylenol every 6 hours as needed.  Please place 1 Lidoderm patch on the left side of your neck for pain.  Follow-up with your PCP for further management and care.  Return to the ED with any new or worsening symptoms.

## 2023-04-25 NOTE — ED Provider Notes (Signed)
 [place] EMERGENCY DEPARTMENT AT [facility] Provider Note   CSN: [id number] Arrival date & time: 04/25/23  1031     History  Chief Complaint  Patient presents with   Neck Pain    Stephanie Lloyd is a 53 y.o. female with medical history of diabetes, immune deficiency disorder, hypertension, CVA.  Patient presents to ED for evaluation of left-sided neck pain.  States that 2 days ago she woke up with left-sided neck pain.  Denies preceding trauma or event to account for this pain.  Denies history of left-sided neck pain.  Denies fevers, bodyaches or chills, photophobia.  Denies nausea or vomiting.  States that she has not taken any medications for her neck pain prior to coming to the ED.  Denies any numbness or weakness of her upper extremities.  No other concerns.  Reports pain is worsened when she moves her head to the left.   Neck Pain Associated symptoms: no fever and no photophobia        Home Medications Prior to Admission medications   Medication Sig Start Date End Date Taking? Authorizing Provider  ACCU-CHEK AVIVA PLUS test strip USE 1 STRIP TO CHECK GLUCOSE TWICE DAILY 07/28/18   Zola Button, Grayling Congress, DO  ACCU-CHEK SOFTCLIX LANCETS lancets Use as directed twice a day.  Dx Code: E11.9 01/31/17   Zola Button, Grayling Congress, DO  amLODipine (NORVASC) 10 MG tablet TAKE 1 TABLET BY MOUTH ONCE DAILY 06/09/17   Zola Button, Grayling Congress, DO  aspirin EC 325 MG EC tablet Take 1 tablet (325 mg total) by mouth daily. 03/02/16   Tillman Sers, DO  atorvastatin (LIPITOR) 80 MG tablet TAKE 1 TABLET BY MOUTH ONCE DAILY AT 6 PM 01/14/18   Zola Button, Myrene Buddy R, DO  fluticasone (FLONASE) 50 MCG/ACT nasal spray Place 2 sprays into both nostrils daily. 04/23/17   Seabron Spates R, DO  lidocaine (LIDODERM) 5 % Place 1 patch onto the skin daily. Remove & Discard patch within 12 hours or as directed by MD 04/25/23  Yes Al Decant, PA-C  linagliptin (TRADJENTA) 5 MG TABS tablet Take 1  tablet (5 mg total) by mouth daily. 02/16/18   Romero Belling, MD  lisinopril (PRINIVIL,ZESTRIL) 40 MG tablet TAKE 1 TABLET BY MOUTH ONCE DAILY 12/02/17   Zola Button, Myrene Buddy R, DO  pioglitazone (ACTOS) 15 MG tablet Take 1 tablet (15 mg total) by mouth daily. 09/30/17   Romero Belling, MD  prednisoLONE acetate (PRED FORTE) 1 % ophthalmic suspension Place 1 drop into both eyes 2 (two) times daily.     [provider]  solifenacin (VESICARE) 10 MG tablet Take 1 tablet (10 mg total) by mouth daily. 08/05/17   Donato Schultz, DO  tiZANidine (ZANAFLEX) 4 MG tablet Take 1 tablet (4 mg total) by mouth every 6 (six) hours as needed for muscle spasms. 04/25/23  Yes Al Decant, PA-C      Allergies    Metformin and related    Review of Systems   Review of Systems  Constitutional:  Negative for fever.  Eyes:  Negative for photophobia and visual disturbance.  Gastrointestinal:  Negative for nausea and vomiting.  Musculoskeletal:  Positive for neck pain. Negative for myalgias.  All other systems reviewed and are negative.   Physical Exam Updated Vital Signs BP (!) 176/98   Pulse 64   Temp 97.9 F (36.6 C)   Resp 18   Ht 5\' 10"  (1.778 m)  Wt 87.5 kg   SpO2 98%   BMI 27.69 kg/m  Physical Exam Vitals and nursing note reviewed.  Constitutional:      General: She is not in acute distress.    Appearance: She is well-developed.  HENT:     Head: Normocephalic and atraumatic.  Eyes:     Conjunctiva/sclera: Conjunctivae normal.  Neck:   Cardiovascular:     Rate and Rhythm: Normal rate and regular rhythm.     Heart sounds: No murmur heard. Pulmonary:     Effort: Pulmonary effort is normal. No respiratory distress.     Breath sounds: Normal breath sounds.  Abdominal:     Palpations: Abdomen is soft.     Tenderness: There is no abdominal tenderness.  Musculoskeletal:        General: No swelling.     Cervical back: Neck supple.  Skin:    General: Skin is warm and dry.      Capillary Refill: Capillary refill takes less than 2 seconds.  Neurological:     General: No focal deficit present.     Mental Status: She is alert.     GCS: GCS eye subscore is 4. GCS verbal subscore is 5. GCS motor subscore is 6.     Cranial Nerves: Cranial nerves 2-12 are intact. No cranial nerve deficit.     Sensory: Sensation is intact. No sensory deficit.     Motor: Motor function is intact. No weakness.     Coordination: Coordination is intact. Heel to [facility] Test normal.     Comments: No focal neurodeficits  Psychiatric:        Mood and Affect: Mood normal.     ED Results / Procedures / Treatments   Labs (all labs ordered are listed, but only abnormal results are displayed) Labs Reviewed - No data to display  EKG None  Radiology CT Cervical Spine Wo Contrast Result Date: 04/25/2023 CLINICAL DATA:  Left-sided neck pain for 3 days, possibly slept on it wrong. EXAM: CT CERVICAL SPINE WITHOUT CONTRAST TECHNIQUE: Multidetector CT imaging of the cervical spine was performed without intravenous contrast. Multiplanar CT image reconstructions were also generated. RADIATION DOSE REDUCTION: This exam was performed according to the departmental dose-optimization program which includes automated exposure control, adjustment of the mA and/or kV according to patient size and/or use of iterative reconstruction technique. COMPARISON:  None Available. FINDINGS: Alignment: Alignment is maintained. No listhesis. No facet subluxation or dislocation. Skull base and vertebrae: No compression fracture or displaced fracture in the cervical spine. No suspicious osseous lesion. Soft tissues and spinal canal: No prevertebral fluid or swelling. No visible canal hematoma. Disc levels: Intervertebral disc spaces are relatively maintained. Small disc bulges at multiple levels. No high-grade osseous spinal canal stenosis. No high-grade osseous foraminal stenosis. Upper chest: Negative. Other: None. IMPRESSION: No  acute fracture or traumatic malalignment of the cervical spine. Electronically Signed   By: Emily Filbert M.D.   On: 04/25/2023 13:32    Procedures Procedures   Medications Ordered in ED Medications  lidocaine (LIDODERM) 5 % 1 patch (1 patch Transdermal Patch Applied 04/25/23 1142)  ketorolac (TORADOL) 15 MG/ML injection 15 mg (15 mg Intramuscular Given 04/25/23 1142)  cyclobenzaprine (FLEXERIL) tablet 10 mg (10 mg Oral Given 04/25/23 1141)    ED Course/ Medical Decision Making/ A&P   Medical Decision Making Risk Prescription drug management.   53 year old female presents for evaluation.  Please see HPI for further details.  On examination patient is afebrile and nontachycardic.  Her lung sounds are clear bilaterally, she is not hypoxic.  Abdomen is soft and compressible.  Neurological examinations at baseline without focal neurodeficits.  She has tenderness to the left-sided portion of her neck near sternocleidomastoid.  She reports that this pain is worse with movement of her head to the left.  I suspect cervical strain.  Patient vital signs reassuring, no fevers, photophobia, bodyaches or chills or other symptoms to me be concerned about meningitis.  She has equal grip strength to her upper extremities bilaterally, denies numbness.  Patient provided with Toradol, Flexeril, lidocaine patch.  Patient reassessed and reports that her pain has not fully relieved or resolved, has decreased slightly.  Will image patient back with CT cervical spine.  CT imaging of patient cervical spine unremarkable.  On reassessment, patient reports that the medications that were administered to her do seem to be helping now.  Will send patient home with muscle relaxer.  Will have her follow-up with her PCP.  Will also send her home with lidocaine patches and advised her to take ibuprofen or Tylenol at home.  Return precautions provided and she voiced understanding.  Stable to discharge.   Final Clinical  Impression(s) / ED Diagnoses Final diagnoses:  Neck pain    Rx / DC Orders ED Discharge Orders          Ordered    tiZANidine (ZANAFLEX) 4 MG tablet  Every 6 hours PRN        04/25/23 1346    lidocaine (LIDODERM) 5 %  Every 24 hours        04/25/23 1346              Al Decant, [place] 04/25/23 1346    Durwin Glaze, MD 04/25/23 1510

## 2023-04-25 NOTE — ED Notes (Signed)
Reviewed discharge instructions, medications, and home care with pt. Pt verbalized understanding and had no further questions. Pt exited ED without complications.

## 2023-07-23 ENCOUNTER — Encounter: Payer: Self-pay | Admitting: Family

## 2023-07-30 ENCOUNTER — Encounter: Payer: Self-pay | Admitting: Family

## 2023-07-30 ENCOUNTER — Encounter: Admitting: Obstetrics and Gynecology

## 2023-08-20 ENCOUNTER — Other Ambulatory Visit (HOSPITAL_COMMUNITY)
Admission: RE | Admit: 2023-08-20 | Discharge: 2023-08-20 | Disposition: A | Discharge status: Home / Self Care | Source: Ambulatory Visit | Attending: Family Medicine | Admitting: Family Medicine

## 2023-08-20 ENCOUNTER — Ambulatory Visit: Admitting: Family Medicine

## 2023-08-20 ENCOUNTER — Encounter: Payer: Self-pay | Admitting: Family Medicine

## 2023-08-20 VITALS — BP 125/87 | HR 75 | Ht 70.0 in | Wt 197.3 lb

## 2023-08-20 DIAGNOSIS — Z01419 Encounter for gynecological examination (general) (routine) without abnormal findings: Secondary | ICD-10-CM | POA: Diagnosis not present

## 2023-08-20 DIAGNOSIS — Z1211 Encounter for screening for malignant neoplasm of colon: Secondary | ICD-10-CM

## 2023-08-20 DIAGNOSIS — Z1231 Encounter for screening mammogram for malignant neoplasm of breast: Secondary | ICD-10-CM

## 2023-08-20 DIAGNOSIS — Z1331 Encounter for screening for depression: Secondary | ICD-10-CM | POA: Diagnosis not present

## 2023-08-20 DIAGNOSIS — Z124 Encounter for screening for malignant neoplasm of cervix: Secondary | ICD-10-CM

## 2023-08-20 DIAGNOSIS — Z30432 Encounter for removal of intrauterine contraceptive device: Secondary | ICD-10-CM

## 2023-08-20 DIAGNOSIS — Z1151 Encounter for screening for human papillomavirus (HPV): Secondary | ICD-10-CM | POA: Diagnosis not present

## 2023-08-20 NOTE — Progress Notes (Unsigned)
 Pt presents for annual. Pt declines std testing. Pt scored 6 on PHQ-9. Needs a referral only to [facility]

## 2023-08-20 NOTE — Progress Notes (Unsigned)
 ANNUAL EXAM Patient name: Stephanie Lloyd MRN [id number]  Date of birth: 20-May-1970 Chief Complaint:   Annual Exam  History of Present Illness:   Stephanie Lloyd is a 53 y.o. G4P1011 {race:25618} female being seen today for a routine annual exam.  Current complaints: ***  No LMP recorded. (Menstrual status: Perimenopausal).   The pregnancy intention screening data noted above was reviewed. Potential methods of contraception were discussed. The patient elected to proceed with No data recorded.   Last pap ***. Results were: {Pap findings:25134}. H/O abnormal pap: {yes/yes***/no:23866} Last mammogram: ***. Results were: {normal, abnormal, n/a:23837}. Family h/o breast cancer: {yes***/no:23838} Last colonoscopy: ***. Results were: {normal, abnormal, n/a:23837}. Family h/o colorectal cancer: {yes***/no:23838}     08/20/2023    2:20 PM 05/21/2016   12:10 PM 05/14/2016   11:58 AM 04/05/2016   11:24 AM 08/19/2014    1:20 PM  Depression screen PHQ 2/9  Decreased Interest 0 0 0 0 0  Down, Depressed, Hopeless 0 0 0 0 0  PHQ - 2 Score 0 0 0 0 0  Altered sleeping 3      Tired, decreased energy 2      Change in appetite 0      Feeling bad or failure about yourself  1      Trouble concentrating 0      Moving slowly or fidgety/restless 0      Suicidal thoughts 0      PHQ-9 Score 6            08/20/2023    2:20 PM  GAD 7 : Generalized Anxiety Score  Nervous, Anxious, on Edge 0  Control/stop worrying 0  Worry too much - different things 0  Trouble relaxing 0  Restless 0  Easily annoyed or irritable 0  Afraid - awful might happen 0  Total GAD 7 Score 0     Review of Systems:   Pertinent items are noted in HPI Denies any headaches, blurred vision, fatigue, shortness of breath, chest pain, abdominal pain, abnormal vaginal discharge/itching/odor/irritation, problems with periods, bowel movements, urination, or intercourse unless otherwise stated above. Pertinent History Reviewed:   Reviewed past medical,surgical, social and family history.  Reviewed problem list, medications and allergies. Physical Assessment:   Vitals:   08/20/23 1411  BP: 125/87  Pulse: 75  Weight: 197 lb 4.8 oz (89.5 kg)  Height: 5' 10 (1.778 m)  Body mass index is 28.31 kg/m.        Physical Examination:   General appearance - well appearing, and in no distress  Mental status - alert, oriented to person, place, and time  Psych:  She has a normal mood and affect  Skin - warm and dry, normal color, no suspicious lesions noted  Chest - effort normal, all lung fields clear to auscultation bilaterally  Heart - normal rate and regular rhythm  Neck:  midline trachea, no thyromegaly or nodules  Breasts - breasts appear normal, no suspicious masses, no skin or nipple changes or  axillary nodes  Abdomen - soft, nontender, nondistended, no masses or organomegaly  Pelvic - VULVA: normal appearing vulva with no masses, tenderness or lesions  VAGINA: normal appearing vagina with normal color and discharge, no lesions  CERVIX: normal appearing cervix without discharge or lesions, no CMT  Thin prep pap is {Desc; done/not:10129} *** HR HPV cotesting  UTERUS: uterus is felt to be normal size, shape, consistency and nontender   ADNEXA: No adnexal masses or tenderness noted.  Rectal - normal rectal, good sphincter tone, no masses felt. Hemoccult: ***  Extremities:  No swelling or varicosities noted  Chaperone present for exam  No results found for this or any previous visit (from the past 24 hours).  Assessment & Plan:  1) Well-Woman Exam  2) ***  Labs/procedures today: ***  Mammogram: {Mammo f/u:25212::@ 55yo}, or sooner if problems Colonoscopy: {TCS f/u:25213::@ 55yo}, or sooner if problems  No orders of the defined types were placed in this encounter.   Meds: No orders of the defined types were placed in this encounter.   Follow-up: No follow-ups on file.  Alain Sor,  MD 08/20/2023 2:40 PM

## 2023-08-21 ENCOUNTER — Encounter: Payer: Self-pay | Admitting: Family Medicine

## 2023-08-26 ENCOUNTER — Encounter: Payer: Self-pay | Admitting: Psychology

## 2023-08-26 LAB — CYTOLOGY - PAP
Comment: NEGATIVE
Diagnosis: NEGATIVE
High risk HPV: NEGATIVE

## 2023-08-29 ENCOUNTER — Ambulatory Visit: Payer: Self-pay | Admitting: Family Medicine

## 2023-09-08 ENCOUNTER — Other Ambulatory Visit: Payer: Self-pay

## 2023-09-08 ENCOUNTER — Ambulatory Visit: Attending: Family Medicine

## 2023-09-08 DIAGNOSIS — M6281 Muscle weakness (generalized): Secondary | ICD-10-CM | POA: Diagnosis present

## 2023-09-08 DIAGNOSIS — R262 Difficulty in walking, not elsewhere classified: Secondary | ICD-10-CM | POA: Diagnosis present

## 2023-09-08 DIAGNOSIS — R2689 Other abnormalities of gait and mobility: Secondary | ICD-10-CM | POA: Insufficient documentation

## 2023-09-08 DIAGNOSIS — R2681 Unsteadiness on feet: Secondary | ICD-10-CM | POA: Diagnosis present

## 2023-09-08 NOTE — Therapy (Signed)
 OUTPATIENT PHYSICAL THERAPY NEURO EVALUATION   Patient Name: Stephanie Lloyd MRN: [id number] DOB:[birth year], 53 y.o., female Today's Date: 09/09/2023   PCP: no PCP REFERRING PROVIDER: Beam, Lamar POUR, MD  END OF SESSION:  PT End of Session - 09/08/23 1529     Visit Number 1    Number of Visits 13    Date for PT Re-Evaluation 10/21/23    Authorization Type [employer]    Progress Note Due on Visit 10    PT Start Time 1530    PT Stop Time 1615    PT Time Calculation (min) 45 min          Past Medical History:  Diagnosis Date   Diabetes (HCC)    Diabetes mellitus without complication (HCC)    Diastolic dysfunction    Hypertension    Immune deficiency disorder (HCC)    Menometrorrhagia 04/01/2016   Stroke [facility])    Past Surgical History:  Procedure Laterality Date   NO PAST SURGERIES     Patient Active Problem List   Diagnosis Date Noted   Memory deficit after cerebral infarction 12/27/2019   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 12/27/2019   Urge incontinence 04/06/2018   Systolic murmur 04/06/2018   Excessive daytime sleepiness 08/10/2017   Fatigue 08/10/2017   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) 07/08/2017   Snoring 07/08/2017   Cognitive decline 07/08/2017   Facial droop 04/18/2017   Stage 3a chronic kidney disease (HCC) 04/18/2017   Anxiety 01/16/2017   Leg cramps 01/16/2017   Diverticulosis 08/29/2016   Diverticulitis 08/29/2016   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) 08/02/2016   OSA (obstructive sleep apnea) 06/10/2016   Iron deficiency anemia due to chronic blood loss 04/01/2016   Menometrorrhagia 04/01/2016   History of stroke    History of intracerebral hemorrhage without residual deficit    Cerebrovascular accident (CVA) due to thrombosis of basilar artery (HCC) 02/28/2016   Acute encephalopathy 10/28/2015    Diastolic dysfunction    Stroke (cerebrum) (HCC)    Focal infarction of brain (HCC) 10/08/2015   Syncope 08/19/2015   Hypokalemia 07/28/2015   Accelerated hypertension 09/21/2014   Type 2 diabetes mellitus with other circulatory complications (HCC) 09/21/2014   Thalamic hemorrhage with stroke (HCC) 07/29/2014   Hyperlipidemia LDL goal <70 07/29/2014   Hemorrhagic stroke (HCC) 06/22/2014   ICH (intracerebral hemorrhage) (HCC) 06/22/2014   Essential hypertension 05/13/2012    ONSET DATE: reports most recent CVA 2019  REFERRING DIAG:  I67.850 (ICD-10-CM) - 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  Notes her strokes have affected her in numerous places physically and mentally.  Notes feeling of general weakness affecting her extremities. Reports occasional numbness/tingling in toes/feet. Denies any issues of dizziness. Note balance issues requiring her to hold furniture for support. Not currently using AD. 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 No  PRECAUTIONS: None  RED FLAGS: None   WEIGHT BEARING RESTRICTIONS: No  FALLS: Has patient fallen in last 6 months? No  LIVING  ENVIRONMENT: Lives with: lives with their family Lives in: House/apartment Stairs: No Has following equipment at home: Single point cane, Environmental consultant - 2 wheeled, shower chair, and Grab bars  PLOF: Independent with basic ADLs and Independent with household mobility without device  PATIENT GOALS:   OBJECTIVE:  Note: Objective measures were completed at Evaluation unless otherwise noted.  DIAGNOSTIC FINDINGS:   COGNITION: Overall cognitive status: good short term recall to 3 items.  Notes deficits to memory.    SENSATION: Not tested  COORDINATION: Difficulty w/ rapid alternating movements Heel to shin WNL Finger to nose moderately impaired LUE > RUE  EDEMA:  None noted  MUSCLE TONE: general hypotonia/deconditioning  MUSCLE LENGTH: WNL    POSTURE: bilat genu valgum  LOWER EXTREMITY ROM:     Active  Right Eval Left Eval  Hip flexion    Hip extension    Hip abduction    Hip adduction    Hip internal rotation    Hip external rotation    Knee flexion    Knee extension    Ankle dorsiflexion 8 14  Ankle plantarflexion    Ankle inversion    Ankle eversion     (Blank rows = not tested)  LOWER EXTREMITY MMT:    MMT Right Eval Left Eval  Hip flexion 3 4  Hip extension    Hip abduction 3- 3  Hip adduction 4 4  Hip internal rotation    Hip external rotation    Knee flexion 4- 4  Knee extension 4- 4  Ankle dorsiflexion 4+ 4+  Ankle plantarflexion 2+ 2+  Ankle inversion    Ankle eversion    (Blank rows = not tested)  BED MOBILITY:  Not tested--reports occasional need for assistance from spouse for supine to sit  TRANSFERS: Sit to stand: Modified independence  Assistive device utilized: None     Stand to sit: Complete Independence  Assistive device utilized: None     Chair to chair: Modified independence  Assistive device utilized: None      Floor: NT  Assistive device utilized: None         CURB:  Findings: CGA  STAIRS: Findings: Level of  Assistance: Modified independence and Stair Negotiation Technique: Step to Pattern with Single Rail on Right GAIT: Findings: Gait Characteristics: decreased stride length and lateral lean- Right  FUNCTIONAL TESTS:  5 times sit to stand: unable Timed up and go (TUG):   Berg Balance Test: 45/56 Dynamic Gait Index: 9/24 10 meter walk test: 1.7 ft/sec, no AD    M-CTSIB  Condition 1: Firm Surface, EO 30 Sec, Mild Sway  Condition 2: Firm Surface, EC 30 Sec, Moderate Sway  Condition 3: Foam Surface, EO 30 Sec, Mild Sway  Condition 4: Foam  Surface, EC 30 Sec, Moderate and Severe Sway    PATIENT SURVEYS:                                                                                                                                TREATMENT DATE: evaluation, no tx initiated yet    PATIENT EDUCATION: Education details: assessment details, rationale of PT intervention Person educated: Patient Education method: Explanation Education comprehension: verbalized understanding  HOME EXERCISE PROGRAM: TBD  GOALS: Goals reviewed with patient? Yes  SHORT TERM GOALS: Target date: 09/30/2023    Patient will be independent in HEP to improve functional outcomes Baseline: Goal status: INITIAL  2.  Demo improved balance for safety during ADL per score 49/56 Berg Balance Test Baseline: 45/56 Goal status: INITIAL  3.  Demo increased gait speed to 2.0 ft/sec for improved efficiency of community mobility Baseline: 1.7 ft/sec, no AD Goal status: INITIAL    LONG TERM GOALS: Target date: 10/21/2023    Demo improved BLE strength and reduced risk for falls per time 30 sec 5xSTS test, without need for UE support Baseline: unable Goal status: INITIAL  2.  Demo improved safety with ambulation and reduced risk for falls per score 17/24 Dynamic Gait Index Baseline: 9/24 Goal status: INITIAL  3.  Increase BLE strength by 1-grade for improved mobility Baseline: see chart Goal status:  INITIAL  4.  Pt to teach-back relevant activities and progressions for gym-based activities Baseline: expresses interest in attending [employer] Goal status: INITIAL   ASSESSMENT:  CLINICAL IMPRESSION: Patient is a 53 y.o. 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  Exhibits generalized weakness, balance deficits, gait deviations, high risk for falls per outcome measures, and reduced functional activity tolerance.  Pt would benefit from PT services to address deficits and limitations to improve functional mobility and reduce risk for falls.    OBJECTIVE IMPAIRMENTS: Abnormal gait, decreased activity tolerance, decreased balance, decreased coordination, decreased endurance, decreased knowledge of use of DME, decreased mobility, difficulty walking, and decreased strength.   ACTIVITY LIMITATIONS: carrying, lifting, bending, standing, squatting, stairs, transfers, bed mobility, reach over head, and locomotion level  PARTICIPATION LIMITATIONS: meal prep, cleaning, laundry, shopping, community activity, and fitness regimen  PERSONAL FACTORS: Age, Time since onset of injury/illness/exacerbation, and 1-2 comorbidities: PMH and hx of multiple CVA are also affecting patient's functional outcome.   REHAB POTENTIAL: Good  CLINICAL DECISION MAKING: Evolving/moderate complexity  EVALUATION COMPLEXITY: Moderate  PLAN:  PT FREQUENCY: 2x/week  PT DURATION: 6 weeks  PLANNED INTERVENTIONS: 97750- Physical Performance Testing, 97110-Therapeutic exercises, 97530- Therapeutic activity, [id number]- Neuromuscular re-education, 97535- Self Care, 02859- Manual therapy, [id number]- Gait training, [phone]- Aquatic Therapy, and [phone]- Electrical stimulation (unattended)  PLAN FOR NEXT SESSION: establish HEP for BLE strength   9:55 AM, 09/09/23 M. Kelly Maggy Wyble, PT, DPT Physical Therapist-  [place] Office Number: [phone]

## 2023-09-09 ENCOUNTER — Telehealth: Payer: Self-pay

## 2023-09-09 NOTE — Telephone Encounter (Signed)
 Dr. Dorcus, Antoine Mocha was evaluated by PT on 09/08/23.  The patient would benefit from Speech Therapy evaluation for complaints of memory and cognitive issues.   If you agree, please place an order in [facility] in [facility] or fax the order to [phone]. Thank you, 10:00 AM, 09/09/23 M. Kelly Melisia Leming, PT, DPT Physical Therapist- [place] Office Number: [phone]   [facility], [facility] [address] [place], [place]  [address] Phone:  [phone] Fax:  [phone]

## 2023-09-16 ENCOUNTER — Ambulatory Visit: Admitting: Physical Therapy

## 2023-09-16 NOTE — Therapy (Deleted)
 OUTPATIENT PHYSICAL THERAPY NEURO EVALUATION   Patient Name: Stephanie Lloyd MRN: [id number] DOB:Jun 12, 1970, 53 y.o., female Today's Date: 09/16/2023   PCP: no PCP REFERRING PROVIDER: Beam, Lamar POUR, MD  END OF SESSION:    Past Medical History:  Diagnosis Date   Diabetes (HCC)    Diabetes mellitus without complication (HCC)    Diastolic dysfunction    Hypertension    Immune deficiency disorder (HCC)    Menometrorrhagia 04/01/2016   Stroke [facility])    Past Surgical History:  Procedure Laterality Date   NO PAST SURGERIES     Patient Active Problem List   Diagnosis Date Noted   Memory deficit after cerebral infarction 12/27/2019   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 12/27/2019   Urge incontinence 04/06/2018   Systolic murmur 04/06/2018   Excessive daytime sleepiness 08/10/2017   Fatigue 08/10/2017   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) 07/08/2017   Snoring 07/08/2017   Cognitive decline 07/08/2017   Facial droop 04/18/2017   Stage 3a chronic kidney disease (HCC) 04/18/2017   Anxiety 01/16/2017   Leg cramps 01/16/2017   Diverticulosis 08/29/2016   Diverticulitis 08/29/2016   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) 08/02/2016   OSA (obstructive sleep apnea) 06/10/2016   Iron deficiency anemia due to chronic blood loss 04/01/2016   Menometrorrhagia 04/01/2016   History of stroke    History of intracerebral hemorrhage without residual deficit    Cerebrovascular accident (CVA) due to thrombosis of basilar artery (HCC) 02/28/2016   Acute encephalopathy 10/28/2015   Diastolic dysfunction    Stroke (cerebrum) (HCC)    Focal infarction of brain (HCC) 10/08/2015   Syncope 08/19/2015   Hypokalemia 07/28/2015   Accelerated hypertension 09/21/2014   Type 2 diabetes mellitus with other circulatory complications (HCC) 09/21/2014   Thalamic  hemorrhage with stroke (HCC) 07/29/2014   Hyperlipidemia LDL goal <70 07/29/2014   Hemorrhagic stroke (HCC) 06/22/2014   ICH (intracerebral hemorrhage) (HCC) 06/22/2014   Essential hypertension 05/13/2012    ONSET DATE: reports most recent CVA 2019  REFERRING DIAG:  I67.850 (ICD-10-CM) - 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  Rationale for Evaluation and Treatment: Rehabilitation  SUBJECTIVE:                                                                                                                                                                                             SUBJECTIVE STATEMENT: ***  From eval: Has had 7 strokes in lifetime with most recent in 2019.  Notes her strokes have affected her in numerous places physically and mentally.  Notes feeling of general weakness affecting her extremities. Reports occasional numbness/tingling in toes/feet. Denies any issues of dizziness. Note balance issues requiring her to hold furniture for support. Not currently using AD. 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 No  PRECAUTIONS: None  RED FLAGS: None   WEIGHT BEARING RESTRICTIONS: No  FALLS: Has patient fallen in last 6 months? No  LIVING ENVIRONMENT: Lives with: lives with their family Lives in: House/apartment Stairs: No Has following equipment at home: Single point cane, Environmental consultant - 2 wheeled, shower chair, and Grab bars  PLOF: Independent with basic ADLs and Independent with household mobility without device  PATIENT GOALS:   OBJECTIVE:  Note: Objective measures were completed at Evaluation unless otherwise  noted.  TREATMENT DATE: 09/16/23 Activity Comment  ***             PATIENT EDUCATION: Education details: HEP Person educated: Patient Education method: Explanation Education comprehension: verbalized understanding  HOME EXERCISE PROGRAM: ***  DIAGNOSTIC FINDINGS:   COGNITION: Overall cognitive status: good short term recall to 3 items.  Notes deficits to memory.    SENSATION: Not tested  COORDINATION: Difficulty w/ rapid alternating movements Heel to shin WNL Finger to nose moderately impaired LUE > RUE  EDEMA:  None noted  MUSCLE TONE: general hypotonia/deconditioning  MUSCLE LENGTH: WNL    POSTURE: bilat genu valgum  LOWER EXTREMITY ROM:     Active  Right Eval Left Eval  Hip flexion    Hip extension    Hip abduction    Hip adduction    Hip internal rotation    Hip external rotation    Knee flexion    Knee extension    Ankle dorsiflexion 8 14  Ankle plantarflexion    Ankle inversion    Ankle eversion     (Blank rows = not tested)  LOWER EXTREMITY MMT:    MMT Right Eval Left Eval  Hip flexion 3 4  Hip extension    Hip abduction 3- 3  Hip adduction 4 4  Hip internal rotation    Hip external rotation    Knee flexion 4- 4  Knee extension 4- 4  Ankle dorsiflexion 4+ 4+  Ankle plantarflexion 2+ 2+  Ankle inversion    Ankle eversion    (Blank rows = not tested)  BED MOBILITY:  Not tested--reports occasional need for assistance from spouse for supine to sit  TRANSFERS: Sit to stand: Modified independence  Assistive device utilized: None     Stand to sit: Complete Independence  Assistive device utilized: None     Chair to chair: Modified independence  Assistive device utilized: None      Floor: NT  Assistive device utilized: None         CURB:  Findings: CGA  STAIRS: Findings: Level of Assistance: Modified independence and Stair Negotiation Technique: Step to Pattern with Single Rail on Right GAIT: Findings: Gait  Characteristics: decreased stride length and lateral lean- Right  FUNCTIONAL TESTS:  5 times sit to stand: unable Timed up and go (TUG):   Berg Balance Test: 45/56 Dynamic Gait Index: 9/24 10 meter walk test: 1.7 ft/sec, no AD    M-CTSIB  Condition 1: Firm Surface, EO 30 Sec,  Mild Sway  Condition 2: Firm Surface, EC 30 Sec, Moderate Sway  Condition 3: Foam Surface, EO 30 Sec, Mild Sway  Condition 4: Foam Surface, EC 30 Sec, Moderate and Severe Sway    PATIENT SURVEYS:                                                                                                                                  GOALS: Goals reviewed with patient? Yes  SHORT TERM GOALS: Target date: 09/30/2023    Patient will be independent in HEP to improve functional outcomes Baseline: Goal status: INITIAL  2.  Demo improved balance for safety during ADL per score 49/56 Berg Balance Test Baseline: 45/56 Goal status: INITIAL  3.  Demo increased gait speed to 2.0 ft/sec for improved efficiency of community mobility Baseline: 1.7 ft/sec, no AD Goal status: INITIAL    LONG TERM GOALS: Target date: 10/21/2023    Demo improved BLE strength and reduced risk for falls per time 30 sec 5xSTS test, without need for UE support Baseline: unable Goal status: INITIAL  2.  Demo improved safety with ambulation and reduced risk for falls per score 17/24 Dynamic Gait Index Baseline: 9/24 Goal status: INITIAL  3.  Increase BLE strength by 1-grade for improved mobility Baseline: see chart Goal status: INITIAL  4.  Pt to teach-back relevant activities and progressions for gym-based activities Baseline: expresses interest in attending [employer] Goal status: INITIAL   ASSESSMENT:  CLINICAL IMPRESSION: ***  From eval: Patient is a 53 y.o. 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   Exhibits generalized weakness, balance deficits, gait deviations, high risk for falls per outcome measures, and reduced functional activity tolerance.  Pt would benefit from PT services to address deficits and limitations to improve functional mobility and reduce risk for falls.    OBJECTIVE IMPAIRMENTS: Abnormal gait, decreased activity tolerance, decreased balance, decreased coordination, decreased endurance, decreased knowledge of use of DME, decreased mobility, difficulty walking, and decreased strength.   ACTIVITY LIMITATIONS: carrying, lifting, bending, standing, squatting, stairs, transfers, bed mobility, reach over head, and locomotion level  PARTICIPATION LIMITATIONS: meal prep, cleaning, laundry, shopping, community activity, and fitness regimen  PERSONAL FACTORS: Age, Time since onset of injury/illness/exacerbation, and 1-2 comorbidities: PMH and hx of multiple CVA are also affecting patient's functional outcome.   REHAB POTENTIAL: Good  CLINICAL DECISION MAKING: Evolving/moderate complexity  EVALUATION COMPLEXITY: Moderate  PLAN:  PT FREQUENCY: 2x/week  PT DURATION: 6 weeks  PLANNED INTERVENTIONS: 97750- Physical Performance Testing, 97110-Therapeutic exercises, 97530- Therapeutic activity, [id number]- Neuromuscular re-education, 97535- Self Care, 02859- Manual therapy, [phone]- Gait training, [phone]- Aquatic Therapy, and [phone]- Electrical stimulation (unattended)  PLAN FOR NEXT SESSION: establish HEP for BLE strength   12:51 PM, 09/16/23 Thoms Barthelemy April Ma L Dekayla Prestridge, PT, DPT Physical Therapist-  Office Number: [phone]

## 2023-09-18 ENCOUNTER — Ambulatory Visit: Admitting: Physical Therapy

## 2023-09-18 DIAGNOSIS — M6281 Muscle weakness (generalized): Secondary | ICD-10-CM | POA: Diagnosis not present

## 2023-09-18 DIAGNOSIS — R2681 Unsteadiness on feet: Secondary | ICD-10-CM

## 2023-09-18 DIAGNOSIS — R2689 Other abnormalities of gait and mobility: Secondary | ICD-10-CM

## 2023-09-18 NOTE — Therapy (Signed)
 OUTPATIENT PHYSICAL THERAPY NEURO TREATMENT NOTE   Patient Name: Stephanie Lloyd MRN: [id number] DOB:05/08/1970, 53 y.o., female Today's Date: 09/18/2023   PCP: no PCP REFERRING PROVIDER: Beam, Lamar POUR, MD  END OF SESSION:  PT End of Session - 09/18/23 1404     Visit Number 2    Number of Visits 13    Date for PT Re-Evaluation 10/21/23    Authorization Type [employer]    Progress Note Due on Visit 10    PT Start Time 1404    PT Stop Time 1442    PT Time Calculation (min) 38 min    Activity Tolerance Patient tolerated treatment well    Behavior During Therapy WFL for tasks assessed/performed           Past Medical History:  Diagnosis Date   Diabetes (HCC)    Diabetes mellitus without complication (HCC)    Diastolic dysfunction    Hypertension    Immune deficiency disorder (HCC)    Menometrorrhagia 04/01/2016   Stroke [facility])    Past Surgical History:  Procedure Laterality Date   NO PAST SURGERIES     Patient Active Problem List   Diagnosis Date Noted   Memory deficit after cerebral infarction 12/27/2019   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 12/27/2019   Urge incontinence 04/06/2018   Systolic murmur 04/06/2018   Excessive daytime sleepiness 08/10/2017   Fatigue 08/10/2017   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) 07/08/2017   Snoring 07/08/2017   Cognitive decline 07/08/2017   Facial droop 04/18/2017   Stage 3a chronic kidney disease (HCC) 04/18/2017   Anxiety 01/16/2017   Leg cramps 01/16/2017   Diverticulosis 08/29/2016   Diverticulitis 08/29/2016   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) 08/02/2016   OSA (obstructive sleep apnea) 06/10/2016   Iron deficiency anemia due to chronic blood loss 04/01/2016   Menometrorrhagia 04/01/2016   History of stroke    History of intracerebral hemorrhage without residual deficit     Cerebrovascular accident (CVA) due to thrombosis of basilar artery (HCC) 02/28/2016   Acute encephalopathy 10/28/2015   Diastolic dysfunction    Stroke (cerebrum) (HCC)    Focal infarction of brain (HCC) 10/08/2015   Syncope 08/19/2015   Hypokalemia 07/28/2015   Accelerated hypertension 09/21/2014   Type 2 diabetes mellitus with other circulatory complications (HCC) 09/21/2014   Thalamic hemorrhage with stroke (HCC) 07/29/2014   Hyperlipidemia LDL goal <70 07/29/2014   Hemorrhagic stroke (HCC) 06/22/2014   ICH (intracerebral hemorrhage) (HCC) 06/22/2014   Essential hypertension 05/13/2012    ONSET DATE: reports most recent CVA 2019  REFERRING DIAG:  I67.850 (ICD-10-CM) - 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  Will not be able to continue therapy due to financial concerns.  Would like today to be my last day  From eval: Has had 7 strokes in lifetime with most recent in 2019.  Notes her strokes have affected her in numerous places physically and mentally.  Notes feeling of general weakness affecting her extremities. Reports occasional numbness/tingling in toes/feet. Denies any issues of dizziness. Note balance issues requiring her to hold furniture for support. Not currently using AD. Reports limited physical activity primarily household ambulator and largely sedentary Right hand dominant Pt accompanied by: self  PERTINENT HISTORY: Cerebral autosomal dominant arteriopathy with subcortical infarcts  and leukoencephalopathy, DM, hx of CVA x 7  PAIN:  Are you having pain? No  PRECAUTIONS: None  RED FLAGS: None   WEIGHT BEARING RESTRICTIONS: No  FALLS: Has patient fallen in last 6 months? No  LIVING ENVIRONMENT: Lives with: lives with their family Lives in: House/apartment Stairs: No Has following equipment at home: Single point cane, Environmental consultant - 2 wheeled, shower chair, and Grab bars  PLOF: Independent with basic ADLs and Independent with household mobility without device  PATIENT GOALS:   OBJECTIVE:  Note: Objective measures were completed at Evaluation unless otherwise noted.  TREATMENT DATE: 09/18/2023 Activity Comment  Initiated HEP-see below Performed 1-2 sets of 10 of each exercise for strength, 2 reps sidestep and backward walk, 2 x 10 step taps  Gait with 4WW x 100 ft Cues for heel strike with gait-discussed how she may obtain a 4WW for home/community use         PATIENT EDUCATION: Education details: HEP initiated and discussed ways she can progress; discussed POC and that we will keep her chart open through end of POC if she feels that she may need to return during this time for progression of HEP Person educated: Patient Education method: Explanation Education comprehension: verbalized understanding  HOME EXERCISE PROGRAM: Access Code: [email or web address] URL: [email or web address] Date: 09/18/2023 Prepared by: [facility] - Outpatient  Rehab - [facility]  Exercises - Seated Hip Abduction with Resistance  - 1 x daily - 5 x weekly - 3 sets - 10 reps - Seated March with Resistance  - 1 x daily - 5 x weekly - 3 sets - 10 reps - Sitting Knee Extension with Resistance  - 1 x daily - 5 x weekly - 3 sets - 10 reps - Heel Toe Raises with Counter Support  - 1 x daily - 5 x weekly - 3 sets - 10 reps - Sit to Stand  - 1 x daily - 5 x weekly - 3 sets - 5 reps - Side Stepping with Counter Support  - 1 x daily - 5 x weekly - 1 sets - 3-5 reps - Backward  Walking with Counter Support  - 1 x daily - 5 x weekly - 1 sets - 3-5 reps - Alternating Step Taps with Counter Support  - 1 x daily - 5 x weekly - 3 sets - 10 reps ----------------------------------------------------------------------- DIAGNOSTIC FINDINGS:   COGNITION: Overall cognitive status: good short term recall to 3 items.  Notes deficits to memory.    SENSATION: Not tested  COORDINATION: Difficulty w/ rapid alternating movements Heel to shin WNL Finger to nose moderately impaired LUE > RUE  EDEMA:  None noted  MUSCLE TONE: general hypotonia/deconditioning  MUSCLE LENGTH: WNL    POSTURE: bilat genu valgum  LOWER EXTREMITY ROM:     Active  Right Eval Left Eval  Hip flexion    Hip extension    Hip abduction    Hip adduction    Hip internal rotation    Hip external rotation    Knee flexion    Knee extension    Ankle dorsiflexion 8 14  Ankle plantarflexion    Ankle inversion    Ankle eversion     (Blank rows = not tested)  LOWER EXTREMITY MMT:    MMT Right Eval Left Eval  Hip flexion 3 4  Hip extension    Hip abduction 3- 3  Hip adduction 4 4  Hip internal rotation    Hip external rotation    Knee flexion 4- 4  Knee extension 4- 4  Ankle dorsiflexion 4+ 4+  Ankle plantarflexion 2+ 2+  Ankle inversion    Ankle eversion    (Blank rows = not tested)  BED MOBILITY:  Not tested--reports occasional need for assistance from spouse for supine to sit  TRANSFERS: Sit to stand: Modified independence  Assistive device utilized: None     Stand to sit: Complete Independence  Assistive device utilized: None     Chair to chair: Modified independence  Assistive device utilized: None      Floor: NT  Assistive device utilized: None         CURB:  Findings: CGA  STAIRS: Findings: Level of Assistance: Modified independence and Stair Negotiation Technique: Step to Pattern with Single Rail on Right GAIT: Findings: Gait Characteristics: decreased stride  length and lateral lean- Right  FUNCTIONAL TESTS:  5 times sit to stand: unable Timed up and go (TUG):   Berg Balance Test: 45/56 Dynamic Gait Index: 9/24 10 meter walk test: 1.7 ft/sec, no AD    M-CTSIB  Condition 1: Firm Surface, EO 30 Sec, Mild Sway  Condition 2: Firm Surface, EC 30 Sec, Moderate Sway  Condition 3: Foam Surface, EO 30 Sec, Mild Sway  Condition 4: Foam Surface, EC 30 Sec, Moderate and Severe Sway    PATIENT SURVEYS:                                                                                                                                  GOALS: Goals reviewed with patient? Yes  SHORT TERM GOALS: Target date: 09/30/2023    Patient will be independent in HEP to improve functional outcomes Baseline: Goal status: INITIAL  2.  Demo improved balance for safety during ADL per score 49/56 Berg Balance Test Baseline: 45/56 Goal status: INITIAL  3.  Demo increased gait speed to 2.0 ft/sec for improved efficiency of community mobility Baseline: 1.7 ft/sec, no AD Goal status: INITIAL    LONG TERM GOALS: Target date: 10/21/2023    Demo improved BLE strength and reduced risk for falls per time 30 sec 5xSTS test, without need for UE support Baseline: unable Goal status: INITIAL  2.  Demo improved safety with ambulation and reduced  risk for falls per score 17/24 Dynamic Gait Index Baseline: 9/24 Goal status: INITIAL  3.  Increase BLE strength by 1-grade for improved mobility Baseline: see chart Goal status: INITIAL  4.  Pt to teach-back relevant activities and progressions for gym-based activities Baseline: expresses interest in attending [employer] Goal status: INITIAL   ASSESSMENT:  CLINICAL IMPRESSION: Pt presents today and states at beginning of session that she will not be able to continue PT due to financial concerns; discussed various options of reduced frequency, and ultimately, pt is thinking today would be her last day.  Worked to  initiate HEP to address lower extremity strengthening as well as balance.  She return demo good understanding.  Did short bout of gait with rollator, and pt reports she would like to get one for longer distance gait, which PT agrees would be beneficial.  Goals not assessed formally, as this is only her first visit since eval.   OBJECTIVE IMPAIRMENTS: Abnormal gait, decreased activity tolerance, decreased balance, decreased coordination, decreased endurance, decreased knowledge of use of DME, decreased mobility, difficulty walking, and decreased strength.   ACTIVITY LIMITATIONS: carrying, lifting, bending, standing, squatting, stairs, transfers, bed mobility, reach over head, and locomotion level  PARTICIPATION LIMITATIONS: meal prep, cleaning, laundry, shopping, community activity, and fitness regimen  PERSONAL FACTORS: Age, Time since onset of injury/illness/exacerbation, and 1-2 comorbidities: PMH and hx of multiple CVA are also affecting patient's functional outcome.   REHAB POTENTIAL: Good  CLINICAL DECISION MAKING: Evolving/moderate complexity  EVALUATION COMPLEXITY: Moderate  PLAN:  PT FREQUENCY: 2x/week  PT DURATION: 6 weeks  PLANNED INTERVENTIONS: 97750- Physical Performance Testing, 97110-Therapeutic exercises, 97530- Therapeutic activity, [id number]- Neuromuscular re-education, 97535- Self Care, [id number]- Manual therapy, [id number]- Gait training, [phone]- Aquatic Therapy, and [phone]- Electrical stimulation (unattended)  PLAN FOR NEXT SESSION: Pt wants to d/c today.  She is agreeable to hold chart open through end of POC (9/23) in case she does come back in for upgrade of HEP; will discharge at that time if pt does not return.  Greig Anon, PT 09/18/23 3:40 PM Phone: [phone] Fax: [phone]  [facility] at [facility] [address] [place], [address] [place], [place] [address] Phone # [phone] Fax # [phone]

## 2023-09-22 ENCOUNTER — Ambulatory Visit: Admitting: Physical Therapy

## 2023-09-23 ENCOUNTER — Ambulatory Visit: Admitting: Physical Therapy

## 2023-09-24 ENCOUNTER — Ambulatory Visit: Admitting: Physical Therapy

## 2023-09-25 ENCOUNTER — Ambulatory Visit: Admitting: Physical Therapy

## 2023-09-30 ENCOUNTER — Ambulatory Visit: Admitting: Physical Therapy

## 2023-10-02 ENCOUNTER — Ambulatory Visit: Admitting: Physical Therapy

## 2023-10-06 ENCOUNTER — Ambulatory Visit: Admitting: Physical Therapy

## 2023-10-07 ENCOUNTER — Encounter: Attending: Psychology | Admitting: Psychology

## 2023-10-08 ENCOUNTER — Ambulatory Visit: Admitting: Physical Therapy

## 2023-10-13 ENCOUNTER — Ambulatory Visit: Admitting: Physical Therapy

## 2023-10-15 ENCOUNTER — Ambulatory Visit: Admitting: Physical Therapy

## 2023-10-20 ENCOUNTER — Ambulatory Visit: Admitting: Physical Therapy

## 2023-10-22 ENCOUNTER — Ambulatory Visit: Admitting: Physical Therapy
